# Patient Record
Sex: Female | Born: 1965 | Race: White | Hispanic: No | Smoking: Former smoker
Health system: Southern US, Community
[De-identification: ages and names within clinical notes are randomized; demographics above are authoritative.]

## PROBLEM LIST (undated history)

## (undated) DIAGNOSIS — I10 Essential (primary) hypertension: Secondary | ICD-10-CM

## (undated) DIAGNOSIS — K589 Irritable bowel syndrome without diarrhea: Secondary | ICD-10-CM

## (undated) DIAGNOSIS — M79603 Pain in arm, unspecified: Secondary | ICD-10-CM

## (undated) DIAGNOSIS — M797 Fibromyalgia: Secondary | ICD-10-CM

## (undated) DIAGNOSIS — E785 Hyperlipidemia, unspecified: Secondary | ICD-10-CM

## (undated) DIAGNOSIS — IMO0002 Reserved for concepts with insufficient information to code with codable children: Secondary | ICD-10-CM

## (undated) HISTORY — DX: Essential (primary) hypertension: I10

## (undated) HISTORY — PX: CHOLECYSTECTOMY: SHX55

## (undated) HISTORY — PX: CYST EXCISION: SHX5701

## (undated) HISTORY — DX: Hyperlipidemia, unspecified: E78.5

## (undated) HISTORY — PX: ABDOMINAL SURGERY: SHX537

## (undated) HISTORY — DX: Fibromyalgia: M79.7

## (undated) HISTORY — PX: OVARY SURGERY: SHX727

## (undated) HISTORY — PX: ABDOMINAL HYSTERECTOMY: SHX81

## (undated) HISTORY — DX: Reserved for concepts with insufficient information to code with codable children: IMO0002

## (undated) HISTORY — DX: Irritable bowel syndrome, unspecified: K58.9

## (undated) HISTORY — PX: APPENDECTOMY: SHX54

## (undated) HISTORY — DX: Pain in arm, unspecified: M79.603

---

## 1997-09-01 ENCOUNTER — Other Ambulatory Visit: Admission: RE | Admit: 1997-09-01 | Discharge: 1997-09-01 | Payer: Self-pay | Admitting: Obstetrics & Gynecology

## 1999-03-07 ENCOUNTER — Other Ambulatory Visit: Admission: RE | Admit: 1999-03-07 | Discharge: 1999-03-07 | Payer: Self-pay | Admitting: Obstetrics and Gynecology

## 1999-08-27 ENCOUNTER — Encounter: Payer: Self-pay | Admitting: Internal Medicine

## 1999-08-27 ENCOUNTER — Ambulatory Visit (HOSPITAL_COMMUNITY): Admission: RE | Admit: 1999-08-27 | Discharge: 1999-08-27 | Payer: Self-pay | Admitting: Internal Medicine

## 2000-01-21 ENCOUNTER — Encounter: Admission: RE | Admit: 2000-01-21 | Discharge: 2000-01-21 | Payer: Self-pay | Admitting: Internal Medicine

## 2000-01-21 ENCOUNTER — Encounter: Payer: Self-pay | Admitting: Internal Medicine

## 2000-01-24 ENCOUNTER — Encounter: Payer: Self-pay | Admitting: Internal Medicine

## 2000-01-24 ENCOUNTER — Inpatient Hospital Stay (HOSPITAL_COMMUNITY): Admission: AD | Admit: 2000-01-24 | Discharge: 2000-01-31 | Payer: Self-pay | Admitting: Internal Medicine

## 2000-01-26 ENCOUNTER — Encounter: Payer: Self-pay | Admitting: Internal Medicine

## 2000-04-22 ENCOUNTER — Observation Stay (HOSPITAL_COMMUNITY): Admission: RE | Admit: 2000-04-22 | Discharge: 2000-04-23 | Payer: Self-pay

## 2001-10-13 ENCOUNTER — Encounter: Admission: RE | Admit: 2001-10-13 | Discharge: 2001-10-13 | Payer: Self-pay | Admitting: Internal Medicine

## 2001-10-13 ENCOUNTER — Encounter: Payer: Self-pay | Admitting: Internal Medicine

## 2001-10-20 ENCOUNTER — Encounter: Payer: Self-pay | Admitting: Internal Medicine

## 2001-10-20 ENCOUNTER — Encounter: Admission: RE | Admit: 2001-10-20 | Discharge: 2001-10-20 | Payer: Self-pay | Admitting: Internal Medicine

## 2001-10-28 ENCOUNTER — Encounter: Admission: RE | Admit: 2001-10-28 | Discharge: 2001-12-24 | Payer: Self-pay | Admitting: Internal Medicine

## 2002-04-28 ENCOUNTER — Encounter: Payer: Self-pay | Admitting: Internal Medicine

## 2002-04-28 ENCOUNTER — Encounter: Admission: RE | Admit: 2002-04-28 | Discharge: 2002-04-28 | Payer: Self-pay | Admitting: Internal Medicine

## 2010-12-24 ENCOUNTER — Encounter: Payer: Medicare Other | Attending: Physical Medicine & Rehabilitation | Admitting: Physical Medicine & Rehabilitation

## 2010-12-24 DIAGNOSIS — G43019 Migraine without aura, intractable, without status migrainosus: Secondary | ICD-10-CM

## 2010-12-24 DIAGNOSIS — M25519 Pain in unspecified shoulder: Secondary | ICD-10-CM | POA: Insufficient documentation

## 2010-12-24 DIAGNOSIS — Z794 Long term (current) use of insulin: Secondary | ICD-10-CM | POA: Insufficient documentation

## 2010-12-24 DIAGNOSIS — M79609 Pain in unspecified limb: Secondary | ICD-10-CM | POA: Insufficient documentation

## 2010-12-24 DIAGNOSIS — M549 Dorsalgia, unspecified: Secondary | ICD-10-CM | POA: Insufficient documentation

## 2010-12-24 DIAGNOSIS — G43909 Migraine, unspecified, not intractable, without status migrainosus: Secondary | ICD-10-CM | POA: Insufficient documentation

## 2010-12-24 DIAGNOSIS — IMO0002 Reserved for concepts with insufficient information to code with codable children: Secondary | ICD-10-CM | POA: Insufficient documentation

## 2010-12-24 DIAGNOSIS — Z79899 Other long term (current) drug therapy: Secondary | ICD-10-CM | POA: Insufficient documentation

## 2010-12-24 DIAGNOSIS — M545 Low back pain, unspecified: Secondary | ICD-10-CM | POA: Insufficient documentation

## 2010-12-24 DIAGNOSIS — K589 Irritable bowel syndrome without diarrhea: Secondary | ICD-10-CM | POA: Insufficient documentation

## 2010-12-24 DIAGNOSIS — G8929 Other chronic pain: Secondary | ICD-10-CM | POA: Insufficient documentation

## 2010-12-24 DIAGNOSIS — IMO0001 Reserved for inherently not codable concepts without codable children: Secondary | ICD-10-CM

## 2010-12-24 DIAGNOSIS — M25559 Pain in unspecified hip: Secondary | ICD-10-CM | POA: Insufficient documentation

## 2010-12-24 DIAGNOSIS — M47814 Spondylosis without myelopathy or radiculopathy, thoracic region: Secondary | ICD-10-CM

## 2010-12-24 DIAGNOSIS — M47817 Spondylosis without myelopathy or radiculopathy, lumbosacral region: Secondary | ICD-10-CM

## 2010-12-26 ENCOUNTER — Other Ambulatory Visit: Payer: Self-pay | Admitting: Physical Medicine & Rehabilitation

## 2010-12-26 DIAGNOSIS — M549 Dorsalgia, unspecified: Secondary | ICD-10-CM

## 2011-01-01 ENCOUNTER — Ambulatory Visit (HOSPITAL_COMMUNITY)
Admission: RE | Admit: 2011-01-01 | Discharge: 2011-01-01 | Disposition: A | Discharge status: Home / Self Care | Payer: Medicare Other | Source: Ambulatory Visit | Attending: Physical Medicine & Rehabilitation | Admitting: Physical Medicine & Rehabilitation

## 2011-01-01 DIAGNOSIS — M549 Dorsalgia, unspecified: Secondary | ICD-10-CM

## 2011-01-01 DIAGNOSIS — M47817 Spondylosis without myelopathy or radiculopathy, lumbosacral region: Secondary | ICD-10-CM | POA: Insufficient documentation

## 2011-01-15 ENCOUNTER — Ambulatory Visit: Payer: Self-pay | Admitting: Physical Medicine & Rehabilitation

## 2011-01-21 ENCOUNTER — Ambulatory Visit: Payer: Medicare Other | Admitting: Physical Medicine & Rehabilitation

## 2011-01-29 ENCOUNTER — Encounter: Payer: Medicare Other | Attending: Physical Medicine & Rehabilitation | Admitting: Physical Medicine & Rehabilitation

## 2011-01-29 DIAGNOSIS — M25519 Pain in unspecified shoulder: Secondary | ICD-10-CM | POA: Insufficient documentation

## 2011-01-29 DIAGNOSIS — K589 Irritable bowel syndrome without diarrhea: Secondary | ICD-10-CM | POA: Insufficient documentation

## 2011-01-29 DIAGNOSIS — IMO0001 Reserved for inherently not codable concepts without codable children: Secondary | ICD-10-CM

## 2011-01-29 DIAGNOSIS — R52 Pain, unspecified: Secondary | ICD-10-CM | POA: Insufficient documentation

## 2011-01-29 DIAGNOSIS — G43909 Migraine, unspecified, not intractable, without status migrainosus: Secondary | ICD-10-CM | POA: Insufficient documentation

## 2011-01-29 DIAGNOSIS — M545 Low back pain, unspecified: Secondary | ICD-10-CM | POA: Insufficient documentation

## 2011-01-29 DIAGNOSIS — M47812 Spondylosis without myelopathy or radiculopathy, cervical region: Secondary | ICD-10-CM

## 2011-01-29 DIAGNOSIS — M533 Sacrococcygeal disorders, not elsewhere classified: Secondary | ICD-10-CM

## 2011-01-29 DIAGNOSIS — G4700 Insomnia, unspecified: Secondary | ICD-10-CM

## 2011-01-29 DIAGNOSIS — M542 Cervicalgia: Secondary | ICD-10-CM | POA: Insufficient documentation

## 2011-01-30 NOTE — Assessment & Plan Note (Signed)
Shannon French is back regarding her diffuse pain.  We ordered MRIs of her lumbar and thoracic spine, which showed some degenerative disk disease in the thoracic segments but really unremarkable lumbar spine.  The Flexeril helped her get to sleep a bit better but did not improve the quality of her sleep.  She is not using her hydrocodone at night to assist her rest.  She restarted some DHEA which helped her a bit with energy in the mornings but was not tremendously beneficial.  She remains on her fentanyl patch per Dr. Egbert Garibaldi.  UDS was positive for marijuana metabolite.  The patient had been open about that prior to the test.  The patient reports pain in neck and shoulders as well as especially the low back.  She has pain worsened with increased activity.  She is trying to exercise and walks 20 minutes on a good day.  REVIEW OF SYSTEMS:  Notable for multiple items.  Full 12-point review is in the written health and history section of the chart.  SOCIAL HISTORY:  The patient lives alone, is unchanged.  PHYSICAL EXAMINATION:  VITAL SIGNS:  Blood pressure is 126/77, pulse 81, respiratory rate is 18, and she is saturating 98% on room air. GENERAL:  The patient is pleasant and alert. MUSCULOSKELETAL:  Posture is fair.  She is diffusely tender over the cervical thoracic musculature.  There is a lot of tight musculature, but had hard time finding focal trigger points today.  Honestly, she is too sensitive to really palpate deep enough to discern these.  She was painful both PSIS areas.  Compression testing as well as Luisa Hart testing was negative.  She had discomfort with range of motion in the lumbar spine in all planes.  Strength is generally 5/5 in all 4 limbs with normal sensory function.  ASSESSMENT: 1. Likely fibromyalgia syndrome. 2. Low back pain, most consistent with sacroiliac joint pathology. 3. Cervical myofascial pain. 4. History of migraine headaches. 5. Irritable bowel  syndrome.  PLAN: 1. We initiated to Cymbalta which she certainly can take with her     other migraine meds.  It actually may help her symptoms there as     well.  Begin 30 mg daily, increase to 60 mg daily thereafter. 2. I refilled her fentanyl patch 50 mcg q.72 h.  She will continue     with her hydrocodone for breakthrough pain.  I encouraged her to     use this at night occasionally to help assist with her sleep as     pain is the fact that awakens her. 3. We will trial Zanaflex 2 mg to 4 mg at bedtime for sleep and muscle     pain. 4. We will refer to outpatient physical therapy for cervical/shoulder     girdle myofascial techniques, release, massage, range of motion,     posture, and strengthening.  Could expand this to focus on her SI     joints depending upon results with her injections. 5. She will continue the DHEA for now. 6. We will speak with Dr. Wynn Banker regarding her injections as she     does not want steroids given her history of     diabetes and severe elevations of her sugars after prior injections     she has had before. 7. I will see her back pending injection series by Dr. Wynn Banker.     Ranelle Oyster, M.D. Electronically Signed    ZTS/MedQ D:  01/29/2011 13:32:03  T:  01/30/2011  01:04:03  Job #:  [id number]  cc:   Cheri Rous, MD Fax: [phone]

## 2011-02-18 ENCOUNTER — Ambulatory Visit: Payer: Medicare Other | Admitting: Physical Therapy

## 2011-02-20 ENCOUNTER — Ambulatory Visit: Payer: Medicare Other | Admitting: Physical Therapy

## 2011-02-21 ENCOUNTER — Ambulatory Visit: Payer: Medicare Other | Attending: Physical Medicine & Rehabilitation

## 2011-02-21 DIAGNOSIS — M2550 Pain in unspecified joint: Secondary | ICD-10-CM | POA: Insufficient documentation

## 2011-02-21 DIAGNOSIS — R293 Abnormal posture: Secondary | ICD-10-CM | POA: Insufficient documentation

## 2011-02-21 DIAGNOSIS — IMO0001 Reserved for inherently not codable concepts without codable children: Secondary | ICD-10-CM | POA: Insufficient documentation

## 2011-02-21 DIAGNOSIS — M2560 Stiffness of unspecified joint, not elsewhere classified: Secondary | ICD-10-CM | POA: Insufficient documentation

## 2011-03-04 ENCOUNTER — Ambulatory Visit (HOSPITAL_BASED_OUTPATIENT_CLINIC_OR_DEPARTMENT_OTHER): Payer: Medicare Other | Admitting: Physical Medicine & Rehabilitation

## 2011-03-04 ENCOUNTER — Encounter: Payer: Medicare Other | Attending: Physical Medicine & Rehabilitation

## 2011-03-04 DIAGNOSIS — M79609 Pain in unspecified limb: Secondary | ICD-10-CM | POA: Insufficient documentation

## 2011-03-04 DIAGNOSIS — M545 Low back pain, unspecified: Secondary | ICD-10-CM | POA: Insufficient documentation

## 2011-03-04 DIAGNOSIS — G8929 Other chronic pain: Secondary | ICD-10-CM | POA: Insufficient documentation

## 2011-03-04 DIAGNOSIS — M549 Dorsalgia, unspecified: Secondary | ICD-10-CM | POA: Insufficient documentation

## 2011-03-04 DIAGNOSIS — Z79899 Other long term (current) drug therapy: Secondary | ICD-10-CM | POA: Insufficient documentation

## 2011-03-04 DIAGNOSIS — IMO0002 Reserved for concepts with insufficient information to code with codable children: Secondary | ICD-10-CM | POA: Insufficient documentation

## 2011-03-04 DIAGNOSIS — Z794 Long term (current) use of insulin: Secondary | ICD-10-CM | POA: Insufficient documentation

## 2011-03-04 DIAGNOSIS — M25519 Pain in unspecified shoulder: Secondary | ICD-10-CM | POA: Insufficient documentation

## 2011-03-04 DIAGNOSIS — M25559 Pain in unspecified hip: Secondary | ICD-10-CM | POA: Insufficient documentation

## 2011-03-04 DIAGNOSIS — G43909 Migraine, unspecified, not intractable, without status migrainosus: Secondary | ICD-10-CM | POA: Insufficient documentation

## 2011-03-04 DIAGNOSIS — M533 Sacrococcygeal disorders, not elsewhere classified: Secondary | ICD-10-CM

## 2011-03-04 DIAGNOSIS — K589 Irritable bowel syndrome without diarrhea: Secondary | ICD-10-CM | POA: Insufficient documentation

## 2011-03-04 NOTE — Procedures (Signed)
[name], Shannon French NO.:  [id number]  MEDICAL RECORD NO.:  [id number]           PATIENT TYPE:  O  LOCATION:  [facility]                          FACILITY:  [facility]  PHYSICIAN:  Erick Colace, M.D.DATE OF BIRTH:  Jun 19, 1966  DATE OF PROCEDURE:  03/04/2011 DATE OF DISCHARGE:                              OPERATIVE REPORT  PROCEDURE:  This is a sacroiliac joint injection under fluoroscopic guidance.  INDICATION:  Right sacroiliac distribution pain, also has left side pain but the right side is worse today.  Pain is only partially response to medication management including narcotic analgesics and interferes with walking, bending, sitting, standing, and is rated 7/10.  Informed consent was obtained after describing risks and benefits of the procedure with the patient.  These include bleeding, bruising and infection.  She elects to proceed and has given written consent.  The patient was placed prone on fluoroscopy table.  Betadine prep, sterile drape, 25-gauge inch and half needle was used to anesthetize skin and subcutaneous tissue with 1% lidocaine x2 mL.  Then, 25-gauge 3 inches spinal needle was inserted under fluoroscopic guidance, starting the right sacroiliac joint.  AP and lateral images utilized.  Omnipaque 180 under live fluoro demonstrated no intravascular uptake.  Good SI joint spread followed by injection of 1.5 mL of 40 mg/mL Depo-Medrol and 1 mL of 2% MPF lidocaine.  The patient tolerated the procedure well.  Pre- and post-injection vitals stable.  Post injection instructions given.     Erick Colace, M.D. Electronically Signed    AEK/MEDQ  D:  03/04/2011 14:34:48  T:  03/04/2011 17:06:51  Job:  [id number]

## 2011-03-07 ENCOUNTER — Ambulatory Visit: Payer: Medicare Other | Attending: Physical Medicine & Rehabilitation | Admitting: Physical Therapy

## 2011-03-07 DIAGNOSIS — IMO0001 Reserved for inherently not codable concepts without codable children: Secondary | ICD-10-CM | POA: Insufficient documentation

## 2011-03-07 DIAGNOSIS — M2550 Pain in unspecified joint: Secondary | ICD-10-CM | POA: Insufficient documentation

## 2011-03-07 DIAGNOSIS — R293 Abnormal posture: Secondary | ICD-10-CM | POA: Insufficient documentation

## 2011-03-07 DIAGNOSIS — M2560 Stiffness of unspecified joint, not elsewhere classified: Secondary | ICD-10-CM | POA: Insufficient documentation

## 2011-03-12 ENCOUNTER — Ambulatory Visit: Payer: Medicare Other | Admitting: Physical Therapy

## 2011-03-14 ENCOUNTER — Ambulatory Visit: Payer: Medicare Other | Admitting: Physical Therapy

## 2011-03-19 ENCOUNTER — Ambulatory Visit: Payer: Medicare Other | Admitting: Physical Therapy

## 2011-03-20 ENCOUNTER — Encounter: Payer: Medicare Other | Admitting: Physical Therapy

## 2011-03-26 ENCOUNTER — Ambulatory Visit: Payer: Medicare Other | Admitting: Physical Therapy

## 2011-03-28 ENCOUNTER — Ambulatory Visit: Payer: Medicare Other | Admitting: Physical Therapy

## 2011-04-01 ENCOUNTER — Encounter (HOSPITAL_BASED_OUTPATIENT_CLINIC_OR_DEPARTMENT_OTHER): Payer: Medicare Other | Admitting: Physical Medicine & Rehabilitation

## 2011-04-01 ENCOUNTER — Encounter: Payer: Medicare Other | Attending: Physical Medicine & Rehabilitation

## 2011-04-01 DIAGNOSIS — IMO0002 Reserved for concepts with insufficient information to code with codable children: Secondary | ICD-10-CM | POA: Insufficient documentation

## 2011-04-01 DIAGNOSIS — Z794 Long term (current) use of insulin: Secondary | ICD-10-CM | POA: Insufficient documentation

## 2011-04-01 DIAGNOSIS — K589 Irritable bowel syndrome without diarrhea: Secondary | ICD-10-CM | POA: Insufficient documentation

## 2011-04-01 DIAGNOSIS — G8929 Other chronic pain: Secondary | ICD-10-CM | POA: Insufficient documentation

## 2011-04-01 DIAGNOSIS — M79609 Pain in unspecified limb: Secondary | ICD-10-CM | POA: Insufficient documentation

## 2011-04-01 DIAGNOSIS — M25559 Pain in unspecified hip: Secondary | ICD-10-CM | POA: Insufficient documentation

## 2011-04-01 DIAGNOSIS — M545 Low back pain, unspecified: Secondary | ICD-10-CM | POA: Insufficient documentation

## 2011-04-01 DIAGNOSIS — M549 Dorsalgia, unspecified: Secondary | ICD-10-CM | POA: Insufficient documentation

## 2011-04-01 DIAGNOSIS — Z79899 Other long term (current) drug therapy: Secondary | ICD-10-CM | POA: Insufficient documentation

## 2011-04-01 DIAGNOSIS — G43909 Migraine, unspecified, not intractable, without status migrainosus: Secondary | ICD-10-CM | POA: Insufficient documentation

## 2011-04-01 DIAGNOSIS — M25519 Pain in unspecified shoulder: Secondary | ICD-10-CM | POA: Insufficient documentation

## 2011-04-01 DIAGNOSIS — M533 Sacrococcygeal disorders, not elsewhere classified: Secondary | ICD-10-CM

## 2011-04-03 NOTE — Procedures (Signed)
NAMEREAGHAN, KAWA NO.:  [id number]  MEDICAL RECORD NO.:  [id number]           PATIENT TYPE:  LOCATION:                                 FACILITY:  PHYSICIAN:  Erick Colace, M.D.DATE OF BIRTH:  July 23, 1966  DATE OF PROCEDURE:  04/01/2011 DATE OF DISCHARGE:                              OPERATIVE REPORT  PROCEDURE:  Bilateral sacroiliac injection under fluoroscopic guidance.  INDICATION:  Lumbosacral pain.  She has had good relief of two and half weeks with right-sided sacroiliac injection performed approximately 1 month ago.  Here for bilateral.  Also will be using Marcaine rather than lidocaine.  Pain is only partially response to medication management including narcotic analgesics, interferes with mobility, averaging 8/10.  Informed consent was obtained after describing risks and benefits of the procedure with the patient.  These include bleeding, bruising, and infection.  She elects to proceed and has given written consent, time- out taken for a proper patient and proper procedure.  The patient was placed prone on fluoroscopy table.  Betadine prep, sterile drape, 25-gauge inch and half needle was used to anesthetize skin and subcu tissue with 1% lidocaine x2 mL.  Then, a 25-gauge 3-inch spinal needle was inserted under fluoroscopic guidance into the left SI joint.  AP, lateral, and oblique images were utilized.  Omnipaque 180 x 0.5 mL demonstrated no intravascular uptake followed by injection of 1 mL of Marcaine and 0.5 mL of 40 mg/mL Depo-Medrol.  The same procedure was repeated on the right side using same needle injectate and technique.  The patient tolerated the procedure well.  Postprocedure instructions were given.     Erick Colace, M.D. Electronically Signed    AEK/MEDQ  D:  04/01/2011 12:09:26  T:  04/01/2011 15:12:17  Job:  [id number]

## 2011-04-04 ENCOUNTER — Ambulatory Visit: Payer: Medicare Other | Admitting: Physical Therapy

## 2011-04-16 ENCOUNTER — Ambulatory Visit: Payer: Medicare Other | Attending: Physical Medicine & Rehabilitation | Admitting: Physical Therapy

## 2011-04-16 DIAGNOSIS — M2560 Stiffness of unspecified joint, not elsewhere classified: Secondary | ICD-10-CM | POA: Insufficient documentation

## 2011-04-16 DIAGNOSIS — IMO0001 Reserved for inherently not codable concepts without codable children: Secondary | ICD-10-CM | POA: Insufficient documentation

## 2011-04-16 DIAGNOSIS — R293 Abnormal posture: Secondary | ICD-10-CM | POA: Insufficient documentation

## 2011-04-16 DIAGNOSIS — M2550 Pain in unspecified joint: Secondary | ICD-10-CM | POA: Insufficient documentation

## 2011-04-18 ENCOUNTER — Ambulatory Visit: Payer: Medicare Other | Admitting: Physical Therapy

## 2011-04-23 ENCOUNTER — Encounter: Payer: Medicare Other | Admitting: Physical Therapy

## 2011-04-25 ENCOUNTER — Encounter: Payer: Medicare Other | Admitting: Physical Therapy

## 2011-05-03 ENCOUNTER — Ambulatory Visit: Payer: Medicare Other | Admitting: Physical Medicine & Rehabilitation

## 2011-06-05 ENCOUNTER — Encounter: Payer: Medicare Other | Attending: Physical Medicine & Rehabilitation | Admitting: Physical Medicine & Rehabilitation

## 2011-06-05 DIAGNOSIS — M533 Sacrococcygeal disorders, not elsewhere classified: Secondary | ICD-10-CM | POA: Insufficient documentation

## 2011-06-05 DIAGNOSIS — IMO0001 Reserved for inherently not codable concepts without codable children: Secondary | ICD-10-CM | POA: Insufficient documentation

## 2011-06-05 DIAGNOSIS — M545 Low back pain, unspecified: Secondary | ICD-10-CM | POA: Insufficient documentation

## 2011-06-05 DIAGNOSIS — G894 Chronic pain syndrome: Secondary | ICD-10-CM | POA: Insufficient documentation

## 2011-06-05 NOTE — Assessment & Plan Note (Signed)
HISTORY:  Shannon French is here in followup of her chronic low back pain.  She had a series of SI joint blocks by Dr. Wynn Banker and first at last 2-3 weeks.  Second set last only a week.  With the injections her pain diminished substantially at least 75-80% less than her baseline. However, when the injections "wore off" her pain is returned and been 9 to 10/10.  She has problems bending or sitting for long periods of time. She has a hard time standing up once she is sitting.  Pain usually in the low back radiating over the buttocks and to the posterior thigh. Pain is stabbing, burning, and aching.  She has been on fentanyl patches for some time as well as hydrocodone for breakthrough pain.  We will use some Zanaflex but she could not tolerate this.  She also uses Cymbalta. She has used some antiinflammatories in the past but had to back off. She states that the naproxen she used previously "made her sleepy."  REVIEW OF SYSTEMS:  Notable for the above.  Does report some intermittent diarrhea, constipation, numbness, tingling, and spasms. Full 12-point review is in the written health and history section of the chart.  SOCIAL HISTORY:  Unchanged.  PHYSICAL EXAMINATION:  VITAL SIGNS:  Blood pressure is 130/70, pulse 76, respiratory rate 14, and she is satting 98% on room air. GENERAL:  The patient is pleasant, but does appear to be in distress. She had a hard time sitting down today.  She tends to stand and walk pace around the room a bit.  She is significantly tender at both PSIS areas as well as in the lower lumbosacral spine and paraspinal musculatures.  Luisa Hart testing was positive.  She has some pain with palpation over the greater trochanter region.  Bending cause substantial pain beyond 30-40 degrees.  Extension was painful but not nearly as significant as flexion.  Rotation and lateral bending does cause moderate pain.  Strength is grossly 4-5/5 with pain inhibition proximally noted.  No  sensory findings were seen and reflexes are 1+. Straight leg testing was equivocal to positive.  ASSESSMENT: 1. 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  She has responded modalities and therapy in that     area. 2. Bilateral sacral ileitis.  She has responded temporarily with good     results two SI joint blocks 3. Chronic pain syndrome which is potentially consistent with     fibromyalgia like syndrome.  PLAN: 1. I would like to send the patient back to Dr. Wynn Banker for RFs of     the bilateral SI joints. 2. We will switch her long-acting opiate to Opana ER as I do not think     the fentanyl has been efficacious for her.  We will try 10 mg q.12     hours and titrate up as needed. 3. Stay with Cymbalta as previously dosed. 4. We will initiate Mobic 15 mg p.o. q.a.m. for her low back/SI joint     pain. 5. Look at another anticonvulsant for treatment of her generalized     fibromyalgia/central pain symptoms.  She is on Topamax at least     still.  Lyrica may be one possibility. 6. I will see her back, pending injections above.     Ranelle Oyster, M.D. Electronically Signed    ZTS/MedQ D:  06/05/2011 14:46:05  T:  06/05/2011 16:30:31  Job #:  [id number]

## 2011-06-06 DIAGNOSIS — M47814 Spondylosis without myelopathy or radiculopathy, thoracic region: Secondary | ICD-10-CM

## 2011-06-06 DIAGNOSIS — M533 Sacrococcygeal disorders, not elsewhere classified: Secondary | ICD-10-CM

## 2011-06-06 DIAGNOSIS — IMO0001 Reserved for inherently not codable concepts without codable children: Secondary | ICD-10-CM

## 2011-06-06 DIAGNOSIS — G43019 Migraine without aura, intractable, without status migrainosus: Secondary | ICD-10-CM

## 2011-07-01 ENCOUNTER — Ambulatory Visit: Payer: Medicare Other | Admitting: Physical Medicine & Rehabilitation

## 2011-07-18 ENCOUNTER — Encounter: Payer: Medicare Other | Attending: Physical Medicine & Rehabilitation

## 2011-07-18 ENCOUNTER — Ambulatory Visit (HOSPITAL_BASED_OUTPATIENT_CLINIC_OR_DEPARTMENT_OTHER): Payer: Medicare Other | Admitting: Physical Medicine & Rehabilitation

## 2011-07-18 DIAGNOSIS — M545 Low back pain, unspecified: Secondary | ICD-10-CM | POA: Insufficient documentation

## 2011-07-18 DIAGNOSIS — G894 Chronic pain syndrome: Secondary | ICD-10-CM | POA: Insufficient documentation

## 2011-07-18 DIAGNOSIS — M533 Sacrococcygeal disorders, not elsewhere classified: Secondary | ICD-10-CM

## 2011-07-18 DIAGNOSIS — IMO0001 Reserved for inherently not codable concepts without codable children: Secondary | ICD-10-CM | POA: Insufficient documentation

## 2011-07-18 DIAGNOSIS — M47817 Spondylosis without myelopathy or radiculopathy, lumbosacral region: Secondary | ICD-10-CM

## 2011-07-18 NOTE — Procedures (Signed)
NAMECHARICE, Shannon French                ACCOUNT NO.:  [id number]  MEDICAL RECORD NO.:  [id number]           PATIENT TYPE:  O  LOCATION:  [facility]                          FACILITY:  [facility]  PHYSICIAN:  Erick Colace, M.D.DATE OF BIRTH:  October 01, 1965  DATE OF PROCEDURE:  07/18/2011 DATE OF DISCHARGE:                              OPERATIVE REPORT  PROCEDURE:  This is a right L5 dorsal ramus, right S1-S2 dorsal ramus, and right L4 medial branch radiofrequency neurotomy under fluoroscopic guidance.  INDICATION:  Bilateral low back buttock pain inferior to L5.  Pain is only partially response to medication management and interferes with walking, sitting, standing, meal prep, household duties, and shopping, and does not respond adequately to narcotic analgesics and other conservative care.  Informed consent was obtained after describing risks and benefits of the procedure with the patient.  These include bleeding, bruising, and infection.  She elects to proceed and has given written consent.  The patient was placed prone on fluoroscopy table.  Betadine prep, sterile drape.  The proper patient, procedure confirmed, marked and prepped with Betadine, alcohol, 25-gauge inch and half needle was used to anesthetize skin and subcu tissue 2 mL of lidocaine into each 4 sites.  A 20-gauge 10 cm RF needle with a 10 mm curved active tip was inserted under fluoroscopic guidance starting first right L5 SAP transverse process junction, bone contact made, confirmed with lateral imaging.  Sensory stim at 50 Hz followed by motor stim at 2 Hz, confirmed proper needle location, followed by injection of 1 mL of solution containing 1 mL of 4 mg/mL dexamethasone and 3 mL of 1% MPF lidocaine.  Then, the right S1 SAP sacral ala junction targeted, bone contact made, confirmed with lateral imaging.  Sensory stim at 50 Hz followed by motor stim at 2 Hz, confirmed proper needle location, followed by injection 1 mL  of dexamethasone-lidocaine solution and then radiofrequency lesioning 70 degrees Celsius for 90 seconds.  Then, the lateral aspects of the right S1 and right S2, sacral foramen targeted, bone contact made.  Sensory stim at 50 Hz followed by motor stim at 2 Hz, confirmed proper needle location in each site followed by injection 1 mL of dexamethasone- lidocaine solution and radiofrequency lesioning 70 degrees Celsius for 70 seconds.  The patient tolerated procedure well.  Postprocedure instructions given.  She is to take gabapentin 1800 mg after the injection today.  She will have the left side done in 1 month.  She will take Valium 10 mg prior to the injection.     Erick Colace, M.D. Electronically Signed    AEK/MEDQ  D:  07/18/2011 10:58:03  T:  07/18/2011 11:43:13  Job:  [id number]

## 2011-08-19 ENCOUNTER — Encounter: Payer: Medicare Other | Attending: Physical Medicine & Rehabilitation

## 2011-08-19 ENCOUNTER — Encounter (HOSPITAL_BASED_OUTPATIENT_CLINIC_OR_DEPARTMENT_OTHER): Payer: Medicare Other | Admitting: Physical Medicine & Rehabilitation

## 2011-08-19 DIAGNOSIS — M533 Sacrococcygeal disorders, not elsewhere classified: Secondary | ICD-10-CM | POA: Insufficient documentation

## 2011-08-19 DIAGNOSIS — M545 Low back pain, unspecified: Secondary | ICD-10-CM | POA: Insufficient documentation

## 2011-08-19 DIAGNOSIS — IMO0001 Reserved for inherently not codable concepts without codable children: Secondary | ICD-10-CM | POA: Insufficient documentation

## 2011-08-19 DIAGNOSIS — G894 Chronic pain syndrome: Secondary | ICD-10-CM | POA: Insufficient documentation

## 2011-08-19 NOTE — Procedures (Signed)
[name], French NO.:  [id number]  MEDICAL RECORD NO.:  [id number]           PATIENT TYPE:  O  LOCATION:  [facility]                          FACILITY:  [facility]  PHYSICIAN:  Erick Colace, M.D.DATE OF BIRTH:  Nov 27, 1965  DATE OF PROCEDURE: DATE OF DISCHARGE:                              OPERATIVE REPORT  INDICATION:  Sacroiliac pain confirmed by 2 sets of SI injections.  She had good relief with the right SI radiofrequency last month, here for left side.  Informed consent was obtained after describing risks and benefits of the procedure including bleeding, bruising, and infection.  She elects to proceed and has given written consent.  The patient was placed prone on fluoroscopy table.  Betadine prep, sterile drape, 25-gauge inch and half needle was used to anesthetize skin and subcu tissue with 1% lidocaine x2 mL at each of 4 sites.  Then, a 20-gauge 10-cm RF needle with 10-mm curved active tip was inserted under fluoroscopic guidance, first starting at left L5 SAP transverse process junction, bone contact made, confirmed with lateral imaging.  Sensory stem at 50 Hz followed by motor stem at 2 Hz, confirmed proper needle location, followed by radiofrequency lesioning at 80 degrees Celsius for 90 seconds.  Then, the left S1 SAP sacroiliac junction, bone contact made, confirmed with lateral imaging.  Sensory stem at 50 Hz followed by motor stem at 2 Hz, confirmed proper needle location, followed by injection 1 mL of solution containing 1 mL of 4 mg/mL dexamethasone and 3 mL of 1% lidocaine. Then, the left S1 foramen lateral aspect was targeted, bone contact made.  Needle was laid along just lateral to the foramen.  Sensory stem at 50 Hz, followed by motor stem at 2 Hz, confirmed proper needle location, followed by injection of 1 mL of dexamethasone and lidocaine solution and radiofrequency lesioning at 80 degrees Celsius for 90 seconds.  Then, the left S2  foramen was targeted, lateral aspect. Needle was laid along the lateral aspect of S2, sensory stem at 50 Hz, followed by motor stem at 2 Hz, confirmed proper needle location, followed by injection 1 mL of dexamethasone and lidocaine solution and radiofrequency lesioning 80 degrees Celsius for 90 seconds.  The patient tolerated the procedure well.  Postprocedure instructions given.     Erick Colace, M.D. Electronically Signed    AEK/MEDQ  D:  08/19/2011 10:46:39  T:  08/19/2011 11:26:40  Job:  [id number]

## 2011-09-18 ENCOUNTER — Encounter: Payer: Medicare Other | Attending: Physical Medicine & Rehabilitation | Admitting: Physical Medicine & Rehabilitation

## 2011-09-18 DIAGNOSIS — M533 Sacrococcygeal disorders, not elsewhere classified: Secondary | ICD-10-CM | POA: Insufficient documentation

## 2011-09-18 DIAGNOSIS — R209 Unspecified disturbances of skin sensation: Secondary | ICD-10-CM | POA: Insufficient documentation

## 2011-09-18 DIAGNOSIS — E119 Type 2 diabetes mellitus without complications: Secondary | ICD-10-CM | POA: Insufficient documentation

## 2011-09-18 DIAGNOSIS — IMO0001 Reserved for inherently not codable concepts without codable children: Secondary | ICD-10-CM

## 2011-09-18 DIAGNOSIS — G43019 Migraine without aura, intractable, without status migrainosus: Secondary | ICD-10-CM

## 2011-09-18 DIAGNOSIS — M47814 Spondylosis without myelopathy or radiculopathy, thoracic region: Secondary | ICD-10-CM

## 2011-09-18 DIAGNOSIS — G894 Chronic pain syndrome: Secondary | ICD-10-CM | POA: Insufficient documentation

## 2011-09-18 DIAGNOSIS — M47817 Spondylosis without myelopathy or radiculopathy, lumbosacral region: Secondary | ICD-10-CM

## 2011-09-19 NOTE — Assessment & Plan Note (Signed)
Shannon French is back regarding her pain.  She has had good results with the bilateral SI RFs.  She has had much less pain in general, really which is only at 5-6/10 currently.  She complains of dysesthesias in both legs, which she feels maybe secondary to her diabetes.  She remains on Opana and hydrocodone for breakthrough symptoms.  She used the gabapentin briefly after the injection, which made her sleepy, although she was given 600 three times a day.  She is on Topamax chronically for migraine prevention.  REVIEW OF SYSTEMS:  Notable for the above.  Full 12-point review is in the written health and history section of the chart.  She states that her sugars are generally fairly well controlled and rarely over 150. She states her most recent hemoglobin A1c was 7.3.  SOCIAL HISTORY:  Unchanged.  PHYSICAL EXAMINATION:  VITAL SIGNS:  Blood pressure is 100/69, pulse 93, respiratory rate 16, and she is saturating 94% on room air. GENERAL:  The patient is generally pleasant, alert. MUSCULOSKELETAL:  She had some pain in the low back with flexion and mildly with extension today.  Pelvic range of motion was functional. She had minimal tenderness with palpation over the gluteal regions into the greater trochanter areas.  Strength is 5/5 in both legs.  Normal sensory function today.  Sensory exam is grossly intact in both limbs. Upper extremity exam was intact.  ASSESSMENT: 1. Bilateral sacroiliac joint dysfunction, status post radio     frequencies which were extremely helpful. 2. Chronic pain syndrome consistent with fibromyalgia. 3. Type 2 diabetes.  PLAN: 1. I discussed the importance of diabetes management and any potential     neuropathy.  Apparently, she has had nerve conduction studies in     the past which were negative.  I did tell her that fibromyalgia     sometimes can present like a peripheral neuropathy, particularly in     the lower extremities. 2. I discussed appropriate  stretching and exercises.  She has a good     program at home which she can follow. 3. I would like her to wean down on the hydrocodone a bit and see if     she can get to a once or twice a day schedule as needed.  She will     stay with the Opana ER 10 mg for now. 4. We will introduce Neurontin 300 mg at bedtime only, then observe     for effects on pain in her feet as well as tolerance. 5. I will see her back in 3 months with nursing and 1 month with me.     Ranelle Oyster, M.D. Electronically Signed    ZTS/MedQ D:  09/18/2011 10:25:13  T:  09/19/2011 00:31:50  Job #:  [id number]

## 2011-09-25 ENCOUNTER — Telehealth: Payer: Self-pay | Admitting: Physical Medicine & Rehabilitation

## 2011-09-25 NOTE — Telephone Encounter (Signed)
Gabapentin-has been on before in tablet form and did not have any problems.  Now taking capsules, having reactions, glands, throat, lips and tongue swollen.  Perhaps the dye in the capsule, maybe she shouldn't take generic.  Will not take anymore capsules.  Please advise.

## 2011-09-26 MED ORDER — GABAPENTIN 600 MG PO TABS: 600.0000 mg | ORAL_TABLET | Freq: Two times a day (BID) | ORAL | Status: DC

## 2011-09-26 NOTE — Telephone Encounter (Signed)
I offered to resend the rx to the pharmacy for the patient so that she can take the tablet instead of the capsule. Pt agrees.

## 2011-10-15 ENCOUNTER — Encounter: Payer: Medicare Other | Attending: Physical Medicine & Rehabilitation | Admitting: *Deleted

## 2011-10-15 ENCOUNTER — Encounter: Payer: Self-pay | Admitting: *Deleted

## 2011-10-15 VITALS — BP 142/84 | HR 98 | Resp 18 | Ht 64.0 in | Wt 145.0 lb

## 2011-10-15 DIAGNOSIS — E119 Type 2 diabetes mellitus without complications: Secondary | ICD-10-CM | POA: Insufficient documentation

## 2011-10-15 DIAGNOSIS — M47814 Spondylosis without myelopathy or radiculopathy, thoracic region: Secondary | ICD-10-CM

## 2011-10-15 DIAGNOSIS — M533 Sacrococcygeal disorders, not elsewhere classified: Secondary | ICD-10-CM | POA: Insufficient documentation

## 2011-10-15 DIAGNOSIS — M47817 Spondylosis without myelopathy or radiculopathy, lumbosacral region: Secondary | ICD-10-CM

## 2011-10-15 DIAGNOSIS — IMO0001 Reserved for inherently not codable concepts without codable children: Secondary | ICD-10-CM | POA: Insufficient documentation

## 2011-10-15 DIAGNOSIS — G894 Chronic pain syndrome: Secondary | ICD-10-CM | POA: Insufficient documentation

## 2011-10-15 MED ORDER — TIZANIDINE HCL 2 MG PO CAPS: 2.0000 mg | ORAL_CAPSULE | Freq: Every evening | ORAL | Status: DC | PRN

## 2011-10-15 MED ORDER — OXYMORPHONE HCL ER 10 MG PO T12A: 10.0000 mg | EXTENDED_RELEASE_TABLET | Freq: Two times a day (BID) | ORAL | Status: DC

## 2011-10-15 MED ORDER — MELOXICAM 15 MG PO TABS: 15.0000 mg | ORAL_TABLET | Freq: Every day | ORAL | Status: DC

## 2011-10-15 MED ORDER — HYDROCODONE-ACETAMINOPHEN 5-500 MG PO TABS: 1.0000 | ORAL_TABLET | Freq: Three times a day (TID) | ORAL | Status: DC | PRN

## 2011-10-15 NOTE — Progress Notes (Signed)
Shannon French states she has "good days and bad days". No questions voiced. States keeps meds in safe place. States had facial swelling and some throat tightness when she started taking gabapentin. She suspects the dye is what she is sensitive to. She had since taken half of a capsule at a time without incident.

## 2011-11-11 ENCOUNTER — Encounter: Payer: Self-pay | Admitting: Physical Medicine & Rehabilitation

## 2011-11-13 ENCOUNTER — Encounter: Payer: Medicare Other | Attending: Physical Medicine & Rehabilitation | Admitting: *Deleted

## 2011-11-13 ENCOUNTER — Encounter: Payer: Self-pay | Admitting: *Deleted

## 2011-11-13 VITALS — BP 131/92 | HR 96 | Resp 18 | Ht 64.0 in | Wt 144.0 lb

## 2011-11-13 DIAGNOSIS — G894 Chronic pain syndrome: Secondary | ICD-10-CM | POA: Insufficient documentation

## 2011-11-13 DIAGNOSIS — Z79899 Other long term (current) drug therapy: Secondary | ICD-10-CM | POA: Insufficient documentation

## 2011-11-13 DIAGNOSIS — M533 Sacrococcygeal disorders, not elsewhere classified: Secondary | ICD-10-CM | POA: Insufficient documentation

## 2011-11-13 DIAGNOSIS — M47814 Spondylosis without myelopathy or radiculopathy, thoracic region: Secondary | ICD-10-CM

## 2011-11-13 DIAGNOSIS — IMO0001 Reserved for inherently not codable concepts without codable children: Secondary | ICD-10-CM

## 2011-11-13 DIAGNOSIS — M47817 Spondylosis without myelopathy or radiculopathy, lumbosacral region: Secondary | ICD-10-CM

## 2011-11-13 DIAGNOSIS — E119 Type 2 diabetes mellitus without complications: Secondary | ICD-10-CM | POA: Insufficient documentation

## 2011-11-13 MED ORDER — OXYMORPHONE HCL ER 10 MG PO T12A: 10.0000 mg | EXTENDED_RELEASE_TABLET | Freq: Two times a day (BID) | ORAL | Status: DC

## 2011-11-13 MED ORDER — HYDROCODONE-ACETAMINOPHEN 5-500 MG PO TABS: 1.0000 | ORAL_TABLET | Freq: Three times a day (TID) | ORAL | Status: DC | PRN

## 2011-11-13 NOTE — Progress Notes (Signed)
Reports 3 falls in last month. Her legs sometimes are weak, or give out on her. No injuries incurred. Describes very busy Easter holiday with many house guests. Pill counts low -- denies being aware she took too many.

## 2011-11-14 ENCOUNTER — Encounter: Payer: Self-pay | Admitting: Physical Medicine & Rehabilitation

## 2011-11-14 ENCOUNTER — Other Ambulatory Visit: Payer: Self-pay | Admitting: Physical Medicine & Rehabilitation

## 2011-12-13 ENCOUNTER — Encounter: Payer: Medicare Other | Attending: Physical Medicine & Rehabilitation | Admitting: Physical Medicine & Rehabilitation

## 2011-12-13 ENCOUNTER — Encounter: Payer: Self-pay | Admitting: Physical Medicine & Rehabilitation

## 2011-12-13 VITALS — BP 138/94 | HR 90 | Ht 63.0 in | Wt 144.0 lb

## 2011-12-13 DIAGNOSIS — IMO0001 Reserved for inherently not codable concepts without codable children: Secondary | ICD-10-CM

## 2011-12-13 DIAGNOSIS — M461 Sacroiliitis, not elsewhere classified: Secondary | ICD-10-CM

## 2011-12-13 DIAGNOSIS — M47817 Spondylosis without myelopathy or radiculopathy, lumbosacral region: Secondary | ICD-10-CM | POA: Insufficient documentation

## 2011-12-13 DIAGNOSIS — M797 Fibromyalgia: Secondary | ICD-10-CM | POA: Insufficient documentation

## 2011-12-13 DIAGNOSIS — IMO0002 Reserved for concepts with insufficient information to code with codable children: Secondary | ICD-10-CM | POA: Insufficient documentation

## 2011-12-13 MED ORDER — HYDROCODONE-ACETAMINOPHEN 5-500 MG PO TABS: 1.0000 | ORAL_TABLET | Freq: Three times a day (TID) | ORAL | Status: DC | PRN

## 2011-12-13 MED ORDER — OXYMORPHONE HCL ER 10 MG PO T12A: 10.0000 mg | EXTENDED_RELEASE_TABLET | Freq: Two times a day (BID) | ORAL | Status: DC

## 2011-12-13 NOTE — Patient Instructions (Addendum)
Call me if you have any further problems falling  Use your meloxicam as needed

## 2011-12-13 NOTE — Progress Notes (Signed)
Subjective:    Patient ID: Shannon French, female    DOB: [birth year], 46 y.o.   MRN: [id number]  HPI  Mrs. Janak is back regarding her back pain. She tells me has fallen 5 times over the last couple months. She attributes it to her right leg which feels that it wants to give out. She describes a spasm in her back which then "shoots" down her leg and causes her to give out. She also reports numbness in the leg.    She also came off the gabapentin due to coughing and swelling in her throat. She felt that this has helped. She does have nausea still and is worried that it might be the meloxicam.  She disappointed because the meloxicam has been helpful for her generalized pain.   Sleep has decreased over the last couple months. She tries to stay fairly active but is concerned about falling given her recent hx.   Pain Inventory Average Pain 7 Pain Right Now 9 My pain is constant, sharp, burning, stabbing, tingling and aching  In the last 24 hours, has pain interfered with the following? General activity 3 Relation with others 3 Enjoyment of life 3 What TIME of day is your pain at its worst? all the time Sleep (in general) Poor  Pain is worse with: walking, bending, sitting, inactivity, standing and some activites Pain improves with: rest, heat/ice, therapy/exercise and medication Relief from Meds: 5  Mobility walk without assistance use a cane how many minutes can you walk? 30 min Do you have any goals in this area?  yes  Function disabled: date disabled 2003 I need assistance with the following:  meal prep, household duties and shopping Do you have any goals in this area?  yes  Neuro/Psych weakness numbness tingling trouble walking spasms  Prior Studies Any changes since last visit?  no  Physicians involved in your care Any changes since last visit?  no       Review of Systems  HENT: Negative.   Eyes: Negative.   Respiratory: Positive for cough and shortness of  breath.   Gastrointestinal: Positive for nausea, abdominal pain, diarrhea and constipation.  Genitourinary: Negative.   Musculoskeletal: Negative.   Skin: Negative.   Neurological: Positive for weakness.  Psychiatric/Behavioral: Negative.        Objective:   Physical Exam  Constitutional: She is oriented to person, place, and time. She appears well-developed and well-nourished.  HENT:  Head: Normocephalic and atraumatic.  Eyes: Conjunctivae and EOM are normal. Pupils are equal, round, and reactive to light.  Neck: Normal range of motion. Neck supple.  Cardiovascular: Normal rate and regular rhythm.   Pulmonary/Chest: Effort normal and breath sounds normal.  Abdominal: Soft.  Musculoskeletal:       Better posture. Pelvis symmetrical. Some flattening of the lordotic curve of the lumbar spine.   PSIS are less tender.   SLR positive on the right. SLR negative on left  Compression test minimally positive.  Needs extra time to rise from a seated to standing positioin. Gait is stable  Neurological: She is alert and oriented to person, place, and time.  Reflex Scores:      Tricep reflexes are 2+ on the right side and 2+ on the left side.      Bicep reflexes are 2+ on the right side and 2+ on the left side.      Brachioradialis reflexes are 2+ on the right side and 2+ on the left side.  Patellar reflexes are 1+ on the right side and 2+ on the left side.      Achilles reflexes are 1+ on the right side and 2+ on the left side.      3-4/5 right ADF, APF, KE, HF. 4+/5 on left  Diminished sensation to FT over the right foot and anterior leg  Psychiatric: She has a normal mood and affect. Her behavior is normal. Judgment and thought content normal. Cognition and memory are normal.          Assessment & Plan:  ASSESSMENT:  1. Bilateral sacroiliac joint dysfunction, status post radio  frequencies which were extremely helpful.  2. Chronic pain syndrome consistent with  fibromyalgia.  3. Increased right leg pain radiating from the low back over the last few months. Concerning for a lumbar radiculopathy on exam.  3. Type 2 diabetes.   PLAN:  1. Will order an MRI of the L-spine to r.o an L4 or L5 right-sided radic 2. I discussed appropriate stretching and exercises. She has a good  program at home which she can follow.  3. Hydrocodone and opana were refilled. Use meloxicam on a prn basis only for now given GI sx. 4. I think her neurontin may be at the source of her balance and fall issues although her exam was concerning today.  It's been 2 weeks since she fell and that's when she stopped the neurontin.   5. I will see her back in  About 1 month.Marland Kitchen

## 2012-01-01 ENCOUNTER — Ambulatory Visit
Admission: RE | Admit: 2012-01-01 | Discharge: 2012-01-01 | Disposition: A | Discharge status: Home / Self Care | Payer: Medicare Other | Source: Ambulatory Visit | Attending: Physical Medicine & Rehabilitation | Admitting: Physical Medicine & Rehabilitation

## 2012-01-01 DIAGNOSIS — IMO0002 Reserved for concepts with insufficient information to code with codable children: Secondary | ICD-10-CM

## 2012-01-01 DIAGNOSIS — M47817 Spondylosis without myelopathy or radiculopathy, lumbosacral region: Secondary | ICD-10-CM

## 2012-01-06 ENCOUNTER — Telehealth: Payer: Self-pay | Admitting: Physical Medicine & Rehabilitation

## 2012-01-06 NOTE — Telephone Encounter (Signed)
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

## 2012-01-07 NOTE — Telephone Encounter (Signed)
Pt aware of MRI results 

## 2012-01-14 ENCOUNTER — Ambulatory Visit: Payer: Medicare Other | Admitting: Physical Medicine & Rehabilitation

## 2012-01-14 ENCOUNTER — Encounter: Payer: Medicare Other | Attending: Physical Medicine & Rehabilitation | Admitting: Physical Medicine and Rehabilitation

## 2012-01-14 ENCOUNTER — Encounter: Payer: Self-pay | Admitting: Physical Medicine and Rehabilitation

## 2012-01-14 VITALS — BP 146/93 | HR 79 | Resp 16 | Ht 63.0 in | Wt 146.8 lb

## 2012-01-14 DIAGNOSIS — IMO0001 Reserved for inherently not codable concepts without codable children: Secondary | ICD-10-CM | POA: Insufficient documentation

## 2012-01-14 DIAGNOSIS — M549 Dorsalgia, unspecified: Secondary | ICD-10-CM | POA: Insufficient documentation

## 2012-01-14 DIAGNOSIS — E119 Type 2 diabetes mellitus without complications: Secondary | ICD-10-CM | POA: Insufficient documentation

## 2012-01-14 DIAGNOSIS — M79605 Pain in left leg: Secondary | ICD-10-CM

## 2012-01-14 DIAGNOSIS — E785 Hyperlipidemia, unspecified: Secondary | ICD-10-CM | POA: Insufficient documentation

## 2012-01-14 DIAGNOSIS — R209 Unspecified disturbances of skin sensation: Secondary | ICD-10-CM | POA: Insufficient documentation

## 2012-01-14 DIAGNOSIS — M810 Age-related osteoporosis without current pathological fracture: Secondary | ICD-10-CM | POA: Insufficient documentation

## 2012-01-14 DIAGNOSIS — I10 Essential (primary) hypertension: Secondary | ICD-10-CM | POA: Insufficient documentation

## 2012-01-14 DIAGNOSIS — M533 Sacrococcygeal disorders, not elsewhere classified: Secondary | ICD-10-CM | POA: Insufficient documentation

## 2012-01-14 DIAGNOSIS — G894 Chronic pain syndrome: Secondary | ICD-10-CM | POA: Insufficient documentation

## 2012-01-14 DIAGNOSIS — M545 Low back pain: Secondary | ICD-10-CM

## 2012-01-14 MED ORDER — HYDROCODONE-ACETAMINOPHEN 5-500 MG PO TABS: 1.0000 | ORAL_TABLET | Freq: Three times a day (TID) | ORAL | Status: DC | PRN

## 2012-01-14 MED ORDER — OXYMORPHONE HCL ER 10 MG PO T12A: 10.0000 mg | EXTENDED_RELEASE_TABLET | Freq: Two times a day (BID) | ORAL | Status: DC

## 2012-01-14 NOTE — Patient Instructions (Signed)
Continue with medication, continue with your exercises, continue with walking program.

## 2012-01-14 NOTE — Progress Notes (Signed)
Subjective:    Patient ID: Shannon French, female    DOB: 1965-09-19, 46 y.o.   MRN: [id number]  HPI The patient complains about chronic back pain which radiates into her left posterior hip. The patient states that she is doing fairly well today, and that she just has a tender spot in her left buttock cheek.  The patient also complains about numbness and tingling. The problem has  Improved. The patient states that she stays pretty active she walks regularly and does a HEP regularly, too.   Pain Inventory Average Pain 7 Pain Right Now 8 My pain is constant, sharp, burning, stabbing, tingling and aching  In the last 24 hours, has pain interfered with the following? General activity 5 Relation with others 5 Enjoyment of life 5 What TIME of day is your pain at its worst? constant Sleep (in general) Poor  Pain is worse with: walking, bending, sitting, standing and some activites Pain improves with: heat/ice and medication Relief from Meds: 5  Mobility walk without assistance use a cane how many minutes can you walk? 30  Function not employed: date last employed 2003  Neuro/Psych weakness numbness tingling trouble walking spasms  Prior Studies Any changes since last visit?  no  Physicians involved in your care Any changes since last visit?  no   Family History  Problem Relation Age of Onset  . Cancer Father    History   Social History  . Marital Status: Single    Spouse Name: N/A    Number of Children: N/A  . Years of Education: N/A   Social History Main Topics  . Smoking status: Former Smoker    Quit date: 10/14/2009  . Smokeless tobacco: Never Used  . Alcohol Use: No  . Drug Use: No  . Sexually Active: None   Other Topics Concern  . None   Social History Narrative  . None   Past Surgical History  Procedure Date  . Cholecystectomy   . Appendectomy   . Abdominal hysterectomy   . Ovary surgery    Past Medical History  Diagnosis Date  .  Hypertension   . Diabetes mellitus   . Degenerative disc disease   . Osteoporosis   . Hyperlipidemia   . IBS (irritable bowel syndrome)   . Fibromyalgia    BP 146/93  Pulse 79  Resp 16  Ht 5\' 3"  (1.6 m)  Wt 146 lb 12.8 oz (66.588 kg)  BMI 26.00 kg/m2  SpO2 98%    Review of Systems  Respiratory: Positive for cough and shortness of breath.   Cardiovascular: Positive for leg swelling.  Gastrointestinal: Positive for nausea, vomiting, diarrhea and constipation.  Musculoskeletal: Positive for back pain and gait problem.  Neurological: Positive for weakness and numbness.       Objective:   Physical Exam  Constitutional: She is oriented to person, place, and time. She appears well-developed and well-nourished.  HENT:  Head: Normocephalic.  Neck: Neck supple.  Musculoskeletal: She exhibits tenderness.  Neurological: She is alert and oriented to person, place, and time.  Skin: Skin is warm and dry.  Psychiatric: She has a normal mood and affect.    Symmetric normal motor tone is noted throughout. Normal muscle bulk. Muscle testing reveals 5/5 muscle strength of the upper extremity, and 5/5 of the lower extremity. Full range of motion in upper and lower extremities. ROM of spine is  restricted. Fine motor movements are normal in both hands. Sensory is intact and symmetric to light  touch, pinprick and proprioception. DTR in the upper and lower extremity are present and symmetric 2+. No clonus is noted.  Patient arises from chair without difficulty. Narrow based gait with normal arm swing bilateral , able to walk on heels and toes . Tandem walk is stable. No pronator drift.         Assessment & Plan:  1. Bilateral sacroiliac joint dysfunction, status post radio  frequencies which were extremely helpful.  2. Chronic pain syndrome consistent with fibromyalgia.   3. Type 2 diabetes.   Patient is doing fairly well today. She only complains about a muscle pain in her left  piriformis area. Advised patient to continue with her exercise and walking program. Refilled her hydrocodone and opana today.

## 2012-01-26 ENCOUNTER — Other Ambulatory Visit: Payer: Self-pay | Admitting: Physical Medicine & Rehabilitation

## 2012-02-17 ENCOUNTER — Encounter: Payer: Medicare Other | Admitting: Physical Medicine and Rehabilitation

## 2012-02-25 ENCOUNTER — Encounter: Payer: Self-pay | Admitting: Physical Medicine and Rehabilitation

## 2012-02-25 ENCOUNTER — Encounter
Payer: Medicare Other | Attending: Physical Medicine and Rehabilitation | Admitting: Physical Medicine and Rehabilitation

## 2012-02-25 VITALS — BP 142/68 | HR 84 | Resp 16 | Ht 63.0 in | Wt 145.4 lb

## 2012-02-25 DIAGNOSIS — G894 Chronic pain syndrome: Secondary | ICD-10-CM | POA: Insufficient documentation

## 2012-02-25 DIAGNOSIS — M533 Sacrococcygeal disorders, not elsewhere classified: Secondary | ICD-10-CM | POA: Insufficient documentation

## 2012-02-25 DIAGNOSIS — M25559 Pain in unspecified hip: Secondary | ICD-10-CM | POA: Insufficient documentation

## 2012-02-25 DIAGNOSIS — M545 Low back pain, unspecified: Secondary | ICD-10-CM | POA: Insufficient documentation

## 2012-02-25 DIAGNOSIS — IMO0001 Reserved for inherently not codable concepts without codable children: Secondary | ICD-10-CM

## 2012-02-25 DIAGNOSIS — M47817 Spondylosis without myelopathy or radiculopathy, lumbosacral region: Secondary | ICD-10-CM

## 2012-02-25 DIAGNOSIS — M797 Fibromyalgia: Secondary | ICD-10-CM

## 2012-02-25 DIAGNOSIS — E119 Type 2 diabetes mellitus without complications: Secondary | ICD-10-CM | POA: Insufficient documentation

## 2012-02-25 DIAGNOSIS — M47816 Spondylosis without myelopathy or radiculopathy, lumbar region: Secondary | ICD-10-CM

## 2012-02-25 MED ORDER — HYDROCODONE-ACETAMINOPHEN 5-500 MG PO TABS: 1.0000 | ORAL_TABLET | Freq: Three times a day (TID) | ORAL | Status: DC | PRN

## 2012-02-25 MED ORDER — OXYMORPHONE HCL ER 10 MG PO T12A: 10.0000 mg | EXTENDED_RELEASE_TABLET | Freq: Two times a day (BID) | ORAL | Status: DC

## 2012-02-25 NOTE — Patient Instructions (Addendum)
Continue with walking and exercises program, do your rubber-band exercises laying down.

## 2012-02-25 NOTE — Progress Notes (Signed)
Subjective:    Patient ID: Shannon French, female    DOB: 06/26/1966, 46 y.o.   MRN: [id number]  HPI The patient complains about chronic back pain which radiates into her left posterior hip. The patient states that she is doing fairly well today, and that she just has a tender spot in her left buttock cheek. The patient also complains about numbness and tingling.  The problem has Improved. The patient states that she stays pretty active she walks regularly and does a HEP regularly, too.   Pain Inventory Average Pain 9 Pain Right Now 9 My pain is constant, sharp, burning, stabbing, tingling and aching  In the last 24 hours, has pain interfered with the following? General activity 9 Relation with others 9 Enjoyment of life 9 What TIME of day is your pain at its worst? all of the time Sleep (in general) Poor  Pain is worse with: walking, bending, sitting, inactivity, standing and some activites Pain improves with: rest, heat/ice, therapy/exercise and medication Relief from Meds: 9  Mobility walk with assistance ability to climb steps?  yes do you drive?  yes  Function disabled: date disabled 2003 I need assistance with the following:  meal prep, household duties and shopping  Neuro/Psych weakness numbness tingling trouble walking spasms  Prior Studies Any changes since last visit?  no  Physicians involved in your care Any changes since last visit?  no   Family History  Problem Relation Age of Onset  . Cancer Father    History   Social History  . Marital Status: Single    Spouse Name: N/A    Number of Children: N/A  . Years of Education: N/A   Social History Main Topics  . Smoking status: Former Smoker    Quit date: 10/14/2009  . Smokeless tobacco: Never Used  . Alcohol Use: No  . Drug Use: No  . Sexually Active: None   Other Topics Concern  . None   Social History Narrative  . None   Past Surgical History  Procedure Date  . Cholecystectomy   .  Appendectomy   . Abdominal hysterectomy   . Ovary surgery    Past Medical History  Diagnosis Date  . Hypertension   . Diabetes mellitus   . Degenerative disc disease   . Osteoporosis   . Hyperlipidemia   . IBS (irritable bowel syndrome)   . Fibromyalgia    BP 142/68  Pulse 84  Resp 16  Ht 5\' 3"  (1.6 m)  Wt 145 lb 6.4 oz (65.953 kg)  BMI 25.76 kg/m2  SpO2 99%    Review of Systems  Respiratory: Positive for cough and shortness of breath.   Cardiovascular: Positive for leg swelling.  Gastrointestinal: Positive for nausea, vomiting, diarrhea and constipation.  Musculoskeletal: Positive for gait problem.  All other systems reviewed and are negative.       Objective:   Physical Exam Constitutional: She is oriented to person, place, and time. She appears well-developed and well-nourished.  HENT:  Head: Normocephalic.  Neck: Neck supple.  Musculoskeletal: She exhibits tenderness.  Neurological: She is alert and oriented to person, place, and time.  Skin: Skin is warm and dry.  Psychiatric: She has a normal mood and affect.   Symmetric normal motor tone is noted throughout. Normal muscle bulk. Muscle testing reveals 5/5 muscle strength of the upper extremity, and 5/5 of the lower extremity. Full range of motion in upper and lower extremities. ROM of spine is restricted. Fine motor movements  are normal in both hands.  Sensory is intact and symmetric to light touch, pinprick and proprioception.  DTR in the upper and lower extremity are present and symmetric 2+. No clonus is noted.  Patient arises from chair without difficulty. Narrow based gait with normal arm swing bilateral , able to walk on heels and toes . Tandem walk is stable. No pronator drift.         Assessment & Plan:  1. Bilateral sacroiliac joint dysfunction, status post radio  frequencies which were extremely helpful.  2. Chronic pain syndrome consistent with fibromyalgia.  3. Type 2 diabetes.  Patient is  doing fairly well today. She only complains about a muscle pain in her left piriformis area.  Advised patient to continue with her exercise and walking program, advised patient to do her rubber-band exercises in a lying position, to keep her neck in a good position, also showed her how to correct her posture.Marland Kitchen Refilled her hydrocodone and opana today.

## 2012-03-19 ENCOUNTER — Ambulatory Visit: Payer: Medicare Other | Admitting: Physical Medicine and Rehabilitation

## 2012-03-25 ENCOUNTER — Ambulatory Visit: Payer: Medicare Other | Admitting: Physical Medicine & Rehabilitation

## 2012-04-02 ENCOUNTER — Encounter: Payer: Self-pay | Admitting: Physical Medicine and Rehabilitation

## 2012-04-02 ENCOUNTER — Encounter
Payer: Medicare Other | Attending: Physical Medicine and Rehabilitation | Admitting: Physical Medicine and Rehabilitation

## 2012-04-02 VITALS — BP 141/87 | HR 95 | Resp 16 | Ht 63.0 in | Wt 142.0 lb

## 2012-04-02 DIAGNOSIS — M797 Fibromyalgia: Secondary | ICD-10-CM

## 2012-04-02 DIAGNOSIS — M47817 Spondylosis without myelopathy or radiculopathy, lumbosacral region: Secondary | ICD-10-CM

## 2012-04-02 DIAGNOSIS — M47816 Spondylosis without myelopathy or radiculopathy, lumbar region: Secondary | ICD-10-CM

## 2012-04-02 DIAGNOSIS — I10 Essential (primary) hypertension: Secondary | ICD-10-CM | POA: Insufficient documentation

## 2012-04-02 DIAGNOSIS — M533 Sacrococcygeal disorders, not elsewhere classified: Secondary | ICD-10-CM | POA: Insufficient documentation

## 2012-04-02 DIAGNOSIS — E785 Hyperlipidemia, unspecified: Secondary | ICD-10-CM | POA: Insufficient documentation

## 2012-04-02 DIAGNOSIS — IMO0001 Reserved for inherently not codable concepts without codable children: Secondary | ICD-10-CM

## 2012-04-02 DIAGNOSIS — E119 Type 2 diabetes mellitus without complications: Secondary | ICD-10-CM | POA: Insufficient documentation

## 2012-04-02 DIAGNOSIS — G894 Chronic pain syndrome: Secondary | ICD-10-CM | POA: Insufficient documentation

## 2012-04-02 DIAGNOSIS — M549 Dorsalgia, unspecified: Secondary | ICD-10-CM | POA: Insufficient documentation

## 2012-04-02 MED ORDER — TIZANIDINE HCL 2 MG PO CAPS: 2.0000 mg | ORAL_CAPSULE | Freq: Every evening | ORAL | Status: DC | PRN

## 2012-04-02 MED ORDER — OXYMORPHONE HCL ER 10 MG PO T12A: 10.0000 mg | EXTENDED_RELEASE_TABLET | Freq: Two times a day (BID) | ORAL | Status: DC

## 2012-04-02 MED ORDER — HYDROCODONE-ACETAMINOPHEN 5-500 MG PO TABS: 1.0000 | ORAL_TABLET | Freq: Three times a day (TID) | ORAL | Status: DC | PRN

## 2012-04-02 NOTE — Patient Instructions (Signed)
Continue with exercising and walking. 

## 2012-04-02 NOTE — Progress Notes (Signed)
Subjective:    Patient ID: Shannon French, female    DOB: [birth year], 46 y.o.   MRN: [id number]  HPI The patient complains about chronic back pain which radiates into her left posterior hip. The patient states that she is doing fairly well today.  The problem has Improved. The patient states that she stays pretty active she walks regularly and does a HEP regularly, too.   Pain Inventory Average Pain 9 Pain Right Now 7 My pain is constant, sharp, burning, tingling and aching  In the last 24 hours, has pain interfered with the following? General activity 10 Relation with others 10 Enjoyment of life 10 What TIME of day is your pain at its worst? All Day Sleep (in general) Poor  Pain is worse with: walking, bending, sitting, inactivity, standing and some activites Pain improves with: rest, heat/ice, therapy/exercise and medication Relief from Meds: 6  Mobility walk without assistance use a cane how many minutes can you walk? 20 ability to climb steps?  yes do you drive?  yes  Function disabled: date disabled 2003 I need assistance with the following:  meal prep, household duties and shopping  Neuro/Psych weakness numbness tingling trouble walking spasms  Prior Studies Any changes since last visit?  no  Physicians involved in your care Any changes since last visit?  no   Family History  Problem Relation Age of Onset  . Cancer Father    History   Social History  . Marital Status: Single    Spouse Name: N/A    Number of Children: N/A  . Years of Education: N/A   Social History Main Topics  . Smoking status: Former Smoker    Quit date: 10/14/2009  . Smokeless tobacco: Never Used  . Alcohol Use: No  . Drug Use: No  . Sexually Active: None   Other Topics Concern  . None   Social History Narrative  . None   Past Surgical History  Procedure Date  . Cholecystectomy   . Appendectomy   . Abdominal hysterectomy   . Ovary surgery    Past Medical History    Diagnosis Date  . Hypertension   . Diabetes mellitus   . Degenerative disc disease   . Osteoporosis   . Hyperlipidemia   . IBS (irritable bowel syndrome)   . Fibromyalgia    BP 141/87  Pulse 95  Resp 16  Ht 5\' 3"  (1.6 m)  Wt 142 lb (64.411 kg)  BMI 25.15 kg/m2  SpO2 96%      Review of Systems  HENT: Positive for neck pain.   Eyes: Negative.   Cardiovascular: Negative.   Gastrointestinal: Negative.   Genitourinary: Negative.   Musculoskeletal: Positive for back pain and gait problem.  Skin: Negative.   Neurological: Positive for weakness and numbness.  Hematological: Negative.   Psychiatric/Behavioral: Negative.        Objective:   Physical Exam Constitutional: She is oriented to person, place, and time. She appears well-developed and well-nourished.  HENT:  Head: Normocephalic.  Neck: Neck supple.  Musculoskeletal: She exhibits tenderness.  Neurological: She is alert and oriented to person, place, and time.  Skin: Skin is warm and dry.  Psychiatric: She has a normal mood and affect.  Symmetric normal motor tone is noted throughout. Normal muscle bulk. Muscle testing reveals 5/5 muscle strength of the upper extremity, and 5/5 of the lower extremity. Full range of motion in upper and lower extremities. ROM of spine is restricted. Fine motor movements are normal  in both hands.  Sensory is intact and symmetric to light touch, pinprick and proprioception.  DTR in the upper and lower extremity are present and symmetric 2+. No clonus is noted.  Patient arises from chair without difficulty. Narrow based gait with normal arm swing bilateral , able to walk on heels and toes . Tandem walk is stable. No pronator drift.         Assessment & Plan:  1. Bilateral sacroiliac joint dysfunction, status post radio  frequencies which were extremely helpful.  2. Chronic pain syndrome consistent with fibromyalgia.  3. Type 2 diabetes.  Patient is doing fairly well  today. Advised patient to continue with her exercise and walking program, advised patient to do her rubber-band exercises in a lying position, to keep her neck in a good position, also showed her how to correct her posture. Refilled her hydrocodone, tizanidine and opana today.

## 2012-05-01 ENCOUNTER — Encounter
Payer: Medicare Other | Attending: Physical Medicine and Rehabilitation | Admitting: Physical Medicine and Rehabilitation

## 2012-05-01 ENCOUNTER — Encounter: Payer: Self-pay | Admitting: Physical Medicine and Rehabilitation

## 2012-05-01 VITALS — BP 120/82 | HR 82 | Resp 16 | Ht 62.0 in | Wt 147.0 lb

## 2012-05-01 DIAGNOSIS — M47817 Spondylosis without myelopathy or radiculopathy, lumbosacral region: Secondary | ICD-10-CM

## 2012-05-01 DIAGNOSIS — IMO0001 Reserved for inherently not codable concepts without codable children: Secondary | ICD-10-CM

## 2012-05-01 DIAGNOSIS — M533 Sacrococcygeal disorders, not elsewhere classified: Secondary | ICD-10-CM | POA: Insufficient documentation

## 2012-05-01 DIAGNOSIS — E119 Type 2 diabetes mellitus without complications: Secondary | ICD-10-CM | POA: Insufficient documentation

## 2012-05-01 DIAGNOSIS — G894 Chronic pain syndrome: Secondary | ICD-10-CM | POA: Insufficient documentation

## 2012-05-01 MED ORDER — HYDROCODONE-ACETAMINOPHEN 5-500 MG PO TABS: 1.0000 | ORAL_TABLET | Freq: Three times a day (TID) | ORAL | Status: DC | PRN

## 2012-05-01 MED ORDER — OXYMORPHONE HCL ER 10 MG PO T12A: 10.0000 mg | EXTENDED_RELEASE_TABLET | Freq: Two times a day (BID) | ORAL | Status: DC

## 2012-05-01 NOTE — Patient Instructions (Signed)
Continue with your walking program and continue with your exercising program.

## 2012-05-01 NOTE — Progress Notes (Signed)
Subjective:    Patient ID: Shannon French, female    DOB: 01-19-1966, 46 y.o.   MRN: [id number]  HPI The patient complains about chronic back pain which radiates into her left posterior hip. The patient states that she is doing fairly well today.  The problem has Improved. The patient states that she stays pretty active she walks regularly and does a HEP regularly, too.   Pain Inventory Average Pain 8 Pain Right Now 7 My pain is dull  In the last 24 hours, has pain interfered with the following? General activity 7 Relation with others 7 Enjoyment of life 7 What TIME of day is your pain at its worst? All Day Sleep (in general) Fair  Pain is worse with: walking, bending, sitting, inactivity and standing Pain improves with: rest, heat/ice, therapy/exercise and medication Relief from Meds: 4  Mobility walk with assistance use a cane ability to climb steps?  yes do you drive?  yes  Function disabled: date disabled 2003 I need assistance with the following:  household duties and shopping  Neuro/Psych bladder control problems weakness numbness tingling trouble walking spasms  Prior Studies Any changes since last visit?  no  Physicians involved in your care Any changes since last visit?  no   Family History  Problem Relation Age of Onset  . Cancer Father    History   Social History  . Marital Status: Single    Spouse Name: N/A    Number of Children: N/A  . Years of Education: N/A   Social History Main Topics  . Smoking status: Former Smoker    Quit date: 10/14/2009  . Smokeless tobacco: Never Used  . Alcohol Use: No  . Drug Use: No  . Sexually Active: None   Other Topics Concern  . None   Social History Narrative  . None   Past Surgical History  Procedure Date  . Cholecystectomy   . Appendectomy   . Abdominal hysterectomy   . Ovary surgery    Past Medical History  Diagnosis Date  . Hypertension   . Diabetes mellitus   . Degenerative disc  disease   . Osteoporosis   . Hyperlipidemia   . IBS (irritable bowel syndrome)   . Fibromyalgia    BP 120/82  Pulse 82  Resp 16  Ht 5\' 2"  (1.575 m)  Wt 147 lb (66.679 kg)  BMI 26.89 kg/m2  SpO2 99%      Review of Systems  HENT: Positive for neck pain.   Eyes: Negative.   Respiratory: Negative.   Cardiovascular: Negative.   Gastrointestinal: Positive for nausea, vomiting and diarrhea.  Genitourinary: Positive for urgency.  Musculoskeletal: Positive for gait problem.  Skin: Negative.   Neurological: Positive for weakness.  Hematological: Negative.   Psychiatric/Behavioral: Negative.        Objective:   Physical Exam  Constitutional: She is oriented to person, place, and time. She appears well-developed and well-nourished.  HENT:  Head: Normocephalic.  Neck: Neck supple.  Musculoskeletal: She exhibits tenderness.  Neurological: She is alert and oriented to person, place, and time.  Skin: Skin is warm and dry.  Psychiatric: She has a normal mood and affect.  Symmetric normal motor tone is noted throughout. Normal muscle bulk. Muscle testing reveals 5/5 muscle strength of the upper extremity, and 5/5 of the lower extremity. Full range of motion in upper and lower extremities. ROM of spine is restricted. Fine motor movements are normal in both hands.  Sensory is intact and symmetric  to light touch, pinprick and proprioception.  DTR in the upper and lower extremity are present and symmetric 2+. No clonus is noted.  Patient arises from chair without difficulty. Narrow based gait with normal arm swing bilateral , able to walk on heels and toes . Tandem walk is stable. No pronator drift.        Assessment & Plan:  1. Bilateral sacroiliac joint dysfunction, status post radio  frequencies which were extremely helpful.  2. Chronic pain syndrome consistent with fibromyalgia.  3. Type 2 diabetes.  Patient is doing fairly well today.  Advised patient to continue with her  exercise and walking program, advised patient to do her rubber-band exercises in a lying position, to keep her neck in a good position, also showed her how to correct her posture. Refilled her hydrocodone,  and opana today.

## 2012-05-27 ENCOUNTER — Ambulatory Visit: Payer: Medicare Other | Admitting: Physical Medicine and Rehabilitation

## 2012-05-28 ENCOUNTER — Encounter: Payer: Self-pay | Admitting: Physical Medicine and Rehabilitation

## 2012-05-28 ENCOUNTER — Encounter
Payer: Medicare Other | Attending: Physical Medicine and Rehabilitation | Admitting: Physical Medicine and Rehabilitation

## 2012-05-28 VITALS — BP 131/78 | HR 89 | Resp 14 | Ht 63.0 in | Wt 149.0 lb

## 2012-05-28 DIAGNOSIS — M797 Fibromyalgia: Secondary | ICD-10-CM

## 2012-05-28 DIAGNOSIS — E785 Hyperlipidemia, unspecified: Secondary | ICD-10-CM | POA: Insufficient documentation

## 2012-05-28 DIAGNOSIS — Z5181 Encounter for therapeutic drug level monitoring: Secondary | ICD-10-CM

## 2012-05-28 DIAGNOSIS — IMO0001 Reserved for inherently not codable concepts without codable children: Secondary | ICD-10-CM

## 2012-05-28 DIAGNOSIS — E119 Type 2 diabetes mellitus without complications: Secondary | ICD-10-CM | POA: Insufficient documentation

## 2012-05-28 DIAGNOSIS — I10 Essential (primary) hypertension: Secondary | ICD-10-CM | POA: Insufficient documentation

## 2012-05-28 DIAGNOSIS — M533 Sacrococcygeal disorders, not elsewhere classified: Secondary | ICD-10-CM | POA: Insufficient documentation

## 2012-05-28 DIAGNOSIS — G894 Chronic pain syndrome: Secondary | ICD-10-CM | POA: Insufficient documentation

## 2012-05-28 MED ORDER — HYDROCODONE-ACETAMINOPHEN 5-500 MG PO TABS: 1.0000 | ORAL_TABLET | Freq: Three times a day (TID) | ORAL | Status: DC | PRN

## 2012-05-28 MED ORDER — OXYMORPHONE HCL ER 10 MG PO T12A: 10.0000 mg | EXTENDED_RELEASE_TABLET | Freq: Two times a day (BID) | ORAL | Status: DC

## 2012-05-28 NOTE — Progress Notes (Signed)
Subjective:    Patient ID: Shannon French, female    DOB: [birth year], 46 y.o.   MRN: [id number]  HPI The patient complains about chronic back pain which radiates into her left posterior hip. The patient states that she is doing fairly well today.  The problem is stable, although her symptoms have increased some , because of the cooler weather. The patient states that she stays pretty active she walks regularly and does a HEP regularly, too.   Pain Inventory Average Pain 8 Pain Right Now 8 My pain is constant  In the last 24 hours, has pain interfered with the following? General activity 9 Relation with others 8 Enjoyment of life 8 What TIME of day is your pain at its worst? all the time Sleep (in general) Fair  Pain is worse with: walking, bending, sitting, inactivity, standing and some activites Pain improves with: rest, heat/ice, therapy/exercise, pacing activities and medication Relief from Meds: 5  Mobility walk without assistance use a cane how many minutes can you walk? 20 ability to climb steps?  yes do you drive?  yes transfers alone Do you have any goals in this area?  yes  Function disabled: date disabled 2003 I need assistance with the following:  meal prep, household duties and shopping Do you have any goals in this area?  yes  Neuro/Psych bladder control problems weakness numbness tingling trouble walking spasms  Prior Studies Any changes since last visit?  no  Physicians involved in your care Any changes since last visit?  no   Family History  Problem Relation Age of Onset  . Cancer Father    History   Social History  . Marital Status: Single    Spouse Name: N/A    Number of Children: N/A  . Years of Education: N/A   Social History Main Topics  . Smoking status: Former Smoker    Quit date: 10/14/2009  . Smokeless tobacco: Never Used  . Alcohol Use: No  . Drug Use: No  . Sexually Active: None   Other Topics Concern  . None   Social  History Narrative  . None   Past Surgical History  Procedure Date  . Cholecystectomy   . Appendectomy   . Abdominal hysterectomy   . Ovary surgery    Past Medical History  Diagnosis Date  . Hypertension   . Diabetes mellitus   . Degenerative disc disease   . Osteoporosis   . Hyperlipidemia   . IBS (irritable bowel syndrome)   . Fibromyalgia    BP 131/78  Pulse 89  Resp 14  Ht 5\' 3"  (1.6 m)  Wt 149 lb (67.586 kg)  BMI 26.39 kg/m2  SpO2 94%     Review of Systems  HENT: Positive for neck pain.   Musculoskeletal: Positive for myalgias, back pain, arthralgias and gait problem.  Neurological: Positive for weakness and numbness.  All other systems reviewed and are negative.       Objective:   Physical Exam Constitutional: She is oriented to person, place, and time. She appears well-developed and well-nourished.  HENT:  Head: Normocephalic.  Neck: Neck supple.  Musculoskeletal: She exhibits tenderness.  Neurological: She is alert and oriented to person, place, and time.  Skin: Skin is warm and dry.  Psychiatric: She has a normal mood and affect.  Symmetric normal motor tone is noted throughout. Normal muscle bulk. Muscle testing reveals 5/5 muscle strength of the upper extremity, and 5/5 of the lower extremity. Full range of motion  in upper and lower extremities. ROM of spine is restricted. Fine motor movements are normal in both hands.  Sensory is intact and symmetric to light touch, pinprick and proprioception.  DTR in the upper and lower extremity are present and symmetric 2+. No clonus is noted.  Patient arises from chair without difficulty. Narrow based gait with normal arm swing bilateral , able to walk on heels and toes . Tandem walk is stable. No pronator drift.         Assessment & Plan:  1. Bilateral sacroiliac joint dysfunction, status post radio  frequencies which were extremely helpful.  2. Chronic pain syndrome consistent with fibromyalgia.  3.  Type 2 diabetes.  Patient is doing fairly well today, although she states that her symptoms have increased some because of the weather. She uses a heating pad regularly to get relief Advised patient to continue with her exercise and walking program, advised patient to do her rubber-band exercises in a lying position, to keep her neck in a good position, also showed her how to correct her posture. Refilled her hydrocodone, and opana today. Follow up in one month

## 2012-05-28 NOTE — Patient Instructions (Signed)
Continue with staying active , continue with exercising and walking.

## 2012-06-25 ENCOUNTER — Encounter
Payer: Medicare Other | Attending: Physical Medicine and Rehabilitation | Admitting: Physical Medicine and Rehabilitation

## 2012-06-25 ENCOUNTER — Encounter: Payer: Self-pay | Admitting: Physical Medicine and Rehabilitation

## 2012-06-25 VITALS — BP 133/87 | HR 104 | Resp 18 | Ht 63.0 in | Wt 150.6 lb

## 2012-06-25 DIAGNOSIS — M549 Dorsalgia, unspecified: Secondary | ICD-10-CM | POA: Insufficient documentation

## 2012-06-25 DIAGNOSIS — M25559 Pain in unspecified hip: Secondary | ICD-10-CM | POA: Insufficient documentation

## 2012-06-25 DIAGNOSIS — M47816 Spondylosis without myelopathy or radiculopathy, lumbar region: Secondary | ICD-10-CM

## 2012-06-25 DIAGNOSIS — G8929 Other chronic pain: Secondary | ICD-10-CM | POA: Insufficient documentation

## 2012-06-25 DIAGNOSIS — E119 Type 2 diabetes mellitus without complications: Secondary | ICD-10-CM | POA: Insufficient documentation

## 2012-06-25 DIAGNOSIS — IMO0001 Reserved for inherently not codable concepts without codable children: Secondary | ICD-10-CM

## 2012-06-25 DIAGNOSIS — G894 Chronic pain syndrome: Secondary | ICD-10-CM | POA: Insufficient documentation

## 2012-06-25 DIAGNOSIS — M47817 Spondylosis without myelopathy or radiculopathy, lumbosacral region: Secondary | ICD-10-CM

## 2012-06-25 DIAGNOSIS — M533 Sacrococcygeal disorders, not elsewhere classified: Secondary | ICD-10-CM | POA: Insufficient documentation

## 2012-06-25 MED ORDER — HYDROCODONE-ACETAMINOPHEN 5-500 MG PO TABS: 1.0000 | ORAL_TABLET | Freq: Three times a day (TID) | ORAL | Status: DC | PRN

## 2012-06-25 MED ORDER — OXYMORPHONE HCL ER 10 MG PO T12A: 10.0000 mg | EXTENDED_RELEASE_TABLET | Freq: Two times a day (BID) | ORAL | Status: DC

## 2012-06-25 NOTE — Progress Notes (Signed)
Subjective:    Patient ID: Shannon French, female    DOB: Dec 22, 1965, 45 y.o.   MRN: [id number]  HPI The patient complains about chronic back pain which radiates into her left posterior hip. The patient states that she is doing fairly well today.  The problem is stable, although her symptoms have increased some , because of the cooler weather. The patient states that she stays pretty active she walks regularly and does a HEP regularly, too.   Pain Inventory Average Pain 9 Pain Right Now 8 My pain is constant, sharp, burning, stabbing, tingling and aching  In the last 24 hours, has pain interfered with the following? General activity 8 Relation with others 8 Enjoyment of life 8 What TIME of day is your pain at its worst? all the time Sleep (in general) Fair  Pain is worse with: walking, bending, sitting, inactivity, standing and some activites Pain improves with: rest, heat/ice, therapy/exercise, medication and injections Relief from Meds: 4  Mobility walk with assistance use a cane how many minutes can you walk? 20 ability to climb steps?  yes do you drive?  yes transfers alone Do you have any goals in this area?  yes  Function disabled: date disabled 2003  Neuro/Psych bladder control problems weakness numbness tingling trouble walking spasms  Prior Studies Any changes since last visit?  no  Physicians involved in your care Any changes since last visit?  no   Family History  Problem Relation Age of Onset  . Cancer Father    History   Social History  . Marital Status: Single    Spouse Name: N/A    Number of Children: N/A  . Years of Education: N/A   Social History Main Topics  . Smoking status: Former Smoker    Quit date: 10/14/2009  . Smokeless tobacco: Never Used  . Alcohol Use: No  . Drug Use: No  . Sexually Active: None   Other Topics Concern  . None   Social History Narrative  . None   Past Surgical History  Procedure Date  .  Cholecystectomy   . Appendectomy   . Abdominal hysterectomy   . Ovary surgery    Past Medical History  Diagnosis Date  . Hypertension   . Diabetes mellitus   . Degenerative disc disease   . Osteoporosis   . Hyperlipidemia   . IBS (irritable bowel syndrome)   . Fibromyalgia    BP 133/87  Pulse 104  Resp 18  Ht 5\' 3"  (1.6 m)  Wt 150 lb 9.6 oz (68.312 kg)  BMI 26.68 kg/m2  SpO2 97%    Review of Systems  Gastrointestinal: Positive for nausea, diarrhea and constipation.  Musculoskeletal: Positive for myalgias, arthralgias and gait problem.  Neurological: Positive for weakness and numbness.       Spasms, tingling  All other systems reviewed and are negative.       Objective:   Physical Exam Constitutional: She is oriented to person, place, and time. She appears well-developed and well-nourished.  HENT:  Head: Normocephalic.  Neck: Neck supple.  Musculoskeletal: She exhibits tenderness.  Neurological: She is alert and oriented to person, place, and time.  Skin: Skin is warm and dry.  Psychiatric: She has a normal mood and affect.  Symmetric normal motor tone is noted throughout. Normal muscle bulk. Muscle testing reveals 5/5 muscle strength of the upper extremity, and 5/5 of the lower extremity. Full range of motion in upper and lower extremities. ROM of spine is restricted.  Fine motor movements are normal in both hands.  Sensory is intact and symmetric to light touch, pinprick and proprioception.  DTR in the upper and lower extremity are present and symmetric 2+. No clonus is noted.  Patient arises from chair without difficulty. Narrow based gait with normal arm swing bilateral , able to walk on heels and toes . Tandem walk is stable. No pronator drift.         Assessment & Plan:  1. Bilateral sacroiliac joint dysfunction, status post radio  frequencies which were extremely helpful.  2. Chronic pain syndrome consistent with fibromyalgia.  3. Type 2 diabetes.    Patient is doing fairly well today, although she states that her symptoms have increased some because of the weather. She uses a heating pad regularly to get relief  Advised patient to continue with her exercise and walking program, advised patient to do her rubber-band exercises in a lying position, to keep her neck in a good position, also showed her how to correct her posture. Refilled her hydrocodone, and opana today.  Follow up in one month

## 2012-06-25 NOTE — Patient Instructions (Signed)
Continue with your walking and exercise program 

## 2012-07-24 ENCOUNTER — Encounter: Payer: Self-pay | Admitting: Physical Medicine & Rehabilitation

## 2012-07-24 ENCOUNTER — Encounter: Payer: Medicare Other | Attending: Physical Medicine and Rehabilitation | Admitting: Physical Medicine & Rehabilitation

## 2012-07-24 VITALS — BP 119/67 | HR 79 | Resp 14 | Ht 63.0 in | Wt 150.0 lb

## 2012-07-24 DIAGNOSIS — M461 Sacroiliitis, not elsewhere classified: Secondary | ICD-10-CM | POA: Insufficient documentation

## 2012-07-24 DIAGNOSIS — IMO0001 Reserved for inherently not codable concepts without codable children: Secondary | ICD-10-CM | POA: Insufficient documentation

## 2012-07-24 DIAGNOSIS — IMO0002 Reserved for concepts with insufficient information to code with codable children: Secondary | ICD-10-CM | POA: Insufficient documentation

## 2012-07-24 DIAGNOSIS — M47817 Spondylosis without myelopathy or radiculopathy, lumbosacral region: Secondary | ICD-10-CM | POA: Insufficient documentation

## 2012-07-24 MED ORDER — HYDROCODONE-ACETAMINOPHEN 5-325 MG PO TABS: 1.0000 | ORAL_TABLET | Freq: Four times a day (QID) | ORAL | Status: DC | PRN

## 2012-07-24 MED ORDER — TIZANIDINE HCL 2 MG PO CAPS: 2.0000 mg | ORAL_CAPSULE | Freq: Every evening | ORAL | Status: DC | PRN

## 2012-07-24 MED ORDER — OXYMORPHONE HCL ER 10 MG PO T12A: 10.0000 mg | EXTENDED_RELEASE_TABLET | Freq: Two times a day (BID) | ORAL | Status: DC

## 2012-07-24 NOTE — Patient Instructions (Signed)
CONTINUE WITH REGULAR EXERCISE AND RANGE OF MOTION

## 2012-07-24 NOTE — Progress Notes (Signed)
Subjective:    Patient ID: Shannon French, female    DOB: 1966/06/13, 46 y.o.   MRN: [id number]  HPI  Shannon French is back regarding her chronic pain. Since I last saw her she developed mono which she struggled with for a couple months. Her left foot flared up this week with sudden onset of burning pain. As quickly as it came it went away Thursday.   From a low back and pelvis standpoint her back has been bothering her off and on, but not to the point where she feels she needs injections. She continues with her back exercises, pelvic exercises, regular walking, etc.     Pain Inventory Average Pain 8 Pain Right Now 9 My pain is constant, sharp, burning, stabbing, tingling and aching  In the last 24 hours, has pain interfered with the following? General activity 9 Relation with others 9 Enjoyment of life 9 What TIME of day is your pain at its worst? all the time Sleep (in general) Fair  Pain is worse with: some activites Pain improves with: medication Relief from Meds: 6  Mobility walk with assistance use a cane how many minutes can you walk? 20 ability to climb steps?  yes do you drive?  yes transfers alone Do you have any goals in this area?  yes  Function disabled: date disabled 2013 I need assistance with the following:  meal prep, household duties and shopping Do you have any goals in this area?  yes  Neuro/Psych bladder control problems weakness numbness tingling trouble walking spasms  Prior Studies Any changes since last visit?  no  Physicians involved in your care Any changes since last visit?  no   Family History  Problem Relation Age of Onset  . Cancer Father    History   Social History  . Marital Status: Single    Spouse Name: N/A    Number of Children: N/A  . Years of Education: N/A   Social History Main Topics  . Smoking status: Former Smoker    Quit date: 10/14/2009  . Smokeless tobacco: Never Used  . Alcohol Use: No  . Drug Use: No  .  Sexually Active: None   Other Topics Concern  . None   Social History Narrative  . None   Past Surgical History  Procedure Date  . Cholecystectomy   . Appendectomy   . Abdominal hysterectomy   . Ovary surgery    Past Medical History  Diagnosis Date  . Hypertension   . Diabetes mellitus   . Degenerative disc disease   . Osteoporosis   . Hyperlipidemia   . IBS (irritable bowel syndrome)   . Fibromyalgia    BP 119/67  Pulse 79  Resp 14  Ht 5\' 3"  (1.6 m)  Wt 150 lb (68.04 kg)  BMI 26.57 kg/m2  SpO2 96%     Review of Systems  Musculoskeletal: Positive for myalgias, back pain, arthralgias and gait problem.  Neurological: Positive for weakness and numbness.  All other systems reviewed and are negative.       Objective:   Physical Exam Constitutional: She is oriented to person, place, and time. She appears well-developed and well-nourished.  HENT:  Head: Normocephalic and atraumatic.  Eyes: Conjunctivae and EOM are normal. Pupils are equal, round, and reactive to light.  Neck: Normal range of motion. Neck supple.  Cardiovascular: Normal rate and regular rhythm.  Pulmonary/Chest: Effort normal and breath sounds normal.  Abdominal: Soft.  Musculoskeletal:  Better posture. Pelvis elevated on  the right and rotated 5 degrees counter clockwise. Some flattening of the lordotic curve of the lumbar spine.   PSIS are less tender.   SLR equivocal.  Compression test minimally positive.  Needs extra time to rise from a seated to standing positioin. Gait is stable  Neurological: She is alert and oriented to person, place, and time.  Reflex Scores:  Tricep reflexes are 2+ on the right side and 2+ on the left side.  Bicep reflexes are 2+ on the right side and 2+ on the left side.  Brachioradialis reflexes are 2+ on the right side and 2+ on the left side.  Patellar reflexes are 1+ on the right side and 2+ on the left side.  Achilles reflexes are 1+ on the right side and  2+ on the left side. 3-4/5 right ADF, APF, KE, HF. 4+/5 on left  Diminished sensation to FT over the right foot and anterior leg. No swelling or pain with palpation Psychiatric: She has a normal mood and affect. Her behavior is normal. Judgment and thought content normal. Cognition and memory are normal.    Assessment & Plan:   ASSESSMENT:  1. Bilateral sacroiliac joint dysfunction, status post radio  frequencies which were extremely helpful.  2. Chronic pain syndrome consistent with fibromyalgia.  3. ?Right lumbar radiculopathy 3. Type 2 diabetes.   PLAN:  1. Tizanidine for spasms and sleep. meloxicam prn for pain flares 2. Continue with regular stretching and exercises.  3. Hydrocodone and opana were refilled.  .  4. Follow up with my PA in about one month. 15 minutes of face to face patient care time were spent during this visit. All questions were encouraged and answered.

## 2012-08-28 ENCOUNTER — Ambulatory Visit: Payer: Medicare Other | Admitting: Physical Medicine and Rehabilitation

## 2012-09-04 ENCOUNTER — Encounter: Payer: Self-pay | Admitting: Physical Medicine and Rehabilitation

## 2012-09-04 ENCOUNTER — Encounter
Payer: Medicare Other | Attending: Physical Medicine and Rehabilitation | Admitting: Physical Medicine and Rehabilitation

## 2012-09-04 VITALS — BP 141/88 | HR 78 | Resp 14 | Ht 63.0 in | Wt 153.0 lb

## 2012-09-04 DIAGNOSIS — G8929 Other chronic pain: Secondary | ICD-10-CM | POA: Insufficient documentation

## 2012-09-04 DIAGNOSIS — IMO0001 Reserved for inherently not codable concepts without codable children: Secondary | ICD-10-CM | POA: Insufficient documentation

## 2012-09-04 DIAGNOSIS — E119 Type 2 diabetes mellitus without complications: Secondary | ICD-10-CM | POA: Insufficient documentation

## 2012-09-04 DIAGNOSIS — M461 Sacroiliitis, not elsewhere classified: Secondary | ICD-10-CM

## 2012-09-04 DIAGNOSIS — IMO0002 Reserved for concepts with insufficient information to code with codable children: Secondary | ICD-10-CM

## 2012-09-04 DIAGNOSIS — M533 Sacrococcygeal disorders, not elsewhere classified: Secondary | ICD-10-CM | POA: Insufficient documentation

## 2012-09-04 MED ORDER — HYDROCODONE-ACETAMINOPHEN 5-325 MG PO TABS: 1.0000 | ORAL_TABLET | Freq: Four times a day (QID) | ORAL | Status: DC | PRN

## 2012-09-04 MED ORDER — OXYMORPHONE HCL ER 10 MG PO T12A: 10.0000 mg | EXTENDED_RELEASE_TABLET | Freq: Two times a day (BID) | ORAL | Status: DC

## 2012-09-04 NOTE — Progress Notes (Signed)
Subjective:    Patient ID: Shannon French, female    DOB: [birth year], 47 y.o.   MRN: [id number]  HPI The patient complains about chronic back pain which radiates into her left posterior hip. The patient states that she is doing fairly well today.  The problem is stable, although her symptoms have increased some , because of the cooler weather. The patient states that she stays pretty active she walks regularly and does a HEP regularly, too.   Pain Inventory Average Pain 8 Pain Right Now 8 My pain is constant, sharp, burning, stabbing, tingling and aching  In the last 24 hours, has pain interfered with the following? General activity 9 Relation with others 9 Enjoyment of life 9 What TIME of day is your pain at its worst? all the time Sleep (in general) Fair  Pain is worse with: walking, bending, sitting, inactivity, standing and some activites Pain improves with: rest, heat/ice, therapy/exercise, pacing activities and medication Relief from Meds: 4  Mobility walk without assistance walk with assistance use a cane how many minutes can you walk? 30 ability to climb steps?  yes do you drive?  yes transfers alone Do you have any goals in this area?  yes  Function disabled: date disabled 2003 I need assistance with the following:  meal prep, household duties and shopping Do you have any goals in this area?  yes  Neuro/Psych bladder control problems weakness numbness tingling trouble walking spasms  Prior Studies Any changes since last visit?  no  Physicians involved in your care Any changes since last visit?  no   Family History  Problem Relation Age of Onset  . Cancer Father    History   Social History  . Marital Status: Single    Spouse Name: N/A    Number of Children: N/A  . Years of Education: N/A   Social History Main Topics  . Smoking status: Former Smoker    Quit date: 10/14/2009  . Smokeless tobacco: Never Used  . Alcohol Use: No  . Drug Use: No   . Sexually Active: None   Other Topics Concern  . None   Social History Narrative  . None   Past Surgical History  Procedure Date  . Cholecystectomy   . Appendectomy   . Abdominal hysterectomy   . Ovary surgery    Past Medical History  Diagnosis Date  . Hypertension   . Diabetes mellitus   . Degenerative disc disease   . Osteoporosis   . Hyperlipidemia   . IBS (irritable bowel syndrome)   . Fibromyalgia    BP 141/88  Pulse 78  Resp 14  Ht 5\' 3"  (1.6 m)  Wt 153 lb (69.4 kg)  BMI 27.10 kg/m2  SpO2 100%     Review of Systems  Musculoskeletal: Positive for myalgias, back pain, arthralgias and gait problem.  Neurological: Positive for weakness and numbness.  All other systems reviewed and are negative.       Objective:   Physical Exam Constitutional: She is oriented to person, place, and time. She appears well-developed and well-nourished.  HENT:  Head: Normocephalic.  Neck: Neck supple.  Musculoskeletal: She exhibits tenderness.  Neurological: She is alert and oriented to person, place, and time.  Skin: Skin is warm and dry.  Psychiatric: She has a normal mood and affect.  Symmetric normal motor tone is noted throughout. Normal muscle bulk. Muscle testing reveals 5/5 muscle strength of the upper extremity, and 5/5 of the lower extremity. Full range  of motion in upper and lower extremities. ROM of spine is restricted. Fine motor movements are normal in both hands.  Sensory is intact and symmetric to light touch, pinprick and proprioception.  DTR in the upper and lower extremity are present and symmetric 2+. No clonus is noted.  Patient arises from chair without difficulty. Narrow based gait with normal arm swing bilateral , able to walk on heels and toes . Tandem walk is stable. No pronator drift.         Assessment & Plan:  1. Bilateral sacroiliac joint dysfunction, status post radio  frequencies which were extremely helpful.  2. Chronic pain syndrome  consistent with fibromyalgia.  3. Type 2 diabetes.  Patient is doing fairly well today, although she states that her symptoms have increased some because of the weather. She uses a heating pad regularly to get relief  Advised patient to continue with her exercise and walking program, advised patient to do her rubber-band exercises in a lying position, to keep her neck in a good position, also showed her how to correct her posture. Refilled her hydrocodone, and opana today.  Follow up in one month

## 2012-09-04 NOTE — Patient Instructions (Signed)
Continue with your walking and exercise program 

## 2012-10-02 ENCOUNTER — Encounter: Payer: Medicare Other | Admitting: Physical Medicine and Rehabilitation

## 2012-10-06 ENCOUNTER — Encounter: Payer: Medicare Other | Admitting: Physical Medicine and Rehabilitation

## 2012-10-07 ENCOUNTER — Encounter: Payer: Self-pay | Admitting: Physical Medicine and Rehabilitation

## 2012-10-07 ENCOUNTER — Encounter
Payer: Medicare Other | Attending: Physical Medicine and Rehabilitation | Admitting: Physical Medicine and Rehabilitation

## 2012-10-07 VITALS — BP 142/84 | HR 81 | Resp 16 | Ht 63.0 in | Wt 158.0 lb

## 2012-10-07 DIAGNOSIS — IMO0002 Reserved for concepts with insufficient information to code with codable children: Secondary | ICD-10-CM

## 2012-10-07 DIAGNOSIS — M461 Sacroiliitis, not elsewhere classified: Secondary | ICD-10-CM

## 2012-10-07 DIAGNOSIS — M47817 Spondylosis without myelopathy or radiculopathy, lumbosacral region: Secondary | ICD-10-CM

## 2012-10-07 DIAGNOSIS — IMO0001 Reserved for inherently not codable concepts without codable children: Secondary | ICD-10-CM

## 2012-10-07 DIAGNOSIS — E119 Type 2 diabetes mellitus without complications: Secondary | ICD-10-CM | POA: Insufficient documentation

## 2012-10-07 DIAGNOSIS — G8929 Other chronic pain: Secondary | ICD-10-CM | POA: Insufficient documentation

## 2012-10-07 DIAGNOSIS — M533 Sacrococcygeal disorders, not elsewhere classified: Secondary | ICD-10-CM | POA: Insufficient documentation

## 2012-10-07 MED ORDER — DULOXETINE HCL 60 MG PO CPEP: ORAL_CAPSULE | ORAL | Status: DC

## 2012-10-07 MED ORDER — METHOCARBAMOL 500 MG PO TABS: 500.0000 mg | ORAL_TABLET | Freq: Three times a day (TID) | ORAL | Status: DC

## 2012-10-07 MED ORDER — HYDROCODONE-ACETAMINOPHEN 5-325 MG PO TABS: 1.0000 | ORAL_TABLET | Freq: Four times a day (QID) | ORAL | Status: DC | PRN

## 2012-10-07 MED ORDER — OXYMORPHONE HCL ER 10 MG PO T12A: 10.0000 mg | EXTENDED_RELEASE_TABLET | Freq: Two times a day (BID) | ORAL | Status: DC

## 2012-10-07 NOTE — Progress Notes (Deleted)
  Subjective:    Patient ID: Shannon French, female    DOB: 1965-12-04, 47 y.o.   MRN: [id number]  HPI    Review of Systems     Objective:   Physical Exam        Assessment & Plan:

## 2012-10-07 NOTE — Progress Notes (Signed)
Subjective:    Patient ID: Shannon French, female    DOB: August 26, 1965, 47 y.o.   MRN: [id number]  HPI The patient complains about chronic back pain which radiates into her left posterior hip. The patient states that she is doing fairly well today.  The problem is stable, although her symptoms have increased some , because of the cooler weather. The patient states that she stays pretty active she walks regularly and does a HEP regularly, too.   Pain Inventory Average Pain 9 Pain Right Now 10 My pain is constant, sharp, burning, stabbing, tingling and aching  In the last 24 hours, has pain interfered with the following? General activity 10 Relation with others 9 Enjoyment of life 9 What TIME of day is your pain at its worst? morning,daytime,evening, night Sleep (in general) Poor  Pain is worse with: walking, bending, sitting, inactivity, standing and some activites Pain improves with: rest, heat/ice, therapy/exercise, pacing activities and medication Relief from Meds: 3  Mobility walk without assistance walk with assistance use a cane how many minutes can you walk? 20-30 min ability to climb steps?  yes do you drive?  yes transfers alone Do you have any goals in this area?  yes  Function disabled: date disabled 2003 I need assistance with the following:  meal prep, household duties and shopping Do you have any goals in this area?  yes  Neuro/Psych bladder control problems weakness numbness tingling trouble walking spasms  Prior Studies Any changes since last visit?  no  Physicians involved in your care Any changes since last visit?  no   Family History  Problem Relation Age of Onset  . Cancer Father    History   Social History  . Marital Status: Single    Spouse Name: N/A    Number of Children: N/A  . Years of Education: N/A   Social History Main Topics  . Smoking status: Former Smoker    Quit date: 10/14/2009  . Smokeless tobacco: Never Used  . Alcohol  Use: No  . Drug Use: No  . Sexually Active: None   Other Topics Concern  . None   Social History Narrative  . None   Past Surgical History  Procedure Laterality Date  . Cholecystectomy    . Appendectomy    . Abdominal hysterectomy    . Ovary surgery     Past Medical History  Diagnosis Date  . Hypertension   . Diabetes mellitus   . Degenerative disc disease   . Osteoporosis   . Hyperlipidemia   . IBS (irritable bowel syndrome)   . Fibromyalgia    BP 142/84  Pulse 81  Resp 16  Ht 5\' 3"  (1.6 m)  Wt 158 lb (71.668 kg)  BMI 28 kg/m2  SpO2 99%      Review of Systems  Musculoskeletal: Positive for myalgias, arthralgias and gait problem.  Neurological: Positive for weakness and numbness.  All other systems reviewed and are negative.       Objective:   Physical Exam Constitutional: She is oriented to person, place, and time. She appears well-developed and well-nourished.  HENT:  Head: Normocephalic.  Neck: Neck supple.  Musculoskeletal: She exhibits tenderness.  Neurological: She is alert and oriented to person, place, and time.  Skin: Skin is warm and dry.  Psychiatric: She has a normal mood and affect.  Symmetric normal motor tone is noted throughout. Normal muscle bulk. Muscle testing reveals 5/5 muscle strength of the upper extremity, and 5/5 of the  lower extremity. Full range of motion in upper and lower extremities. ROM of spine is restricted. Fine motor movements are normal in both hands.  Sensory is intact and symmetric to light touch, pinprick and proprioception.  DTR in the upper and lower extremity are present and symmetric 2+. No clonus is noted.  Patient arises from chair without difficulty. Narrow based gait with normal arm swing bilateral , able to walk on heels and toes . Tandem walk is stable. No pronator drift.        Assessment & Plan:  1. Bilateral sacroiliac joint dysfunction, status post radio  frequencies which were extremely helpful.   2. Chronic pain syndrome consistent with fibromyalgia.  3. Type 2 diabetes.  Patient is doing fairly well today, although she states that her symptoms have increased some because of the weather. She uses a heating pad regularly to get relief  Advised patient to continue with her exercise and walking program, advised patient to do her rubber-band exercises in a lying position, to keep her neck in a good position, also showed her how to correct her posture. Refilled her hydrocodone, and opana today. Also filled her Cymbalta, and prescribed Robaxin, her insurance is not paying for the Flexeril anymore.  Discussed with the patient that she was late several times to her visits (5/7), including today, without any legitimate reason, she also had some last minute cancelations. I told her that if she is late that throws off our whole schedule and that the next patients have to wait although they were on time. I told her that if this happens again, we will d/c her. The patient understood, and stated, that she will be on time from now on. Follow up in one month

## 2012-10-07 NOTE — Patient Instructions (Signed)
Continue with your walking and exercise program 

## 2012-10-07 NOTE — Addendum Note (Signed)
Addended by: Doreene Eland on: 10/07/2012 11:16 AM   Modules accepted: Orders

## 2012-11-06 ENCOUNTER — Encounter: Payer: Self-pay | Admitting: Physical Medicine and Rehabilitation

## 2012-11-06 ENCOUNTER — Encounter
Payer: Medicare Other | Attending: Physical Medicine and Rehabilitation | Admitting: Physical Medicine and Rehabilitation

## 2012-11-06 VITALS — BP 133/90 | HR 89 | Resp 14 | Ht 63.0 in | Wt 149.0 lb

## 2012-11-06 DIAGNOSIS — IMO0001 Reserved for inherently not codable concepts without codable children: Secondary | ICD-10-CM

## 2012-11-06 DIAGNOSIS — IMO0002 Reserved for concepts with insufficient information to code with codable children: Secondary | ICD-10-CM | POA: Insufficient documentation

## 2012-11-06 DIAGNOSIS — M461 Sacroiliitis, not elsewhere classified: Secondary | ICD-10-CM | POA: Insufficient documentation

## 2012-11-06 DIAGNOSIS — M47817 Spondylosis without myelopathy or radiculopathy, lumbosacral region: Secondary | ICD-10-CM

## 2012-11-06 MED ORDER — HYDROCODONE-ACETAMINOPHEN 5-325 MG PO TABS: 1.0000 | ORAL_TABLET | Freq: Four times a day (QID) | ORAL | Status: DC | PRN

## 2012-11-06 MED ORDER — OXYMORPHONE HCL ER 10 MG PO T12A: 10.0000 mg | EXTENDED_RELEASE_TABLET | Freq: Two times a day (BID) | ORAL | Status: DC

## 2012-11-06 NOTE — Patient Instructions (Addendum)
Continue with your home exercise program, try to stay as active as tolerated.

## 2012-11-06 NOTE — Progress Notes (Signed)
Subjective:    Patient ID: Shannon French, female    DOB: Sep 02, 1965, 47 y.o.   MRN: [id number]  HPI The patient complains about chronic back pain which radiates into her left posterior hip. The patient states that she is doing fairly well today.  The problem is stable, although her symptoms have increased some , because of the cooler weather, and because of some family issues, her daughter was diagnosed with Addinson's disease. The patient states that she stays pretty active she walks regularly and does a HEP regularly, too.   Pain Inventory Average Pain 10 Pain Right Now 10 My pain is constant, sharp, burning, stabbing, tingling and aching  In the last 24 hours, has pain interfered with the following? General activity 7 Relation with others 7 Enjoyment of life 7 What TIME of day is your pain at its worst? all the time Sleep (in general) Poor  Pain is worse with: walking, bending, sitting, inactivity, standing and some activites Pain improves with: rest, heat/ice, therapy/exercise, pacing activities and medication Relief from Meds: 3  Mobility walk without assistance walk with assistance use a cane how many minutes can you walk? 30 ability to climb steps?  yes do you drive?  yes transfers alone Do you have any goals in this area?  yes  Function disabled: date disabled 03 I need assistance with the following:  meal prep, household duties and shopping  Neuro/Psych bladder control problems weakness numbness tingling trouble walking spasms  Prior Studies Any changes since last visit?  no  Physicians involved in your care Any changes since last visit?  no   Family History  Problem Relation Age of Onset  . Cancer Father    History   Social History  . Marital Status: Single    Spouse Name: N/A    Number of Children: N/A  . Years of Education: N/A   Social History Main Topics  . Smoking status: Former Smoker    Quit date: 10/14/2009  . Smokeless tobacco:  Never Used  . Alcohol Use: No  . Drug Use: No  . Sexually Active: None   Other Topics Concern  . None   Social History Narrative  . None   Past Surgical History  Procedure Laterality Date  . Cholecystectomy    . Appendectomy    . Abdominal hysterectomy    . Ovary surgery     Past Medical History  Diagnosis Date  . Hypertension   . Diabetes mellitus   . Degenerative disc disease   . Osteoporosis   . Hyperlipidemia   . IBS (irritable bowel syndrome)   . Fibromyalgia    BP 133/90  Pulse 89  Resp 14  Ht 5\' 3"  (1.6 m)  Wt 149 lb (67.586 kg)  BMI 26.4 kg/m2  SpO2 96%     Review of Systems  HENT: Positive for neck pain.   Genitourinary: Positive for difficulty urinating.  Musculoskeletal: Positive for back pain and gait problem.  Neurological: Positive for weakness and numbness.  All other systems reviewed and are negative.       Objective:   Physical Exam  Constitutional: She is oriented to person, place, and time. She appears well-developed and well-nourished.  HENT:  Head: Normocephalic.  Neck: Neck supple.  Musculoskeletal: She exhibits tenderness.  Neurological: She is alert and oriented to person, place, and time.  Skin: Skin is warm and dry.  Psychiatric: She has a normal mood and affect.  Symmetric normal motor tone is noted throughout. Normal  muscle bulk. Muscle testing reveals 5/5 muscle strength of the upper extremity, and 5/5 of the lower extremity. Full range of motion in upper and lower extremities. ROM of spine is restricted. Fine motor movements are normal in both hands.  Sensory is intact and symmetric to light touch, pinprick and proprioception.  DTR in the upper and lower extremity are present and symmetric 2+. No clonus is noted.  Patient arises from chair without difficulty. Narrow based gait with normal arm swing bilateral , able to walk on heels and toes . Tandem walk is stable. No pronator drift.       Assessment & Plan:  1.  Bilateral sacroiliac joint dysfunction, status post radio  frequencies which were extremely helpful.  2. Chronic pain syndrome consistent with fibromyalgia.  3. Type 2 diabetes.  Patient is doing fairly well today, although she states that her symptoms have increased some because of the weather. She uses a heating pad regularly to get relief  Advised patient to continue with her exercise and walking program, advised patient to do her rubber-band exercises in a lying position, to keep her neck in a good position, also showed her how to correct her posture. Refilled her hydrocodone, and opana today. Also filled her Cymbalta, and prescribed Robaxin, her insurance is not paying for the Flexeril anymore.  Discussed, at the last visit, with the patient that she was late several times to her visits (5/7), including today, without any legitimate reason, she also had some last minute cancelations. I told her that if she is late that throws off our whole schedule and that the next patients have to wait although they were on time. I told her that if this happens again, we will d/c her. The patient understood, and stated, that she will be on time from now on. Patient was on time today. Follow up in one month

## 2012-12-15 ENCOUNTER — Encounter: Payer: Self-pay | Admitting: Physical Medicine and Rehabilitation

## 2012-12-15 ENCOUNTER — Encounter
Payer: Medicare Other | Attending: Physical Medicine and Rehabilitation | Admitting: Physical Medicine and Rehabilitation

## 2012-12-15 VITALS — BP 143/86 | HR 88 | Resp 14 | Ht 63.0 in | Wt 150.0 lb

## 2012-12-15 DIAGNOSIS — M461 Sacroiliitis, not elsewhere classified: Secondary | ICD-10-CM

## 2012-12-15 DIAGNOSIS — IMO0001 Reserved for inherently not codable concepts without codable children: Secondary | ICD-10-CM

## 2012-12-15 DIAGNOSIS — G894 Chronic pain syndrome: Secondary | ICD-10-CM | POA: Insufficient documentation

## 2012-12-15 DIAGNOSIS — M47817 Spondylosis without myelopathy or radiculopathy, lumbosacral region: Secondary | ICD-10-CM

## 2012-12-15 DIAGNOSIS — G8929 Other chronic pain: Secondary | ICD-10-CM | POA: Insufficient documentation

## 2012-12-15 DIAGNOSIS — E119 Type 2 diabetes mellitus without complications: Secondary | ICD-10-CM | POA: Insufficient documentation

## 2012-12-15 DIAGNOSIS — IMO0002 Reserved for concepts with insufficient information to code with codable children: Secondary | ICD-10-CM

## 2012-12-15 DIAGNOSIS — M533 Sacrococcygeal disorders, not elsewhere classified: Secondary | ICD-10-CM | POA: Insufficient documentation

## 2012-12-15 MED ORDER — TIZANIDINE HCL 2 MG PO CAPS: 2.0000 mg | ORAL_CAPSULE | Freq: Three times a day (TID) | ORAL | Status: DC

## 2012-12-15 MED ORDER — OXYMORPHONE HCL ER 10 MG PO T12A: 10.0000 mg | EXTENDED_RELEASE_TABLET | Freq: Two times a day (BID) | ORAL | Status: DC

## 2012-12-15 MED ORDER — HYDROCODONE-ACETAMINOPHEN 5-325 MG PO TABS: 1.0000 | ORAL_TABLET | Freq: Four times a day (QID) | ORAL | Status: DC | PRN

## 2012-12-15 NOTE — Progress Notes (Signed)
Subjective:    Patient ID: Shannon French, female    DOB: 1965/10/27, 47 y.o.   MRN: [id number]  HPI The patient complains about chronic back pain which radiates into her left posterior hip. The patient states that she is doing fairly well today.  The problem is stable. The patient states that she stays pretty active she walks regularly and does a HEP regularly, too.  Pain Inventory Average Pain 5 Pain Right Now 5 My pain is constant, sharp, burning, stabbing, tingling and aching  In the last 24 hours, has pain interfered with the following? General activity 5 Relation with others 5 Enjoyment of life 5 What TIME of day is your pain at its worst? all the time Sleep (in general) Fair  Pain is worse with: walking, bending, sitting, inactivity, standing and some activites Pain improves with: rest, heat/ice, therapy/exercise, pacing activities and medication Relief from Meds: 5  Mobility walk without assistance walk with assistance how many minutes can you walk? 30 ability to climb steps?  yes do you drive?  yes transfers alone Do you have any goals in this area?  yes  Function disabled: date disabled 2003 I need assistance with the following:  meal prep, household duties and shopping Do you have any goals in this area?  yes  Neuro/Psych bladder control problems weakness numbness tingling trouble walking spasms  Prior Studies Any changes since last visit?  no  Physicians involved in your care Any changes since last visit?  no   Family History  Problem Relation Age of Onset  . Cancer Father    History   Social History  . Marital Status: Single    Spouse Name: N/A    Number of Children: N/A  . Years of Education: N/A   Social History Main Topics  . Smoking status: Former Smoker    Quit date: 10/14/2009  . Smokeless tobacco: Never Used  . Alcohol Use: No  . Drug Use: No  . Sexually Active: None   Other Topics Concern  . None   Social History Narrative   . None   Past Surgical History  Procedure Laterality Date  . Cholecystectomy    . Appendectomy    . Abdominal hysterectomy    . Ovary surgery     Past Medical History  Diagnosis Date  . Hypertension   . Diabetes mellitus   . Degenerative disc disease   . Osteoporosis   . Hyperlipidemia   . IBS (irritable bowel syndrome)   . Fibromyalgia    BP 143/86  Pulse 88  Resp 14  Ht 5\' 3"  (1.6 m)  Wt 150 lb (68.04 kg)  BMI 26.58 kg/m2  SpO2 96%     Review of Systems  HENT: Positive for neck pain.   Musculoskeletal: Positive for myalgias, back pain, arthralgias and gait problem.  Neurological: Positive for weakness and numbness.  All other systems reviewed and are negative.       Objective:   Physical Exam Constitutional: She is oriented to person, place, and time. She appears well-developed and well-nourished.  HENT:  Head: Normocephalic.  Neck: Neck supple.  Musculoskeletal: She exhibits tenderness.  Neurological: She is alert and oriented to person, place, and time.  Skin: Skin is warm and dry.  Psychiatric: She has a normal mood and affect.  Symmetric normal motor tone is noted throughout. Normal muscle bulk. Muscle testing reveals 5/5 muscle strength of the upper extremity, and 5/5 of the lower extremity. Full range of motion in upper  and lower extremities. ROM of spine is restricted. Fine motor movements are normal in both hands.  Sensory is intact and symmetric to light touch, pinprick and proprioception.  DTR in the upper and lower extremity are present and symmetric 2+. No clonus is noted.  Patient arises from chair without difficulty. Narrow based gait with normal arm swing bilateral , able to walk on heels and toes . Tandem walk is stable. No pronator drift.        Assessment & Plan:  1. Bilateral sacroiliac joint dysfunction, status post radio  frequencies which were extremely helpful.  2. Chronic pain syndrome consistent with fibromyalgia.  3. Type 2  diabetes.  Patient is doing fairly well today. She uses a heating pad regularly to get relief  Advised patient to continue with her exercise and walking program, advised patient to do her rubber-band exercises in a lying position, to keep her neck in a good position, also showed her how to correct her posture. Refilled her hydrocodone, and opana today. Also filled her Cymbalta, and prescribed Robaxin, her insurance is not paying for the Flexeril anymore and not paying for the Robaxin neither. The insurance is paying for Tizanidine, the patient is already taking this medication , but only at night time, gave her a new prescription for tizanidine 2mg  , tid, # 90 prn muscle spasms. Discussed, at the last visit, with the patient that she was late several times to her visits (5/7), including today, without any legitimate reason, she also had some last minute cancelations. I told her that if she is late that throws off our whole schedule and that the next patients have to wait although they were on time. I told her that if this happens again, we will d/c her. The patient understood, and stated, that she will be on time from now on. Patient was on time today.  Follow up in one month

## 2012-12-15 NOTE — Patient Instructions (Signed)
Continue with your exercise and walking program 

## 2012-12-31 ENCOUNTER — Other Ambulatory Visit: Payer: Self-pay | Admitting: Physical Medicine & Rehabilitation

## 2013-01-19 ENCOUNTER — Encounter
Payer: Medicare Other | Attending: Physical Medicine and Rehabilitation | Admitting: Physical Medicine and Rehabilitation

## 2013-01-19 ENCOUNTER — Encounter: Payer: Self-pay | Admitting: Physical Medicine and Rehabilitation

## 2013-01-19 VITALS — BP 114/66 | HR 82 | Resp 14 | Ht 63.0 in | Wt 145.6 lb

## 2013-01-19 DIAGNOSIS — E119 Type 2 diabetes mellitus without complications: Secondary | ICD-10-CM | POA: Insufficient documentation

## 2013-01-19 DIAGNOSIS — E785 Hyperlipidemia, unspecified: Secondary | ICD-10-CM | POA: Insufficient documentation

## 2013-01-19 DIAGNOSIS — IMO0002 Reserved for concepts with insufficient information to code with codable children: Secondary | ICD-10-CM

## 2013-01-19 DIAGNOSIS — I10 Essential (primary) hypertension: Secondary | ICD-10-CM | POA: Insufficient documentation

## 2013-01-19 DIAGNOSIS — G894 Chronic pain syndrome: Secondary | ICD-10-CM | POA: Insufficient documentation

## 2013-01-19 DIAGNOSIS — M461 Sacroiliitis, not elsewhere classified: Secondary | ICD-10-CM

## 2013-01-19 DIAGNOSIS — M533 Sacrococcygeal disorders, not elsewhere classified: Secondary | ICD-10-CM | POA: Insufficient documentation

## 2013-01-19 DIAGNOSIS — M47817 Spondylosis without myelopathy or radiculopathy, lumbosacral region: Secondary | ICD-10-CM

## 2013-01-19 DIAGNOSIS — IMO0001 Reserved for inherently not codable concepts without codable children: Secondary | ICD-10-CM

## 2013-01-19 MED ORDER — HYDROCODONE-ACETAMINOPHEN 5-325 MG PO TABS: 1.0000 | ORAL_TABLET | Freq: Four times a day (QID) | ORAL | Status: DC | PRN

## 2013-01-19 MED ORDER — OXYMORPHONE HCL ER 10 MG PO T12A: 10.0000 mg | EXTENDED_RELEASE_TABLET | Freq: Two times a day (BID) | ORAL | Status: DC

## 2013-01-19 NOTE — Progress Notes (Signed)
Subjective:    Patient ID: Shannon French, female    DOB: 07/09/1966, 47 y.o.   MRN: [id number]  HPI The patient complains about chronic back pain which radiates into her left posterior hip. The patient states that she is doing fairly well today.  The problem is stable. The patient states that she stays pretty active she walks regularly and does a HEP regularly, too. She reports that she is walking 40 min per day now , and she is working out in the pool almost daily.  Pain Inventory Average Pain 5 Pain Right Now 4 My pain is constant, sharp, burning, stabbing, tingling and aching  In the last 24 hours, has pain interfered with the following? General activity 5 Relation with others 5 Enjoyment of life 5 What TIME of day is your pain at its worst? all Sleep (in general) Fair  Pain is worse with: walking, bending, sitting, inactivity, standing and some activites Pain improves with: rest, heat/ice, therapy/exercise, pacing activities and medication Relief from Meds: 5  Mobility walk without assistance how many minutes can you walk? 40 ability to climb steps?  yes do you drive?  yes  Function disabled: date disabled 2003 I need assistance with the following:  meal prep, household duties and shopping  Neuro/Psych bladder control problems weakness numbness tingling trouble walking spasms  Prior Studies Any changes since last visit?  no  Physicians involved in your care Any changes since last visit?  no   Family History  Problem Relation Age of Onset  . Cancer Father    History   Social History  . Marital Status: Single    Spouse Name: N/A    Number of Children: N/A  . Years of Education: N/A   Social History Main Topics  . Smoking status: Former Smoker    Quit date: 10/14/2009  . Smokeless tobacco: Never Used  . Alcohol Use: No  . Drug Use: No  . Sexually Active: None   Other Topics Concern  . None   Social History Narrative  . None   Past Surgical  History  Procedure Laterality Date  . Cholecystectomy    . Appendectomy    . Abdominal hysterectomy    . Ovary surgery     Past Medical History  Diagnosis Date  . Hypertension   . Diabetes mellitus   . Degenerative disc disease   . Osteoporosis   . Hyperlipidemia   . IBS (irritable bowel syndrome)   . Fibromyalgia    BP 114/66  Pulse 82  Resp 14  Ht 5\' 3"  (1.6 m)  Wt 145 lb 9.6 oz (66.044 kg)  BMI 25.8 kg/m2  SpO2 93%     Review of Systems  HENT: Positive for neck pain.   Genitourinary:       Bladder control issues  Musculoskeletal: Positive for back pain and gait problem.       Spasms  Neurological: Positive for weakness and numbness.       Tingling  All other systems reviewed and are negative.       Objective:   Physical Exam Constitutional: She is oriented to person, place, and time. She appears well-developed and well-nourished.  HENT:  Head: Normocephalic.  Neck: Neck supple.  Musculoskeletal: She exhibits tenderness.  Neurological: She is alert and oriented to person, place, and time.  Skin: Skin is warm and dry.  Psychiatric: She has a normal mood and affect.  Symmetric normal motor tone is noted throughout. Normal muscle bulk. Muscle testing  reveals 5/5 muscle strength of the upper extremity, and 5/5 of the lower extremity. Full range of motion in upper and lower extremities. ROM of spine is restricted. Fine motor movements are normal in both hands.  Sensory is intact and symmetric to light touch, pinprick and proprioception.  DTR in the upper and lower extremity are present and symmetric 2+. No clonus is noted.  Patient arises from chair without difficulty. Narrow based gait with normal arm swing bilateral , able to walk on heels and toes . Tandem walk is stable. No pronator drift.        Assessment & Plan:  1. Bilateral sacroiliac joint dysfunction, status post radio  frequencies which were extremely helpful.  2. Chronic pain syndrome  consistent with fibromyalgia.  3. Type 2 diabetes.  Patient is doing fairly well today. She uses a heating pad regularly to get relief  Advised patient to continue with her exercise and walking program, advised patient to do her rubber-band exercises in a lying position, to keep her neck in a good position, also showed her how to correct her posture, continue with your exercising in the pool. Refilled her hydrocodone, and opana today. Also filled her Cymbalta, and prescribed Robaxin, her insurance is not paying for the Flexeril anymore and not paying for the Robaxin neither. The insurance is paying for Tizanidine, the patient is already taking this medication , but only at night time, gave her a new prescription for tizanidine 2mg  , tid, # 90 prn muscle spasms.  Discussed, at the last visit, with the patient that she was late several times to her visits (5/7), including today, without any legitimate reason, she also had some last minute cancelations. I told her that if she is late that throws off our whole schedule and that the next patients have to wait although they were on time. I told her that if this happens again, we will d/c her. The patient understood, and stated, that she will be on time from now on. Patient was on time today.  Follow up in one month

## 2013-01-19 NOTE — Patient Instructions (Signed)
Continue with your walking program, and with the exercising in the pool.

## 2013-02-16 ENCOUNTER — Encounter: Payer: Self-pay | Admitting: Physical Medicine and Rehabilitation

## 2013-02-16 ENCOUNTER — Encounter
Payer: Medicare Other | Attending: Physical Medicine and Rehabilitation | Admitting: Physical Medicine and Rehabilitation

## 2013-02-16 VITALS — BP 116/72 | HR 85 | Resp 14 | Ht 63.0 in | Wt 144.0 lb

## 2013-02-16 DIAGNOSIS — M533 Sacrococcygeal disorders, not elsewhere classified: Secondary | ICD-10-CM | POA: Insufficient documentation

## 2013-02-16 DIAGNOSIS — M47817 Spondylosis without myelopathy or radiculopathy, lumbosacral region: Secondary | ICD-10-CM

## 2013-02-16 DIAGNOSIS — IMO0002 Reserved for concepts with insufficient information to code with codable children: Secondary | ICD-10-CM

## 2013-02-16 DIAGNOSIS — IMO0001 Reserved for inherently not codable concepts without codable children: Secondary | ICD-10-CM

## 2013-02-16 DIAGNOSIS — E119 Type 2 diabetes mellitus without complications: Secondary | ICD-10-CM | POA: Insufficient documentation

## 2013-02-16 DIAGNOSIS — M461 Sacroiliitis, not elsewhere classified: Secondary | ICD-10-CM

## 2013-02-16 DIAGNOSIS — G894 Chronic pain syndrome: Secondary | ICD-10-CM | POA: Insufficient documentation

## 2013-02-16 MED ORDER — HYDROCODONE-ACETAMINOPHEN 5-325 MG PO TABS: 1.0000 | ORAL_TABLET | Freq: Four times a day (QID) | ORAL | Status: DC | PRN

## 2013-02-16 MED ORDER — OXYMORPHONE HCL ER 10 MG PO T12A: 10.0000 mg | EXTENDED_RELEASE_TABLET | Freq: Two times a day (BID) | ORAL | Status: DC

## 2013-02-16 NOTE — Progress Notes (Signed)
Subjective:    Patient ID: Shannon French, female    DOB: [birth year], 47 y.o.   MRN: [id number]  HPI The patient complains about chronic back pain which radiates into her left posterior hip. The patient states that she is doing fairly well today.  The problem is stable. The patient states that she stays pretty active she walks regularly and does a HEP regularly, too.  She reports that she is walking 40 min per day now , and she is working out in the pool almost daily.  Pain Inventory Average Pain 5 Pain Right Now 4 My pain is sharp, burning, stabbing, tingling and aching  In the last 24 hours, has pain interfered with the following? General activity 5 Relation with others 5 Enjoyment of life 5 What TIME of day is your pain at its worst? all Sleep (in general) Fair  Pain is worse with: walking, bending, sitting, inactivity, standing and some activites Pain improves with: rest, heat/ice, therapy/exercise, pacing activities and medication Relief from Meds: 5  Mobility walk without assistance use a cane how many minutes can you walk? 40 ability to climb steps?  yes do you drive?  yes  Function disabled: date disabled 2003  Neuro/Psych bladder control problems weakness numbness tingling trouble walking spasms  Prior Studies Any changes since last visit?  no  Physicians involved in your care Any changes since last visit?  no   Family History  Problem Relation Age of Onset  . Cancer Father    History   Social History  . Marital Status: Single    Spouse Name: N/A    Number of Children: N/A  . Years of Education: N/A   Social History Main Topics  . Smoking status: Former Smoker    Quit date: 10/14/2009  . Smokeless tobacco: Never Used  . Alcohol Use: No  . Drug Use: No  . Sexually Active: None   Other Topics Concern  . None   Social History Narrative  . None   Past Surgical History  Procedure Laterality Date  . Cholecystectomy    . Appendectomy    .  Abdominal hysterectomy    . Ovary surgery     Past Medical History  Diagnosis Date  . Hypertension   . Diabetes mellitus   . Degenerative disc disease   . Osteoporosis   . Hyperlipidemia   . IBS (irritable bowel syndrome)   . Fibromyalgia    BP 116/72  Pulse 85  Resp 14  Ht 5\' 3"  (1.6 m)  Wt 144 lb (65.318 kg)  BMI 25.51 kg/m2  SpO2 95%     Review of Systems  Genitourinary:       Bladder control  Musculoskeletal: Positive for back pain and gait problem.       Spasms  Neurological: Positive for weakness and numbness.       Tingling  All other systems reviewed and are negative.       Objective:   Physical Exam Constitutional: She is oriented to person, place, and time. She appears well-developed and well-nourished.  HENT:  Head: Normocephalic.  Neck: Neck supple.  Musculoskeletal: She exhibits tenderness.  Neurological: She is alert and oriented to person, place, and time.  Skin: Skin is warm and dry.  Psychiatric: She has a normal mood and affect.  Symmetric normal motor tone is noted throughout. Normal muscle bulk. Muscle testing reveals 5/5 muscle strength of the upper extremity, and 5/5 of the lower extremity. Full range of motion in upper  and lower extremities. ROM of spine is restricted. Fine motor movements are normal in both hands.  Sensory is intact and symmetric to light touch, pinprick and proprioception.  DTR in the upper and lower extremity are present and symmetric 2+. No clonus is noted.  Patient arises from chair without difficulty. Narrow based gait with normal arm swing bilateral , able to walk on heels and toes . Tandem walk is stable. No pronator drift        Assessment & Plan:  1. Bilateral sacroiliac joint dysfunction, status post radio  frequencies which were extremely helpful.  2. Chronic pain syndrome consistent with fibromyalgia.  3. Type 2 diabetes.  Patient is doing fairly well today. She uses a heating pad regularly to get relief   Advised patient to continue with her exercise and walking program, advised patient to do her rubber-band exercises in a lying position, to keep her neck in a good position, also showed her how to correct her posture, continue with your exercising in the pool. Refilled her hydrocodone, and opana today. Also filled her Cymbalta, and prescribed Robaxin, her insurance is not paying for the Flexeril anymore and not paying for the Robaxin neither. The insurance is paying for Tizanidine, the patient is already taking this medication , but only at night time, gave her a new prescription for tizanidine 2mg  , tid, # 90 prn muscle spasms.  Discussed, at the last visit, with the patient that she was late several times to her visits (5/7), without any legitimate reason, she also had some last minute cancelations. I told her that if she is late that throws off our whole schedule and that the next patients have to wait although they were on time. I told her that if this happens again, we will d/c her. The patient understood, and stated, that she will be on time from now on. Patient was on time today.  Follow up in one month

## 2013-02-16 NOTE — Patient Instructions (Signed)
Continue with your walking and exercise program 

## 2013-02-23 ENCOUNTER — Other Ambulatory Visit: Payer: Self-pay | Admitting: Physical Medicine and Rehabilitation

## 2013-03-17 ENCOUNTER — Encounter: Payer: Self-pay | Admitting: Physical Medicine and Rehabilitation

## 2013-03-17 ENCOUNTER — Encounter
Payer: Medicare Other | Attending: Physical Medicine and Rehabilitation | Admitting: Physical Medicine and Rehabilitation

## 2013-03-17 VITALS — BP 141/82 | HR 79 | Resp 14 | Ht 63.0 in | Wt 142.0 lb

## 2013-03-17 DIAGNOSIS — G894 Chronic pain syndrome: Secondary | ICD-10-CM | POA: Insufficient documentation

## 2013-03-17 DIAGNOSIS — M47817 Spondylosis without myelopathy or radiculopathy, lumbosacral region: Secondary | ICD-10-CM

## 2013-03-17 DIAGNOSIS — E119 Type 2 diabetes mellitus without complications: Secondary | ICD-10-CM | POA: Insufficient documentation

## 2013-03-17 DIAGNOSIS — IMO0002 Reserved for concepts with insufficient information to code with codable children: Secondary | ICD-10-CM

## 2013-03-17 DIAGNOSIS — M461 Sacroiliitis, not elsewhere classified: Secondary | ICD-10-CM

## 2013-03-17 DIAGNOSIS — M533 Sacrococcygeal disorders, not elsewhere classified: Secondary | ICD-10-CM | POA: Insufficient documentation

## 2013-03-17 DIAGNOSIS — Z79899 Other long term (current) drug therapy: Secondary | ICD-10-CM

## 2013-03-17 DIAGNOSIS — Z5181 Encounter for therapeutic drug level monitoring: Secondary | ICD-10-CM

## 2013-03-17 DIAGNOSIS — M25559 Pain in unspecified hip: Secondary | ICD-10-CM | POA: Insufficient documentation

## 2013-03-17 DIAGNOSIS — IMO0001 Reserved for inherently not codable concepts without codable children: Secondary | ICD-10-CM

## 2013-03-17 MED ORDER — HYDROCODONE-ACETAMINOPHEN 5-325 MG PO TABS: 1.0000 | ORAL_TABLET | Freq: Four times a day (QID) | ORAL | Status: DC | PRN

## 2013-03-17 MED ORDER — OXYMORPHONE HCL ER 10 MG PO T12A: 10.0000 mg | EXTENDED_RELEASE_TABLET | Freq: Two times a day (BID) | ORAL | Status: DC

## 2013-03-17 NOTE — Progress Notes (Signed)
Subjective:    Patient ID: Shannon French, female    DOB: 02-Jan-1966, 47 y.o.   MRN: [id number]  HPI The patient complains about chronic back pain which radiates into her left posterior hip. The patient states that she is doing fairly well today.  The problem is stable. The patient states that she stays pretty active she walks regularly and does a HEP regularly, too.  She reports that she is walking 40 min per day now , and she is working out in the pool almost daily. She did a little less exercising, and had a little more pain , because of the wet weather. Otherwise the problem has been stable. Pain Inventory Average Pain 7 Pain Right Now 9 My pain is constant, sharp, burning, stabbing, tingling and aching  In the last 24 hours, has pain interfered with the following? General activity 8 Relation with others 8 Enjoyment of life 8 What TIME of day is your pain at its worst? constant Sleep (in general) Poor  Pain is worse with: walking, bending, sitting, inactivity, standing and some activites Pain improves with: rest, heat/ice, therapy/exercise, pacing activities and medication Relief from Meds: 3  Mobility walk without assistance walk with assistance use a cane how many minutes can you walk? 30 ability to climb steps?  yes do you drive?  yes transfers alone Do you have any goals in this area?  yes  Function disabled: date disabled 2003 I need assistance with the following:  meal prep, household duties and shopping Do you have any goals in this area?  yes  Neuro/Psych weakness numbness tingling trouble walking spasms  Prior Studies Any changes since last visit?  no  Physicians involved in your care Any changes since last visit?  no   Family History  Problem Relation Age of Onset  . Cancer Father    History   Social History  . Marital Status: Single    Spouse Name: N/A    Number of Children: N/A  . Years of Education: N/A   Social History Main Topics  .  Smoking status: Former Smoker    Quit date: 10/14/2009  . Smokeless tobacco: Never Used  . Alcohol Use: No  . Drug Use: No  . Sexual Activity: None   Other Topics Concern  . None   Social History Narrative  . None   Past Surgical History  Procedure Laterality Date  . Cholecystectomy    . Appendectomy    . Abdominal hysterectomy    . Ovary surgery     Past Medical History  Diagnosis Date  . Hypertension   . Diabetes mellitus   . Degenerative disc disease   . Osteoporosis   . Hyperlipidemia   . IBS (irritable bowel syndrome)   . Fibromyalgia    BP 141/82  Pulse 79  Resp 14  Ht 5\' 3"  (1.6 m)  Wt 142 lb (64.411 kg)  BMI 25.16 kg/m2  SpO2 96%     Review of Systems  Musculoskeletal: Positive for myalgias, arthralgias and gait problem.  Neurological: Positive for weakness and numbness.  All other systems reviewed and are negative.       Objective:   Physical Exam Constitutional: She is oriented to person, place, and time. She appears well-developed and well-nourished.  HENT:  Head: Normocephalic.  Neck: Neck supple.  Musculoskeletal: She exhibits tenderness.  Neurological: She is alert and oriented to person, place, and time.  Skin: Skin is warm and dry.  Psychiatric: She has a normal mood  and affect.  Symmetric normal motor tone is noted throughout. Normal muscle bulk. Muscle testing reveals 5/5 muscle strength of the upper extremity, and 5/5 of the lower extremity. Full range of motion in upper and lower extremities. ROM of spine is restricted. Fine motor movements are normal in both hands.  Sensory is intact and symmetric to light touch, pinprick and proprioception.  DTR in the upper and lower extremity are present and symmetric 2+. No clonus is noted.  Patient arises from chair without difficulty. Narrow based gait with normal arm swing bilateral , able to walk on heels and toes . Tandem walk is stable. No pronator drift        Assessment & Plan:  1.  Bilateral sacroiliac joint dysfunction, status post radio  frequencies which were extremely helpful.  2. Chronic pain syndrome consistent with fibromyalgia.  3. Type 2 diabetes.  Patient is doing fairly well today. She uses a heating pad regularly to get relief  Advised patient to continue with her exercise and walking program, advised patient to do her rubber-band exercises in a lying position, to keep her neck in a good position, also showed her how to correct her posture, continue with your exercising in the pool. Refilled her hydrocodone, and opana today. Also filled her Cymbalta, and prescribed Robaxin, her insurance is not paying for the Flexeril anymore and not paying for the Robaxin neither. The insurance is paying for Tizanidine, the patient is already taking this medication , but only at night time, gave her a new prescription for tizanidine 2mg  , tid, # 90 prn muscle spasms.  Discussed, at the last visit, with the patient that she was late several times to her visits (5/7), without any legitimate reason, she also had some last minute cancelations. I told her that if she is late that throws off our whole schedule and that the next patients have to wait although they were on time. I told her that if this happens again, we will d/c her. The patient understood, and stated, that she will be on time from now on. Patient was on time today.  Follow up in one month

## 2013-03-17 NOTE — Patient Instructions (Signed)
Continue with your exercise and walking program 

## 2013-04-19 ENCOUNTER — Encounter: Payer: Medicare Other | Admitting: Physical Medicine and Rehabilitation

## 2013-04-21 ENCOUNTER — Encounter
Payer: Medicare Other | Attending: Physical Medicine and Rehabilitation | Admitting: Physical Medicine and Rehabilitation

## 2013-04-21 ENCOUNTER — Ambulatory Visit (HOSPITAL_COMMUNITY)
Admission: RE | Admit: 2013-04-21 | Discharge: 2013-04-21 | Disposition: A | Discharge status: Home / Self Care | Payer: Medicare Other | Source: Ambulatory Visit | Attending: Physical Medicine and Rehabilitation | Admitting: Physical Medicine and Rehabilitation

## 2013-04-21 ENCOUNTER — Encounter: Payer: Self-pay | Admitting: Physical Medicine and Rehabilitation

## 2013-04-21 VITALS — BP 126/78 | HR 91 | Resp 14 | Ht 63.0 in | Wt 142.4 lb

## 2013-04-21 DIAGNOSIS — E119 Type 2 diabetes mellitus without complications: Secondary | ICD-10-CM | POA: Insufficient documentation

## 2013-04-21 DIAGNOSIS — M542 Cervicalgia: Secondary | ICD-10-CM

## 2013-04-21 DIAGNOSIS — IMO0002 Reserved for concepts with insufficient information to code with codable children: Secondary | ICD-10-CM

## 2013-04-21 DIAGNOSIS — IMO0001 Reserved for inherently not codable concepts without codable children: Secondary | ICD-10-CM

## 2013-04-21 DIAGNOSIS — M79609 Pain in unspecified limb: Secondary | ICD-10-CM | POA: Insufficient documentation

## 2013-04-21 DIAGNOSIS — M461 Sacroiliitis, not elsewhere classified: Secondary | ICD-10-CM

## 2013-04-21 DIAGNOSIS — W19XXXA Unspecified fall, initial encounter: Secondary | ICD-10-CM | POA: Insufficient documentation

## 2013-04-21 DIAGNOSIS — R209 Unspecified disturbances of skin sensation: Secondary | ICD-10-CM | POA: Insufficient documentation

## 2013-04-21 DIAGNOSIS — G894 Chronic pain syndrome: Secondary | ICD-10-CM | POA: Insufficient documentation

## 2013-04-21 DIAGNOSIS — M47817 Spondylosis without myelopathy or radiculopathy, lumbosacral region: Secondary | ICD-10-CM

## 2013-04-21 DIAGNOSIS — M533 Sacrococcygeal disorders, not elsewhere classified: Secondary | ICD-10-CM | POA: Insufficient documentation

## 2013-04-21 MED ORDER — HYDROCODONE-ACETAMINOPHEN 5-325 MG PO TABS: 1.0000 | ORAL_TABLET | Freq: Four times a day (QID) | ORAL | Status: DC | PRN

## 2013-04-21 MED ORDER — DICLOFENAC SODIUM 1 % TD GEL: 2.0000 g | Freq: Four times a day (QID) | TRANSDERMAL | Status: DC

## 2013-04-21 MED ORDER — TIZANIDINE HCL 2 MG PO CAPS: 2.0000 mg | ORAL_CAPSULE | Freq: Three times a day (TID) | ORAL | Status: DC

## 2013-04-21 MED ORDER — MELOXICAM 15 MG PO TABS: ORAL_TABLET | ORAL | Status: DC

## 2013-04-21 MED ORDER — OXYMORPHONE HCL ER 10 MG PO T12A: 10.0000 mg | EXTENDED_RELEASE_TABLET | Freq: Two times a day (BID) | ORAL | Status: DC

## 2013-04-21 NOTE — Patient Instructions (Addendum)
Continue with your exercise and walking program, as pain permits. Continue with applying heat to your neck, rest your neck, and do the exercises we talked about with the theraband, and the posture exercises, as tolerated. If your pain gets too severe, or you experience severe weakness, call us, or go to the ED if this happens when our office is closed.

## 2013-04-21 NOTE — Progress Notes (Signed)
Subjective:    Patient ID: Shannon French, female    DOB: 09/05/1965, 47 y.o.   MRN: [id number]  HPI The patient is a 47 year old female, who presents with left sided neck pain . The symptoms started 3 weeks ago. The patient complains about moderate to severe pain, which radiate into her left UE in a C7/8 distribution.  Patient also complains about numbness and tingling in her 4th and 5th finger .She describes the pain as stabbing . Applying heat, taking medications , changing positions alleviate the symptoms. Prolonged activity and movement of the neck aggrevates the symptoms. The patient grades her pain as a  9/10. The patient also complains about chronic back pain which radiates into her left posterior hip. The patient states that she is doing fairly well today.  The problem is stable. The patient states that she stays pretty active she walks regularly and does a HEP regularly, too.  She reports that she is walking 40 min per day now.  Pain Inventory Average Pain 8 Pain Right Now 9 My pain is constant, sharp, burning, stabbing, tingling and aching  In the last 24 hours, has pain interfered with the following? General activity 8 Relation with others 8 Enjoyment of life 8 What TIME of day is your pain at its worst? all day Sleep (in general) Poor  Pain is worse with: walking, bending, sitting, inactivity, standing and some activites Pain improves with: rest, heat/ice, therapy/exercise, pacing activities and medication Relief from Meds: 3  Mobility walk without assistance walk with assistance use a cane how many minutes can you walk? 45 ability to climb steps?  yes do you drive?  yes Do you have any goals in this area?  yes  Function disabled: date disabled 2003 I need assistance with the following:  meal prep, household duties and shopping Do you have any goals in this area?  yes  Neuro/Psych weakness numbness tingling trouble walking spasms  Prior Studies Any changes  since last visit?  no  Physicians involved in your care Any changes since last visit?  no   Family History  Problem Relation Age of Onset  . Cancer Father    History   Social History  . Marital Status: Single    Spouse Name: N/A    Number of Children: N/A  . Years of Education: N/A   Social History Main Topics  . Smoking status: Former Smoker    Quit date: 10/14/2009  . Smokeless tobacco: Never Used  . Alcohol Use: No  . Drug Use: No  . Sexual Activity: None   Other Topics Concern  . None   Social History Narrative  . None   Past Surgical History  Procedure Laterality Date  . Cholecystectomy    . Appendectomy    . Abdominal hysterectomy    . Ovary surgery     Past Medical History  Diagnosis Date  . Hypertension   . Diabetes mellitus   . Degenerative disc disease   . Osteoporosis   . Hyperlipidemia   . IBS (irritable bowel syndrome)   . Fibromyalgia   . Arm pain     left   BP 126/78  Pulse 91  Resp 14  Ht 5\' 3"  (1.6 m)  Wt 142 lb 6.4 oz (64.592 kg)  BMI 25.23 kg/m2  SpO2 97%    Review of Systems  Musculoskeletal: Positive for gait problem.  Neurological: Positive for weakness and numbness.       Spasms, tingling  Objective:   Physical Exam Constitutional: She is oriented to person, place, and time. She appears well-developed and well-nourished.  HENT:  Head: Normocephalic.  Neck: Neck supple.  Musculoskeletal: She exhibits tenderness.  Neurological: She is alert and oriented to person, place, and time.  Skin: Skin is warm and dry.  Psychiatric: She has a normal mood and affect.  Symmetric normal motor tone is noted throughout. Normal muscle bulk. Muscle testing reveals 5/5 muscle strength of the upper extremity,except left deltoid 4-/5, and left triceps 4-/5, with pain; and 5/5 of the lower extremity. Full range of motion in upper and lower extremities. ROM of spine is restricted.Pain and restriction with rotation to the right,  rotation to the left is without pain, full ROM.  Fine motor movements are normal in both hands.  Sensory is intact and symmetric to light touch, pinprick and proprioception.  DTR in the upper and lower extremity are present and symmetric 2+. No clonus is noted.  Patient arises from chair without difficulty. Narrow based gait with normal arm swing bilateral , able to walk on heels and toes . Tandem walk is stable. No pronator drift        Assessment & Plan:  1. Bilateral sacroiliac joint dysfunction, status post radio  frequencies which were extremely helpful.  2. Chronic pain syndrome consistent with fibromyalgia.  3.Left sided neck pain, radiating into the LUE in a C7/8 distribution, started 3 weeks ago, has been stable. Ordered X-rays, consider MRI if Sx don't improve. Patient can increase her muscle relaxant, from 2mg  tid;  to 2 mg in the am, and at noon, and 4mg  at bedtime .Prescribed Voltaren gel for her neck and left shoulder pain.  4.Type 2 diabetes.  Otherwise she is doing fairly well . She uses a heating pad regularly to get relief  Advised patient to continue with her exercise and walking program, advised patient to do her rubber-band exercises in a lying position, to keep her neck in a good position, also showed her how to correct her posture.  Refilled her hydrocodone, opana, and her tizanidine 2mg  , tid - qid, # 120 prn muscle spasms, today.    Discussed, at the last visit, with the patient that she was late several times to her visits (5/7), without any legitimate reason, she also had some last minute cancelations. I told her that if she is late that throws off our whole schedule and that the next patients have to wait although they were on time. I told her that if this happens again, we will d/c her. The patient understood, and stated, that she will be on time from now on. Patient was on time today.  Follow up in one month

## 2013-04-22 ENCOUNTER — Telehealth: Payer: Self-pay

## 2013-04-22 NOTE — Telephone Encounter (Signed)
Message copied by Judd Gaudier on Thu Apr 22, 2013  9:03 AM ------      Message from: Su Monks      Created: Wed Apr 21, 2013 12:31 PM       Please call patient, everything looks fine on the x-ray, if her Sx do not improve we might consider MRI ------

## 2013-04-22 NOTE — Telephone Encounter (Signed)
Patient informed of imaging results.

## 2013-05-26 ENCOUNTER — Encounter
Payer: Medicare Other | Attending: Physical Medicine and Rehabilitation | Admitting: Physical Medicine and Rehabilitation

## 2013-05-26 ENCOUNTER — Encounter: Payer: Self-pay | Admitting: Physical Medicine and Rehabilitation

## 2013-05-26 VITALS — BP 115/73 | HR 75 | Resp 14 | Ht 63.0 in | Wt 143.2 lb

## 2013-05-26 DIAGNOSIS — M461 Sacroiliitis, not elsewhere classified: Secondary | ICD-10-CM

## 2013-05-26 DIAGNOSIS — IMO0002 Reserved for concepts with insufficient information to code with codable children: Secondary | ICD-10-CM

## 2013-05-26 DIAGNOSIS — M79609 Pain in unspecified limb: Secondary | ICD-10-CM | POA: Insufficient documentation

## 2013-05-26 DIAGNOSIS — M533 Sacrococcygeal disorders, not elsewhere classified: Secondary | ICD-10-CM | POA: Insufficient documentation

## 2013-05-26 DIAGNOSIS — M542 Cervicalgia: Secondary | ICD-10-CM | POA: Insufficient documentation

## 2013-05-26 DIAGNOSIS — E119 Type 2 diabetes mellitus without complications: Secondary | ICD-10-CM | POA: Insufficient documentation

## 2013-05-26 DIAGNOSIS — IMO0001 Reserved for inherently not codable concepts without codable children: Secondary | ICD-10-CM

## 2013-05-26 DIAGNOSIS — G894 Chronic pain syndrome: Secondary | ICD-10-CM | POA: Insufficient documentation

## 2013-05-26 MED ORDER — HYDROCODONE-ACETAMINOPHEN 5-325 MG PO TABS: 1.0000 | ORAL_TABLET | Freq: Four times a day (QID) | ORAL | Status: DC | PRN

## 2013-05-26 MED ORDER — LIDOCAINE 5 % EX PTCH: 1.0000 | MEDICATED_PATCH | CUTANEOUS | Status: DC

## 2013-05-26 MED ORDER — OXYMORPHONE HCL ER 10 MG PO T12A: 10.0000 mg | EXTENDED_RELEASE_TABLET | Freq: Two times a day (BID) | ORAL | Status: DC

## 2013-05-26 NOTE — Progress Notes (Signed)
Subjective:    Patient ID: Shannon French, female    DOB: 10-Jun-1966, 47 y.o.   MRN: [id number]  HPI The patient is a 47 year old female, who presents with left sided neck pain . The symptoms started 3 weeks ago. The patient complains about moderate to severe pain, which radiate into her left UE in a C7/8 distribution. Patient also complains about numbness and tingling in her 4th and 5th finger .She describes the pain as stabbing . Applying heat, taking medications , changing positions alleviate the symptoms. Prolonged activity and movement of the neck aggrevates the symptoms. The patient grades her pain as a 9/10.  The patient also complains about chronic back pain which radiates into her left posterior hip. The patient states that she is doing fairly well today.  The problem is stable. The patient states that she stays pretty active she walks regularly and does a HEP regularly, too.  She reports that she is walking 40 min per day now.  Pain Inventory Average Pain 8 Pain Right Now 9 My pain is constant, sharp, burning, stabbing and aching  In the last 24 hours, has pain interfered with the following? General activity 8 Relation with others 8 Enjoyment of life 8 What TIME of day is your pain at its worst? all day Sleep (in general) Poor  Pain is worse with: walking, bending, sitting, inactivity, standing and some activites Pain improves with: rest, heat/ice, therapy/exercise, pacing activities and medication Relief from Meds: 4  Mobility walk without assistance walk with assistance use a cane how many minutes can you walk? 45 ability to climb steps?  yes do you drive?  yes Do you have any goals in this area?  yes  Function disabled: date disabled 2003 I need assistance with the following:  meal prep, household duties and shopping Do you have any goals in this area?  yes  Neuro/Psych weakness numbness tingling trouble walking spasms  Prior Studies Any changes since last  visit?  no  Physicians involved in your care Any changes since last visit?  no   Family History  Problem Relation Age of Onset  . Cancer Father    History   Social History  . Marital Status: Single    Spouse Name: N/A    Number of Children: N/A  . Years of Education: N/A   Social History Main Topics  . Smoking status: Former Smoker    Quit date: 10/14/2009  . Smokeless tobacco: Never Used  . Alcohol Use: No  . Drug Use: No  . Sexual Activity: Not on file   Other Topics Concern  . Not on file   Social History Narrative  . No narrative on file   Past Surgical History  Procedure Laterality Date  . Cholecystectomy    . Appendectomy    . Abdominal hysterectomy    . Ovary surgery     Past Medical History  Diagnosis Date  . Hypertension   . Diabetes mellitus   . Degenerative disc disease   . Osteoporosis   . Hyperlipidemia   . IBS (irritable bowel syndrome)   . Fibromyalgia   . Arm pain     left   There were no vitals taken for this visit.      Review of Systems  Musculoskeletal: Positive for back pain, gait problem and neck pain.  Neurological: Positive for weakness and numbness.       Spasms, tingling  All other systems reviewed and are negative.  Objective:   Physical Exam Constitutional: She is oriented to person, place, and time. She appears well-developed and well-nourished.  HENT:  Head: Normocephalic.  Neck: Neck supple.  Musculoskeletal: She exhibits tenderness.  Neurological: She is alert and oriented to person, place, and time.  Skin: Skin is warm and dry.  Psychiatric: She has a normal mood and affect.  Symmetric normal motor tone is noted throughout. Normal muscle bulk. Muscle testing reveals 5/5 muscle strength of the upper extremity,except left deltoid 4-/5, and left triceps 4-/5, with pain; and 5/5 of the lower extremity. Full range of motion in upper and lower extremities. ROM of spine is restricted.Pain and restriction with  rotation to the right, rotation to the left is without pain, full ROM.  Fine motor movements are normal in both hands.  Sensory is intact and symmetric to light touch, pinprick and proprioception.  DTR in the upper and lower extremity are present and symmetric 2+. No clonus is noted.  Patient arises from chair without difficulty. Narrow based gait with normal arm swing bilateral , able to walk on heels and toes . Tandem walk is stable. No pronator drift        Assessment & Plan:  1. Bilateral sacroiliac joint dysfunction, status post radio  frequencies which were extremely helpful.  2. Chronic pain syndrome consistent with fibromyalgia.  3.Left sided neck pain, radiating into the LUE in a C7/8 distribution, started 3 weeks ago, has been stable. Ordered Sheran Fava did not show significant findings, consider MRI if Sx don't improve. Patient can increase her muscle relaxant, from 2mg  tid; to 2 mg in the am, and at noon, and 4mg  at bedtime .Prescribed Voltaren gel for her neck and left shoulder pain.  4.Type 2 diabetes.  Otherwise she is doing fairly well . She uses a heating pad regularly to get relief  Advised patient to continue with her exercise and walking program, advised patient to do her rubber-band exercises in a lying position, to keep her neck in a good position, also showed her how to correct her posture.  Refilled her hydrocodone, opana, today.   Discussed, several month ago, with the patient that she was late several times to her visits (5/7), without any legitimate reason, she also had some last minute cancelations. I told her that if she is late that throws off our whole schedule and that the next patients have to wait although they were on time. I told her that if this happens again, we will d/c her. The patient understood, and stated, that she will be on time from now on. Patient was on time the last 4 month .  Follow up in one month

## 2013-05-26 NOTE — Patient Instructions (Signed)
Continue with your walking and exercise program as tolerated

## 2013-05-31 ENCOUNTER — Telehealth: Payer: Self-pay

## 2013-05-31 NOTE — Telephone Encounter (Signed)
FYI:: Patient called and want to inform you that her Tizanidine was covered and that's why she didn't call back, she didn't need the assistance.

## 2013-06-25 ENCOUNTER — Encounter: Payer: Self-pay | Admitting: Physical Medicine and Rehabilitation

## 2013-06-25 ENCOUNTER — Encounter
Payer: Medicare Other | Attending: Physical Medicine and Rehabilitation | Admitting: Physical Medicine and Rehabilitation

## 2013-06-25 VITALS — BP 153/86 | HR 75 | Resp 14 | Ht 63.0 in | Wt 147.2 lb

## 2013-06-25 DIAGNOSIS — M533 Sacrococcygeal disorders, not elsewhere classified: Secondary | ICD-10-CM | POA: Insufficient documentation

## 2013-06-25 DIAGNOSIS — M542 Cervicalgia: Secondary | ICD-10-CM | POA: Insufficient documentation

## 2013-06-25 DIAGNOSIS — E119 Type 2 diabetes mellitus without complications: Secondary | ICD-10-CM | POA: Insufficient documentation

## 2013-06-25 DIAGNOSIS — IMO0002 Reserved for concepts with insufficient information to code with codable children: Secondary | ICD-10-CM

## 2013-06-25 DIAGNOSIS — M47817 Spondylosis without myelopathy or radiculopathy, lumbosacral region: Secondary | ICD-10-CM

## 2013-06-25 DIAGNOSIS — G894 Chronic pain syndrome: Secondary | ICD-10-CM | POA: Insufficient documentation

## 2013-06-25 DIAGNOSIS — IMO0001 Reserved for inherently not codable concepts without codable children: Secondary | ICD-10-CM

## 2013-06-25 DIAGNOSIS — M461 Sacroiliitis, not elsewhere classified: Secondary | ICD-10-CM

## 2013-06-25 MED ORDER — HYDROCODONE-ACETAMINOPHEN 5-325 MG PO TABS: 1.0000 | ORAL_TABLET | Freq: Four times a day (QID) | ORAL | Status: DC | PRN

## 2013-06-25 MED ORDER — OXYMORPHONE HCL ER 10 MG PO T12A: 10.0000 mg | EXTENDED_RELEASE_TABLET | Freq: Two times a day (BID) | ORAL | Status: DC

## 2013-06-25 NOTE — Patient Instructions (Signed)
Continue with your exercise program and your walking as tolerated

## 2013-06-25 NOTE — Progress Notes (Signed)
Subjective:    Patient ID: Shannon French, female    DOB: 20-Sep-1965, 47 y.o.   MRN: [id number]  HPI The patient is a 47 year old female, who presents with left sided neck pain . The symptoms started 7 weeks ago. The patient complains about moderate to severe pain, which radiate into her left UE in a C7/8 distribution. Patient also complains about numbness and tingling in her 4th and 5th finger .She describes the pain as stabbing . Applying heat, taking medications , changing positions alleviate the symptoms. Prolonged activity and movement of the neck aggrevates the symptoms. The patient grades her pain as a 9/10.  The patient also complains about chronic back pain which radiates into her left posterior hip. The patient states that she is doing fairly well today.  The problem is stable. The patient states that she stays pretty active she walks regularly and does a HEP regularly, too.  She reports that she is walking 40 min per day now.  Pain Inventory Average Pain 8 Pain Right Now 8 My pain is constant, sharp, burning, stabbing, tingling and aching  In the last 24 hours, has pain interfered with the following? General activity 8 Relation with others 8 Enjoyment of life 8 What TIME of day is your pain at its worst? all Sleep (in general) Poor  Pain is worse with: walking, bending, sitting, inactivity, standing and some activites Pain improves with: rest, heat/ice, therapy/exercise, pacing activities and medication Relief from Meds: 5  Mobility walk without assistance how many minutes can you walk? 45 ability to climb steps?  yes do you drive?  yes  Function disabled: date disabled 2003 I need assistance with the following:  meal prep, household duties and shopping  Neuro/Psych weakness numbness tingling spasms  Prior Studies Any changes since last visit?  no  Physicians involved in your care Any changes since last visit?  no   Family History  Problem Relation Age of  Onset  . Cancer Father    History   Social History  . Marital Status: Single    Spouse Name: N/A    Number of Children: N/A  . Years of Education: N/A   Social History Main Topics  . Smoking status: Former Smoker    Quit date: 10/14/2009  . Smokeless tobacco: Never Used  . Alcohol Use: No  . Drug Use: No  . Sexual Activity: None   Other Topics Concern  . None   Social History Narrative  . None   Past Surgical History  Procedure Laterality Date  . Cholecystectomy    . Appendectomy    . Abdominal hysterectomy    . Ovary surgery     Past Medical History  Diagnosis Date  . Hypertension   . Diabetes mellitus   . Degenerative disc disease   . Osteoporosis   . Hyperlipidemia   . IBS (irritable bowel syndrome)   . Fibromyalgia   . Arm pain     left   BP 153/86  Pulse 75  Resp 14  Ht 5\' 3"  (1.6 m)  Wt 147 lb 3.2 oz (66.769 kg)  BMI 26.08 kg/m2  SpO2 98%    Review of Systems  Musculoskeletal:       Spasms  Neurological: Positive for weakness and numbness.       Tingling  All other systems reviewed and are negative.       Objective:   Physical Exam Constitutional: She is oriented to person, place, and time. She appears  well-developed and well-nourished.  HENT:  Head: Normocephalic.  Neck: Neck supple.  Musculoskeletal: She exhibits tenderness.  Neurological: She is alert and oriented to person, place, and time.  Skin: Skin is warm and dry.  Psychiatric: She has a normal mood and affect.  Symmetric normal motor tone is noted throughout. Normal muscle bulk. Muscle testing reveals 5/5 muscle strength of the upper extremity,except left deltoid 4-/5, and left triceps 4-/5, with pain; and 5/5 of the lower extremity. Full range of motion in upper and lower extremities. ROM of spine is restricted.Pain and restriction with rotation to the right, rotation to the left is without pain, full ROM.  Fine motor movements are normal in both hands.  Sensory is intact  and symmetric to light touch, pinprick and proprioception.  DTR in the upper and lower extremity are present and symmetric 2+. No clonus is noted.  Patient arises from chair without difficulty. Narrow based gait with normal arm swing bilateral , able to walk on heels and toes . Tandem walk is stable. No pronator drift        Assessment & Plan:  1. Bilateral sacroiliac joint dysfunction, status post radio  frequencies which were extremely helpful.  2. Chronic pain syndrome consistent with fibromyalgia.  3.Left sided neck pain, radiating into the LUE in a C7/8 distribution, started 3 weeks ago, has been stable. Ordered Sheran Fava did not show significant findings, consider MRI if Sx don't improve. Patient can increase her muscle relaxant, from 2mg  tid; to 2 mg in the am, and at noon, and 4mg  at bedtime .Prescribed Voltaren gel for her neck and left shoulder pain.  4.Type 2 diabetes.  Otherwise she is doing fairly well . She uses a heating pad regularly to get relief  Advised patient to continue with her exercise and walking program, advised patient to do her rubber-band exercises in a lying position, to keep her neck in a good position, also showed her how to correct her posture.  Refilled her hydrocodone, opana, today.  Discussed, several month ago, with the patient that she was late several times to her visits (5/7), without any legitimate reason, she also had some last minute cancelations. I told her that if she is late that throws off our whole schedule and that the next patients have to wait although they were on time. I told her that if this happens again, we will d/c her. The patient understood, and stated, that she will be on time from now on. Patient was on time the last 4 month .  Follow up in one month

## 2013-07-05 ENCOUNTER — Other Ambulatory Visit: Payer: Self-pay | Admitting: Physical Medicine and Rehabilitation

## 2013-07-20 ENCOUNTER — Other Ambulatory Visit: Payer: Self-pay | Admitting: *Deleted

## 2013-07-20 DIAGNOSIS — IMO0001 Reserved for inherently not codable concepts without codable children: Secondary | ICD-10-CM

## 2013-07-20 DIAGNOSIS — IMO0002 Reserved for concepts with insufficient information to code with codable children: Secondary | ICD-10-CM

## 2013-07-20 DIAGNOSIS — M461 Sacroiliitis, not elsewhere classified: Secondary | ICD-10-CM

## 2013-07-20 MED ORDER — OXYMORPHONE HCL ER 10 MG PO T12A: 10.0000 mg | EXTENDED_RELEASE_TABLET | Freq: Two times a day (BID) | ORAL | Status: DC

## 2013-07-20 MED ORDER — HYDROCODONE-ACETAMINOPHEN 5-325 MG PO TABS: 1.0000 | ORAL_TABLET | Freq: Four times a day (QID) | ORAL | Status: DC | PRN

## 2013-07-20 NOTE — Telephone Encounter (Signed)
RX printed early for controlled medication for the visit with RN on 07/26/13 (to be signed by MD) 

## 2013-08-18 ENCOUNTER — Other Ambulatory Visit: Payer: Self-pay

## 2013-08-19 ENCOUNTER — Other Ambulatory Visit: Payer: Self-pay | Admitting: *Deleted

## 2013-08-19 DIAGNOSIS — IMO0002 Reserved for concepts with insufficient information to code with codable children: Secondary | ICD-10-CM

## 2013-08-19 DIAGNOSIS — M461 Sacroiliitis, not elsewhere classified: Secondary | ICD-10-CM

## 2013-08-19 DIAGNOSIS — IMO0001 Reserved for inherently not codable concepts without codable children: Secondary | ICD-10-CM

## 2013-08-19 MED ORDER — HYDROCODONE-ACETAMINOPHEN 5-325 MG PO TABS: 1.0000 | ORAL_TABLET | Freq: Four times a day (QID) | ORAL | Status: DC | PRN

## 2013-08-19 MED ORDER — OXYMORPHONE HCL ER 10 MG PO T12A
10.0000 mg | EXTENDED_RELEASE_TABLET | Freq: Two times a day (BID) | ORAL | Status: DC
Start: 2013-08-19 — End: 2013-09-27

## 2013-08-19 NOTE — Telephone Encounter (Signed)
RX printed early for controlled medication for the visit with RN on 08/24/13 (to be signed by MD) 

## 2013-08-25 ENCOUNTER — Encounter: Payer: Medicare Other | Attending: Physical Medicine & Rehabilitation | Admitting: *Deleted

## 2013-08-25 VITALS — BP 133/58 | HR 76 | Resp 14

## 2013-08-25 DIAGNOSIS — E785 Hyperlipidemia, unspecified: Secondary | ICD-10-CM | POA: Insufficient documentation

## 2013-08-25 DIAGNOSIS — E119 Type 2 diabetes mellitus without complications: Secondary | ICD-10-CM | POA: Insufficient documentation

## 2013-08-25 DIAGNOSIS — Z79899 Other long term (current) drug therapy: Secondary | ICD-10-CM | POA: Insufficient documentation

## 2013-08-25 DIAGNOSIS — M542 Cervicalgia: Secondary | ICD-10-CM | POA: Insufficient documentation

## 2013-08-25 DIAGNOSIS — M47817 Spondylosis without myelopathy or radiculopathy, lumbosacral region: Secondary | ICD-10-CM

## 2013-08-25 DIAGNOSIS — G894 Chronic pain syndrome: Secondary | ICD-10-CM | POA: Insufficient documentation

## 2013-08-25 DIAGNOSIS — M533 Sacrococcygeal disorders, not elsewhere classified: Secondary | ICD-10-CM | POA: Insufficient documentation

## 2013-08-25 DIAGNOSIS — IMO0002 Reserved for concepts with insufficient information to code with codable children: Secondary | ICD-10-CM

## 2013-08-25 DIAGNOSIS — IMO0001 Reserved for inherently not codable concepts without codable children: Secondary | ICD-10-CM | POA: Insufficient documentation

## 2013-08-25 DIAGNOSIS — M461 Sacroiliitis, not elsewhere classified: Secondary | ICD-10-CM

## 2013-08-25 DIAGNOSIS — M810 Age-related osteoporosis without current pathological fracture: Secondary | ICD-10-CM | POA: Insufficient documentation

## 2013-08-25 DIAGNOSIS — I10 Essential (primary) hypertension: Secondary | ICD-10-CM | POA: Insufficient documentation

## 2013-08-25 MED ORDER — TIZANIDINE HCL 2 MG PO TABS: ORAL_TABLET | ORAL | Status: DC

## 2013-08-25 MED ORDER — MELOXICAM 15 MG PO TABS: ORAL_TABLET | ORAL | Status: DC

## 2013-08-25 NOTE — Progress Notes (Signed)
Here for pill count and medication refills. Opana ER 10 mg #60 Fill date 08/07/13   Today NV#25 hydrocodone 5/325 #90 today NV# 17  VSS    No changes in pain levels or medications except tizanidine was reordered and sig changed to be 2mg  q am, 2mg  q noon, and 4 mg q hs, which is how Shannon French noted to increase in last visist note.  It was tid in med list.  Meloxicam reordered and the tizanidine and sent electronically to pharmacy.  She was given refills on her hydrocodone and OpanaER.  Return to see RN for med refills and pill count next month and 2 months to see Dr Riley KillSwartz..  She has had no falls and is low fall risk.  Opioid risk score is 0.

## 2013-08-25 NOTE — Patient Instructions (Signed)
Follow up one month with RN for med refills and two months with Dr Riley KillSwartz

## 2013-09-27 ENCOUNTER — Other Ambulatory Visit: Payer: Self-pay | Admitting: *Deleted

## 2013-09-27 DIAGNOSIS — IMO0002 Reserved for concepts with insufficient information to code with codable children: Secondary | ICD-10-CM

## 2013-09-27 DIAGNOSIS — IMO0001 Reserved for inherently not codable concepts without codable children: Secondary | ICD-10-CM

## 2013-09-27 DIAGNOSIS — M461 Sacroiliitis, not elsewhere classified: Secondary | ICD-10-CM

## 2013-09-27 MED ORDER — HYDROCODONE-ACETAMINOPHEN 5-325 MG PO TABS: 1.0000 | ORAL_TABLET | Freq: Four times a day (QID) | ORAL | Status: DC | PRN

## 2013-09-27 MED ORDER — OXYMORPHONE HCL ER 10 MG PO T12A: 10.0000 mg | EXTENDED_RELEASE_TABLET | Freq: Two times a day (BID) | ORAL | Status: DC

## 2013-09-27 NOTE — Telephone Encounter (Signed)
RX printed early for controlled medication for the visit with RN on 09/29/13 (to be signed by MD) 

## 2013-09-29 ENCOUNTER — Encounter: Payer: Medicare Other | Attending: Physical Medicine & Rehabilitation | Admitting: *Deleted

## 2013-09-29 VITALS — BP 124/78 | HR 81 | Resp 14

## 2013-09-29 DIAGNOSIS — IMO0002 Reserved for concepts with insufficient information to code with codable children: Secondary | ICD-10-CM

## 2013-09-29 DIAGNOSIS — G894 Chronic pain syndrome: Secondary | ICD-10-CM | POA: Insufficient documentation

## 2013-09-29 DIAGNOSIS — E785 Hyperlipidemia, unspecified: Secondary | ICD-10-CM | POA: Insufficient documentation

## 2013-09-29 DIAGNOSIS — E119 Type 2 diabetes mellitus without complications: Secondary | ICD-10-CM | POA: Insufficient documentation

## 2013-09-29 DIAGNOSIS — M542 Cervicalgia: Secondary | ICD-10-CM | POA: Insufficient documentation

## 2013-09-29 DIAGNOSIS — M461 Sacroiliitis, not elsewhere classified: Secondary | ICD-10-CM | POA: Insufficient documentation

## 2013-09-29 DIAGNOSIS — M47817 Spondylosis without myelopathy or radiculopathy, lumbosacral region: Secondary | ICD-10-CM | POA: Insufficient documentation

## 2013-09-29 DIAGNOSIS — IMO0001 Reserved for inherently not codable concepts without codable children: Secondary | ICD-10-CM | POA: Insufficient documentation

## 2013-09-29 DIAGNOSIS — I10 Essential (primary) hypertension: Secondary | ICD-10-CM | POA: Insufficient documentation

## 2013-09-29 NOTE — Progress Notes (Signed)
Here for pill count and medication refills. Oxymorphone er 10 mg # 60  Today NV# 15  Hydrocodone 5/325 # 90  Fill date 09/01/13 Today NV# 5.  No falls and low fall risk. Handout given on home fall prevention tips.  Return to see Dr Riley KillSwartz in one mont.  Refills given.  Pill counts appropriate.

## 2013-09-29 NOTE — Patient Instructions (Signed)
Follow up with Dr Riley KillSwartz 10/27/13

## 2013-10-27 ENCOUNTER — Encounter: Payer: Medicare Other | Attending: Physical Medicine & Rehabilitation | Admitting: Physical Medicine & Rehabilitation

## 2013-10-27 ENCOUNTER — Encounter: Payer: Self-pay | Admitting: Physical Medicine & Rehabilitation

## 2013-10-27 VITALS — BP 132/79 | HR 87 | Resp 14 | Ht 63.0 in | Wt 143.0 lb

## 2013-10-27 DIAGNOSIS — IMO0001 Reserved for inherently not codable concepts without codable children: Secondary | ICD-10-CM | POA: Insufficient documentation

## 2013-10-27 DIAGNOSIS — G9050 Complex regional pain syndrome I, unspecified: Secondary | ICD-10-CM | POA: Insufficient documentation

## 2013-10-27 DIAGNOSIS — IMO0002 Reserved for concepts with insufficient information to code with codable children: Secondary | ICD-10-CM | POA: Insufficient documentation

## 2013-10-27 DIAGNOSIS — M5412 Radiculopathy, cervical region: Secondary | ICD-10-CM | POA: Insufficient documentation

## 2013-10-27 DIAGNOSIS — M461 Sacroiliitis, not elsewhere classified: Secondary | ICD-10-CM

## 2013-10-27 DIAGNOSIS — M47817 Spondylosis without myelopathy or radiculopathy, lumbosacral region: Secondary | ICD-10-CM | POA: Insufficient documentation

## 2013-10-27 DIAGNOSIS — M5010 Cervical disc disorder with radiculopathy, unspecified cervical region: Secondary | ICD-10-CM

## 2013-10-27 DIAGNOSIS — G90519 Complex regional pain syndrome I of unspecified upper limb: Secondary | ICD-10-CM | POA: Insufficient documentation

## 2013-10-27 MED ORDER — HYDROCODONE-ACETAMINOPHEN 5-325 MG PO TABS: 1.0000 | ORAL_TABLET | Freq: Four times a day (QID) | ORAL | Status: DC | PRN

## 2013-10-27 MED ORDER — OXCARBAZEPINE 150 MG PO TABS: 150.0000 mg | ORAL_TABLET | Freq: Two times a day (BID) | ORAL | Status: DC

## 2013-10-27 MED ORDER — ALPRAZOLAM 1 MG PO TABS: 1.0000 mg | ORAL_TABLET | Freq: Every day | ORAL | Status: DC | PRN

## 2013-10-27 MED ORDER — PREDNISONE 20 MG PO TABS: 20.0000 mg | ORAL_TABLET | ORAL | Status: DC

## 2013-10-27 MED ORDER — OXYMORPHONE HCL ER 10 MG PO T12A
10.0000 mg | EXTENDED_RELEASE_TABLET | Freq: Two times a day (BID) | ORAL | Status: DC
Start: 2013-10-27 — End: 2013-11-24

## 2013-10-27 NOTE — Addendum Note (Signed)
Addended by: Sherre PootWALSTON, Jame Morrell N on: 10/27/2013 11:01 AM   Modules accepted: Orders

## 2013-10-27 NOTE — Progress Notes (Signed)
Subjective:    Patient ID: Shannon French, female    DOB: [birth year], 48 y.o.   MRN: [id number]  HPI  Shannon French is back regarding her chronic pain issues. It has been some time since I've seen her.   Since I last saw her she began to develop more problems with her neck with radiation to her left arm. It effects her ability to perform hygiene and other ADL's. She denies weakness or numbness in the left arm---it's just pain at this point. She has had injections in the remote past, ?2006, to her neck which seemed to help. ?Shannon French. I reviewed her cervical XR from September which was essentially normal.  From a standpoint her low back/SIJ's are starting to bother her again. She has had injections, RF's in the past here at our office. The SIJ's are not bothering her nearly as much as the neck and arm is.  She continues on opana and hydrocodone for pain control at this point in addition to mobic and liddoerm, zanaflex   Pain Inventory Average Pain 9 Pain Right Now 9 My pain is constant, sharp, burning, stabbing, tingling and aching  In the last 24 hours, has pain interfered with the following? General activity 9 Relation with others 9 Enjoyment of life 9 What TIME of day is your pain at its worst? all day Sleep (in general) Poor  Pain is worse with: walking, bending, sitting, inactivity, standing and some activites Pain improves with: rest, heat/ice, therapy/exercise, pacing activities and medication Relief from Meds: 3  Mobility walk without assistance walk with assistance use a cane how many minutes can you walk? 20 do you drive?  yes transfers alone Do you have any goals in this area?  yes  Function disabled: date disabled 2003 I need assistance with the following:  dressing, bathing, meal prep, household duties and shopping Do you have any goals in this area?  yes  Neuro/Psych weakness numbness tingling trouble walking spasms  Prior Studies Any changes since last  visit?  no  Physicians involved in your care Any changes since last visit?  no   Family History  Problem Relation Age of Onset  . Cancer Father    History   Social History  . Marital Status: Single    Spouse Name: N/A    Number of Children: N/A  . Years of Education: N/A   Social History Main Topics  . Smoking status: Former Smoker    Quit date: 10/14/2009  . Smokeless tobacco: Never Used  . Alcohol Use: No  . Drug Use: No  . Sexual Activity: None   Other Topics Concern  . None   Social History Narrative  . None   Past Surgical History  Procedure Laterality Date  . Cholecystectomy    . Appendectomy    . Abdominal hysterectomy    . Ovary surgery     Past Medical History  Diagnosis Date  . Hypertension   . Diabetes mellitus   . Degenerative disc disease   . Osteoporosis   . Hyperlipidemia   . IBS (irritable bowel syndrome)   . Fibromyalgia   . Arm pain     left   BP 132/79  Pulse 87  Resp 14  Ht 5\' 3"  (1.6 m)  Wt 143 lb (64.864 kg)  BMI 25.34 kg/m2  SpO2 99%  Opioid Risk Score:   Fall Risk Score: Low Fall Risk (0-5 points) (pt educated and given brochure on fall risk previously)    Review  of Systems  Musculoskeletal: Positive for back pain, gait problem and neck pain.  Neurological: Positive for weakness and numbness.       Tingling, spasms  All other systems reviewed and are negative.       Objective:   Physical Exam  Constitutional: She is oriented to person, place, and time. She appears well-developed and well-nourished.  HENT:  Head: Normocephalic and atraumatic.  Eyes: Conjunctivae and EOM are normal. Pupils are equal, round, and reactive to light.  Neck: Normal range of motion. Neck supple.  Cardiovascular: Normal rate and regular rhythm.  Pulmonary/Chest: Effort normal and breath sounds normal.  Abdominal: Soft.  Musculoskeletal:  Better posture. Pelvis elevated on the right and rotated 5 degrees counter clockwise. Some  flattening of the lordotic curve of the lumbar spine.   PSIS are less tender.   SLR equivocal.  Compression test positive on left for pain down arm. She has pain induced weakness along the proximal left arm more than left. Reflexes remain 2+ on both UE's. There is no muscle spasm in the neck or shoulder girdle. Cervical ROM is fair.   Needs extra time to rise from a seated to standing positioin. Gait is stable  Neurological: She is alert and oriented to person, place, and time.  Reflex Scores:    Patellar reflexes are 1+ on the right side and 2+ on the left side.  Achilles reflexes are 1+ on the right side and 2+ on the left side. 3-4/5 right ADF, APF, KE, HF. 4+/5 on left  Diminished sensation to FT over the right foot and anterior leg. No swelling or pain with palpation Psychiatric: She has a normal mood and affect. Her behavior is normal. Judgment and thought content normal. Cognition and memory are normal.   Assessment & Plan:   ASSESSMENT:  1. Bilateral sacroiliac joint dysfunction, status post radio  frequencies which have been efficacious--fair control at present 2. Chronic pain syndrome consistent with fibromyalgia.  3. ?Right lumbar radiculopathy  3. Type 2 diabetes.  4. CRPS 1? LUE---sympathetic ganglion blocks? In the past   PLAN:  1. Tizanidine for spasms and sleep. meloxicam prn for pain flares  2. Will check MRI of c-spine continued increase in cervical pain with arm symptoms to rule out radic 3. Hydrocodone and opana were refilled. Will check on approval for generic  4. Steroid burst for left arm pain, add trileptal as well 150mg  bid. Encouraged ongoing use of left arm, massage, desensitization etc. 5. Follow up with NP/me in about one month. 30 minutes of face to face patient care time were spent during this visit. All questions were encouraged and answered.

## 2013-10-27 NOTE — Patient Instructions (Signed)
CONTINUE TO WORK ON REGULAR ROM AND EXERCISE AS TOLERATED

## 2013-10-29 ENCOUNTER — Encounter: Payer: Self-pay | Admitting: *Deleted

## 2013-11-01 ENCOUNTER — Other Ambulatory Visit: Payer: Self-pay

## 2013-11-01 NOTE — Progress Notes (Signed)
Urine drug screen results from 10/27/2013 were consistent.

## 2013-11-10 ENCOUNTER — Other Ambulatory Visit: Payer: Self-pay

## 2013-11-10 MED ORDER — DULOXETINE HCL 60 MG PO CPEP
ORAL_CAPSULE | ORAL | Status: DC
Start: 2013-11-10 — End: 2014-03-19

## 2013-11-12 ENCOUNTER — Ambulatory Visit
Admission: RE | Admit: 2013-11-12 | Discharge: 2013-11-12 | Disposition: A | Discharge status: Home / Self Care | Payer: Medicare Other | Source: Ambulatory Visit | Attending: Physical Medicine & Rehabilitation | Admitting: Physical Medicine & Rehabilitation

## 2013-11-12 DIAGNOSIS — M5010 Cervical disc disorder with radiculopathy, unspecified cervical region: Secondary | ICD-10-CM

## 2013-11-12 DIAGNOSIS — IMO0002 Reserved for concepts with insufficient information to code with codable children: Secondary | ICD-10-CM

## 2013-11-14 ENCOUNTER — Telehealth: Payer: Self-pay | Admitting: Physical Medicine & Rehabilitation

## 2013-11-14 DIAGNOSIS — IMO0001 Reserved for inherently not codable concepts without codable children: Secondary | ICD-10-CM

## 2013-11-14 DIAGNOSIS — M5010 Cervical disc disorder with radiculopathy, unspecified cervical region: Secondary | ICD-10-CM

## 2013-11-14 DIAGNOSIS — IMO0002 Reserved for concepts with insufficient information to code with codable children: Secondary | ICD-10-CM

## 2013-11-14 NOTE — Telephone Encounter (Signed)
Please contact pt to let her know that her cervical MRI reveals minimal disease. There is certainly no sign of radiculopathy or anything which would be contributing to pain in her arm. How is she doing otherwise?

## 2013-11-15 NOTE — Telephone Encounter (Signed)
Patient returned call to clinic. Attempted to contact patient. Left a voicemail for patient to return call to clinic.

## 2013-11-15 NOTE — Telephone Encounter (Signed)
Left message for patient to call office to inform her of her MRI results.

## 2013-11-17 ENCOUNTER — Other Ambulatory Visit: Payer: Self-pay

## 2013-11-17 DIAGNOSIS — IMO0002 Reserved for concepts with insufficient information to code with codable children: Secondary | ICD-10-CM

## 2013-11-17 DIAGNOSIS — IMO0001 Reserved for inherently not codable concepts without codable children: Secondary | ICD-10-CM

## 2013-11-17 DIAGNOSIS — M5010 Cervical disc disorder with radiculopathy, unspecified cervical region: Secondary | ICD-10-CM

## 2013-11-17 MED ORDER — PREDNISONE 20 MG PO TABS: 20.0000 mg | ORAL_TABLET | ORAL | Status: DC

## 2013-11-17 NOTE — Telephone Encounter (Signed)
Patient called to get MRI results.

## 2013-11-17 NOTE — Telephone Encounter (Signed)
Contacted patient to inform her that steroid taper was e scribed to pharmacy. Also informed patient that Dr. Riley KillSwartz wants to refer her to Dr. Yolanda BonineBertrand for left stellate ganglion block.  Referral sent to Dr. Cherlyn LabellaBertrand's office per Dr. Riley KillSwartz.  Diane at Dr. Yolanda BonineBertrand office requested patient's office note and demographics faxed to [phone]

## 2013-11-17 NOTE — Telephone Encounter (Signed)
Contacted patient to inform her of the MRI results. Patient stated she is still having the pain in her neck radiating down her arm. She said that while taking the prednisone the pain was a lot better, and now that she is finished with the prednisone the pain is back.  Please advise.

## 2013-11-17 NOTE — Telephone Encounter (Signed)
Repeat steroid taper. I have written for this. Also, I would like  To refer her to Dr. Yolanda BonineBertrand for left stellate ganglion block

## 2013-11-24 ENCOUNTER — Encounter: Payer: Self-pay | Admitting: Registered Nurse

## 2013-11-24 ENCOUNTER — Encounter: Payer: Medicare Other | Attending: Physical Medicine & Rehabilitation | Admitting: Registered Nurse

## 2013-11-24 VITALS — BP 146/89 | HR 82 | Resp 14 | Ht 63.0 in | Wt 140.0 lb

## 2013-11-24 DIAGNOSIS — M461 Sacroiliitis, not elsewhere classified: Secondary | ICD-10-CM | POA: Insufficient documentation

## 2013-11-24 DIAGNOSIS — Z79899 Other long term (current) drug therapy: Secondary | ICD-10-CM

## 2013-11-24 DIAGNOSIS — G9050 Complex regional pain syndrome I, unspecified: Secondary | ICD-10-CM

## 2013-11-24 DIAGNOSIS — M5010 Cervical disc disorder with radiculopathy, unspecified cervical region: Secondary | ICD-10-CM

## 2013-11-24 DIAGNOSIS — M47817 Spondylosis without myelopathy or radiculopathy, lumbosacral region: Secondary | ICD-10-CM | POA: Insufficient documentation

## 2013-11-24 DIAGNOSIS — IMO0001 Reserved for inherently not codable concepts without codable children: Secondary | ICD-10-CM | POA: Insufficient documentation

## 2013-11-24 DIAGNOSIS — IMO0002 Reserved for concepts with insufficient information to code with codable children: Secondary | ICD-10-CM | POA: Insufficient documentation

## 2013-11-24 DIAGNOSIS — M5412 Radiculopathy, cervical region: Secondary | ICD-10-CM | POA: Insufficient documentation

## 2013-11-24 DIAGNOSIS — Z5181 Encounter for therapeutic drug level monitoring: Secondary | ICD-10-CM

## 2013-11-24 MED ORDER — OXYMORPHONE HCL ER 10 MG PO T12A: 10.0000 mg | EXTENDED_RELEASE_TABLET | Freq: Two times a day (BID) | ORAL | Status: DC

## 2013-11-24 MED ORDER — HYDROCODONE-ACETAMINOPHEN 5-325 MG PO TABS: 1.0000 | ORAL_TABLET | Freq: Four times a day (QID) | ORAL | Status: DC | PRN

## 2013-11-24 NOTE — Progress Notes (Signed)
Subjective:    Patient ID: Shannon French, female    DOB: 07/20/1966, 48 y.o.   MRN: [id number]  HPI: Shannon French is a 48 year old female who returns for follow up for chronic pain and medication refill. She says the prednisone has helped her tremendously. She's able to perform her activities of daily living. She's very happy with how she feels this month compared to last month. She's going to see Dr. Valinda HoarBernhard for evaluation of a nerve block on April 28,2015.She says her pain is in her neck, upper back and lower back and left arm. She rates her pain a 7.Her current exercise regime is walking daily, she's trying to increase to 30 minutes a day. She carries her cane when walking, in case she needs it. In the summer she will start her pool exercises. Pain Inventory Average Pain 7 Pain Right Now 7 My pain is constant, sharp, burning, stabbing, tingling and aching  In the last 24 hours, has pain interfered with the following? General activity 7 Relation with others 7 Enjoyment of life 7 What TIME of day is your pain at its worst? constant all day Sleep (in general) Fair  Pain is worse with: walking, bending, sitting, inactivity, standing and some activites Pain improves with: rest, heat/ice, therapy/exercise, pacing activities and medication Relief from Meds: 7  Mobility walk without assistance walk with assistance use a cane how many minutes can you walk? 30 ability to climb steps?  yes do you drive?  yes transfers alone Do you have any goals in this area?  yes  Function disabled: date disabled 2003 I need assistance with the following:  meal prep, household duties and shopping Do you have any goals in this area?  yes  Neuro/Psych weakness numbness tingling trouble walking  Prior Studies Any changes since last visit?  yes  Physicians involved in your care Any changes since last visit?  no   Family History  Problem Relation Age of Onset  . Cancer Father    History     Social History  . Marital Status: Single    Spouse Name: N/A    Number of Children: N/A  . Years of Education: N/A   Social History Main Topics  . Smoking status: Former Smoker    Quit date: 10/14/2009  . Smokeless tobacco: Never Used  . Alcohol Use: No  . Drug Use: No  . Sexual Activity: None   Other Topics Concern  . None   Social History Narrative  . None   Past Surgical History  Procedure Laterality Date  . Cholecystectomy    . Appendectomy    . Abdominal hysterectomy    . Ovary surgery     Past Medical History  Diagnosis Date  . Hypertension   . Diabetes mellitus   . Degenerative disc disease   . Osteoporosis   . Hyperlipidemia   . IBS (irritable bowel syndrome)   . Fibromyalgia   . Arm pain     left   BP 146/89  Pulse 82  Resp 14  Ht 5\' 3"  (1.6 m)  Wt 140 lb (63.504 kg)  BMI 24.81 kg/m2  SpO2 99%  Opioid Risk Score:   Fall Risk Score: Low Fall Risk (0-5 points) (pt educated and given brochure on fall risk previously)    Review of Systems  Cardiovascular: Positive for leg swelling.  Endocrine:       High blood sugar  Musculoskeletal: Positive for back pain, gait problem and neck  pain.  Neurological: Positive for weakness and numbness.       Tingling  All other systems reviewed and are negative.      Objective:   Physical Exam  Nursing note and vitals reviewed. Constitutional: She is oriented to person, place, and time. She appears well-developed and well-nourished.  HENT:  Head: Normocephalic.  Neck: Normal range of motion. Neck supple.  Cervical paraspinal Tenderness Noted : C3-C6  Cardiovascular: Normal rate and regular rhythm.   Pulmonary/Chest: Effort normal and breath sounds normal.  Musculoskeletal:  Normal Muscle Bulk: Muscle testing Reveals: Right Hand Grip 5/5. Left hand 4/5. Trapezius Muscles/ Spine of Scapula/ Thoracic Paraspinal Tenderness T1-T4 Lumbar Paraspinals Tenderness L4-L5 Lower Extremities: Flexion with  (dscomfort noted to Lower Back Spinal Flexion: 40 degrees Able to arise from chair without difficulty Narrow gait  Neurological: She is alert and oriented to person, place, and time.  Skin: Skin is warm and dry.  Psychiatric: She has a normal mood and affect.          Assessment & Plan:  1. Bilateral sacroiliac joint dysfunction: Refilled: HYDROcodone 5/325mg  one tablet every 6 hrs as needed #90 and OPANA 10 mg one tablet every 12 hours #60 2. Chronic pain syndrome consistent with fibromyalgia. Continue with exercise regime, heat therapy,voltarengel and trileptal. Continue current analgesics 3. Right lumbar radiculopathy: Continue with Medication Regime and Current Treatment  Modalities 4. Complex Regional Pain Syndrome: Has and appointment for evaluation with Dr. Valinda HoarBernhard on 11/30/13  5. Type 2 diabetes.: PMD Following   Follow up in 1 month  30 minutes of face to face patient care time was spent during this visit. All questions were encouraged and answered.

## 2013-12-22 ENCOUNTER — Encounter: Payer: Self-pay | Admitting: Registered Nurse

## 2013-12-22 ENCOUNTER — Encounter: Payer: Medicare Other | Attending: Physical Medicine & Rehabilitation | Admitting: Registered Nurse

## 2013-12-22 VITALS — BP 106/77 | HR 84 | Resp 14 | Wt 140.8 lb

## 2013-12-22 DIAGNOSIS — IMO0002 Reserved for concepts with insufficient information to code with codable children: Secondary | ICD-10-CM

## 2013-12-22 DIAGNOSIS — IMO0001 Reserved for inherently not codable concepts without codable children: Secondary | ICD-10-CM

## 2013-12-22 DIAGNOSIS — M461 Sacroiliitis, not elsewhere classified: Secondary | ICD-10-CM

## 2013-12-22 DIAGNOSIS — M47817 Spondylosis without myelopathy or radiculopathy, lumbosacral region: Secondary | ICD-10-CM

## 2013-12-22 DIAGNOSIS — M5412 Radiculopathy, cervical region: Secondary | ICD-10-CM

## 2013-12-22 DIAGNOSIS — G9050 Complex regional pain syndrome I, unspecified: Secondary | ICD-10-CM

## 2013-12-22 DIAGNOSIS — Z5181 Encounter for therapeutic drug level monitoring: Secondary | ICD-10-CM

## 2013-12-22 DIAGNOSIS — M5010 Cervical disc disorder with radiculopathy, unspecified cervical region: Secondary | ICD-10-CM

## 2013-12-22 DIAGNOSIS — Z79899 Other long term (current) drug therapy: Secondary | ICD-10-CM

## 2013-12-22 MED ORDER — HYDROCODONE-ACETAMINOPHEN 5-325 MG PO TABS: 1.0000 | ORAL_TABLET | Freq: Four times a day (QID) | ORAL | Status: DC | PRN

## 2013-12-22 MED ORDER — OXYMORPHONE HCL ER 10 MG PO T12A: 10.0000 mg | EXTENDED_RELEASE_TABLET | Freq: Two times a day (BID) | ORAL | Status: DC

## 2013-12-22 NOTE — Progress Notes (Signed)
Subjective:    Patient ID: Shannon EaringSally Vales, female    DOB: 01/05/1966, 48 y.o.   MRN: [id number]  HPI: [name] is a 48 year old female who returns for follow up for chronic pain and medication refill. She says her pain is located in her neck, left arm middle and lower back. She rates her pain 5. Her current exercise regime is walking performing stretching and pulley exercises. Her pool will be open in a few weeks and she will start aquatic exercises. She received two injections from Dr. Yolanda BonineBertrand one on May 1th and the other May 15th she is scheduled for the third injection on June 2nd, 2015. He injected left side of her neck.  She will be getting married on June 23 rd,2015.    Pain Inventory Average Pain 6 Pain Right Now 6 My pain is sharp, burning, stabbing, tingling and aching  In the last 24 hours, has pain interfered with the following? General activity 5 Relation with others 5 Enjoyment of life 5 What TIME of day is your pain at its worst? all Sleep (in general) Fair  Pain is worse with: walking, bending, sitting, inactivity, standing and some activites Pain improves with: rest, heat/ice, therapy/exercise, pacing activities, medication and injections Relief from Meds: 6  Mobility walk without assistance how many minutes can you walk? 30 ability to climb steps?  yes do you drive?  yes  Function disabled: date disabled 2003 I need assistance with the following:  meal prep, household duties and shopping  Neuro/Psych weakness numbness tingling trouble walking  Prior Studies Any changes since last visit?  no  Physicians involved in your care Any changes since last visit?  no   Family History  Problem Relation Age of Onset  . Cancer Father    History   Social History  . Marital Status: Single    Spouse Name: N/A    Number of Children: N/A  . Years of Education: N/A   Social History Main Topics  . Smoking status: Former Smoker    Quit date:  10/14/2009  . Smokeless tobacco: Never Used  . Alcohol Use: No  . Drug Use: No  . Sexual Activity: None   Other Topics Concern  . None   Social History Narrative  . None   Past Surgical History  Procedure Laterality Date  . Cholecystectomy    . Appendectomy    . Abdominal hysterectomy    . Ovary surgery     Past Medical History  Diagnosis Date  . Hypertension   . Diabetes mellitus   . Degenerative disc disease   . Osteoporosis   . Hyperlipidemia   . IBS (irritable bowel syndrome)   . Fibromyalgia   . Arm pain     left   BP 106/77  Pulse 84  Resp 14  Wt 140 lb 12.8 oz (63.866 kg)  SpO2 100%  Opioid Risk Score:   Fall Risk Score:  (educated and handout on fall prevention in the home was given at previous visit)  Review of Systems  Musculoskeletal: Positive for gait problem.  Neurological: Positive for weakness and numbness.       Tingling  All other systems reviewed and are negative.      Objective:   Physical Exam  Nursing note and vitals reviewed. Constitutional: She is oriented to person, place, and time. She appears well-developed and well-nourished.  HENT:  Head: Normocephalic and atraumatic.  Neck: Normal range of motion. Neck supple.  Cervical  Paraspinal Tenderness Noted: C-3- C-5  Cardiovascular: Normal rate, regular rhythm and normal heart sounds.   Pulmonary/Chest: Effort normal and breath sounds normal.  Musculoskeletal:  Normal Muscle Bulk: Muscle Testing Reveals: Upper and Lower Extremities: Full ROM and Muscle Strength 5/5. AC-Joint on Left Tenderness with palpation. Thoracic Paraspinal Tenderness: T-3- T-5. Lumbar Paraspinal Tenderness L- 4- L-5 Arises from chair with ease Narrow Based Gait  Neurological: She is alert and oriented to person, place, and time.  Skin: Skin is warm and dry.  Psychiatric: She has a normal mood and affect.          Assessment & Plan:  1. Bilateral sacroiliac joint dysfunction:  Refilled: HYDROcodone  5/325mg  one tablet every 6 hrs as needed #90 and OPANA 10 mg one tablet every 12 hours #60  2. Chronic pain syndrome consistent with fibromyalgia. Continue with exercise regime, heat therapy,voltarengel, lidocaine patches and trileptal. Continue current analgesics  3. Right lumbar radiculopathy: Continue with Medication Regime and Current Treatment Modalities and Exercise Regime. 4. Complex Regional Pain Syndrome: Continue Current Medication and Exercise Regime. Dr. Yolanda BonineBertrand Following 5. Type 2 diabetes.: PMD Following   20 minutes of face to face patient care time was spent during this visit. All questions were encouraged and answered.  F/U in 1 month

## 2014-01-02 ENCOUNTER — Other Ambulatory Visit: Payer: Self-pay | Admitting: Physical Medicine & Rehabilitation

## 2014-01-21 ENCOUNTER — Encounter: Payer: Medicare Other | Attending: Physical Medicine & Rehabilitation | Admitting: Physical Medicine & Rehabilitation

## 2014-01-21 ENCOUNTER — Encounter: Payer: Self-pay | Admitting: Physical Medicine & Rehabilitation

## 2014-01-21 VITALS — BP 110/78 | HR 73 | Resp 14 | Ht 63.0 in | Wt 139.8 lb

## 2014-01-21 DIAGNOSIS — IMO0002 Reserved for concepts with insufficient information to code with codable children: Secondary | ICD-10-CM

## 2014-01-21 DIAGNOSIS — IMO0001 Reserved for inherently not codable concepts without codable children: Secondary | ICD-10-CM

## 2014-01-21 DIAGNOSIS — M5010 Cervical disc disorder with radiculopathy, unspecified cervical region: Secondary | ICD-10-CM

## 2014-01-21 DIAGNOSIS — M47817 Spondylosis without myelopathy or radiculopathy, lumbosacral region: Secondary | ICD-10-CM

## 2014-01-21 DIAGNOSIS — M5412 Radiculopathy, cervical region: Secondary | ICD-10-CM

## 2014-01-21 DIAGNOSIS — M461 Sacroiliitis, not elsewhere classified: Secondary | ICD-10-CM

## 2014-01-21 DIAGNOSIS — G9050 Complex regional pain syndrome I, unspecified: Secondary | ICD-10-CM

## 2014-01-21 MED ORDER — HYDROCODONE-ACETAMINOPHEN 5-325 MG PO TABS
1.0000 | ORAL_TABLET | Freq: Four times a day (QID) | ORAL | Status: DC | PRN
Start: 2014-01-21 — End: 2014-02-23

## 2014-01-21 MED ORDER — OXYMORPHONE HCL ER 10 MG PO T12A: 10.0000 mg | EXTENDED_RELEASE_TABLET | Freq: Two times a day (BID) | ORAL | Status: DC

## 2014-01-21 NOTE — Progress Notes (Signed)
Subjective:    Patient ID: Shannon French, female    DOB: 05/06/1966, 48 y.o.   MRN: [id number]  HPI  Shannon French is back regarding her chronic pain. Her cervical MRI was unremarkable.  We sent her to Dr. Yolanda French for Sympathetic blocks. She has received three thus far which have begun to help. They are discussing a series of 6 in total. The oral steroids helped quite a bit but her sugars were out of control.   Her low back and pelvis is bothering her more. She is having difficulty walking. She is using a cane for support. She feels that it's in her SI's where she had pain before. Dr. Wynn French preformed SI blocks and ultimately RF's. The RF's were done 03/2011 and 07/2011 I believe.  Pain Inventory Average Pain 6 Pain Right Now 7 My pain is constant, sharp, burning, stabbing, tingling and aching  In the last 24 hours, has pain interfered with the following? General activity 7 Relation with others 7 Enjoyment of life 7 What TIME of day is your pain at its worst? all Sleep (in general) Fair  Pain is worse with: walking, bending, sitting, inactivity, standing and some activites Pain improves with: rest, heat/ice, therapy/exercise, pacing activities, medication and injections Relief from Meds: 6  Mobility walk without assistance walk with assistance use a cane how many minutes can you walk? 20 ability to climb steps?  yes do you drive?  yes  Function disabled: date disabled 2003 I need assistance with the following:  meal prep, household duties and shopping  Neuro/Psych weakness numbness tingling trouble walking spasms  Prior Studies Any changes since last visit?  no  Physicians involved in your care Any changes since last visit?  no   Family History  Problem Relation Age of Onset  . Cancer Father    History   Social History  . Marital Status: Single    Spouse Name: N/A    Number of Children: N/A  . Years of Education: N/A   Social History Main Topics  . Smoking  status: Former Smoker    Quit date: 10/14/2009  . Smokeless tobacco: Never Used  . Alcohol Use: No  . Drug Use: No  . Sexual Activity: None   Other Topics Concern  . None   Social History Narrative  . None   Past Surgical History  Procedure Laterality Date  . Cholecystectomy    . Appendectomy    . Abdominal hysterectomy    . Ovary surgery     Past Medical History  Diagnosis Date  . Hypertension   . Diabetes mellitus   . Degenerative disc disease   . Osteoporosis   . Hyperlipidemia   . IBS (irritable bowel syndrome)   . Fibromyalgia   . Arm pain     left   BP 110/78  Pulse 73  Resp 14  Ht 5\' 3"  (1.6 m)  Wt 139 lb 12.8 oz (63.413 kg)  BMI 24.77 kg/m2  SpO2 99%  Opioid Risk Score:   Fall Risk Score: Moderate Fall Risk (6-13 points) (previously educated and given handout for fall prevention in the home) Review of Systems  Musculoskeletal: Positive for gait problem.       Spasms  Neurological: Positive for weakness and numbness.       Tingling  All other systems reviewed and are negative.      Objective:   Physical Exam Constitutional: She is oriented to person, place, and time. She appears well-developed and well-nourished.  HENT:  Head: Normocephalic and atraumatic.  Eyes: Conjunctivae and EOM are normal. Pupils are equal, round, and reactive to light.  Neck: Normal range of motion. Neck supple.  Cardiovascular: Normal rate and regular rhythm.  Pulmonary/Chest: Effort normal and breath sounds normal.  Abdominal: Soft.  Musculoskeletal:  PSIS are both very sensitive.  Compression test and FABER test positive for SIJ's. Has limitations in lumbar ROM in all planes. She walks slowly with WB gait.   SLR equivocal.   Reflexes remain 2+ on both UE's. There is no muscle spasm in the neck or shoulder girdle. Cervical ROM is fair. LUE is much less sensitive to touch. Uses left arm more spontaneously.   Needs extra time to rise from a seated to standing  positioin. Gait is stable  Neurological: She is alert and oriented to person, place, and time.  Reflex Scores:  Patellar reflexes are 1+ on the right side and 2+ on the left side.  Achilles reflexes are 1+ on the right side and 2+ on the left side. 3-4/5 right ADF, APF, KE, HF. 4+/5 on left  Diminished sensation to FT over the right foot and anterior leg. No swelling or pain with palpation Psychiatric: She has a normal mood and affect. Her behavior is normal. Judgment and thought content normal. Cognition and memory are normal.   Assessment & Plan:   ASSESSMENT:  1. Bilateral sacroiliac joint dysfunction, status post radio  frequencies which have been efficacious. Pain has increased substantially again. 2. Chronic pain syndrome consistent with fibromyalgia.  3. ?Right lumbar radiculopathy  3. Type 2 diabetes.  4. CRPS 1? LUE---sympathetic ganglion blocks? In the past   PLAN:  1. Tizanidine for spasms and sleep. meloxicam prn for pain flares  2. Continue with series of sympathetic ganglion blocks per Dr. Yolanda French. I would like to pursue desensitization therapy once complete 3. Hydrocodone and opana were refilled. Will check on approval for generic  4. Will refer pt to Dr. Wynn French for repeat RF's of bilateral SIJ's as she had greater than 2 years of relief with the last set.   5. Follow up with me pending injections above. 30 minutes of face to face patient care time were spent during this visit. All questions were encouraged and answered.

## 2014-01-21 NOTE — Patient Instructions (Signed)
PLEASE CALL ME WITH ANY PROBLEMS OR QUESTIONS (#[phone]).      

## 2014-02-19 ENCOUNTER — Other Ambulatory Visit: Payer: Self-pay | Admitting: Physical Medicine & Rehabilitation

## 2014-02-23 ENCOUNTER — Encounter: Payer: Self-pay | Admitting: Registered Nurse

## 2014-02-23 ENCOUNTER — Telehealth: Payer: Self-pay | Admitting: *Deleted

## 2014-02-23 ENCOUNTER — Encounter: Payer: Medicare Other | Attending: Physical Medicine & Rehabilitation | Admitting: Registered Nurse

## 2014-02-23 VITALS — BP 139/88 | HR 94 | Resp 14 | Ht 63.0 in | Wt 138.0 lb

## 2014-02-23 DIAGNOSIS — M461 Sacroiliitis, not elsewhere classified: Secondary | ICD-10-CM | POA: Diagnosis present

## 2014-02-23 DIAGNOSIS — Z5181 Encounter for therapeutic drug level monitoring: Secondary | ICD-10-CM

## 2014-02-23 DIAGNOSIS — G9050 Complex regional pain syndrome I, unspecified: Secondary | ICD-10-CM | POA: Diagnosis present

## 2014-02-23 DIAGNOSIS — IMO0001 Reserved for inherently not codable concepts without codable children: Secondary | ICD-10-CM | POA: Insufficient documentation

## 2014-02-23 DIAGNOSIS — M47817 Spondylosis without myelopathy or radiculopathy, lumbosacral region: Secondary | ICD-10-CM | POA: Diagnosis present

## 2014-02-23 DIAGNOSIS — Z79899 Other long term (current) drug therapy: Secondary | ICD-10-CM

## 2014-02-23 DIAGNOSIS — M5010 Cervical disc disorder with radiculopathy, unspecified cervical region: Secondary | ICD-10-CM

## 2014-02-23 DIAGNOSIS — M5412 Radiculopathy, cervical region: Secondary | ICD-10-CM | POA: Insufficient documentation

## 2014-02-23 DIAGNOSIS — IMO0002 Reserved for concepts with insufficient information to code with codable children: Secondary | ICD-10-CM | POA: Diagnosis present

## 2014-02-23 MED ORDER — HYDROCODONE-ACETAMINOPHEN 5-325 MG PO TABS: 1.0000 | ORAL_TABLET | Freq: Four times a day (QID) | ORAL | Status: DC | PRN

## 2014-02-23 MED ORDER — OXYMORPHONE HCL ER 10 MG PO T12A: 10.0000 mg | EXTENDED_RELEASE_TABLET | Freq: Two times a day (BID) | ORAL | Status: DC

## 2014-02-23 NOTE — Progress Notes (Signed)
Subjective:    Patient ID: Shannon French, female    DOB: 06-Jun-1966, 48 y.o.   MRN: [id number]  HPI: [name] is a 48 year old female who returns for follow up for chronic pain and medication refill. She says her pain is located in her left shoulder and mid back. She rates her pain 8. Her current exercise regime is walking. She is scheduled for a Radiofrequency Neurotomy with Dr. Wynn Banker on August 20th,2015. She say's the last time she was given Valium 10 mg and she consumed three tablets. She had one the night prior to procedure the morning of the procedure and one hour prior to procedure. Not documented.Message was sent to Dr. Wynn Banker he will give valium 10 mg one hour prior to procedure. She was called and verbalized understanding. Also instructed to call one hour prior to procedure so Valium can be called in.She verbalized understanding. She is very emotional this visit, her brother made himself a DNR. He has been diagnosed with brain aneurysm she stated. Also says she understands it's just hard. Emotional support given. She hasn't seen Dr. Yolanda Bonine for the Sympathetic blocks due to scheduling. She will be calling today for appointment.  Pain Inventory Average Pain 8 Pain Right Now 8 My pain is n/a  In the last 24 hours, has pain interfered with the following? General activity 7 Relation with others 7 Enjoyment of life 7 What TIME of day is your pain at its worst? constant Sleep (in general) Poor  Pain is worse with: walking, bending, sitting, inactivity, standing and some activites Pain improves with: medication Relief from Meds: 5  Mobility walk without assistance walk with assistance use a cane how many minutes can you walk? 20 ability to climb steps?  yes do you drive?  yes transfers alone Do you have any goals in this area?  yes  Function disabled: date disabled 2003 I need assistance with the following:  meal prep, household duties and  shopping  Neuro/Psych weakness numbness tingling trouble walking spasms  Prior Studies Any changes since last visit?  no  Physicians involved in your care Any changes since last visit?  no   Family History  Problem Relation Age of Onset  . Cancer Father    History   Social History  . Marital Status: Single    Spouse Name: N/A    Number of Children: N/A  . Years of Education: N/A   Social History Main Topics  . Smoking status: Former Smoker    Quit date: 10/14/2009  . Smokeless tobacco: Never Used  . Alcohol Use: No  . Drug Use: No  . Sexual Activity: None   Other Topics Concern  . None   Social History Narrative  . None   Past Surgical History  Procedure Laterality Date  . Cholecystectomy    . Appendectomy    . Abdominal hysterectomy    . Ovary surgery     Past Medical History  Diagnosis Date  . Hypertension   . Diabetes mellitus   . Degenerative disc disease   . Osteoporosis   . Hyperlipidemia   . IBS (irritable bowel syndrome)   . Fibromyalgia   . Arm pain     left   BP 139/88  Pulse 94  Resp 14  Ht 5\' 3"  (1.6 m)  Wt 138 lb (62.596 kg)  BMI 24.45 kg/m2  SpO2 100%  Opioid Risk Score:   Fall Risk Score: Low Fall Risk (0-5 points) (patient educated handout  declined)   Review of Systems  Musculoskeletal: Positive for arthralgias, back pain, gait problem, myalgias and neck pain.  Neurological: Positive for weakness and numbness.       Tingling, spasm  All other systems reviewed and are negative.      Objective:   Physical Exam  Nursing note and vitals reviewed. Constitutional: She is oriented to person, place, and time. She appears well-developed and well-nourished.  HENT:  Head: Normocephalic and atraumatic.  Neck: Normal range of motion. Neck supple.  Musculoskeletal:  Normal Muscle Bulk and Muscle Testing Reveals: Upper Extremities: Full ROM and Muscle Strength on the Right 5/5 and Left 4/5. Thoracic Paraspinal Tenderness:  T-7- T-11 Lower Extremities: Full ROM and Muscle Strength 5/5 Bilateral Flexion Produces Pain into Lower Back Arises from chair with ease Narrow based Gait  Neurological: She is alert and oriented to person, place, and time.  Skin: Skin is warm and dry.          Assessment & Plan:  1. Bilateral sacroiliac joint dysfunction:  Refilled: HYDROcodone 5/325mg  one tablet every 6 hrs as needed #90 and OPANA 10 mg one tablet every 12 hours #60. Scheduled for Radiofrequency Neurotomy on 03/24/14 with Dr. Wynn BankerKirsteins. 2. Chronic pain syndrome consistent with fibromyalgia. Continue with exercise regime, heat therapy,voltarengel, lidocaine patches and trileptal. Continue current analgesics  3. Right lumbar radiculopathy: Continue with Medication Regime and Current Treatment Modalities and Exercise Regime.  4. Complex Regional Pain Syndrome: Continue Current Medication and Exercise Regime. Dr. Yolanda BonineBertrand Following  5. Type 2 diabetes.: PMD Following   20 minutes of face to face patient care time was spent during this visit. All questions were encouraged and answered.   F/U in 1 month

## 2014-02-23 NOTE — Telephone Encounter (Signed)
Patient is scheduled for Radio Frequency Neurotomy on August 20,2015. She was underthe impression she would receive Valium 10 mg x3. One the night before and one the day of the procedure and an hour prior to procedure. Message was sent to Dr. Wynn BankerKirsteins. He will be performing the radio Frequency Neurotomy on the right side. She will be given Valium 10 mg one hour prior to procedure. She verbalizes understanding. She has been instructed to call office a week before procedure and valium will be called in. She verbalizes understanding.

## 2014-02-23 NOTE — Telephone Encounter (Signed)
Shannon CowerJason, this patient was scheduled for Bilateral SI RF's on 8/20 with Dr Wynn BankerKirsteins.  I have gone in and edited the visit info to read right SI RF.  I am not sure if you have to get approval, but it will not be both sides.

## 2014-02-25 ENCOUNTER — Telehealth: Payer: Self-pay | Admitting: *Deleted

## 2014-02-25 MED ORDER — DIAZEPAM 10 MG PO TABS: ORAL_TABLET | ORAL | Status: DC

## 2014-02-25 NOTE — Telephone Encounter (Signed)
Per Dr Wynn BankerKirsteins  May do Right side SI RF, 10mg  valium prior to procedure

## 2014-02-25 NOTE — Telephone Encounter (Signed)
Pre med called to pharmacy. Ms Mariam DollarKearns was expecting this to be done.

## 2014-03-19 ENCOUNTER — Other Ambulatory Visit: Payer: Self-pay | Admitting: Physical Medicine & Rehabilitation

## 2014-03-24 ENCOUNTER — Encounter: Payer: Medicare Other | Attending: Physical Medicine & Rehabilitation

## 2014-03-24 ENCOUNTER — Ambulatory Visit: Payer: Medicare Other | Admitting: Physical Medicine & Rehabilitation

## 2014-03-24 ENCOUNTER — Other Ambulatory Visit: Payer: Self-pay

## 2014-03-24 DIAGNOSIS — M461 Sacroiliitis, not elsewhere classified: Secondary | ICD-10-CM

## 2014-03-24 DIAGNOSIS — IMO0001 Reserved for inherently not codable concepts without codable children: Secondary | ICD-10-CM | POA: Insufficient documentation

## 2014-03-24 DIAGNOSIS — M47817 Spondylosis without myelopathy or radiculopathy, lumbosacral region: Secondary | ICD-10-CM | POA: Insufficient documentation

## 2014-03-24 DIAGNOSIS — IMO0002 Reserved for concepts with insufficient information to code with codable children: Secondary | ICD-10-CM

## 2014-03-24 DIAGNOSIS — M5412 Radiculopathy, cervical region: Secondary | ICD-10-CM | POA: Insufficient documentation

## 2014-03-24 DIAGNOSIS — G9050 Complex regional pain syndrome I, unspecified: Secondary | ICD-10-CM | POA: Insufficient documentation

## 2014-03-24 MED ORDER — HYDROCODONE-ACETAMINOPHEN 5-325 MG PO TABS: 1.0000 | ORAL_TABLET | Freq: Four times a day (QID) | ORAL | Status: DC | PRN

## 2014-03-24 NOTE — Telephone Encounter (Signed)
Patient's appt was reschedule on 8/20 by provider. RX printed for PoloniaEunice to sign.

## 2014-03-31 ENCOUNTER — Other Ambulatory Visit: Payer: Self-pay

## 2014-03-31 DIAGNOSIS — IMO0001 Reserved for inherently not codable concepts without codable children: Secondary | ICD-10-CM

## 2014-03-31 DIAGNOSIS — M47817 Spondylosis without myelopathy or radiculopathy, lumbosacral region: Secondary | ICD-10-CM

## 2014-03-31 DIAGNOSIS — IMO0002 Reserved for concepts with insufficient information to code with codable children: Secondary | ICD-10-CM

## 2014-03-31 DIAGNOSIS — M461 Sacroiliitis, not elsewhere classified: Secondary | ICD-10-CM

## 2014-03-31 DIAGNOSIS — M5010 Cervical disc disorder with radiculopathy, unspecified cervical region: Secondary | ICD-10-CM

## 2014-03-31 MED ORDER — OXYMORPHONE HCL ER 10 MG PO T12A: 10.0000 mg | EXTENDED_RELEASE_TABLET | Freq: Two times a day (BID) | ORAL | Status: DC

## 2014-03-31 NOTE — Telephone Encounter (Signed)
Opana RX printed for patient to pickup.

## 2014-04-21 ENCOUNTER — Ambulatory Visit (HOSPITAL_BASED_OUTPATIENT_CLINIC_OR_DEPARTMENT_OTHER): Payer: Medicare Other | Admitting: Physical Medicine & Rehabilitation

## 2014-04-21 ENCOUNTER — Encounter: Payer: Self-pay | Admitting: Physical Medicine & Rehabilitation

## 2014-04-21 ENCOUNTER — Encounter: Payer: Medicare Other | Attending: Physical Medicine & Rehabilitation

## 2014-04-21 VITALS — BP 122/79 | HR 83 | Resp 14 | Ht 64.0 in | Wt 144.0 lb

## 2014-04-21 DIAGNOSIS — M461 Sacroiliitis, not elsewhere classified: Secondary | ICD-10-CM

## 2014-04-21 DIAGNOSIS — M5412 Radiculopathy, cervical region: Secondary | ICD-10-CM

## 2014-04-21 DIAGNOSIS — IMO0001 Reserved for inherently not codable concepts without codable children: Secondary | ICD-10-CM | POA: Insufficient documentation

## 2014-04-21 DIAGNOSIS — M47817 Spondylosis without myelopathy or radiculopathy, lumbosacral region: Secondary | ICD-10-CM | POA: Diagnosis present

## 2014-04-21 DIAGNOSIS — IMO0002 Reserved for concepts with insufficient information to code with codable children: Secondary | ICD-10-CM | POA: Diagnosis present

## 2014-04-21 DIAGNOSIS — M5010 Cervical disc disorder with radiculopathy, unspecified cervical region: Secondary | ICD-10-CM

## 2014-04-21 DIAGNOSIS — G9050 Complex regional pain syndrome I, unspecified: Secondary | ICD-10-CM | POA: Insufficient documentation

## 2014-04-21 DIAGNOSIS — M5416 Radiculopathy, lumbar region: Secondary | ICD-10-CM

## 2014-04-21 MED ORDER — OXYMORPHONE HCL ER 10 MG PO T12A: 10.0000 mg | EXTENDED_RELEASE_TABLET | Freq: Two times a day (BID) | ORAL | Status: DC

## 2014-04-21 MED ORDER — HYDROCODONE-ACETAMINOPHEN 5-325 MG PO TABS: 1.0000 | ORAL_TABLET | Freq: Four times a day (QID) | ORAL | Status: DC | PRN

## 2014-04-21 NOTE — Patient Instructions (Addendum)
Sacroiliac injection was performed today. A combination of a naming medicine plus a cortisone medicine was injected. The injection was done under x-ray guidance. This procedure has been performed to help reduce low back and buttocks pain as well as potentially hip pain. The duration of this injection is variable lasting from hours to  Months. It may repeated if needed.  If this procedure reduces pain by 50% short term would rec repeat RF

## 2014-04-21 NOTE — Progress Notes (Signed)
Bilateral sacroiliac injection under fluoroscopic guidance  Indication: Right Low back and buttocks pain not relieved by medication management and other conservative care.  Informed consent was obtained after describing risks and benefits of the procedure with the patient, this includes bleeding, bruising, infection, paralysis and medication side effects. The patient wishes to proceed and has given written consent. The patient was placed in a prone position. The lumbar and sacral area was marked and prepped with Betadine. A 25-gauge 1-1/2 inch needle was inserted into the skin and subcutaneous tissue and 1 mL of 1% lidocaine was injected. Then a 25-gauge 3 inch spinal needle was inserted under fluoroscopic guidance into the Right sacroiliac joint. AP and lateral images were utilized. Omnipaque 180x0.5 mL under live fluoroscopy demonstrated no intravascular uptake. Then a solution containing one ML of 6 mgper ml betamethasone and 2 ML of 1% lidocaine MPF was injected x1.5 mL.same procedure on left  Patient tolerated the procedure well. Post procedure instructions were given. Please see post procedure form.  Preinjection pain 9/10 Post injection pain 4/10

## 2014-04-21 NOTE — Progress Notes (Signed)
  PROCEDURE RECORD [name]   Name: Shannon French DOB:02-Feb-1966 MRN: [id number]  Date:04/21/2014  Physician: Claudette Laws, MD    Nurse/CMA: Kelli Churn RN/ Theodoro Doing CMA  Allergies:  Allergies  Allergen Reactions  . Gabapentin [Gabapentin] Swelling    Suspects allergy was to dye; capsule was yellow  . Actos [Pioglitazone Hydrochloride] Other (See Comments) and Hypertension    HEADACHES  . Esomeprazole Magnesium Hives  . Glucophage [Metformin Hydrochloride] Hives and Swelling  . Mepivacaine Hcl Hives and Itching    carbocaine  . Other Other (See Comments)    XRAY DYE - THROAT SWELLS  . Sulfa Antibiotics Hives    Consent Signed: Yes.    Is patient diabetic? Yes.    CBG today? 111  Pregnant: No. LMP: No LMP recorded. Patient has had a hysterectomy. (age 81-55)  Anticoagulants: no Anti-inflammatory: no Antibiotics: no  Procedure: right Sacroiliac Steroid Injection Position: Prone Start Time:11:50  End Time: 11:54 Fluoro Time: 17 seconds  RN/CMA Gladyes Kudo CMA Ginkle CMA    Time 1116 11:58    BP 122/79 116/66    Pulse 83 80    Respirations 14 14    O2 Sat 95 95    S/S 6 6    Pain Level 9/10 5/10     D/C home with daughter, patient A & O X 3, D/C instructions reviewed, and sits independently.

## 2014-05-12 ENCOUNTER — Telehealth: Payer: Self-pay | Admitting: Physical Medicine & Rehabilitation

## 2014-05-12 NOTE — Telephone Encounter (Signed)
Pt came walked in face to face to request to have an increase in MG ALPRAZolam (XANAX) 1 MG tablet .Marland Kitchen. She states she is having both hips done and she will need the extra help.. [phone] home  Cell [phone]

## 2014-05-16 ENCOUNTER — Telehealth: Payer: Self-pay | Admitting: *Deleted

## 2014-05-16 NOTE — Telephone Encounter (Signed)
Patient was in the office on Thursday 05/12/14 and spoke with Dena requesting her Xanax be prescribed 1 mg BID  Because she is having both hips done and "needs alittle extra help".  Please advise

## 2014-05-16 NOTE — Telephone Encounter (Signed)
Patient stated that she wanted it for the pre -procedure because she almost Jumped off the table last time she had the procedure done.

## 2014-05-16 NOTE — Telephone Encounter (Signed)
Besides for MRI when have we ordered xanax?

## 2014-05-16 NOTE — Telephone Encounter (Signed)
May take Xanax 1 mg prior to procedure: #1 tablet no refill

## 2014-05-19 NOTE — Telephone Encounter (Signed)
Called RX into pharmacy

## 2014-05-26 ENCOUNTER — Encounter: Payer: Medicare Other | Attending: Physical Medicine & Rehabilitation

## 2014-05-26 ENCOUNTER — Encounter: Payer: Self-pay | Admitting: Physical Medicine & Rehabilitation

## 2014-05-26 ENCOUNTER — Ambulatory Visit (HOSPITAL_BASED_OUTPATIENT_CLINIC_OR_DEPARTMENT_OTHER): Payer: Medicare Other | Admitting: Physical Medicine & Rehabilitation

## 2014-05-26 ENCOUNTER — Other Ambulatory Visit: Payer: Self-pay | Admitting: Physical Medicine & Rehabilitation

## 2014-05-26 VITALS — BP 117/82 | HR 84 | Resp 14 | Wt 142.0 lb

## 2014-05-26 DIAGNOSIS — IMO0001 Reserved for inherently not codable concepts without codable children: Secondary | ICD-10-CM

## 2014-05-26 DIAGNOSIS — G894 Chronic pain syndrome: Secondary | ICD-10-CM | POA: Insufficient documentation

## 2014-05-26 DIAGNOSIS — M47817 Spondylosis without myelopathy or radiculopathy, lumbosacral region: Secondary | ICD-10-CM

## 2014-05-26 DIAGNOSIS — M5010 Cervical disc disorder with radiculopathy, unspecified cervical region: Secondary | ICD-10-CM | POA: Insufficient documentation

## 2014-05-26 DIAGNOSIS — M5416 Radiculopathy, lumbar region: Secondary | ICD-10-CM | POA: Diagnosis not present

## 2014-05-26 DIAGNOSIS — Z5181 Encounter for therapeutic drug level monitoring: Secondary | ICD-10-CM | POA: Insufficient documentation

## 2014-05-26 DIAGNOSIS — M791 Myalgia: Secondary | ICD-10-CM

## 2014-05-26 DIAGNOSIS — M609 Myositis, unspecified: Secondary | ICD-10-CM | POA: Insufficient documentation

## 2014-05-26 DIAGNOSIS — G9050 Complex regional pain syndrome I, unspecified: Secondary | ICD-10-CM | POA: Insufficient documentation

## 2014-05-26 DIAGNOSIS — Z79899 Other long term (current) drug therapy: Secondary | ICD-10-CM

## 2014-05-26 DIAGNOSIS — M461 Sacroiliitis, not elsewhere classified: Secondary | ICD-10-CM | POA: Insufficient documentation

## 2014-05-26 DIAGNOSIS — IMO0002 Reserved for concepts with insufficient information to code with codable children: Secondary | ICD-10-CM

## 2014-05-26 MED ORDER — OXYMORPHONE HCL ER 10 MG PO T12A: 10.0000 mg | EXTENDED_RELEASE_TABLET | Freq: Two times a day (BID) | ORAL | Status: DC

## 2014-05-26 MED ORDER — HYDROCODONE-ACETAMINOPHEN 5-325 MG PO TABS: 1.0000 | ORAL_TABLET | Freq: Four times a day (QID) | ORAL | Status: DC | PRN

## 2014-05-26 NOTE — Progress Notes (Signed)
Bilateral sacroiliac injection under fluoroscopic guidance  Indication: Right Low back and buttocks pain not relieved by medication management and other conservative care.  Informed consent was obtained after describing risks and benefits of the procedure with the patient, this includes bleeding, bruising, infection, paralysis and medication side effects. The patient wishes to proceed and has given written consent. The patient was placed in a prone position. The lumbar and sacral area was marked and prepped with Betadine. A 25-gauge 1-1/2 inch needle was inserted into the skin and subcutaneous tissue and 1 mL of 1% lidocaine was injected. Then a 25-gauge 3 inch spinal needle was inserted under fluoroscopic guidance into the Right sacroiliac joint. AP and lateral images were utilized. Omnipaque 180x0.5 mL under live fluoroscopy demonstrated no intravascular uptake. Then a solution containing one ML of 6 mgper ml betamethasone and 2 ML of 1% lidocaine MPF was injected x1.5 mL.same procedure on left  Patient tolerated the procedure well. Post procedure instructions were given. Please see post procedure form.  Preinjection pain 8/10 Post injection pain 5/10

## 2014-05-26 NOTE — Progress Notes (Signed)
  PROCEDURE RECORD [facility]   Name: Shannon EaringSally Luecke DOB:05/22/1966 MRN: [id number]  Date:05/26/2014  Physician: Claudette LawsAndrew Kirsteins, MD    Nurse/CMA: Shumaker RN  Allergies:  Allergies  Allergen Reactions  . Gabapentin [Gabapentin] Swelling    Suspects allergy was to dye; capsule was yellow  . Actos [Pioglitazone Hydrochloride] Other (See Comments) and Hypertension    HEADACHES  . Esomeprazole Magnesium Hives  . Glucophage [Metformin Hydrochloride] Hives and Swelling  . Mepivacaine Hcl Hives and Itching    carbocaine  . Other Other (See Comments)    XRAY DYE - THROAT SWELLS  . Sulfa Antibiotics Hives    Consent Signed: Yes.    Is patient diabetic? Yes.    CBG today? 97  Pregnant: No. LMP: No LMP recorded. Patient has had a hysterectomy. (age 50-55)  Anticoagulants: no Anti-inflammatory: no Antibiotics: no  Procedure: Bilateral sacroiliac steroid injection Position: Prone Start Time: 11:52 End Time: 11:58 Fluoro Time: 14 seconds  RN/CMA Designer, multimediahumaker RN Shumaker RN    Time 11:30 12:00    BP 117/82 113/64    Pulse 84 72    Respirations 14 14    O2 Sat 97 97    S/S 6 6    Pain Level 8/10 5/10     D/C home with husband, patient A & O X 3, D/C instructions reviewed, and sits independently.

## 2014-05-26 NOTE — Patient Instructions (Addendum)
Sacroiliac injection was performed today. A combination of a naming medicine plus a cortisone medicine was injected. This may raise blood sugars for diabetics for 2-3 days The injection was done under x-ray guidance. This procedure has been performed to help reduce low back and buttocks pain as well as potentially hip pain. The duration of this injection is variable lasting from hours to  Months. It may repeated if needed.

## 2014-05-27 LAB — PMP ALCOHOL METABOLITE (ETG): Ethyl Glucuronide (EtG): NEGATIVE ng/mL

## 2014-05-30 LAB — MEPERIDINE (GC/LC/MS), URINE
MEPERIDINE UR CONFIRM: NEGATIVE ng/mL (ref <100)
Normeperidine (GC/LC/MS), ur confirm: NEGATIVE ng/mL (ref <100)

## 2014-05-30 LAB — OPIATES/OPIOIDS (LC/MS-MS)
Codeine Urine: NEGATIVE ng/mL (ref <50)
Hydrocodone: 1446 ng/mL (ref <50)
Hydromorphone: NEGATIVE ng/mL — AB (ref <50)
Morphine Urine: NEGATIVE ng/mL (ref <50)
Norhydrocodone, Ur: 3363 ng/mL (ref <50)
Noroxycodone, Ur: NEGATIVE ng/mL (ref <50)
OXYCODONE, UR: NEGATIVE ng/mL (ref <50)
OXYMORPHONE, URINE: 8207 ng/mL (ref <50)

## 2014-05-30 LAB — OXYCODONE, URINE (LC/MS-MS)
NOROXYCODONE, UR: NEGATIVE ng/mL (ref <50)
OXYCODONE, UR: NEGATIVE ng/mL (ref <50)
Oxymorphone: 8207 ng/mL (ref <50)

## 2014-05-30 LAB — CANNABANOIDS (GC/LC/MS), URINE: THC-COOH (GC/LC/MS), ur confirm: 108 ng/mL — AB (ref <5)

## 2014-05-31 LAB — PRESCRIPTION MONITORING PROFILE (SOLSTAS)
Amphetamine/Meth: NEGATIVE ng/mL
Barbiturate Screen, Urine: NEGATIVE ng/mL
Benzodiazepine Screen, Urine: NEGATIVE ng/mL
Buprenorphine, Urine: NEGATIVE ng/mL
COCAINE METABOLITES: NEGATIVE ng/mL
Carisoprodol, Urine: NEGATIVE ng/mL
Creatinine, Urine: 111.19 mg/dL (ref >20.0)
ECSTASY: NEGATIVE ng/mL
Fentanyl, Ur: NEGATIVE ng/mL
METHADONE SCREEN, URINE: NEGATIVE ng/mL
NITRITES URINE, INITIAL: NEGATIVE ug/mL
PROPOXYPHENE: NEGATIVE ng/mL
Tapentadol, urine: NEGATIVE ng/mL
Tramadol Scrn, Ur: NEGATIVE ng/mL
ZOLPIDEM, URINE: NEGATIVE ng/mL
pH, Initial: 6.1 pH (ref 4.5–8.9)

## 2014-06-01 ENCOUNTER — Other Ambulatory Visit: Payer: Self-pay | Admitting: Physical Medicine & Rehabilitation

## 2014-06-10 ENCOUNTER — Other Ambulatory Visit: Payer: Self-pay | Admitting: Registered Nurse

## 2014-06-15 ENCOUNTER — Telehealth: Payer: Self-pay | Admitting: Physical Medicine & Rehabilitation

## 2014-06-15 NOTE — Telephone Encounter (Signed)
Have we had any inconsistencies with her before?.  I don't recall

## 2014-06-16 NOTE — Telephone Encounter (Signed)
No we have not.  She is newly married and I dont know if that change in her status has brought the behavior to her character, or whether she has had this behavior before.  I will ask but her next appt is with Kirsteins and for injection and the UDS was done at last visit for injection.  He will not be willing to write for the refill and so if you are planning on giving her another chance you will probably have to do the refill. I will discuss it with her once I know what the plan will be.

## 2014-06-17 NOTE — Telephone Encounter (Signed)
She is a long term patient. I will give her a second chance, but she's got to understand that if she fails a test again, there will be no more narcs.

## 2014-06-21 ENCOUNTER — Ambulatory Visit: Payer: Medicare Other | Admitting: Physical Medicine & Rehabilitation

## 2014-06-27 ENCOUNTER — Encounter: Payer: Medicare Other | Attending: Physical Medicine & Rehabilitation | Admitting: Physical Medicine & Rehabilitation

## 2014-06-27 ENCOUNTER — Telehealth: Payer: Self-pay | Admitting: *Deleted

## 2014-06-27 DIAGNOSIS — M47817 Spondylosis without myelopathy or radiculopathy, lumbosacral region: Secondary | ICD-10-CM

## 2014-06-27 DIAGNOSIS — M5416 Radiculopathy, lumbar region: Secondary | ICD-10-CM

## 2014-06-27 DIAGNOSIS — Z79899 Other long term (current) drug therapy: Secondary | ICD-10-CM | POA: Insufficient documentation

## 2014-06-27 DIAGNOSIS — M461 Sacroiliitis, not elsewhere classified: Secondary | ICD-10-CM

## 2014-06-27 DIAGNOSIS — M5010 Cervical disc disorder with radiculopathy, unspecified cervical region: Secondary | ICD-10-CM

## 2014-06-27 DIAGNOSIS — Z5181 Encounter for therapeutic drug level monitoring: Secondary | ICD-10-CM | POA: Insufficient documentation

## 2014-06-27 DIAGNOSIS — M609 Myositis, unspecified: Secondary | ICD-10-CM | POA: Insufficient documentation

## 2014-06-27 DIAGNOSIS — G9050 Complex regional pain syndrome I, unspecified: Secondary | ICD-10-CM | POA: Insufficient documentation

## 2014-06-27 DIAGNOSIS — G894 Chronic pain syndrome: Secondary | ICD-10-CM | POA: Insufficient documentation

## 2014-06-27 DIAGNOSIS — IMO0002 Reserved for concepts with insufficient information to code with codable children: Secondary | ICD-10-CM

## 2014-06-27 DIAGNOSIS — M791 Myalgia: Secondary | ICD-10-CM | POA: Insufficient documentation

## 2014-06-27 DIAGNOSIS — IMO0001 Reserved for inherently not codable concepts without codable children: Secondary | ICD-10-CM

## 2014-06-27 MED ORDER — OXYMORPHONE HCL ER 10 MG PO T12A: 10.0000 mg | EXTENDED_RELEASE_TABLET | Freq: Two times a day (BID) | ORAL | Status: DC

## 2014-06-27 MED ORDER — HYDROCODONE-ACETAMINOPHEN 5-325 MG PO TABS: 1.0000 | ORAL_TABLET | Freq: Four times a day (QID) | ORAL | Status: DC | PRN

## 2014-06-27 NOTE — Telephone Encounter (Signed)
Rx printed for up coming RN med refill on Wednesday.  Dr Riley KillSwartz will sign.  I will discuss with her the last UDS being + THC and given warning that if shows up again no narcotics will be prescribed andy longer.

## 2014-06-29 ENCOUNTER — Encounter: Payer: Medicare Other | Admitting: *Deleted

## 2014-07-06 ENCOUNTER — Encounter: Payer: Self-pay | Admitting: Registered Nurse

## 2014-07-06 ENCOUNTER — Encounter: Payer: Medicare Other | Attending: Physical Medicine & Rehabilitation | Admitting: Registered Nurse

## 2014-07-06 VITALS — BP 129/74 | HR 73 | Resp 14 | Wt 140.6 lb

## 2014-07-06 DIAGNOSIS — M461 Sacroiliitis, not elsewhere classified: Secondary | ICD-10-CM | POA: Diagnosis not present

## 2014-07-06 DIAGNOSIS — M5010 Cervical disc disorder with radiculopathy, unspecified cervical region: Secondary | ICD-10-CM | POA: Diagnosis not present

## 2014-07-06 DIAGNOSIS — IMO0001 Reserved for inherently not codable concepts without codable children: Secondary | ICD-10-CM

## 2014-07-06 DIAGNOSIS — G894 Chronic pain syndrome: Secondary | ICD-10-CM | POA: Diagnosis present

## 2014-07-06 DIAGNOSIS — Z5181 Encounter for therapeutic drug level monitoring: Secondary | ICD-10-CM | POA: Insufficient documentation

## 2014-07-06 DIAGNOSIS — M791 Myalgia: Secondary | ICD-10-CM

## 2014-07-06 DIAGNOSIS — M47817 Spondylosis without myelopathy or radiculopathy, lumbosacral region: Secondary | ICD-10-CM | POA: Insufficient documentation

## 2014-07-06 DIAGNOSIS — M5416 Radiculopathy, lumbar region: Secondary | ICD-10-CM | POA: Insufficient documentation

## 2014-07-06 DIAGNOSIS — G9050 Complex regional pain syndrome I, unspecified: Secondary | ICD-10-CM | POA: Insufficient documentation

## 2014-07-06 DIAGNOSIS — M609 Myositis, unspecified: Secondary | ICD-10-CM | POA: Insufficient documentation

## 2014-07-06 DIAGNOSIS — IMO0002 Reserved for concepts with insufficient information to code with codable children: Secondary | ICD-10-CM

## 2014-07-06 DIAGNOSIS — Z79899 Other long term (current) drug therapy: Secondary | ICD-10-CM | POA: Insufficient documentation

## 2014-07-06 MED ORDER — ALPRAZOLAM 1 MG PO TABS: 1.0000 mg | ORAL_TABLET | Freq: Every day | ORAL | Status: DC | PRN

## 2014-07-06 MED ORDER — HYDROCODONE-ACETAMINOPHEN 5-325 MG PO TABS: 1.0000 | ORAL_TABLET | Freq: Four times a day (QID) | ORAL | Status: DC | PRN

## 2014-07-06 MED ORDER — OXYMORPHONE HCL ER 10 MG PO T12A: 10.0000 mg | EXTENDED_RELEASE_TABLET | Freq: Two times a day (BID) | ORAL | Status: DC

## 2014-07-06 NOTE — Progress Notes (Signed)
Subjective:    Patient ID: Shannon French, female    DOB: 11/14/1965, 48 y.o.   MRN: [id number]  HPI: Shannon French is a 48 year old female who returns for follow up for chronic pain and medication refill. She says her pain is located in her left shoulder, mid back and bilateral hips. She rates her pain 8. Her current exercise regime is walking and performing stretching exercises. She is scheduled for Right Sacroiliac Radio Frequency on 07/21/14. I spoke with [name] regarding her UDS + THC, she denies smoking marijuana. Educated on [employer] Policy and she verbalizes understanding. If this occurs again she will not be prescribed narcotics and she verbalizes understanding.  She had a script printed last week she missed her appointment script has been discarded.  Pain Inventory Average Pain 7 Pain Right Now 8 My pain is constant, sharp, burning, stabbing, tingling and aching  In the last 24 hours, has pain interfered with the following? General activity 7 Relation with others 7 Enjoyment of life 7 What TIME of day is your pain at its worst? all Sleep (in general) Poor  Pain is worse with: walking, bending, sitting, inactivity, standing and some activites Pain improves with: rest, heat/ice, therapy/exercise, pacing activities, medication and injections Relief from Meds: 5  Mobility walk without assistance walk with assistance use a cane how many minutes can you walk? 20 ability to climb steps?  yes do you drive?  yes  Function disabled: date disabled 2003 I need assistance with the following:  meal prep, household duties and shopping  Neuro/Psych numbness tingling trouble walking spasms anxiety  Prior Studies Any changes since last visit?  no  Physicians involved in your care Any changes since last visit?  no   Family History  Problem Relation Age of Onset  . Cancer Father    History   Social History  . Marital Status: Single    Spouse Name: N/A    Number of  Children: N/A  . Years of Education: N/A   Social History Main Topics  . Smoking status: Former Smoker    Quit date: 10/14/2009  . Smokeless tobacco: Never Used  . Alcohol Use: No  . Drug Use: No  . Sexual Activity: None   Other Topics Concern  . None   Social History Narrative   Past Surgical History  Procedure Laterality Date  . Cholecystectomy    . Appendectomy    . Abdominal hysterectomy    . Ovary surgery     Past Medical History  Diagnosis Date  . Hypertension   . Diabetes mellitus   . Degenerative disc disease   . Osteoporosis   . Hyperlipidemia   . IBS (irritable bowel syndrome)   . Fibromyalgia   . Arm pain     left   BP 129/74 mmHg  Pulse 73  Resp 14  Wt 140 lb 9.6 oz (63.776 kg)  SpO2 96%  Opioid Risk Score:   Fall Risk Score: Moderate Fall Risk (6-13 points) (previously educated and given handout)  Review of Systems  Musculoskeletal: Positive for gait problem.       Spasms  Neurological: Positive for numbness.       Tingling  Psychiatric/Behavioral: The patient is nervous/anxious.   All other systems reviewed and are negative.      Objective:   Physical Exam  Constitutional: She is oriented to person, place, and time. She appears well-developed and well-nourished.  HENT:  Head: Normocephalic and atraumatic.  Neck:  Normal range of motion. Neck supple.  Cervical Paraspinal Tenderness: C-3 - C-5  Cardiovascular: Normal rate and regular rhythm.   Pulmonary/Chest: Effort normal and breath sounds normal.  Musculoskeletal:  Normal Muscle Bulk and Muscle testing Reveals: Upper Extremities: Full ROM and Muscle Strength 5/5 Thoracic and Lumbar Hypersensitivity Lower Extremities: Full ROM and Muscle Strength 5/5 Bilateral Lower Extremities Flexion Produces Pain into Hips Arises from chair with ease Narrow Based Gait  Neurological: She is alert and oriented to person, place, and time.  Skin: Skin is warm and dry.  Psychiatric: She has a normal  mood and affect.  Nursing note and vitals reviewed.         Assessment & Plan:  1. Bilateral sacroiliac joint dysfunction:  Refilled: HYDROcodone 5/325mg  one tablet every 6 hrs as needed #90 and OPANA 10 mg one tablet every 12 hours #60. Scheduled for Right Radiofrequency Neurotomy on 07/21/14 with Dr. Wynn BankerKirsteins. 2. Chronic pain syndrome consistent with fibromyalgia. Continue with exercise regime, heat therapy,voltarengel, lidocaine patches and trileptal. Continue current analgesics  3. Right lumbar radiculopathy: Continue with Medication Regime and Current Treatment Modalities and Exercise Regime.  4. Complex Regional Pain Syndrome: Continue Current Medication and Exercise Regime. Dr. Yolanda BonineBertrand Following  5. Type 2 diabetes.: PMD Following   20 minutes of face to face patient care time was spent during this visit. All questions were encouraged and answered.   F/U in 1 month

## 2014-07-21 ENCOUNTER — Ambulatory Visit (HOSPITAL_BASED_OUTPATIENT_CLINIC_OR_DEPARTMENT_OTHER): Payer: Medicare Other | Admitting: Physical Medicine & Rehabilitation

## 2014-07-21 ENCOUNTER — Other Ambulatory Visit: Payer: Self-pay | Admitting: Physical Medicine & Rehabilitation

## 2014-07-21 ENCOUNTER — Encounter: Payer: Self-pay | Admitting: Physical Medicine & Rehabilitation

## 2014-07-21 VITALS — BP 109/71 | HR 69 | Resp 14

## 2014-07-21 DIAGNOSIS — G894 Chronic pain syndrome: Secondary | ICD-10-CM | POA: Diagnosis not present

## 2014-07-21 DIAGNOSIS — M533 Sacrococcygeal disorders, not elsewhere classified: Secondary | ICD-10-CM

## 2014-07-21 DIAGNOSIS — Z5181 Encounter for therapeutic drug level monitoring: Secondary | ICD-10-CM

## 2014-07-21 DIAGNOSIS — Z79899 Other long term (current) drug therapy: Secondary | ICD-10-CM

## 2014-07-21 NOTE — Progress Notes (Signed)
R L4 medial branch, R L5 dorsal ramus radiofrequency ablation Right S1,S2,S3, neurolysis under fluoro guidance  Informed consent was obtained after discussing risks and benefits of the procedure with the patient area and this includes bleeding, bruising, infection. Patient elects to proceed and has given written consent. Patient placed in a prone position low back and buttocks area on the right side marked and prepped with Betadine. Then a 25-gauge 1.5 inch needle was used to anesthetize the skin and subcutaneous tissue with 1% lidocaine, 2 cc into each of 5 areas. Then a 10 cm RF needle with a 1 cm curved active tip was inserted first targeting the junction of the sacroiliac and S1 SAP AP and lateral images confirm proper needle location followed by sensory and motor stimulation at 50 Hz and 2 Hz respectively confirming proper needle location then a solution containing 1 cc of 4 mg/cc dexamethasone and 4 cc of 1% MPF lidocaine was injected times 1 cc. RF lesioning 80C 90 seconds. Then the right L5 SAP transverse process junction was targeted needle placed at junction AP and lateral images confirm proper normal location. Sensory stimulation at 50 Hz followed by motor stimulation at 2 Hz confirm proper needle location followed by injection of 1 cc of the dexamethasone and lidocaine solution and radiofrequency lesioning 80C 90 seconds. Then the lateral aspect of the S1-S2 and S3 foramen or targeted on the right side needle was placed just lateral to the foramen. Sensory stimulation showed elicited paresthesias. Followed by injection of 1 cc of the dexamethasone lidocaine solution and radiofrequency lesioning 80C 90 seconds. Patient tolerated procedure well Post procedure instructions given  Plan to do left side in 1 month

## 2014-07-21 NOTE — Patient Instructions (Addendum)
You had a radio frequency procedure today This was done to alleviate joint pain in your lumbar area We injected a combination of dexamethasone which is a steroid as well as lidocaine which is a local anesthetic. Dexamethasone made increased blood sugars you are diabetic You may experience soreness at the injection sites. You may also experienced some irritation of the nerves that were heated I'm recommending ice for 30 minutes every 2 hours as needed for the next 24-48 hours   

## 2014-07-21 NOTE — Progress Notes (Signed)
  PROCEDURE RECORD [facility]   Name: Shannon French DOB:03/11/1966 MRN: [id number]  Date:07/21/2014  Physician: Claudette LawsAndrew Kirsteins, MD    Nurse/CMA:Ken Jesse Hirst/Shumaker RN  Allergies:  Allergies  Allergen Reactions  . Gabapentin [Gabapentin] Swelling    Suspects allergy was to dye; capsule was yellow  . Actos [Pioglitazone Hydrochloride] Other (See Comments) and Hypertension    HEADACHES  . Esomeprazole Magnesium Hives  . Glucophage [Metformin Hydrochloride] Hives and Swelling  . Mepivacaine Hcl Hives and Itching    carbocaine  . Other Other (See Comments)    XRAY DYE - THROAT SWELLS  . Sulfa Antibiotics Hives    Consent Signed: Yes.    Is patient diabetic? Yes.    CBG today? 69  Pregnant: No. LMP: No LMP recorded. Patient has had a hysterectomy. (age 50-55)  Anticoagulants: no Anti-inflammatory: no Antibiotics: no  Procedure: Right Radiofrequency neurotomy Position: Prone Start Time: 1:41  pm   End Time:  Fluoro Time: 45 seconds  RN/CMA Purvis SheffieldKen Azzam Mehra     Time 1:20 PM 2:05 Pm    BP 109/71 110/72    Pulse 69 65    Respirations 14 14    O2 Sat 99 97    S/S 6 6    Pain Level 9/10 7/10     D/C home with daughter patient A & O X 3, D/C instructions reviewed, and sits independently.

## 2014-07-22 LAB — PMP ALCOHOL METABOLITE (ETG): Ethyl Glucuronide (EtG): NEGATIVE ng/mL

## 2014-07-25 LAB — OPIATES/OPIOIDS (LC/MS-MS)
Codeine Urine: NEGATIVE ng/mL (ref <50)
HYDROMORPHONE: NEGATIVE ng/mL — AB (ref <50)
Hydrocodone: 1272 ng/mL (ref <50)
Morphine Urine: NEGATIVE ng/mL (ref <50)
Norhydrocodone, Ur: 1588 ng/mL (ref <50)
Noroxycodone, Ur: NEGATIVE ng/mL (ref <50)
OXYMORPHONE, URINE: 13886 ng/mL (ref <50)
Oxycodone, ur: NEGATIVE ng/mL (ref <50)

## 2014-07-25 LAB — OXYCODONE, URINE (LC/MS-MS)
Noroxycodone, Ur: NEGATIVE ng/mL (ref <50)
OXYCODONE, UR: NEGATIVE ng/mL (ref <50)
OXYMORPHONE, URINE: 13886 ng/mL (ref <50)

## 2014-07-25 LAB — MEPERIDINE (GC/LC/MS), URINE
Meperidine (GC/LC/MS), ur confirm: NEGATIVE ng/mL (ref <100)
Normeperidine (GC/LC/MS), ur confirm: NEGATIVE ng/mL (ref <100)

## 2014-07-26 LAB — PRESCRIPTION MONITORING PROFILE (SOLSTAS)
Amphetamine/Meth: NEGATIVE ng/mL
Barbiturate Screen, Urine: NEGATIVE ng/mL
Benzodiazepine Screen, Urine: NEGATIVE ng/mL
Buprenorphine, Urine: NEGATIVE ng/mL
CANNABINOID SCRN UR: NEGATIVE ng/mL
Carisoprodol, Urine: NEGATIVE ng/mL
Cocaine Metabolites: NEGATIVE ng/mL
Creatinine, Urine: 81.91 mg/dL (ref >20.0)
Fentanyl, Ur: NEGATIVE ng/mL
MDMA URINE: NEGATIVE ng/mL
Methadone Screen, Urine: NEGATIVE ng/mL
Nitrites, Initial: NEGATIVE ug/mL
Propoxyphene: NEGATIVE ng/mL
Tapentadol, urine: NEGATIVE ng/mL
Tramadol Scrn, Ur: NEGATIVE ng/mL
Zolpidem, Urine: NEGATIVE ng/mL
pH, Initial: 6.3 pH (ref 4.5–8.9)

## 2014-08-08 ENCOUNTER — Encounter: Payer: Medicare Other | Attending: Physical Medicine & Rehabilitation | Admitting: Registered Nurse

## 2014-08-08 ENCOUNTER — Encounter: Payer: Self-pay | Admitting: Registered Nurse

## 2014-08-08 VITALS — BP 114/74 | HR 69 | Resp 14

## 2014-08-08 DIAGNOSIS — Z79899 Other long term (current) drug therapy: Secondary | ICD-10-CM | POA: Insufficient documentation

## 2014-08-08 DIAGNOSIS — M47817 Spondylosis without myelopathy or radiculopathy, lumbosacral region: Secondary | ICD-10-CM | POA: Diagnosis not present

## 2014-08-08 DIAGNOSIS — M609 Myositis, unspecified: Secondary | ICD-10-CM | POA: Diagnosis not present

## 2014-08-08 DIAGNOSIS — G894 Chronic pain syndrome: Secondary | ICD-10-CM

## 2014-08-08 DIAGNOSIS — M5416 Radiculopathy, lumbar region: Secondary | ICD-10-CM | POA: Diagnosis not present

## 2014-08-08 DIAGNOSIS — M461 Sacroiliitis, not elsewhere classified: Secondary | ICD-10-CM | POA: Insufficient documentation

## 2014-08-08 DIAGNOSIS — Z5181 Encounter for therapeutic drug level monitoring: Secondary | ICD-10-CM | POA: Diagnosis not present

## 2014-08-08 DIAGNOSIS — M5010 Cervical disc disorder with radiculopathy, unspecified cervical region: Secondary | ICD-10-CM | POA: Insufficient documentation

## 2014-08-08 DIAGNOSIS — G9050 Complex regional pain syndrome I, unspecified: Secondary | ICD-10-CM | POA: Insufficient documentation

## 2014-08-08 DIAGNOSIS — M791 Myalgia: Secondary | ICD-10-CM | POA: Diagnosis not present

## 2014-08-08 DIAGNOSIS — M533 Sacrococcygeal disorders, not elsewhere classified: Secondary | ICD-10-CM

## 2014-08-08 DIAGNOSIS — IMO0001 Reserved for inherently not codable concepts without codable children: Secondary | ICD-10-CM

## 2014-08-08 MED ORDER — ALPRAZOLAM 1 MG PO TABS: 1.0000 mg | ORAL_TABLET | Freq: Every day | ORAL | Status: DC | PRN

## 2014-08-08 MED ORDER — HYDROCODONE-ACETAMINOPHEN 5-325 MG PO TABS: 1.0000 | ORAL_TABLET | Freq: Four times a day (QID) | ORAL | Status: DC | PRN

## 2014-08-08 MED ORDER — OXYMORPHONE HCL ER 10 MG PO T12A: 10.0000 mg | EXTENDED_RELEASE_TABLET | Freq: Two times a day (BID) | ORAL | Status: DC

## 2014-08-08 NOTE — Progress Notes (Signed)
Subjective:    Patient ID: Shannon French, female    DOB: 1965/09/11, 49 y.o.   MRN: [id number]  HPI: Shannon French is a 49 year old female who returns for follow up for chronic pain and medication refill. She says her pain is located in her right shoulder, mid back and left hip. She rates her pain 7. Her current exercise regime is walking and performing stretching exercises. She's s/p Right Sacroiliac Radio Frequency on 07/21/14 with good relief noted. Scheduled for Left Sacroiliac Radio Frequency on 08/23/2014. After Opana was counted MA was placing top on bottle and 3 Opana fell on the floor. Medication was destroyed, offered to allow Shannon French to re-fill early, she denies.   Pain Inventory Average Pain 6 Pain Right Now 7 My pain is constant, sharp, burning, dull, stabbing, tingling and aching  In the last 24 hours, has pain interfered with the following? General activity 8 Relation with others 8 Enjoyment of life 7 What TIME of day is your pain at its worst? all Sleep (in general) Fair  Pain is worse with: walking, bending, sitting, inactivity, standing and some activites Pain improves with: rest, heat/ice, therapy/exercise, pacing activities, medication and injections Relief from Meds: 5  Mobility walk without assistance walk with assistance use a cane how many minutes can you walk? 15 min ability to climb steps?  yes do you drive?  yes Do you have any goals in this area?  yes  Function disabled: date disabled 2003 I need assistance with the following:  meal prep, household duties and shopping Do you have any goals in this area?  yes  Neuro/Psych numbness tingling trouble walking spasms dizziness anxiety  Prior Studies Any changes since last visit?  no  Physicians involved in your care Any changes since last visit?  no   Family History  Problem Relation Age of Onset  . Cancer Father    History   Social History  . Marital Status: Single    Spouse  Name: N/A    Number of Children: N/A  . Years of Education: N/A   Social History Main Topics  . Smoking status: Former Smoker    Quit date: 10/14/2009  . Smokeless tobacco: Never Used  . Alcohol Use: No  . Drug Use: No  . Sexual Activity: None   Other Topics Concern  . None   Social History Narrative   Past Surgical History  Procedure Laterality Date  . Cholecystectomy    . Appendectomy    . Abdominal hysterectomy    . Ovary surgery     Past Medical History  Diagnosis Date  . Hypertension   . Diabetes mellitus   . Degenerative disc disease   . Osteoporosis   . Hyperlipidemia   . IBS (irritable bowel syndrome)   . Fibromyalgia   . Arm pain     left   BP 114/74 mmHg  Pulse 69  Resp 14  SpO2 99%  Opioid Risk Score:   Fall Risk Score: Low Fall Risk (0-5 points) Review of Systems  Musculoskeletal: Positive for gait problem.  Neurological: Positive for numbness.       Spasms tingling  Psychiatric/Behavioral: The patient is nervous/anxious.   All other systems reviewed and are negative.      Objective:   Physical Exam  Constitutional: She is oriented to person, place, and time. She appears well-developed and well-nourished.  HENT:  Head: Normocephalic and atraumatic.  Neck:  Cervical Paraspinal Tenderness: C-4- C-5  Cardiovascular:  Normal rate and regular rhythm.   Pulmonary/Chest: Effort normal and breath sounds normal.  Musculoskeletal:  Normal Muscle Bulk and Muscle Testing Reveals: Upper extremities: Full ROM and Muscle Strength 5/5 Thoracic Paraspinal Tenderness: T-6- T-8 Lumbar Paraspinal Tenderness: L-3- L-5 Lower extremities: Full ROM and Muscle strength 5/5 Left Leg Flexion Produces pain into Left Hip (ilium) Arises from chair with ease Narrow Based gait  Neurological: She is alert and oriented to person, place, and time.  Skin: Skin is warm and dry.  Psychiatric: She has a normal mood and affect.  Nursing note and vitals  reviewed.         Assessment & Plan:  1. Bilateral sacroiliac joint dysfunction:  Refilled: HYDROcodone 5/325mg  one tablet every 6 hrs as needed #90 and OPANA 10 mg one tablet every 12 hours #60. Scheduled for Left Radiofrequency Neurotomy on 08/23/14 with Dr. Wynn Banker. 2. Chronic pain syndrome consistent with fibromyalgia. Continue with exercise regime, heat therapy,voltarengel, lidocaine patches and trileptal. Continue current analgesics  3. Right lumbar radiculopathy: No Complaints Today.Continue with Medication Regime and Current Treatment Modalities and Exercise Regime.  4. Complex Regional Pain Syndrome: Continue Current Medication and Exercise Regime. Dr. Yolanda Bonine Following  5. Type 2 diabetes.: PMD Following   20 minutes of face to face patient care time was spent during this visit. All questions were encouraged and answered.   F/U in 1 month

## 2014-08-10 NOTE — Progress Notes (Addendum)
Urine drug screen for this encounter was consistent for prescribed medication. Xanax taken same day as test just prior to appt.

## 2014-08-18 ENCOUNTER — Encounter: Payer: Self-pay | Admitting: Physical Medicine & Rehabilitation

## 2014-08-18 ENCOUNTER — Ambulatory Visit (HOSPITAL_BASED_OUTPATIENT_CLINIC_OR_DEPARTMENT_OTHER): Payer: Medicare Other | Admitting: Physical Medicine & Rehabilitation

## 2014-08-18 VITALS — BP 124/74 | HR 66 | Resp 14

## 2014-08-18 DIAGNOSIS — M461 Sacroiliitis, not elsewhere classified: Secondary | ICD-10-CM

## 2014-08-18 DIAGNOSIS — G894 Chronic pain syndrome: Secondary | ICD-10-CM | POA: Diagnosis not present

## 2014-08-18 DIAGNOSIS — M47817 Spondylosis without myelopathy or radiculopathy, lumbosacral region: Secondary | ICD-10-CM

## 2014-08-18 NOTE — Progress Notes (Signed)
  PROCEDURE RECORD [facility]   Name: Allena EaringSally Gelles DOB:11/01/1965 MRN: [id number]  Date:08/18/2014  Physician: Claudette LawsAndrew Kirsteins, MD    Nurse/CMA: MaryBeth Ginkel Allergies:  Allergies  Allergen Reactions  . Gabapentin [Gabapentin] Swelling    Suspects allergy was to dye; capsule was yellow  . Actos [Pioglitazone Hydrochloride] Other (See Comments) and Hypertension    HEADACHES  . Esomeprazole Magnesium Hives  . Glucophage [Metformin Hydrochloride] Hives and Swelling  . Mepivacaine Hcl Hives and Itching    carbocaine  . Other Other (See Comments)    XRAY DYE - THROAT SWELLS  . Sulfa Antibiotics Hives    Consent Signed: Yes.    Is patient diabetic? Yes.    CBG today? 105  Pregnant: No. LMP: No LMP recorded. Patient has had a hysterectomy. (age 51-55)  Anticoagulants: no Anti-inflammatory: yes (last dose: 6:00 am) Antibiotics: no  Procedure: RF SI joint  Position: Prone Start Time: 1:22 PM  End Time:1:51 PM            Fluoro Time: 56   RN/CMA *Ken Treasure Ochs MaryBeth Ginkel    Time 1:00 PM 1:59pm    BP 124/74 130/60    Pulse 70 68    Respirations 14 14    O2 Sat 99% 99    S/S 6 6    Pain Level 8/10 4/10     D/C home with husband, patient A & O X 3, D/C instructions reviewed, and sits independently.

## 2014-08-18 NOTE — Patient Instructions (Signed)

## 2014-08-18 NOTE — Progress Notes (Signed)
L L4 medial branch, L L5 dorsal ramus radiofrequency ablation  Left  S1,S2,S3, neurolysis under fluoro guidance  Informed consent was obtained after discussing risks and benefits of the procedure with the patient area and this includes bleeding, bruising, infection. Patient elects to proceed and has given written consent. Patient placed in a prone position low back and buttocks area on the right side marked and prepped with Betadine. Then a 25-gauge 1.5 inch needle was used to anesthetize the skin and subcutaneous tissue with 1% lidocaine, 2 cc into each of 5 areas. Then a 10 cm RF needle with a 1 cm curved active tip was inserted first targeting the junction of the sacroiliac and S1 SAP AP and lateral images confirm proper needle location followed by sensory and motor stimulation at 50 Hz and 2 Hz respectively confirming proper needle location then a solution containing 1 cc of 4 mg/cc dexamethasone and 4 cc of 1% MPF lidocaine was injected times 1 cc. RF lesioning 80C 90 seconds. Then the left L5 SAP transverse process junction was targeted needle placed at junction AP and lateral images confirm proper normal location. Sensory stimulation at 50 Hz followed by motor stimulation at 2 Hz confirm proper needle location followed by injection of 1 cc of the dexamethasone and lidocaine solution and radiofrequency lesioning 80C 90 seconds. Then the lateral aspect of the S1-S2 and S3 foramen or targeted on the left t side needle was placed just lateral to the foramen. Sensory stimulation showed elicited paresthesias. Followed by injection of 1 cc of the dexamethasone lidocaine solution and radiofrequency lesioning 80C 90 seconds. Patient tolerated procedure well Post procedure instructions given

## 2014-08-23 ENCOUNTER — Ambulatory Visit: Payer: Medicare Other | Admitting: Physical Medicine & Rehabilitation

## 2014-09-08 ENCOUNTER — Encounter: Payer: Self-pay | Admitting: Registered Nurse

## 2014-09-08 ENCOUNTER — Encounter: Payer: Medicare Other | Attending: Physical Medicine & Rehabilitation | Admitting: Registered Nurse

## 2014-09-08 VITALS — BP 110/60 | HR 77 | Resp 14

## 2014-09-08 DIAGNOSIS — Z5181 Encounter for therapeutic drug level monitoring: Secondary | ICD-10-CM | POA: Insufficient documentation

## 2014-09-08 DIAGNOSIS — M609 Myositis, unspecified: Secondary | ICD-10-CM | POA: Insufficient documentation

## 2014-09-08 DIAGNOSIS — G894 Chronic pain syndrome: Secondary | ICD-10-CM | POA: Diagnosis present

## 2014-09-08 DIAGNOSIS — M47817 Spondylosis without myelopathy or radiculopathy, lumbosacral region: Secondary | ICD-10-CM | POA: Insufficient documentation

## 2014-09-08 DIAGNOSIS — IMO0001 Reserved for inherently not codable concepts without codable children: Secondary | ICD-10-CM

## 2014-09-08 DIAGNOSIS — Z79899 Other long term (current) drug therapy: Secondary | ICD-10-CM | POA: Diagnosis not present

## 2014-09-08 DIAGNOSIS — M791 Myalgia: Secondary | ICD-10-CM | POA: Diagnosis not present

## 2014-09-08 DIAGNOSIS — M5010 Cervical disc disorder with radiculopathy, unspecified cervical region: Secondary | ICD-10-CM | POA: Diagnosis not present

## 2014-09-08 DIAGNOSIS — M461 Sacroiliitis, not elsewhere classified: Secondary | ICD-10-CM | POA: Diagnosis not present

## 2014-09-08 DIAGNOSIS — M5416 Radiculopathy, lumbar region: Secondary | ICD-10-CM | POA: Diagnosis not present

## 2014-09-08 DIAGNOSIS — G9050 Complex regional pain syndrome I, unspecified: Secondary | ICD-10-CM | POA: Diagnosis not present

## 2014-09-08 MED ORDER — HYDROCODONE-ACETAMINOPHEN 5-325 MG PO TABS: 1.0000 | ORAL_TABLET | Freq: Four times a day (QID) | ORAL | Status: DC | PRN

## 2014-09-08 MED ORDER — OXYMORPHONE HCL ER 10 MG PO T12A: 10.0000 mg | EXTENDED_RELEASE_TABLET | Freq: Two times a day (BID) | ORAL | Status: DC

## 2014-09-08 MED ORDER — DULOXETINE HCL 60 MG PO CPEP: ORAL_CAPSULE | ORAL | Status: DC

## 2014-09-08 MED ORDER — MELOXICAM 15 MG PO TABS: ORAL_TABLET | ORAL | Status: DC

## 2014-09-08 NOTE — Progress Notes (Signed)
Subjective:    Patient ID: Shannon French, female    DOB: 08-Oct-1965, 49 y.o.   MRN: [id number]  HPI: [name] is a 49 year old female who returns for follow up for chronic pain and medication refill. She says her pain is located in her neck, right shoulder and mid back. She rates her pain 5. Her current exercise regime is walking on treadmill 20 minutes daily, performing stretching and ball exercises. S/PL L4 medial branch, L L5 dorsal ramus radiofrequency ablation Left S1,S2,S3, neurolysis with good relief noted.  Pain Inventory Average Pain 7 Pain Right Now 5 My pain is constant, sharp, burning and aching  In the last 24 hours, has pain interfered with the following? General activity 5 Relation with others 5 Enjoyment of life 5 What TIME of day is your pain at its worst? all Sleep (in general) Fair  Pain is worse with: walking, bending, sitting, inactivity, standing and some activites Pain improves with: rest, heat/ice, therapy/exercise, pacing activities, medication and injections Relief from Meds: 5  Mobility walk without assistance how many minutes can you walk? 20 ability to climb steps?  yes do you drive?  yes Do you have any goals in this area?  yes  Function disabled: date disabled 2003 I need assistance with the following:  meal prep, household duties and shopping Do you have any goals in this area?  yes  Neuro/Psych numbness tingling trouble walking spasms dizziness anxiety  Prior Studies Any changes since last visit?  no  Physicians involved in your care Any changes since last visit?  no   Family History  Problem Relation Age of Onset  . Cancer Father    History   Social History  . Marital Status: Single    Spouse Name: N/A    Number of Children: N/A  . Years of Education: N/A   Social History Main Topics  . Smoking status: Former Smoker    Quit date: 10/14/2009  . Smokeless tobacco: Never Used  . Alcohol Use: No  . Drug Use: No   . Sexual Activity: None   Other Topics Concern  . None   Social History Narrative   Past Surgical History  Procedure Laterality Date  . Cholecystectomy    . Appendectomy    . Abdominal hysterectomy    . Ovary surgery     Past Medical History  Diagnosis Date  . Hypertension   . Diabetes mellitus   . Degenerative disc disease   . Osteoporosis   . Hyperlipidemia   . IBS (irritable bowel syndrome)   . Fibromyalgia   . Arm pain     left   BP 110/60 mmHg  Pulse 77  Resp 14  SpO2 98%  Opioid Risk Score:   Fall Risk Score: Low Fall Risk (0-5 points) Review of Systems  Musculoskeletal: Positive for gait problem.  Neurological: Positive for dizziness, weakness and numbness.       Tingling Spasms   Psychiatric/Behavioral: The patient is nervous/anxious.   All other systems reviewed and are negative.      Objective:   Physical Exam  Constitutional: She is oriented to person, place, and time. She appears well-developed and well-nourished.  HENT:  Head: Normocephalic and atraumatic.  Neck: Normal range of motion. Neck supple.  Cervical Paraspinal Tenderness: C-5- C-7  Cardiovascular: Normal rate and regular rhythm.   Pulmonary/Chest: Effort normal and breath sounds normal.  Musculoskeletal:  Normal Muscle Bulk and Muscle Testing Reveals: Upper Extremities: Full ROM and  Muscle Strength 5/5 Thoracic Paraspinal Tenderness: T-5- T-7 Lower Extremities: Full ROM and Muscle Strength 5/5 Arises from chair with ease Narrow Based gait    Neurological: She is alert and oriented to person, place, and time.  Skin: Skin is warm and dry.  Psychiatric: She has a normal mood and affect.  Nursing note and vitals reviewed.         Assessment & Plan:  1. Bilateral sacroiliac joint dysfunction:  Refilled: HYDROcodone 5/325mg  one tablet every 6 hrs as needed #90 and OPANA 10 mg one tablet every 12 hours #60. S/PLeft Radiofrequency Neurotomy : Good Relief Noted  2. Chronic  pain syndrome consistent with fibromyalgia. Continue with exercise regime, heat therapy,voltarengel, lidocaine patches and trileptal. Continue current analgesics  3. Right lumbar radiculopathy: No Complaints Today.Continue with Medication Regime and Current Treatment Modalities and Exercise Regime.  4. Complex Regional Pain Syndrome: Continue Current Medication and Exercise Regime. Dr. Yolanda BonineBertrand Following  5. Type 2 diabetes.: PMD Following   20 minutes of face to face patient care time was spent during this visit. All questions were encouraged and answered.   F/U in 1 month

## 2014-10-06 ENCOUNTER — Encounter: Payer: Self-pay | Admitting: Registered Nurse

## 2014-10-06 ENCOUNTER — Other Ambulatory Visit: Payer: Self-pay | Admitting: *Deleted

## 2014-10-06 ENCOUNTER — Encounter: Payer: Medicare Other | Attending: Physical Medicine & Rehabilitation | Admitting: Registered Nurse

## 2014-10-06 VITALS — BP 125/89 | HR 67 | Resp 14

## 2014-10-06 DIAGNOSIS — G9050 Complex regional pain syndrome I, unspecified: Secondary | ICD-10-CM | POA: Insufficient documentation

## 2014-10-06 DIAGNOSIS — M47817 Spondylosis without myelopathy or radiculopathy, lumbosacral region: Secondary | ICD-10-CM | POA: Insufficient documentation

## 2014-10-06 DIAGNOSIS — M609 Myositis, unspecified: Secondary | ICD-10-CM | POA: Diagnosis not present

## 2014-10-06 DIAGNOSIS — M791 Myalgia: Secondary | ICD-10-CM | POA: Insufficient documentation

## 2014-10-06 DIAGNOSIS — G894 Chronic pain syndrome: Secondary | ICD-10-CM | POA: Insufficient documentation

## 2014-10-06 DIAGNOSIS — M461 Sacroiliitis, not elsewhere classified: Secondary | ICD-10-CM | POA: Diagnosis not present

## 2014-10-06 DIAGNOSIS — Z79899 Other long term (current) drug therapy: Secondary | ICD-10-CM

## 2014-10-06 DIAGNOSIS — IMO0001 Reserved for inherently not codable concepts without codable children: Secondary | ICD-10-CM

## 2014-10-06 DIAGNOSIS — M5416 Radiculopathy, lumbar region: Secondary | ICD-10-CM | POA: Insufficient documentation

## 2014-10-06 DIAGNOSIS — M5010 Cervical disc disorder with radiculopathy, unspecified cervical region: Secondary | ICD-10-CM

## 2014-10-06 DIAGNOSIS — Z5181 Encounter for therapeutic drug level monitoring: Secondary | ICD-10-CM | POA: Diagnosis not present

## 2014-10-06 MED ORDER — MELOXICAM 15 MG PO TABS: ORAL_TABLET | ORAL | Status: DC

## 2014-10-06 MED ORDER — OXYMORPHONE HCL ER 10 MG PO T12A: 10.0000 mg | EXTENDED_RELEASE_TABLET | Freq: Two times a day (BID) | ORAL | Status: DC

## 2014-10-06 MED ORDER — OXCARBAZEPINE 150 MG PO TABS: ORAL_TABLET | ORAL | Status: DC

## 2014-10-06 MED ORDER — HYDROCODONE-ACETAMINOPHEN 5-325 MG PO TABS: 1.0000 | ORAL_TABLET | Freq: Four times a day (QID) | ORAL | Status: DC | PRN

## 2014-10-06 NOTE — Progress Notes (Signed)
Subjective:    Patient ID: Shannon French, female    DOB: July 17, 1966, 49 y.o.   MRN: [id number]  HPI: [name] is a 49 year old female who returns for follow up for chronic pain and medication refill. She's complaining of generalized pain all over today. She rates her pain 8. She's not following her usual exercise regime  she states she was diadnosed with pneumonia a few weeks ago on antibiotics. Also stated she was diagnosed with the Flu on 10/03/14. She's on Tamiflu.   Pain Inventory Average Pain 6 Pain Right Now 8 My pain is constant, sharp, burning, dull, stabbing, tingling and aching  In the last 24 hours, has pain interfered with the following? General activity 9 Relation with others 9 Enjoyment of life 9 What TIME of day is your pain at its worst? all Sleep (in general) Poor  Pain is worse with: walking, bending, sitting, inactivity, standing and some activites Pain improves with: rest, heat/ice, therapy/exercise, pacing activities, medication and injections Relief from Meds: 5  Mobility walk without assistance use a cane how many minutes can you walk? 10 ability to climb steps?  yes do you drive?  yes  Function disabled: date disabled 2003 I need assistance with the following:  meal prep, household duties and shopping  Neuro/Psych weakness numbness tingling trouble walking spasms anxiety  Prior Studies Any changes since last visit?  no  Physicians involved in your care Any changes since last visit?  no   Family History  Problem Relation Age of Onset  . Cancer Father    History   Social History  . Marital Status: Single    Spouse Name: N/A  . Number of Children: N/A  . Years of Education: N/A   Social History Main Topics  . Smoking status: Former Smoker    Quit date: 10/14/2009  . Smokeless tobacco: Never Used  . Alcohol Use: No  . Drug Use: No  . Sexual Activity: Not on file   Other Topics Concern  . None   Social History Narrative     Past Surgical History  Procedure Laterality Date  . Cholecystectomy    . Appendectomy    . Abdominal hysterectomy    . Ovary surgery     Past Medical History  Diagnosis Date  . Hypertension   . Diabetes mellitus   . Degenerative disc disease   . Osteoporosis   . Hyperlipidemia   . IBS (irritable bowel syndrome)   . Fibromyalgia   . Arm pain     left   BP 125/89 mmHg  Pulse 67  Resp 14  SpO2 95%  Opioid Risk Score:   Fall Risk Score: Low Fall Risk (0-5 points) (previously educated and given handout)  Review of Systems  Constitutional: Positive for fever, chills and appetite change.       Has had flu  No fever today and on tamiflu  Respiratory: Positive for cough.        Resp infections/flu  Gastrointestinal: Positive for nausea, vomiting and diarrhea.  Endocrine:       High blood sugar  Musculoskeletal: Positive for gait problem.       Spasms  Neurological: Positive for weakness and numbness.       Tingling  Psychiatric/Behavioral: The patient is nervous/anxious.   All other systems reviewed and are negative.      Objective:   Physical Exam  Constitutional: She is oriented to person, place, and time. She appears well-developed and well-nourished.  HENT:  Head: Normocephalic and atraumatic.  Neck: Normal range of motion. Neck supple.  Cervical Paraspinal Tenderness: C-3- C-5  Cardiovascular: Normal rate and regular rhythm.   Pulmonary/Chest: Effort normal and breath sounds normal.  Musculoskeletal:  Normal Muscle Bulk and Muscle Testing Reveals: Upper extremities: Full ROM and Muscle Strength 5/5 Thoracic Paraspinal Tenderness: T-1- T-2 T-7- T-10 Lower Extremities: Full ROM and Muscle Strength 5/5 Arises from chair with ease Narrow Based gait   Neurological: She is alert and oriented to person, place, and time.  Skin: Skin is warm and dry.  Psychiatric: She has a normal mood and affect.  Nursing note and vitals reviewed.         Assessment &  Plan:  1. Bilateral sacroiliac joint dysfunction:  Refilled: HYDROcodone 5/325mg  one tablet every 6 hrs as needed #90 and OPANA 10 mg one tablet every 12 hours #60. S/PLeft Radiofrequency Neurotomy : Good Relief Noted  2. Chronic pain syndrome consistent with fibromyalgia. Continue with exercise regime, heat therapy,voltarengel, lidocaine patches and trileptal. Continue current analgesics  3. Right lumbar radiculopathy: No Complaints Today.Continue with Medication Regime and Current Treatment Modalities and Exercise Regime.  4. Complex Regional Pain Syndrome: Continue Current Medication and Exercise Regime. Dr. Yolanda BonineBertrand Following  5. Type 2 diabetes.: PMD Following   20 minutes of face to face patient care time was spent during this visit. All questions were encouraged and answered.   F/U in 1 month

## 2014-10-19 ENCOUNTER — Other Ambulatory Visit: Payer: Self-pay | Admitting: Physical Medicine & Rehabilitation

## 2014-10-23 ENCOUNTER — Other Ambulatory Visit: Payer: Self-pay | Admitting: Physical Medicine & Rehabilitation

## 2014-11-04 ENCOUNTER — Encounter: Payer: Medicare Other | Attending: Physical Medicine & Rehabilitation | Admitting: Registered Nurse

## 2014-11-04 ENCOUNTER — Ambulatory Visit: Payer: Medicare Other | Admitting: Physical Medicine & Rehabilitation

## 2014-11-04 ENCOUNTER — Encounter: Payer: Self-pay | Admitting: Registered Nurse

## 2014-11-04 VITALS — BP 119/80 | HR 80 | Resp 14

## 2014-11-04 DIAGNOSIS — M461 Sacroiliitis, not elsewhere classified: Secondary | ICD-10-CM

## 2014-11-04 DIAGNOSIS — M5010 Cervical disc disorder with radiculopathy, unspecified cervical region: Secondary | ICD-10-CM | POA: Diagnosis not present

## 2014-11-04 DIAGNOSIS — M5416 Radiculopathy, lumbar region: Secondary | ICD-10-CM | POA: Diagnosis not present

## 2014-11-04 DIAGNOSIS — Z79899 Other long term (current) drug therapy: Secondary | ICD-10-CM

## 2014-11-04 DIAGNOSIS — M47817 Spondylosis without myelopathy or radiculopathy, lumbosacral region: Secondary | ICD-10-CM | POA: Diagnosis not present

## 2014-11-04 DIAGNOSIS — G894 Chronic pain syndrome: Secondary | ICD-10-CM

## 2014-11-04 DIAGNOSIS — Z5181 Encounter for therapeutic drug level monitoring: Secondary | ICD-10-CM

## 2014-11-04 DIAGNOSIS — M65949 Unspecified synovitis and tenosynovitis, unspecified hand: Secondary | ICD-10-CM

## 2014-11-04 DIAGNOSIS — G9050 Complex regional pain syndrome I, unspecified: Secondary | ICD-10-CM | POA: Diagnosis not present

## 2014-11-04 DIAGNOSIS — M791 Myalgia: Secondary | ICD-10-CM | POA: Diagnosis not present

## 2014-11-04 DIAGNOSIS — IMO0001 Reserved for inherently not codable concepts without codable children: Secondary | ICD-10-CM

## 2014-11-04 DIAGNOSIS — M609 Myositis, unspecified: Secondary | ICD-10-CM | POA: Insufficient documentation

## 2014-11-04 DIAGNOSIS — M6588 Other synovitis and tenosynovitis, other site: Secondary | ICD-10-CM

## 2014-11-04 DIAGNOSIS — M659 Synovitis and tenosynovitis, unspecified: Secondary | ICD-10-CM

## 2014-11-04 MED ORDER — OXYMORPHONE HCL ER 10 MG PO T12A: 10.0000 mg | EXTENDED_RELEASE_TABLET | Freq: Two times a day (BID) | ORAL | Status: DC

## 2014-11-04 MED ORDER — HYDROCODONE-ACETAMINOPHEN 5-325 MG PO TABS: 1.0000 | ORAL_TABLET | Freq: Four times a day (QID) | ORAL | Status: DC | PRN

## 2014-11-04 NOTE — Progress Notes (Signed)
Subjective:    Patient ID: Shannon French, female    DOB: 06/19/1966, 49 y.o.   MRN: [id number]  HPI: Shannon French is a 49 year old female who returns for follow up for chronic pain and medication refill. She says her pain is located in her neck, right middle finger, left arm and mid-back. She woke up this morning with left arm pain with burning and tingling sensation. She denies chest pain or SOB. She's allergic to gabapentin on Topamx. Also states she's unable to increase her topamax had an adverse reaction with increase dosage. She wants to speak to Dr. Riley KillSwartz next visit not interested in changing medication's at this time.  She rates her pain 9. Her current  exercise regime is walking 25 minutes a day when weather permits, also treadmill daily. Working in her garden and performing stretching exercises.   Pain Inventory Average Pain 6 Pain Right Now 9 My pain is constant, sharp, burning, dull, stabbing, tingling and aching  In the last 24 hours, has pain interfered with the following? General activity 8 Relation with others 8 Enjoyment of life 8 What TIME of day is your pain at its worst? all Sleep (in general) Fair  Pain is worse with: walking, bending, sitting, inactivity, standing and some activites Pain improves with: rest, heat/ice, therapy/exercise, pacing activities, medication and injections Relief from Meds: 5  Mobility walk without assistance walk with assistance use a cane how many minutes can you walk? 25 ability to climb steps?  yes do you drive?  yes transfers alone Do you have any goals in this area?  yes  Function disabled: date disabled 2003 I need assistance with the following:  meal prep, household duties and shopping Do you have any goals in this area?  yes  Neuro/Psych tingling anxiety  Prior Studies Any changes since last visit?  no  Physicians involved in your care Any changes since last visit?  no   Family History  Problem Relation Age  of Onset  . Cancer Father    History   Social History  . Marital Status: Single    Spouse Name: N/A  . Number of Children: N/A  . Years of Education: N/A   Social History Main Topics  . Smoking status: Former Smoker    Quit date: 10/14/2009  . Smokeless tobacco: Never Used  . Alcohol Use: No  . Drug Use: No  . Sexual Activity: Not on file   Other Topics Concern  . None   Social History Narrative   Past Surgical History  Procedure Laterality Date  . Cholecystectomy    . Appendectomy    . Abdominal hysterectomy    . Ovary surgery     Past Medical History  Diagnosis Date  . Hypertension   . Diabetes mellitus   . Degenerative disc disease   . Osteoporosis   . Hyperlipidemia   . IBS (irritable bowel syndrome)   . Fibromyalgia   . Arm pain     left   BP 119/80 mmHg  Pulse 80  Resp 14  SpO2 98%  Opioid Risk Score:   Fall Risk Score: Low Fall Risk (0-5 points)`1  Depression screen PHQ 2/9  Depression screen PHQ 2/9 11/04/2014  Decreased Interest 0  Down, Depressed, Hopeless 0  PHQ - 2 Score 0  Altered sleeping 3  Tired, decreased energy 1  Change in appetite 0  Feeling bad or failure about yourself  0  Trouble concentrating 0  Moving slowly or  fidgety/restless 0  Suicidal thoughts 0  PHQ-9 Score 4     Review of Systems  Neurological:       Tingling  Psychiatric/Behavioral: The patient is nervous/anxious.   All other systems reviewed and are negative.      Objective:   Physical Exam  Constitutional: She is oriented to person, place, and time. She appears well-developed and well-nourished.  HENT:  Head: Normocephalic and atraumatic.  Neck: Normal range of motion. Neck supple.  Cardiovascular: Normal rate and regular rhythm.   Pulmonary/Chest: Effort normal and breath sounds normal.  Musculoskeletal:  Normal Muscle Bulk and Muscle Testing Reveals: Upper Extremities: Full ROM and Muscle Strength 5/5 Thoracic Paraspinal Tenderness: T-2-  T-7 Lower Extremities: Full ROM and Muscle Strength 5/5 Arises from chair with ease Narrow Based Gait  Neurological: She is alert and oriented to person, place, and time.  Skin: Skin is warm and dry.  Psychiatric: She has a normal mood and affect.  Nursing note and vitals reviewed.         Assessment & Plan:  1. Bilateral sacroiliac joint dysfunction:  Refilled: HYDROcodone 5/325mg  one tablet every 6 hrs as needed #90 and OPANA 10 mg one tablet every 12 hours #60. 2. Chronic pain syndrome consistent with fibromyalgia. Continue with exercise regime, heat therapy,voltarengel, lidocaine patches and trileptal. Continue current analgesics  3. Right lumbar radiculopathy:Continue with Medication Regime and Current Treatment Modalities and Exercise Regime.  4. Complex Regional Pain Syndrome: Continue Current Medication and Exercise Regime. Dr. Yolanda Bonine Following  5. Type 2 diabetes.: PMD Following  6. Tenosynovitis right middle finger: Continue anti-inflammatory.  20 minutes of face to face patient care time was spent during this visit. All questions were encouraged and answered.   F/U in 1 month

## 2014-12-02 ENCOUNTER — Other Ambulatory Visit: Payer: Self-pay | Admitting: Physical Medicine & Rehabilitation

## 2014-12-02 ENCOUNTER — Encounter: Payer: Self-pay | Admitting: Physical Medicine & Rehabilitation

## 2014-12-02 ENCOUNTER — Encounter (HOSPITAL_BASED_OUTPATIENT_CLINIC_OR_DEPARTMENT_OTHER): Payer: Medicare Other | Admitting: Physical Medicine & Rehabilitation

## 2014-12-02 VITALS — BP 112/80 | HR 72 | Resp 14

## 2014-12-02 DIAGNOSIS — IMO0001 Reserved for inherently not codable concepts without codable children: Secondary | ICD-10-CM

## 2014-12-02 DIAGNOSIS — G894 Chronic pain syndrome: Secondary | ICD-10-CM | POA: Diagnosis not present

## 2014-12-02 DIAGNOSIS — Z79899 Other long term (current) drug therapy: Secondary | ICD-10-CM

## 2014-12-02 DIAGNOSIS — M791 Myalgia: Secondary | ICD-10-CM | POA: Diagnosis not present

## 2014-12-02 DIAGNOSIS — M5010 Cervical disc disorder with radiculopathy, unspecified cervical region: Secondary | ICD-10-CM

## 2014-12-02 DIAGNOSIS — M47817 Spondylosis without myelopathy or radiculopathy, lumbosacral region: Secondary | ICD-10-CM

## 2014-12-02 DIAGNOSIS — M609 Myositis, unspecified: Secondary | ICD-10-CM

## 2014-12-02 DIAGNOSIS — Z5181 Encounter for therapeutic drug level monitoring: Secondary | ICD-10-CM

## 2014-12-02 MED ORDER — OXYMORPHONE HCL ER 10 MG PO T12A: 10.0000 mg | EXTENDED_RELEASE_TABLET | Freq: Two times a day (BID) | ORAL | Status: DC

## 2014-12-02 MED ORDER — TAPENTADOL HCL 50 MG PO TABS: 50.0000 mg | ORAL_TABLET | Freq: Four times a day (QID) | ORAL | Status: DC | PRN

## 2014-12-02 MED ORDER — AMITRIPTYLINE HCL 10 MG PO TABS: 10.0000 mg | ORAL_TABLET | Freq: Every day | ORAL | Status: DC

## 2014-12-02 MED ORDER — HYDROCODONE-ACETAMINOPHEN 5-325 MG PO TABS: 1.0000 | ORAL_TABLET | Freq: Four times a day (QID) | ORAL | Status: DC | PRN

## 2014-12-02 NOTE — Progress Notes (Signed)
Subjective:    Patient ID: Shannon French, female    DOB: 10/01/1965, 49 y.o.   MRN: [id number]  HPI   Shannon French is here in follow up of her chronic pain. She hasn't seen me here in clinic since last summer. She's been followed by NP most recently.   She has been busy at home with family issues/care of her brother who's had two strokes.   From a pain standpoint her low back and hips are feeling much better. She is walking and getting up to about a mile with her ambulation currently.   Her biggest complaint is her left upper extremity. She's had pain 16/28 days of pain in her left upper ext which is gut wrenching. It seems to start in the neck. It starts with numnbess and then becomes stinging and burning. It's wrost at night.    Pain Inventory Average Pain 6 Pain Right Now 7 My pain is constant, sharp, burning, dull, stabbing, tingling and aching  In the last 24 hours, has pain interfered with the following? General activity 8 Relation with others 8 Enjoyment of life 8 What TIME of day is your pain at its worst? all Sleep (in general) Fair  Pain is worse with: walking, bending, sitting, inactivity, standing and some activites Pain improves with: rest, heat/ice, therapy/exercise, pacing activities, medication and injections Relief from Meds: 5  Mobility walk without assistance walk with assistance use a cane how many minutes can you walk? 30 ability to climb steps?  yes do you drive?  yes transfers alone Do you have any goals in this area?  yes  Function disabled: date disabled . I need assistance with the following:  meal prep, household duties and shopping  Neuro/Psych weakness numbness tingling anxiety  Prior Studies Any changes since last visit?  no  Physicians involved in your care Any changes since last visit?  no   Family History  Problem Relation Age of Onset  . Cancer Father    History   Social History  . Marital Status: Single    Spouse Name: N/A    . Number of Children: N/A  . Years of Education: N/A   Social History Main Topics  . Smoking status: Former Smoker    Quit date: 10/14/2009  . Smokeless tobacco: Never Used  . Alcohol Use: No  . Drug Use: No  . Sexual Activity: Not on file   Other Topics Concern  . None   Social History Narrative   Past Surgical History  Procedure Laterality Date  . Cholecystectomy    . Appendectomy    . Abdominal hysterectomy    . Ovary surgery     Past Medical History  Diagnosis Date  . Hypertension   . Diabetes mellitus   . Degenerative disc disease   . Osteoporosis   . Hyperlipidemia   . IBS (irritable bowel syndrome)   . Fibromyalgia   . Arm pain     left   BP 112/80 mmHg  Pulse 72  Resp 14  SpO2 99%  Opioid Risk Score:   Fall Risk Score: Low Fall Risk (0-5 points)`1  Depression screen PHQ 2/9  Depression screen PHQ 2/9 11/04/2014  Decreased Interest 0  Down, Depressed, Hopeless 0  PHQ - 2 Score 0  Altered sleeping 3  Tired, decreased energy 1  Change in appetite 0  Feeling bad or failure about yourself  0  Trouble concentrating 0  Moving slowly or fidgety/restless 0  Suicidal thoughts 0  PHQ-9  Score 4     Review of Systems  Neurological: Positive for weakness and numbness.       Tingling  Psychiatric/Behavioral: The patient is nervous/anxious.        Objective:   Physical Exam  Constitutional: She is oriented to person, place, and time. She appears well-developed and well-nourished.  HENT:  Head: Normocephalic and atraumatic.  Eyes: Conjunctivae and EOM are normal. Pupils are equal, round, and reactive to light.  Neck: Normal range of motion. Neck supple.  Cardiovascular: Normal rate and regular rhythm.  Pulmonary/Chest: Effort normal and breath sounds normal.  Abdominal: Soft.  Musculoskeletal:  PSIS are both very sensitive. Compression test and FABER test positive for SIJ's. Has limitations in lumbar ROM in all planes. She walks slowly with WB  gait.   SLR equivocal.  Reflexes remain 2+ on both UE's. There is no muscle spasm in the neck or shoulder girdle. Cervical ROM is fair. LUE is  less sensitive to touch in general and she uses left arm more spontaneously.     Neurological: She is alert and oriented to person, place, and time.  Reflex Scores:  Patellar reflexes are 1+ on the right side and 2+ on the left side.  Achilles reflexes are 1+ on the right side and 2+ on the left side. 3-4/5 right ADF, APF, KE, HF. 4+/5 on left    Psychiatric: She has a normal mood and affect. Her behavior is normal. Judgment and thought content normal. Cognition and memory are normal.  Assessment & Plan:   ASSESSMENT:  1. Bilateral sacroiliac joint dysfunction, status post radio  frequencies which have been efficacious. Pain has increased substantially again.  2. Chronic pain syndrome consistent with fibromyalgia.  3. ?Right lumbar radiculopathy  3. Type 2 diabetes.  4. CRPS 1? LUE---    PLAN:  1. Tizanidine for spasms and sleep. meloxicam prn for pain flares  2. Continue with desensitization exercises for LUE. Stay active.  3. Hydrocodone and opana were refilled. I also gave her a limited supply of nucynta  to judge its efficacy. If she finds it useful, we could also look at the long acting version in place of her opana   -consider followup SNB's with Dr. Yolanda Bonine 4. Add elavil 10-20mg  qhs for sleep/neuro pain---keep cymbalta at  for now  -stop trileptal 5. Follow up with me or NP in about one month. 30 minutes of face to face patient care time were spent during this visit. All questions were encouraged and answered.

## 2014-12-02 NOTE — Patient Instructions (Signed)
CHECK WITH YOUR PHARMACIST REGARDING COVERAGE OF NUCYNTA   TAKE  NUCYNTA IN PLACE OF YOUR HYDROCODONE----NOT WITH

## 2014-12-03 LAB — PMP ALCOHOL METABOLITE (ETG): Ethyl Glucuronide (EtG): NEGATIVE ng/mL

## 2014-12-05 LAB — OXYCODONE, URINE (LC/MS-MS)
Noroxycodone, Ur: NEGATIVE ng/mL (ref <50)
Oxycodone, ur: NEGATIVE ng/mL (ref <50)
Oxymorphone: 16101 ng/mL (ref <50)

## 2014-12-05 LAB — OPIATES/OPIOIDS (LC/MS-MS)
Codeine Urine: NEGATIVE ng/mL (ref <50)
Hydrocodone: 1340 ng/mL (ref <50)
Hydromorphone: NEGATIVE ng/mL — AB (ref <50)
Morphine Urine: NEGATIVE ng/mL (ref <50)
NORHYDROCODONE, UR: 1965 ng/mL (ref <50)
NOROXYCODONE, UR: NEGATIVE ng/mL (ref <50)
OXYMORPHONE, URINE: 16101 ng/mL (ref <50)
Oxycodone, ur: NEGATIVE ng/mL (ref <50)

## 2014-12-06 LAB — PRESCRIPTION MONITORING PROFILE (SOLSTAS)
Amphetamine/Meth: NEGATIVE ng/mL
Barbiturate Screen, Urine: NEGATIVE ng/mL
Benzodiazepine Screen, Urine: NEGATIVE ng/mL
Buprenorphine, Urine: NEGATIVE ng/mL
Cannabinoid Scrn, Ur: NEGATIVE ng/mL
Carisoprodol, Urine: NEGATIVE ng/mL
Cocaine Metabolites: NEGATIVE ng/mL
Creatinine, Urine: 90.5 mg/dL (ref >20.0)
Fentanyl, Ur: NEGATIVE ng/mL
MDMA URINE: NEGATIVE ng/mL
METHADONE SCREEN, URINE: NEGATIVE ng/mL
Meperidine, Ur: NEGATIVE ng/mL
NITRITES URINE, INITIAL: NEGATIVE ug/mL
PH URINE, INITIAL: 7.2 pH (ref 4.5–8.9)
Propoxyphene: NEGATIVE ng/mL
TRAMADOL UR: NEGATIVE ng/mL
Tapentadol, urine: NEGATIVE ng/mL
Zolpidem, Urine: NEGATIVE ng/mL

## 2014-12-20 NOTE — Progress Notes (Signed)
Urine drug screen for this encounter is consistent for prescribed medication 

## 2015-01-09 ENCOUNTER — Encounter: Payer: Self-pay | Admitting: Registered Nurse

## 2015-01-09 ENCOUNTER — Encounter: Payer: Medicare Other | Attending: Physical Medicine & Rehabilitation | Admitting: Registered Nurse

## 2015-01-09 VITALS — BP 134/72 | HR 71 | Resp 16

## 2015-01-09 DIAGNOSIS — M47817 Spondylosis without myelopathy or radiculopathy, lumbosacral region: Secondary | ICD-10-CM

## 2015-01-09 DIAGNOSIS — Z79899 Other long term (current) drug therapy: Secondary | ICD-10-CM | POA: Diagnosis not present

## 2015-01-09 DIAGNOSIS — Z5181 Encounter for therapeutic drug level monitoring: Secondary | ICD-10-CM

## 2015-01-09 DIAGNOSIS — M609 Myositis, unspecified: Secondary | ICD-10-CM | POA: Diagnosis not present

## 2015-01-09 DIAGNOSIS — M791 Myalgia: Secondary | ICD-10-CM | POA: Insufficient documentation

## 2015-01-09 DIAGNOSIS — M5416 Radiculopathy, lumbar region: Secondary | ICD-10-CM | POA: Diagnosis not present

## 2015-01-09 DIAGNOSIS — IMO0001 Reserved for inherently not codable concepts without codable children: Secondary | ICD-10-CM

## 2015-01-09 DIAGNOSIS — M5010 Cervical disc disorder with radiculopathy, unspecified cervical region: Secondary | ICD-10-CM

## 2015-01-09 DIAGNOSIS — M461 Sacroiliitis, not elsewhere classified: Secondary | ICD-10-CM | POA: Insufficient documentation

## 2015-01-09 DIAGNOSIS — G9050 Complex regional pain syndrome I, unspecified: Secondary | ICD-10-CM | POA: Diagnosis not present

## 2015-01-09 DIAGNOSIS — G894 Chronic pain syndrome: Secondary | ICD-10-CM | POA: Diagnosis not present

## 2015-01-09 MED ORDER — HYDROCODONE-ACETAMINOPHEN 5-325 MG PO TABS: 1.0000 | ORAL_TABLET | Freq: Four times a day (QID) | ORAL | Status: DC | PRN

## 2015-01-09 MED ORDER — DULOXETINE HCL 30 MG PO CPEP: 30.0000 mg | ORAL_CAPSULE | Freq: Every day | ORAL | Status: DC

## 2015-01-09 MED ORDER — OXYMORPHONE HCL ER 10 MG PO T12A: 10.0000 mg | EXTENDED_RELEASE_TABLET | Freq: Two times a day (BID) | ORAL | Status: DC

## 2015-01-09 NOTE — Progress Notes (Signed)
Subjective:    Patient ID: Shannon EaringSally French, female    DOB: 05/08/1966, 49 y.o.   MRN: [id number]  HPI: [name] is a 49 year old female who returns for follow up for chronic pain and medication refill. She says her pain is located in her right elbow,  left arm and mid-back. She rates her pain 9. Her current exercise regime is walking and performing stretching exercises.  We discuss medication changes, she wants to wait another week. She was instructed to call office next week she verbalizes understanding.  Pain Inventory Average Pain 8 Pain Right Now 8 My pain is constant, sharp, burning, dull, stabbing, tingling and aching  In the last 24 hours, has pain interfered with the following? General activity 8 Relation with others 8 Enjoyment of life 8 What TIME of day is your pain at its worst? all Sleep (in general) Fair  Pain is worse with: walking, bending, sitting, inactivity, standing and some activites Pain improves with: rest, heat/ice, therapy/exercise, pacing activities, medication and injections Relief from Meds: 3  Mobility walk without assistance use a cane how many minutes can you walk? 30 ability to climb steps?  yes do you drive?  yes  Function disabled: date disabled 2003 I need assistance with the following:  meal prep, household duties and shopping  Neuro/Psych weakness numbness tingling spasms anxiety  Prior Studies Any changes since last visit?  no  Physicians involved in your care Any changes since last visit?  no   Family History  Problem Relation Age of Onset  . Cancer Father    History   Social History  . Marital Status: Single    Spouse Name: N/A  . Number of Children: N/A  . Years of Education: N/A   Social History Main Topics  . Smoking status: Former Smoker    Quit date: 10/14/2009  . Smokeless tobacco: Never Used  . Alcohol Use: No  . Drug Use: No  . Sexual Activity: Not on file   Other Topics Concern  . None    Social History Narrative   Past Surgical History  Procedure Laterality Date  . Cholecystectomy    . Appendectomy    . Abdominal hysterectomy    . Ovary surgery     Past Medical History  Diagnosis Date  . Hypertension   . Diabetes mellitus   . Degenerative disc disease   . Osteoporosis   . Hyperlipidemia   . IBS (irritable bowel syndrome)   . Fibromyalgia   . Arm pain     left   BP 134/72 mmHg  Pulse 71  Resp 16  SpO2 96%  Opioid Risk Score:   Fall Risk Score: Low Fall Risk (0-5 points)`1  Depression screen PHQ 2/9  Depression screen PHQ 2/9 11/04/2014  Decreased Interest 0  Down, Depressed, Hopeless 0  PHQ - 2 Score 0  Altered sleeping 3  Tired, decreased energy 1  Change in appetite 0  Feeling bad or failure about yourself  0  Trouble concentrating 0  Moving slowly or fidgety/restless 0  Suicidal thoughts 0  PHQ-9 Score 4     Review of Systems  Neurological: Positive for weakness and numbness.       Tingling  Psychiatric/Behavioral: The patient is nervous/anxious.   All other systems reviewed and are negative.      Objective:   Physical Exam  Constitutional: She is oriented to person, place, and time. She appears well-developed and well-nourished.  HENT:  Head: Normocephalic  and atraumatic.  Neck: Normal range of motion. Neck supple.  Cardiovascular: Normal rate and regular rhythm.   Pulmonary/Chest: Effort normal and breath sounds normal.  Musculoskeletal:  Normal Muscle Bulk and Muscle Testing Reveals: Upper Extremities: Full ROM and Muscle Strength 5/5 Thoracic Paraspinal Tenderness: T-6- T-8 Lower Extremities: Full ROM and Muscle Strength 5/5 Arises from chair with ease Narrow Based Gait   Neurological: She is alert and oriented to person, place, and time.  Skin: Skin is warm and dry.  Psychiatric: She has a normal mood and affect.  Nursing note and vitals reviewed.         Assessment & Plan:  1. Bilateral sacroiliac joint  dysfunction:  Refilled: HYDROcodone 5/325mg  one tablet every 6 hrs as needed #90 and OPANA 10 mg one tablet every 12 hours #60. 2. Chronic pain syndrome consistent with fibromyalgia. Continue with exercise regime, heat therapy,voltarengel, lidocaine patches and trileptal. Continue current analgesics  3. Right lumbar radiculopathy:Continue with Medication Regime and Current Treatment Modalities and Exercise Regime.  4. Complex Regional Pain Syndrome: Continue Current Medication and Exercise Regime. Dr. Yolanda Bonine Following  5. Type 2 diabetes.: PMD Following   20 minutes of face to face patient care time was spent during this visit. All questions were encouraged and answered.

## 2015-02-17 ENCOUNTER — Encounter: Payer: Self-pay | Admitting: Registered Nurse

## 2015-02-17 ENCOUNTER — Encounter: Payer: Medicare Other | Attending: Physical Medicine & Rehabilitation | Admitting: Registered Nurse

## 2015-02-17 VITALS — BP 127/74 | HR 74 | Resp 14

## 2015-02-17 DIAGNOSIS — M47817 Spondylosis without myelopathy or radiculopathy, lumbosacral region: Secondary | ICD-10-CM | POA: Diagnosis not present

## 2015-02-17 DIAGNOSIS — Z79899 Other long term (current) drug therapy: Secondary | ICD-10-CM

## 2015-02-17 DIAGNOSIS — M609 Myositis, unspecified: Secondary | ICD-10-CM | POA: Insufficient documentation

## 2015-02-17 DIAGNOSIS — G9050 Complex regional pain syndrome I, unspecified: Secondary | ICD-10-CM | POA: Insufficient documentation

## 2015-02-17 DIAGNOSIS — M461 Sacroiliitis, not elsewhere classified: Secondary | ICD-10-CM | POA: Insufficient documentation

## 2015-02-17 DIAGNOSIS — IMO0001 Reserved for inherently not codable concepts without codable children: Secondary | ICD-10-CM

## 2015-02-17 DIAGNOSIS — G894 Chronic pain syndrome: Secondary | ICD-10-CM

## 2015-02-17 DIAGNOSIS — M5416 Radiculopathy, lumbar region: Secondary | ICD-10-CM | POA: Diagnosis not present

## 2015-02-17 DIAGNOSIS — M5010 Cervical disc disorder with radiculopathy, unspecified cervical region: Secondary | ICD-10-CM

## 2015-02-17 DIAGNOSIS — M791 Myalgia: Secondary | ICD-10-CM | POA: Diagnosis not present

## 2015-02-17 DIAGNOSIS — Z5181 Encounter for therapeutic drug level monitoring: Secondary | ICD-10-CM | POA: Diagnosis not present

## 2015-02-17 MED ORDER — OXYMORPHONE HCL ER 10 MG PO T12A: 10.0000 mg | EXTENDED_RELEASE_TABLET | Freq: Two times a day (BID) | ORAL | Status: DC

## 2015-02-17 MED ORDER — HYDROCODONE-ACETAMINOPHEN 5-325 MG PO TABS: 1.0000 | ORAL_TABLET | Freq: Four times a day (QID) | ORAL | Status: DC | PRN

## 2015-02-17 NOTE — Progress Notes (Signed)
Subjective:    Patient ID: Shannon EaringSally Creasy, female    DOB: 12/11/1965, 49 y.o.   MRN: [id number]  HPI: [name] is a 49 year old female who returns for follow up for chronic pain and medication refill. She says her pain is located in her neck, right arm from elbow to hand, left arm with tingling and numbness and mid-back. She rates her pain 5. Her current exercise regime is pool therapy daily. walking and performing stretching exercises.   Pain Inventory Average Pain 3 Pain Right Now 5 My pain is constant, sharp, burning, dull, stabbing, tingling and aching  In the last 24 hours, has pain interfered with the following? General activity 3 Relation with others 3 Enjoyment of life 3 What TIME of day is your pain at its worst? all Sleep (in general) Poor  Pain is worse with: walking, bending, sitting, inactivity, standing and some activites Pain improves with: rest, heat/ice, therapy/exercise, pacing activities, medication and injections Relief from Meds: 3  Mobility walk without assistance walk with assistance use a cane how many minutes can you walk? 30 ability to climb steps?  yes do you drive?  yes transfers alone Do you have any goals in this area?  yes  Function disabled: date disabled 2003 I need assistance with the following:  meal prep, household duties and shopping Do you have any goals in this area?  yes  Neuro/Psych weakness numbness tingling spasms anxiety  Prior Studies Any changes since last visit?  no  Physicians involved in your care Any changes since last visit?  no   Family History  Problem Relation Age of Onset  . Cancer Father    History   Social History  . Marital Status: Single    Spouse Name: N/A  . Number of Children: N/A  . Years of Education: N/A   Social History Main Topics  . Smoking status: Former Smoker    Quit date: 10/14/2009  . Smokeless tobacco: Never Used  . Alcohol Use: No  . Drug Use: No  . Sexual  Activity: Not on file   Other Topics Concern  . None   Social History Narrative   Past Surgical History  Procedure Laterality Date  . Cholecystectomy    . Appendectomy    . Abdominal hysterectomy    . Ovary surgery     Past Medical History  Diagnosis Date  . Hypertension   . Diabetes mellitus   . Degenerative disc disease   . Osteoporosis   . Hyperlipidemia   . IBS (irritable bowel syndrome)   . Fibromyalgia   . Arm pain     left   BP 127/74 mmHg  Pulse 74  Resp 14  SpO2 98%  Opioid Risk Score:   Fall Risk Score: Low Fall Risk (0-5 points)`1  Depression screen PHQ 2/9  Depression screen PHQ 2/9 11/04/2014  Decreased Interest 0  Down, Depressed, Hopeless 0  PHQ - 2 Score 0  Altered sleeping 3  Tired, decreased energy 1  Change in appetite 0  Feeling bad or failure about yourself  0  Trouble concentrating 0  Moving slowly or fidgety/restless 0  Suicidal thoughts 0  PHQ-9 Score 4     Review of Systems  Neurological: Positive for weakness and numbness.       Tingling Spasms   Psychiatric/Behavioral: The patient is nervous/anxious.   All other systems reviewed and are negative.      Objective:   Physical Exam  Constitutional: She  is oriented to person, place, and time. She appears well-developed and well-nourished.  HENT:  Head: Normocephalic and atraumatic.  Neck: Normal range of motion. Neck supple.  Cervical Paraspinal Tenderness: C-1- C-4  Cardiovascular: Normal rate and regular rhythm.   Pulmonary/Chest: Effort normal and breath sounds normal.  Musculoskeletal:  Normal Muscle Bulk and Muscle Testing Reveals: Upper Extremities: Full ROM and Muscle Strength 5/5 Thoracic Paraspinal Tenderness: T-1- T-2 T-4- T-6 Lower Extremities: Full ROM and Muscle Strength 5/5 Arises from chair with ease Narrow Based Gait  Neurological: She is alert and oriented to person, place, and time.  Skin: Skin is warm and dry.  Psychiatric: She has a normal mood and  affect.  Nursing note and vitals reviewed.         Assessment & Plan:  1. Bilateral sacroiliac joint dysfunction:  Refilled: HYDROcodone 5/325mg  one tablet every 6 hrs as needed #90 and OPANA 10 mg one tablet every 12 hours #60. 2. Chronic pain syndrome consistent with fibromyalgia. Continue with exercise regime, heat therapy,voltarengel, lidocaine patches and trileptal. Continue current analgesics  3. Right lumbar radiculopathy:Continue with Medication Regime and Current Treatment Modalities and Exercise Regime.  4. Complex Regional Pain Syndrome: Continue Current Medication and Exercise Regime. Dr. Yolanda Bonine Following  5. Type 2 diabetes.: PMD Following   20 minutes of face to face patient care time was spent during this visit. All questions were encouraged and answered.

## 2015-02-21 ENCOUNTER — Other Ambulatory Visit: Payer: Self-pay | Admitting: Registered Nurse

## 2015-02-27 ENCOUNTER — Other Ambulatory Visit: Payer: Self-pay | Admitting: Physical Medicine & Rehabilitation

## 2015-02-27 NOTE — Telephone Encounter (Signed)
Called in prescription

## 2015-03-15 ENCOUNTER — Other Ambulatory Visit: Payer: Self-pay | Admitting: Registered Nurse

## 2015-03-20 ENCOUNTER — Encounter: Payer: Medicare Other | Attending: Physical Medicine & Rehabilitation | Admitting: Registered Nurse

## 2015-03-20 ENCOUNTER — Other Ambulatory Visit: Payer: Self-pay | Admitting: Registered Nurse

## 2015-03-20 ENCOUNTER — Encounter: Payer: Self-pay | Admitting: Registered Nurse

## 2015-03-20 VITALS — BP 136/86 | HR 86

## 2015-03-20 DIAGNOSIS — M5010 Cervical disc disorder with radiculopathy, unspecified cervical region: Secondary | ICD-10-CM | POA: Diagnosis not present

## 2015-03-20 DIAGNOSIS — M7062 Trochanteric bursitis, left hip: Secondary | ICD-10-CM

## 2015-03-20 DIAGNOSIS — M47817 Spondylosis without myelopathy or radiculopathy, lumbosacral region: Secondary | ICD-10-CM | POA: Diagnosis not present

## 2015-03-20 DIAGNOSIS — IMO0001 Reserved for inherently not codable concepts without codable children: Secondary | ICD-10-CM

## 2015-03-20 DIAGNOSIS — M609 Myositis, unspecified: Secondary | ICD-10-CM | POA: Diagnosis not present

## 2015-03-20 DIAGNOSIS — G9050 Complex regional pain syndrome I, unspecified: Secondary | ICD-10-CM | POA: Insufficient documentation

## 2015-03-20 DIAGNOSIS — Z79899 Other long term (current) drug therapy: Secondary | ICD-10-CM | POA: Insufficient documentation

## 2015-03-20 DIAGNOSIS — M791 Myalgia: Secondary | ICD-10-CM | POA: Diagnosis not present

## 2015-03-20 DIAGNOSIS — M5416 Radiculopathy, lumbar region: Secondary | ICD-10-CM | POA: Diagnosis not present

## 2015-03-20 DIAGNOSIS — M461 Sacroiliitis, not elsewhere classified: Secondary | ICD-10-CM | POA: Diagnosis not present

## 2015-03-20 DIAGNOSIS — Z5181 Encounter for therapeutic drug level monitoring: Secondary | ICD-10-CM | POA: Diagnosis not present

## 2015-03-20 DIAGNOSIS — G894 Chronic pain syndrome: Secondary | ICD-10-CM | POA: Diagnosis present

## 2015-03-20 DIAGNOSIS — M7061 Trochanteric bursitis, right hip: Secondary | ICD-10-CM

## 2015-03-20 MED ORDER — HYDROCODONE-ACETAMINOPHEN 5-325 MG PO TABS: 1.0000 | ORAL_TABLET | Freq: Four times a day (QID) | ORAL | Status: DC | PRN

## 2015-03-20 MED ORDER — OXYMORPHONE HCL ER 10 MG PO T12A: 10.0000 mg | EXTENDED_RELEASE_TABLET | Freq: Two times a day (BID) | ORAL | Status: DC

## 2015-03-20 NOTE — Progress Notes (Signed)
Subjective:    Patient ID: Shannon French, female    DOB: [birth year], 49 y.o.   MRN: [id number]  HPI: Shannon French is a 49 year old female who returns for follow up for chronic pain and medication refill. She says her pain is located in her neck, right arm from elbow to hand, left arm with tingling and numbness and mid-back. Also states she's having generalized pain. She rates her pain 9. Her current exercise regime is pool therapy daily. walking and performing stretching exercises.  Shannon French very tearful emotional support given, her brother passed, she return from [place] this morning spreading his ashes. She admits she had a valium on July 3 rd she was hysterical with her brother passing. A family member gave her a valium. She is aware of the narcotic contract and verbalizes understanding. Encouraged to call office with any questions or concerns she verbalizes understanding. Encouraged counseling.   Pain Inventory Average Pain 7 Pain Right Now 9 My pain is constant, sharp, burning, dull, stabbing, tingling and aching  In the last 24 hours, has pain interfered with the following? General activity 1 Relation with others 1 Enjoyment of life 0 What TIME of day is your pain at its worst? Morning, Daytime, Evening and Night Sleep (in general) Poor  Pain is worse with: walking, bending, sitting, inactivity, standing and some activites Pain improves with: medication Relief from Meds: 0  Mobility walk without assistance walk with assistance use a cane how many minutes can you walk? 30 ability to climb steps?  yes do you drive?  yes Do you have any goals in this area?  yes  Function disabled: date disabled 2003 Do you have any goals in this area?  yes  Neuro/Psych weakness numbness tingling trouble walking spasms anxiety  Prior Studies Any changes since last visit?  no  Physicians involved in your care Any changes since last visit?  no   Family History  Problem  Relation Age of Onset  . Cancer Father    Social History   Social History  . Marital Status: Single    Spouse Name: N/A  . Number of Children: N/A  . Years of Education: N/A   Social History Main Topics  . Smoking status: Former Smoker    Quit date: 10/14/2009  . Smokeless tobacco: Never Used  . Alcohol Use: No  . Drug Use: No  . Sexual Activity: Not Asked   Other Topics Concern  . None   Social History Narrative   Past Surgical History  Procedure Laterality Date  . Cholecystectomy    . Appendectomy    . Abdominal hysterectomy    . Ovary surgery     Past Medical History  Diagnosis Date  . Hypertension   . Diabetes mellitus   . Degenerative disc disease   . Osteoporosis   . Hyperlipidemia   . IBS (irritable bowel syndrome)   . Fibromyalgia   . Arm pain     left   BP 136/86 mmHg  Pulse 86  SpO2 98%  Opioid Risk Score:   Fall Risk Score:  `1  Depression screen PHQ 2/9  Depression screen [facility] 2/9 03/20/2015 11/04/2014  Decreased Interest 0 0  Down, Depressed, Hopeless 0 0  PHQ - 2 Score 0 0  Altered sleeping - 3  Tired, decreased energy - 1  Change in appetite - 0  Feeling bad or failure about yourself  - 0  Trouble concentrating - 0  Moving slowly or  fidgety/restless - 0  Suicidal thoughts - 0  PHQ-9 Score - 4     Review of Systems  Musculoskeletal: Positive for gait problem.       Spasms  Neurological: Positive for weakness and numbness.       Tingling  Psychiatric/Behavioral: The patient is nervous/anxious.   All other systems reviewed and are negative.      Objective:   Physical Exam  Constitutional: She is oriented to person, place, and time. She appears well-developed and well-nourished.  HENT:  Head: Normocephalic and atraumatic.  Neck: Normal range of motion. Neck supple.  Cervical Paraspinal Tenderness: C-5- C-6  Cardiovascular: Normal rate and regular rhythm.   Pulmonary/Chest: Effort normal and breath sounds normal.    Musculoskeletal:  Normal Muscle Bulk and Muscle Testing Reveals: Upper Extremities: Full ROM and Muscle Strength 5/5 Thoracic and Lumbar Hypersensitivity Lower Extremities: Full ROM and Muscle Strength 5/5 Arises from chair with ease Narrow Based gait  Neurological: She is alert and oriented to person, place, and time.  Skin: Skin is warm and dry.  Psychiatric: She has a normal mood and affect.  Nursing note and vitals reviewed.         Assessment & Plan:  1. Bilateral sacroiliac joint dysfunction:  Refilled: HYDROcodone 5/325mg  one tablet every 6 hrs as needed #90 and OPANA 10 mg one tablet every 12 hours #60. 2. Chronic pain syndrome consistent with fibromyalgia. Continue with exercise regime, heat therapy,voltarengel, lidocaine patches and trileptal. Continue current analgesics  3. Right lumbar radiculopathy:Continue with Medication Regime and Current Treatment Modalities and Exercise Regime.  4. Complex Regional Pain Syndrome: Continue Current Medication and Exercise Regime. Dr. Yolanda Bonine Following  5. Type 2 diabetes.: PMD Following   20 minutes of face to face patient care time was spent during this visit. All questions were encouraged and answered.

## 2015-03-21 LAB — PMP ALCOHOL METABOLITE (ETG): Ethyl Glucuronide (EtG): NEGATIVE ng/mL

## 2015-03-24 LAB — PRESCRIPTION MONITORING PROFILE (SOLSTAS)
Amphetamine/Meth: NEGATIVE ng/mL
BARBITURATE SCREEN, URINE: NEGATIVE ng/mL
BENZODIAZEPINE SCREEN, URINE: NEGATIVE ng/mL
Buprenorphine, Urine: NEGATIVE ng/mL
Cannabinoid Scrn, Ur: NEGATIVE ng/mL
Carisoprodol, Urine: NEGATIVE ng/mL
Cocaine Metabolites: NEGATIVE ng/mL
Creatinine, Urine: 12.38 mg/dL — ABNORMAL LOW (ref >20.0)
Fentanyl, Ur: NEGATIVE ng/mL
MDMA URINE: NEGATIVE ng/mL
Meperidine, Ur: NEGATIVE ng/mL
Methadone Screen, Urine: NEGATIVE ng/mL
NITRITES URINE, INITIAL: NEGATIVE ug/mL
PH URINE, INITIAL: 5.2 pH (ref 4.5–8.9)
Propoxyphene: NEGATIVE ng/mL
TAPENTADOLUR: NEGATIVE ng/mL
TRAMADOL UR: NEGATIVE ng/mL
Zolpidem, Urine: NEGATIVE ng/mL

## 2015-03-24 LAB — OXYCODONE, URINE (LC/MS-MS)
NOROXYCODONE, UR: NEGATIVE ng/mL (ref <50)
Oxycodone, ur: NEGATIVE ng/mL (ref <50)
Oxymorphone: 1038 ng/mL (ref <50)

## 2015-03-24 LAB — OPIATES/OPIOIDS (LC/MS-MS)
Codeine Urine: NEGATIVE ng/mL (ref <50)
Hydrocodone: 134 ng/mL (ref <50)
Hydromorphone: NEGATIVE ng/mL — AB (ref <50)
Morphine Urine: NEGATIVE ng/mL (ref <50)
Norhydrocodone, Ur: 197 ng/mL (ref <50)
Noroxycodone, Ur: NEGATIVE ng/mL (ref <50)
OXYCODONE, UR: NEGATIVE ng/mL (ref <50)
OXYMORPHONE, URINE: 1038 ng/mL (ref <50)

## 2015-03-30 NOTE — Progress Notes (Signed)
Urine drug screen for this encounter is consistent for prescribed medication 

## 2015-04-14 ENCOUNTER — Other Ambulatory Visit: Payer: Self-pay | Admitting: Registered Nurse

## 2015-04-21 ENCOUNTER — Encounter: Payer: Medicare Other | Attending: Physical Medicine & Rehabilitation | Admitting: Registered Nurse

## 2015-04-21 ENCOUNTER — Encounter: Payer: Self-pay | Admitting: Registered Nurse

## 2015-04-21 VITALS — BP 110/75 | HR 69 | Resp 16

## 2015-04-21 DIAGNOSIS — M5010 Cervical disc disorder with radiculopathy, unspecified cervical region: Secondary | ICD-10-CM | POA: Diagnosis not present

## 2015-04-21 DIAGNOSIS — M47817 Spondylosis without myelopathy or radiculopathy, lumbosacral region: Secondary | ICD-10-CM | POA: Diagnosis not present

## 2015-04-21 DIAGNOSIS — IMO0001 Reserved for inherently not codable concepts without codable children: Secondary | ICD-10-CM

## 2015-04-21 DIAGNOSIS — M461 Sacroiliitis, not elsewhere classified: Secondary | ICD-10-CM | POA: Insufficient documentation

## 2015-04-21 DIAGNOSIS — M791 Myalgia: Secondary | ICD-10-CM | POA: Insufficient documentation

## 2015-04-21 DIAGNOSIS — M609 Myositis, unspecified: Secondary | ICD-10-CM | POA: Insufficient documentation

## 2015-04-21 DIAGNOSIS — G9050 Complex regional pain syndrome I, unspecified: Secondary | ICD-10-CM | POA: Insufficient documentation

## 2015-04-21 DIAGNOSIS — Z5181 Encounter for therapeutic drug level monitoring: Secondary | ICD-10-CM | POA: Diagnosis not present

## 2015-04-21 DIAGNOSIS — G894 Chronic pain syndrome: Secondary | ICD-10-CM | POA: Diagnosis present

## 2015-04-21 DIAGNOSIS — M5416 Radiculopathy, lumbar region: Secondary | ICD-10-CM | POA: Insufficient documentation

## 2015-04-21 DIAGNOSIS — Z79899 Other long term (current) drug therapy: Secondary | ICD-10-CM

## 2015-04-21 MED ORDER — HYDROCODONE-ACETAMINOPHEN 5-325 MG PO TABS: 1.0000 | ORAL_TABLET | Freq: Four times a day (QID) | ORAL | Status: DC | PRN

## 2015-04-21 MED ORDER — OXYMORPHONE HCL ER 10 MG PO T12A: 10.0000 mg | EXTENDED_RELEASE_TABLET | Freq: Two times a day (BID) | ORAL | Status: DC

## 2015-04-21 NOTE — Progress Notes (Signed)
Subjective:    Patient ID: Shannon French, female    DOB: 12-28-1965, 49 y.o.   MRN: [id number]  HPI: [name] is a 49 year old female who returns for follow up for chronic pain and medication refill. She says her pain is located in her bilateral hands,neck and mid-lowback. She rates her pain 6. Her current exercise regime is pool therapy three times a week. walking and performing stretching exercises. She forgot her medications instructed to bring  her medications every appointment she verbalizes understanding.  Pain Inventory Average Pain 6 Pain Right Now 6 My pain is constant, sharp, burning, dull, stabbing, tingling and aching  In the last 24 hours, has pain interfered with the following? General activity 4 Relation with others 3 Enjoyment of life 3 What TIME of day is your pain at its worst? morning, daytime, evening Sleep (in general) Fair  Pain is worse with: walking, bending, sitting, inactivity, standing and some activites Pain improves with: rest, heat/ice, therapy/exercise, pacing activities and medication Relief from Meds: 3  Mobility walk without assistance walk with assistance use a cane how many minutes can you walk? 45 ability to climb steps?  yes do you drive?  yes Do you have any goals in this area?  yes  Function disabled: date disabled 2003 I need assistance with the following:  meal prep, household duties and shopping Do you have any goals in this area?  yes  Neuro/Psych weakness numbness tingling trouble walking spasms anxiety  Prior Studies Any changes since last visit?  no  Physicians involved in your care Any changes since last visit?  no   Family History  Problem Relation Age of Onset  . Cancer Father    Social History   Social History  . Marital Status: Single    Spouse Name: N/A  . Number of Children: N/A  . Years of Education: N/A   Social History Main Topics  . Smoking status: Former Smoker    Quit date: 10/14/2009   . Smokeless tobacco: Never Used  . Alcohol Use: No  . Drug Use: No  . Sexual Activity: Not Asked   Other Topics Concern  . None   Social History Narrative   Past Surgical History  Procedure Laterality Date  . Cholecystectomy    . Appendectomy    . Abdominal hysterectomy    . Ovary surgery     Past Medical History  Diagnosis Date  . Hypertension   . Diabetes mellitus   . Degenerative disc disease   . Osteoporosis   . Hyperlipidemia   . IBS (irritable bowel syndrome)   . Fibromyalgia   . Arm pain     left   BP 110/75 mmHg  Pulse 69  Resp 16  SpO2 100%  Opioid Risk Score:   Fall Risk Score:  `1  Depression screen PHQ 2/9  Depression screen [facility] 2/9 03/20/2015 11/04/2014  Decreased Interest 0 0  Down, Depressed, Hopeless 0 0  PHQ - 2 Score 0 0  Altered sleeping - 3  Tired, decreased energy - 1  Change in appetite - 0  Feeling bad or failure about yourself  - 0  Trouble concentrating - 0  Moving slowly or fidgety/restless - 0  Suicidal thoughts - 0  PHQ-9 Score - 4     Review of Systems  Musculoskeletal: Positive for gait problem.  Neurological: Positive for weakness and numbness.       Tingling Spasms   Psychiatric/Behavioral: The patient is nervous/anxious.  All other systems reviewed and are negative.      Objective:   Physical Exam  Constitutional: She is oriented to person, place, and time. She appears well-developed and well-nourished.  HENT:  Head: Normocephalic and atraumatic.  Neck: Normal range of motion. Neck supple.  Cervical Paraspinal Tenderness: C-5-C-6  Cardiovascular: Normal rate and regular rhythm.   Pulmonary/Chest: Effort normal and breath sounds normal.  Musculoskeletal:  Normal Muscle Bulk and Muscle Testing Reveals: Upper Extremities: Full ROM and Muscle Strength 5/5 Thoracic Paraspinal Tenderness: T-6- T-8 Lumbar Paraspinal Tenderness: L-3-L-4 Lower Extremities: Full ROM and Muscle Strength 5/5 Arises from chair with  ease Narrow Based Gait  Neurological: She is alert and oriented to person, place, and time.  Skin: Skin is warm and dry.  Psychiatric: She has a normal mood and affect.  Nursing note and vitals reviewed.         Assessment & Plan:  1. Bilateral sacroiliac joint dysfunction:  Refilled: HYDROcodone 5/325mg  one tablet every 6 hrs as needed #90 and OPANA 10 mg one tablet every 12 hours #60. 2. Chronic pain syndrome consistent with fibromyalgia. Continue with exercise regime, heat therapy,voltarengel, lidocaine patches and trileptal. Continue current analgesics  3. Right lumbar radiculopathy:Continue with Medication Regime and Current Treatment Modalities and Exercise Regime.  4. Complex Regional Pain Syndrome: Continue Current Medication and Exercise Regime. Dr. Yolanda Bonine Following  5. Type 2 diabetes.: PMD Following   20 minutes of face to face patient care time was spent during this visit. All questions were encouraged and answered.

## 2015-05-25 ENCOUNTER — Other Ambulatory Visit: Payer: Self-pay | Admitting: Physical Medicine & Rehabilitation

## 2015-05-29 ENCOUNTER — Encounter: Payer: Medicare Other | Attending: Physical Medicine & Rehabilitation | Admitting: Registered Nurse

## 2015-05-29 ENCOUNTER — Encounter: Payer: Self-pay | Admitting: Registered Nurse

## 2015-05-29 VITALS — BP 136/84 | HR 96 | Resp 16

## 2015-05-29 DIAGNOSIS — M47817 Spondylosis without myelopathy or radiculopathy, lumbosacral region: Secondary | ICD-10-CM

## 2015-05-29 DIAGNOSIS — M609 Myositis, unspecified: Secondary | ICD-10-CM | POA: Diagnosis not present

## 2015-05-29 DIAGNOSIS — M5416 Radiculopathy, lumbar region: Secondary | ICD-10-CM | POA: Insufficient documentation

## 2015-05-29 DIAGNOSIS — M791 Myalgia: Secondary | ICD-10-CM | POA: Diagnosis not present

## 2015-05-29 DIAGNOSIS — Z79899 Other long term (current) drug therapy: Secondary | ICD-10-CM | POA: Diagnosis not present

## 2015-05-29 DIAGNOSIS — M461 Sacroiliitis, not elsewhere classified: Secondary | ICD-10-CM | POA: Diagnosis not present

## 2015-05-29 DIAGNOSIS — G9050 Complex regional pain syndrome I, unspecified: Secondary | ICD-10-CM | POA: Insufficient documentation

## 2015-05-29 DIAGNOSIS — Z5181 Encounter for therapeutic drug level monitoring: Secondary | ICD-10-CM | POA: Insufficient documentation

## 2015-05-29 DIAGNOSIS — G894 Chronic pain syndrome: Secondary | ICD-10-CM | POA: Diagnosis present

## 2015-05-29 DIAGNOSIS — IMO0001 Reserved for inherently not codable concepts without codable children: Secondary | ICD-10-CM

## 2015-05-29 DIAGNOSIS — M5010 Cervical disc disorder with radiculopathy, unspecified cervical region: Secondary | ICD-10-CM | POA: Insufficient documentation

## 2015-05-29 MED ORDER — MELOXICAM 15 MG PO TABS: ORAL_TABLET | ORAL | Status: DC

## 2015-05-29 MED ORDER — OXYMORPHONE HCL ER 10 MG PO T12A: 10.0000 mg | EXTENDED_RELEASE_TABLET | Freq: Two times a day (BID) | ORAL | Status: DC

## 2015-05-29 MED ORDER — HYDROCODONE-ACETAMINOPHEN 5-325 MG PO TABS: 1.0000 | ORAL_TABLET | Freq: Four times a day (QID) | ORAL | Status: DC | PRN

## 2015-05-29 NOTE — Progress Notes (Signed)
Subjective:    Patient ID: Shannon EaringSally French, female    DOB: 05/06/1966, 49 y.o.   MRN: [id number]  HPI: [name] is a 49 year old female who returns for follow up for chronic pain and medication refill. She says her pain is located in her neck,bilateral arms and hands and mid-lowback. She rates her pain 6. Her current exercise regime is walking and performing stretching exercises.   Pain Inventory Average Pain 5 Pain Right Now 6 My pain is constant, sharp, burning, dull, stabbing, tingling and aching  In the last 24 hours, has pain interfered with the following? General activity 6 Relation with others 6 Enjoyment of life 6 What TIME of day is your pain at its worst? Morning, Daytime, Evening and Night  Sleep (in general) Fair  Pain is worse with: walking, bending, sitting, inactivity, standing and some activites Pain improves with: rest, heat/ice, therapy/exercise, pacing activities and medication Relief from Meds: 5  Mobility walk without assistance walk with assistance use a cane how many minutes can you walk? 30 ability to climb steps?  yes do you drive?  yes transfers alone  Function disabled: date disabled 2003 I need assistance with the following:  meal prep, household duties and shopping Do you have any goals in this area?  yes  Neuro/Psych weakness numbness tingling trouble walking spasms anxiety  Prior Studies Any changes since last visit?  no  Physicians involved in your care Any changes since last visit?  yes   Family History  Problem Relation Age of Onset  . Cancer Father    Social History   Social History  . Marital Status: Single    Spouse Name: N/A  . Number of Children: N/A  . Years of Education: N/A   Social History Main Topics  . Smoking status: Former Smoker    Quit date: 10/14/2009  . Smokeless tobacco: Never Used  . Alcohol Use: No  . Drug Use: No  . Sexual Activity: Not Asked   Other Topics Concern  . None   Social  History Narrative   Past Surgical History  Procedure Laterality Date  . Cholecystectomy    . Appendectomy    . Abdominal hysterectomy    . Ovary surgery     Past Medical History  Diagnosis Date  . Hypertension   . Diabetes mellitus   . Degenerative disc disease   . Osteoporosis   . Hyperlipidemia   . IBS (irritable bowel syndrome)   . Fibromyalgia   . Arm pain     left   BP 136/84 mmHg  Pulse 96  Resp 16  SpO2 96%  Opioid Risk Score:   Fall Risk Score:  `1  Depression screen PHQ 2/9  [facility] 03/20/2015 11/04/2014  Decreased Interest 0 0  Down, Depressed, Hopeless 0 0  PHQ - 2 Score 0 0  Altered sleeping - 3  Tired, decreased energy - 1  Change in appetite - 0  Feeling bad or failure about yourself  - 0  Trouble concentrating - 0  Moving slowly or fidgety/restless - 0  Suicidal thoughts - 0  PHQ-9 Score - 4     Review of Systems  Musculoskeletal:       Spasms  Neurological: Positive for weakness and numbness.       Tingling Gait Instability  Psychiatric/Behavioral: The patient is nervous/anxious.   All other systems reviewed and are negative.      Objective:   Physical Exam  Constitutional: She is oriented to person, place, and time. She appears well-developed and well-nourished.  HENT:  Head: Normocephalic and atraumatic.  Neck: Normal range of motion. Neck supple.  Cardiovascular: Normal rate and regular rhythm.   Pulmonary/Chest: Effort normal and breath sounds normal.  Musculoskeletal:  Normal Muscle Bulk and Muscle Testing Reveals: Upper Extremities: Full ROM and Muscle Strength 5/5 Lumbar Paraspinal Tenderness: L-4- L-5 Lower Extremities: Full ROM and Muscle Strength 5/5 Arises from chair with ease Narrow Based gait  Neurological: She is alert and oriented to person, place, and time.  Skin: Skin is warm and dry.  Psychiatric: She has a normal mood and affect.  Nursing note and vitals reviewed.         Assessment &  Plan:  1. Bilateral sacroiliac joint dysfunction:  Refilled: HYDROcodone 5/325mg  one tablet every 6 hrs as needed #90 and OPANA 10 mg one tablet every 12 hours #60. 2. Chronic pain syndrome consistent with fibromyalgia. Continue with exercise regime, heat therapy,voltarengel, lidocaine patches and trileptal. Continue current analgesics  3. Right lumbar radiculopathy:Continue with Medication Regime and Current Treatment Modalities and Exercise Regime.  4. Complex Regional Pain Syndrome: Continue Current Medication and Exercise Regime. Dr. Yolanda Bonine Following  5. Type 2 diabetes.: PMD Following   20 minutes of face to face patient care time was spent during this visit. All questions were encouraged and answered.

## 2015-05-31 ENCOUNTER — Other Ambulatory Visit: Payer: Self-pay | Admitting: Physical Medicine & Rehabilitation

## 2015-06-11 ENCOUNTER — Other Ambulatory Visit: Payer: Self-pay | Admitting: Registered Nurse

## 2015-06-26 ENCOUNTER — Encounter: Payer: Self-pay | Admitting: Physical Medicine & Rehabilitation

## 2015-06-26 ENCOUNTER — Encounter: Payer: Medicare Other | Attending: Physical Medicine & Rehabilitation | Admitting: Physical Medicine & Rehabilitation

## 2015-06-26 VITALS — BP 140/73 | HR 83

## 2015-06-26 DIAGNOSIS — M5010 Cervical disc disorder with radiculopathy, unspecified cervical region: Secondary | ICD-10-CM | POA: Insufficient documentation

## 2015-06-26 DIAGNOSIS — M47817 Spondylosis without myelopathy or radiculopathy, lumbosacral region: Secondary | ICD-10-CM | POA: Diagnosis not present

## 2015-06-26 DIAGNOSIS — M461 Sacroiliitis, not elsewhere classified: Secondary | ICD-10-CM | POA: Insufficient documentation

## 2015-06-26 DIAGNOSIS — M5416 Radiculopathy, lumbar region: Secondary | ICD-10-CM | POA: Diagnosis not present

## 2015-06-26 DIAGNOSIS — IMO0002 Reserved for concepts with insufficient information to code with codable children: Secondary | ICD-10-CM

## 2015-06-26 DIAGNOSIS — Z5181 Encounter for therapeutic drug level monitoring: Secondary | ICD-10-CM | POA: Insufficient documentation

## 2015-06-26 DIAGNOSIS — M797 Fibromyalgia: Secondary | ICD-10-CM

## 2015-06-26 DIAGNOSIS — M791 Myalgia: Secondary | ICD-10-CM | POA: Insufficient documentation

## 2015-06-26 DIAGNOSIS — Z79899 Other long term (current) drug therapy: Secondary | ICD-10-CM | POA: Insufficient documentation

## 2015-06-26 DIAGNOSIS — G894 Chronic pain syndrome: Secondary | ICD-10-CM | POA: Diagnosis present

## 2015-06-26 DIAGNOSIS — M609 Myositis, unspecified: Secondary | ICD-10-CM | POA: Diagnosis not present

## 2015-06-26 DIAGNOSIS — G9050 Complex regional pain syndrome I, unspecified: Secondary | ICD-10-CM | POA: Insufficient documentation

## 2015-06-26 DIAGNOSIS — IMO0001 Reserved for inherently not codable concepts without codable children: Secondary | ICD-10-CM

## 2015-06-26 MED ORDER — HYDROCODONE-ACETAMINOPHEN 5-325 MG PO TABS: 1.0000 | ORAL_TABLET | Freq: Four times a day (QID) | ORAL | Status: DC | PRN

## 2015-06-26 MED ORDER — DULOXETINE HCL 30 MG PO CPEP: ORAL_CAPSULE | ORAL | Status: DC

## 2015-06-26 MED ORDER — OXYMORPHONE HCL ER 10 MG PO T12A: 10.0000 mg | EXTENDED_RELEASE_TABLET | Freq: Two times a day (BID) | ORAL | Status: DC

## 2015-06-26 MED ORDER — TIZANIDINE HCL 2 MG PO TABS: ORAL_TABLET | ORAL | Status: DC

## 2015-06-26 MED ORDER — DULOXETINE HCL 60 MG PO CPEP: ORAL_CAPSULE | ORAL | Status: DC

## 2015-06-26 NOTE — Patient Instructions (Signed)
PLEASE CALL ME WITH ANY PROBLEMS OR QUESTIONS (#[phone]). HAVE A HAPPY HOLIDAY SEASON!!!    

## 2015-06-26 NOTE — Progress Notes (Signed)
Subjective:    Patient ID: Shannon French, female    DOB: [birth year], 49 y.o.   MRN: [id number]  HPI   Shareece is here in follow up of her chronic pain. She complains of a bad weekend due to the cooler/windy weather. She has her "good and bad " days still. She is doing her stretches and exercises daily. She has found the pool very beneficial but is no longer using due to the cooler weather.       Pain Inventory Average Pain 5 Pain Right Now 3 My pain is constant, sharp, burning, dull, stabbing, tingling and aching  In the last 24 hours, has pain interfered with the following? General activity 3 Relation with others 4 Enjoyment of life 3 What TIME of day is your pain at its worst? n/a Sleep (in general) NA  Pain is worse with: walking, bending, sitting, inactivity, standing and some activites Pain improves with: rest, heat/ice, therapy/exercise, pacing activities and medication Relief from Meds: 3  Mobility walk without assistance walk with assistance use a cane how many minutes can you walk? 30 ability to climb steps?  yes do you drive?  yes transfers alone Do you have any goals in this area?  yes  Function disabled: date disabled 2003 I need assistance with the following:  meal prep, household duties and shopping Do you have any goals in this area?  yes  Neuro/Psych weakness numbness tingling trouble walking spasms anxiety  Prior Studies Any changes since last visit?  no  Physicians involved in your care Any changes since last visit?  no   Family History  Problem Relation Age of Onset  . Cancer Father    Social History   Social History  . Marital Status: Single    Spouse Name: N/A  . Number of Children: N/A  . Years of Education: N/A   Social History Main Topics  . Smoking status: Former Smoker    Quit date: 10/14/2009  . Smokeless tobacco: Never Used  . Alcohol Use: No  . Drug Use: No  . Sexual Activity: Not Asked   Other Topics Concern  .  None   Social History Narrative   Past Surgical History  Procedure Laterality Date  . Cholecystectomy    . Appendectomy    . Abdominal hysterectomy    . Ovary surgery     Past Medical History  Diagnosis Date  . Hypertension   . Diabetes mellitus   . Degenerative disc disease   . Osteoporosis   . Hyperlipidemia   . IBS (irritable bowel syndrome)   . Fibromyalgia   . Arm pain     left   BP 140/73 mmHg  Pulse 83  SpO2 99%  Opioid Risk Score:   Fall Risk Score:  `1  Depression screen PHQ 2/9  Depression screen [facility] 2/9 03/20/2015 11/04/2014  Decreased Interest 0 0  Down, Depressed, Hopeless 0 0  PHQ - 2 Score 0 0  Altered sleeping - 3  Tired, decreased energy - 1  Change in appetite - 0  Feeling bad or failure about yourself  - 0  Trouble concentrating - 0  Moving slowly or fidgety/restless - 0  Suicidal thoughts - 0  PHQ-9 Score - 4      Review of Systems  Musculoskeletal: Positive for gait problem.  Neurological: Positive for weakness and numbness.  Psychiatric/Behavioral: Positive for dysphoric mood.  All other systems reviewed and are negative.      Objective:   Physical  Exam  Constitutional: She is oriented to person, place, and time. She appears well-developed and well-nourished.  HENT:  Head: Normocephalic and atraumatic.  Eyes: Conjunctivae and EOM are normal. Pupils are equal, round, and reactive to light.  Neck: Normal range of motion. Neck supple.  Cardiovascular: Normal rate and regular rhythm.  Pulmonary/Chest: Effort normal and breath sounds normal.  Abdominal: Soft.  Musculoskeletal: normal finger ROM/fingers are a little cool. PSIS are both very sensitive. Compression test and FABER test positive for SIJ's. Has limitations in lumbar ROM in all planes. She walks slowly with WB gait.   SLR equivocal. Reflexes remain 2+ on both UE's. Cervical ROM is fair. LUE is less sensitive to touch in general and she uses left arm more spontaneously.    Neurological: She is alert and oriented to person, place, and time.  Reflex Scores:  Patellar reflexes are 1+ on the right side and 2+ on the left side.  Achilles reflexes are 1+ on the right side and 2+ on the left side. 3-4/5 right ADF, APF, KE, HF. 4+/5 on left   Psychiatric: She has a normal mood and affect. Her behavior is normal. Judgment and thought content normal. Cognition and memory are normal.  Assessment & Plan:   ASSESSMENT:  1. Bilateral sacroiliac joint dysfunction, status post radio  frequencies which have been efficacious. Pain has increased substantially again.  2. Chronic pain syndrome consistent with fibromyalgia.  3. ?Right lumbar radiculopathy  3. Type 2 diabetes.  4. CRPS 1? LUE---    PLAN:  1. Tizanidine for spasms and sleep. meloxicam prn for pain flares  2. Continue with desensitization exercises for LUE. Stay active.  3. Hydrocodone and opana were refilled again today. Also can consider nucynta as an alternative.  -consider followup SNB's with Dr. Yolanda BonineBertrand depending upon sx/course 4. Continue elavill 10-20mg  qhs for sleep/neuro pain---continue cymbalta at 90mg  5. Follow up with NP in about one month. 15 minutes of face to face patient care time were spent during this visit. All questions were encouraged and answered.

## 2015-07-26 ENCOUNTER — Encounter: Payer: Self-pay | Admitting: Registered Nurse

## 2015-07-26 ENCOUNTER — Other Ambulatory Visit: Payer: Self-pay | Admitting: Registered Nurse

## 2015-07-26 ENCOUNTER — Encounter: Payer: Medicare Other | Attending: Physical Medicine & Rehabilitation | Admitting: Registered Nurse

## 2015-07-26 VITALS — BP 115/60 | HR 70 | Resp 14

## 2015-07-26 DIAGNOSIS — G894 Chronic pain syndrome: Secondary | ICD-10-CM | POA: Insufficient documentation

## 2015-07-26 DIAGNOSIS — IMO0001 Reserved for inherently not codable concepts without codable children: Secondary | ICD-10-CM

## 2015-07-26 DIAGNOSIS — Z5181 Encounter for therapeutic drug level monitoring: Secondary | ICD-10-CM | POA: Diagnosis not present

## 2015-07-26 DIAGNOSIS — M47817 Spondylosis without myelopathy or radiculopathy, lumbosacral region: Secondary | ICD-10-CM | POA: Insufficient documentation

## 2015-07-26 DIAGNOSIS — M5416 Radiculopathy, lumbar region: Secondary | ICD-10-CM | POA: Diagnosis not present

## 2015-07-26 DIAGNOSIS — G9050 Complex regional pain syndrome I, unspecified: Secondary | ICD-10-CM | POA: Diagnosis not present

## 2015-07-26 DIAGNOSIS — M791 Myalgia: Secondary | ICD-10-CM | POA: Insufficient documentation

## 2015-07-26 DIAGNOSIS — M609 Myositis, unspecified: Secondary | ICD-10-CM | POA: Diagnosis not present

## 2015-07-26 DIAGNOSIS — M797 Fibromyalgia: Secondary | ICD-10-CM

## 2015-07-26 DIAGNOSIS — M5010 Cervical disc disorder with radiculopathy, unspecified cervical region: Secondary | ICD-10-CM | POA: Insufficient documentation

## 2015-07-26 DIAGNOSIS — Z79899 Other long term (current) drug therapy: Secondary | ICD-10-CM | POA: Diagnosis not present

## 2015-07-26 DIAGNOSIS — M461 Sacroiliitis, not elsewhere classified: Secondary | ICD-10-CM

## 2015-07-26 MED ORDER — HYDROCODONE-ACETAMINOPHEN 5-325 MG PO TABS: 1.0000 | ORAL_TABLET | Freq: Four times a day (QID) | ORAL | Status: DC | PRN

## 2015-07-26 MED ORDER — OXYMORPHONE HCL ER 10 MG PO T12A: 10.0000 mg | EXTENDED_RELEASE_TABLET | Freq: Two times a day (BID) | ORAL | Status: DC

## 2015-07-26 NOTE — Progress Notes (Signed)
Subjective:    Patient ID: Shannon French, female    DOB: May 05, 1966, 49 y.o.   MRN: [id number]  HPI: [name] is a 49 year old female who returns for follow up for chronic pain and medication refill. She says her pain is located in her neck, right hand and mid-back. She rates her pain 3. Her current exercise regime is walking and performing stretching exercises.   Pain Inventory Average Pain 5 Pain Right Now 3 My pain is constant, sharp, burning, dull, stabbing, tingling and aching  In the last 24 hours, has pain interfered with the following? General activity 3 Relation with others 3 Enjoyment of life 3 What TIME of day is your pain at its worst? all Sleep (in general) Fair  Pain is worse with: walking, bending, sitting, inactivity, standing and some activites Pain improves with: rest, heat/ice, therapy/exercise, pacing activities and medication Relief from Meds: 7  Mobility walk without assistance walk with assistance use a cane how many minutes can you walk? 30 ability to climb steps?  yes do you drive?  yes Do you have any goals in this area?  yes  Function disabled: date disabled 2003 I need assistance with the following:  meal prep, household duties and shopping Do you have any goals in this area?  yes  Neuro/Psych weakness numbness tingling trouble walking spasms anxiety  Prior Studies Any changes since last visit?  no  Physicians involved in your care Any changes since last visit?  no   Family History  Problem Relation Age of Onset  . Cancer Father    Social History   Social History  . Marital Status: Single    Spouse Name: N/A  . Number of Children: N/A  . Years of Education: N/A   Social History Main Topics  . Smoking status: Former Smoker    Quit date: 10/14/2009  . Smokeless tobacco: Never Used  . Alcohol Use: No  . Drug Use: No  . Sexual Activity: Not Asked   Other Topics Concern  . None   Social History Narrative    Past Surgical History  Procedure Laterality Date  . Cholecystectomy    . Appendectomy    . Abdominal hysterectomy    . Ovary surgery     Past Medical History  Diagnosis Date  . Hypertension   . Diabetes mellitus   . Degenerative disc disease   . Osteoporosis   . Hyperlipidemia   . IBS (irritable bowel syndrome)   . Fibromyalgia   . Arm pain     left   BP 115/60 mmHg  Pulse 70  Resp 14  SpO2 99%  Opioid Risk Score:   Fall Risk Score:  `1  Depression screen PHQ 2/9  Depression screen [facility] 2/9 03/20/2015 11/04/2014  Decreased Interest 0 0  Down, Depressed, Hopeless 0 0  PHQ - 2 Score 0 0  Altered sleeping - 3  Tired, decreased energy - 1  Change in appetite - 0  Feeling bad or failure about yourself  - 0  Trouble concentrating - 0  Moving slowly or fidgety/restless - 0  Suicidal thoughts - 0  PHQ-9 Score - 4     Review of Systems  Musculoskeletal: Positive for gait problem.  Neurological: Positive for weakness and numbness.  Psychiatric/Behavioral: The patient is nervous/anxious.   All other systems reviewed and are negative.      Objective:   Physical Exam  Constitutional: She is oriented to person, place, and time. She  appears well-developed and well-nourished.  HENT:  Head: Normocephalic and atraumatic.  Neck: Normal range of motion. Neck supple.  Cardiovascular: Normal rate and regular rhythm.   Pulmonary/Chest: Effort normal and breath sounds normal. No respiratory distress.  Musculoskeletal:  Normal Muscle Bulk and Muscle Testing Reveals: Upper Extremities: Full ROM and Muscle Strength 5/5 Thoracic Paraspinal Tenderness: T-1- T-3 T-7- T-9 Lumbar Paraspinal Tenderness: L-3- L-5 Lower Extremities: Full ROM and Muscle Strength 5/5 Arises from chair with ease Narrow Based Gait   Neurological: She is alert and oriented to person, place, and time.  Skin: Skin is warm and dry.  Psychiatric: She has a normal mood and affect.  Nursing note and vitals  reviewed.         Assessment & Plan:  1. Bilateral sacroiliac joint dysfunction:  Refilled: HYDROcodone 5/325mg  one tablet every 6 hrs as needed #90 and OPANA 10 mg one tablet every 12 hours #60. 2. Chronic pain syndrome consistent with fibromyalgia. Continue with exercise regime, heat therapy,voltaren gel, lidocaine patches and topamax. Continue current analgesics  3. Right lumbar radiculopathy:Continue with Medication Regime and Current Treatment Modalities and Exercise Regime.  4. Complex Regional Pain Syndrome: Continue Current Medication and Exercise Regime. Dr. Yolanda BonineBertrand Following  5. Type 2 diabetes.: PMD Following   20 minutes of face to face patient care time was spent during this visit. All questions were encouraged and answered.

## 2015-07-27 LAB — PMP ALCOHOL METABOLITE (ETG): ETGU: NEGATIVE ng/mL

## 2015-08-01 LAB — OPIATES/OPIOIDS (LC/MS-MS)
CODEINE URINE: NEGATIVE ng/mL (ref <50)
HYDROCODONE: 352 ng/mL (ref <50)
Hydromorphone: NEGATIVE ng/mL — AB (ref <50)
Morphine Urine: NEGATIVE ng/mL (ref <50)
NORHYDROCODONE, UR: 430 ng/mL (ref <50)
NOROXYCODONE, UR: NEGATIVE ng/mL (ref <50)
OXYMORPHONE, URINE: 3217 ng/mL (ref <50)
Oxycodone, ur: NEGATIVE ng/mL (ref <50)

## 2015-08-01 LAB — OXYCODONE, URINE (LC/MS-MS)
NOROXYCODONE, UR: NEGATIVE ng/mL (ref <50)
OXYCODONE, UR: NEGATIVE ng/mL (ref <50)
Oxymorphone: 3217 ng/mL (ref <50)

## 2015-08-02 LAB — PRESCRIPTION MONITORING PROFILE (SOLSTAS)
Amphetamine/Meth: NEGATIVE ng/mL
BENZODIAZEPINE SCREEN, URINE: NEGATIVE ng/mL
BUPRENORPHINE, URINE: NEGATIVE ng/mL
Barbiturate Screen, Urine: NEGATIVE ng/mL
CANNABINOID SCRN UR: NEGATIVE ng/mL
CARISOPRODOL, URINE: NEGATIVE ng/mL
CREATININE, URINE: 18.95 mg/dL — AB (ref >20.0)
Cocaine Metabolites: NEGATIVE ng/mL
ECSTASY: NEGATIVE ng/mL
FENTANYL URINE: NEGATIVE ng/mL
MEPERIDINE UR: NEGATIVE ng/mL
Methadone Screen, Urine: NEGATIVE ng/mL
NITRITES URINE, INITIAL: NEGATIVE ug/mL
PH URINE, INITIAL: 5.8 pH (ref 4.5–8.9)
Propoxyphene: NEGATIVE ng/mL
Tapentadol, urine: NEGATIVE ng/mL
Tramadol Scrn, Ur: NEGATIVE ng/mL
ZOLPIDEM, URINE: NEGATIVE ng/mL

## 2015-09-04 ENCOUNTER — Encounter: Payer: Medicare Other | Admitting: Registered Nurse

## 2015-09-05 ENCOUNTER — Encounter: Payer: Self-pay | Admitting: Registered Nurse

## 2015-09-05 ENCOUNTER — Encounter: Payer: Medicare Other | Attending: Physical Medicine & Rehabilitation | Admitting: Registered Nurse

## 2015-09-05 VITALS — BP 107/78 | HR 76 | Resp 16

## 2015-09-05 DIAGNOSIS — Z5181 Encounter for therapeutic drug level monitoring: Secondary | ICD-10-CM

## 2015-09-05 DIAGNOSIS — M5416 Radiculopathy, lumbar region: Secondary | ICD-10-CM | POA: Diagnosis not present

## 2015-09-05 DIAGNOSIS — IMO0002 Reserved for concepts with insufficient information to code with codable children: Secondary | ICD-10-CM

## 2015-09-05 DIAGNOSIS — Z79899 Other long term (current) drug therapy: Secondary | ICD-10-CM | POA: Diagnosis not present

## 2015-09-05 DIAGNOSIS — M461 Sacroiliitis, not elsewhere classified: Secondary | ICD-10-CM | POA: Diagnosis not present

## 2015-09-05 DIAGNOSIS — IMO0001 Reserved for inherently not codable concepts without codable children: Secondary | ICD-10-CM

## 2015-09-05 DIAGNOSIS — G894 Chronic pain syndrome: Secondary | ICD-10-CM | POA: Insufficient documentation

## 2015-09-05 DIAGNOSIS — M5010 Cervical disc disorder with radiculopathy, unspecified cervical region: Secondary | ICD-10-CM | POA: Insufficient documentation

## 2015-09-05 DIAGNOSIS — M609 Myositis, unspecified: Secondary | ICD-10-CM | POA: Diagnosis not present

## 2015-09-05 DIAGNOSIS — G9050 Complex regional pain syndrome I, unspecified: Secondary | ICD-10-CM | POA: Diagnosis not present

## 2015-09-05 DIAGNOSIS — M791 Myalgia: Secondary | ICD-10-CM | POA: Insufficient documentation

## 2015-09-05 DIAGNOSIS — M47817 Spondylosis without myelopathy or radiculopathy, lumbosacral region: Secondary | ICD-10-CM | POA: Diagnosis not present

## 2015-09-05 DIAGNOSIS — M797 Fibromyalgia: Secondary | ICD-10-CM

## 2015-09-05 DIAGNOSIS — M62830 Muscle spasm of back: Secondary | ICD-10-CM

## 2015-09-05 MED ORDER — AMITRIPTYLINE HCL 10 MG PO TABS: 10.0000 mg | ORAL_TABLET | Freq: Every day | ORAL | Status: DC

## 2015-09-05 MED ORDER — HYDROCODONE-ACETAMINOPHEN 5-325 MG PO TABS: 1.0000 | ORAL_TABLET | Freq: Four times a day (QID) | ORAL | Status: DC | PRN

## 2015-09-05 MED ORDER — MELOXICAM 15 MG PO TABS: ORAL_TABLET | ORAL | Status: DC

## 2015-09-05 MED ORDER — OXYMORPHONE HCL ER 10 MG PO T12A: 10.0000 mg | EXTENDED_RELEASE_TABLET | Freq: Two times a day (BID) | ORAL | Status: DC

## 2015-09-05 NOTE — Progress Notes (Signed)
Subjective:    Patient ID: Shannon French, female    DOB: 11/18/1965, 50 y.o.   MRN: [id number]  HPI: Shannon French is a 50 year old female who returns for follow up for chronic pain and medication refill. She says her pain is located in her neck, right finger numbness and mid and lowerback. She rates her pain 4. Her current exercise regime is attending [facility] three times a week and walking and performing stretching exercises.   Pain Inventory Average Pain 4 Pain Right Now 4 My pain is constant, sharp, burning, dull, stabbing, tingling and aching  In the last 24 hours, has pain interfered with the following? General activity 4 Relation with others 4 Enjoyment of life 4 What TIME of day is your pain at its worst? morning, daytime, evening, night Sleep (in general) Fair  Pain is worse with: walking, bending, sitting, inactivity, standing and some activites Pain improves with: rest, heat/ice, therapy/exercise, pacing activities and medication Relief from Meds: 7  Mobility walk without assistance use a cane how many minutes can you walk? 30 ability to climb steps?  yes do you drive?  yes transfers alone Do you have any goals in this area?  yes  Function disabled: date disabled 2003 I need assistance with the following:  meal prep, household duties and shopping Do you have any goals in this area?  yes  Neuro/Psych weakness numbness tingling trouble walking spasms depression anxiety  Prior Studies Any changes since last visit?  no  Physicians involved in your care Any changes since last visit?  no   Family History  Problem Relation Age of Onset  . Cancer Father    Social History   Social History  . Marital Status: Single    Spouse Name: N/A  . Number of Children: N/A  . Years of Education: N/A   Social History Main Topics  . Smoking status: Former Smoker    Quit date: 10/14/2009  . Smokeless tobacco: Never Used  . Alcohol Use: No    . Drug Use: No  . Sexual Activity: Not Asked   Other Topics Concern  . None   Social History Narrative   Past Surgical History  Procedure Laterality Date  . Cholecystectomy    . Appendectomy    . Abdominal hysterectomy    . Ovary surgery     Past Medical History  Diagnosis Date  . Hypertension   . Diabetes mellitus   . Degenerative disc disease   . Osteoporosis   . Hyperlipidemia   . IBS (irritable bowel syndrome)   . Fibromyalgia   . Arm pain     left   BP 107/78 mmHg  Pulse 76  Resp 16  SpO2 97%  Opioid Risk Score:   Fall Risk Score:  `1  Depression screen PHQ 2/9  Depression screen [facility] 2/9 03/20/2015 11/04/2014  Decreased Interest 0 0  Down, Depressed, Hopeless 0 0  PHQ - 2 Score 0 0  Altered sleeping - 3  Tired, decreased energy - 1  Change in appetite - 0  Feeling bad or failure about yourself  - 0  Trouble concentrating - 0  Moving slowly or fidgety/restless - 0  Suicidal thoughts - 0  PHQ-9 Score - 4     Review of Systems  Musculoskeletal: Positive for gait problem.  Neurological: Positive for weakness and numbness.       Tingling  Spasms   Psychiatric/Behavioral: Positive for dysphoric mood. The patient is  nervous/anxious.   All other systems reviewed and are negative.      Objective:   Physical Exam  Constitutional: She is oriented to person, place, and time. She appears well-developed and well-nourished.  HENT:  Head: Normocephalic and atraumatic.  Neck: Normal range of motion. Neck supple.  Cardiovascular: Normal rate and regular rhythm.   Pulmonary/Chest: Effort normal and breath sounds normal.  Musculoskeletal:  Normal Muscle Bulk and Muscle Testing Reveals: Upper Extremities: Full ROM and Muscle Strength 5/5 Thoracic Paraspinal Tenderness: T-7- T-9 Lumbar Paraspinal Tenderness: L-3- L-5 Lower Extremities: Full ROM and Muscle Strength 5/5 Arises from chair with ease Narrow Based gait    Neurological: She is alert and oriented  to person, place, and time.  Skin: Skin is warm and dry.  Psychiatric: She has a normal mood and affect.  Nursing note and vitals reviewed.         Assessment & Plan:  1. Bilateral sacroiliac joint dysfunction:  Refilled: HYDROcodone 5/325mg  one tablet every 6 hrs as needed #90 and OPANA 10 mg one tablet every 12 hours #60. 2. Chronic pain syndrome consistent with fibromyalgia. Continue with exercise regime, heat therapy,voltaren gel, lidocaine patches and topamax. Continue current analgesics  3. Right lumbar radiculopathy:Continue with Medication Regime and Current Treatment Modalities and Exercise Regime.  4. Complex Regional Pain Syndrome: Continue Current Medication and Exercise Regime. Dr. Yolanda Bonine Following  5. Type 2 diabetes.: PMD Following  6. Muscle Spasms: Continue Tizanidine 2 mg in the morning increase noon dose to 4 mg and continue 4 mg dose at bedtime. Instructed to call office to evaluate treatment regimen.  20 minutes of face to face patient care time was spent during this visit. All questions were encouraged and answered.

## 2015-09-27 ENCOUNTER — Other Ambulatory Visit: Payer: Self-pay | Admitting: Physical Medicine & Rehabilitation

## 2015-10-02 ENCOUNTER — Encounter: Payer: Self-pay | Admitting: Registered Nurse

## 2015-10-02 ENCOUNTER — Encounter: Payer: Medicare Other | Attending: Physical Medicine & Rehabilitation | Admitting: Registered Nurse

## 2015-10-02 VITALS — BP 129/77 | HR 79

## 2015-10-02 DIAGNOSIS — G9050 Complex regional pain syndrome I, unspecified: Secondary | ICD-10-CM | POA: Diagnosis not present

## 2015-10-02 DIAGNOSIS — Z79899 Other long term (current) drug therapy: Secondary | ICD-10-CM | POA: Insufficient documentation

## 2015-10-02 DIAGNOSIS — G894 Chronic pain syndrome: Secondary | ICD-10-CM | POA: Diagnosis present

## 2015-10-02 DIAGNOSIS — M797 Fibromyalgia: Secondary | ICD-10-CM

## 2015-10-02 DIAGNOSIS — M47817 Spondylosis without myelopathy or radiculopathy, lumbosacral region: Secondary | ICD-10-CM | POA: Insufficient documentation

## 2015-10-02 DIAGNOSIS — M5416 Radiculopathy, lumbar region: Secondary | ICD-10-CM | POA: Insufficient documentation

## 2015-10-02 DIAGNOSIS — M791 Myalgia: Secondary | ICD-10-CM | POA: Diagnosis not present

## 2015-10-02 DIAGNOSIS — M5010 Cervical disc disorder with radiculopathy, unspecified cervical region: Secondary | ICD-10-CM | POA: Diagnosis not present

## 2015-10-02 DIAGNOSIS — M461 Sacroiliitis, not elsewhere classified: Secondary | ICD-10-CM | POA: Insufficient documentation

## 2015-10-02 DIAGNOSIS — IMO0002 Reserved for concepts with insufficient information to code with codable children: Secondary | ICD-10-CM

## 2015-10-02 DIAGNOSIS — IMO0001 Reserved for inherently not codable concepts without codable children: Secondary | ICD-10-CM

## 2015-10-02 DIAGNOSIS — Z5181 Encounter for therapeutic drug level monitoring: Secondary | ICD-10-CM | POA: Insufficient documentation

## 2015-10-02 DIAGNOSIS — M609 Myositis, unspecified: Secondary | ICD-10-CM | POA: Diagnosis not present

## 2015-10-02 MED ORDER — DULOXETINE HCL 60 MG PO CPEP: ORAL_CAPSULE | ORAL | Status: DC

## 2015-10-02 MED ORDER — OXYMORPHONE HCL ER 10 MG PO T12A: 10.0000 mg | EXTENDED_RELEASE_TABLET | Freq: Two times a day (BID) | ORAL | Status: DC

## 2015-10-02 MED ORDER — HYDROCODONE-ACETAMINOPHEN 5-325 MG PO TABS: 1.0000 | ORAL_TABLET | Freq: Four times a day (QID) | ORAL | Status: DC | PRN

## 2015-10-02 MED ORDER — DULOXETINE HCL 30 MG PO CPEP: ORAL_CAPSULE | ORAL | Status: DC

## 2015-10-02 MED ORDER — TIZANIDINE HCL 4 MG PO TABS: 4.0000 mg | ORAL_TABLET | Freq: Three times a day (TID) | ORAL | Status: DC | PRN

## 2015-10-02 NOTE — Progress Notes (Signed)
Subjective:    Patient ID: Shannon French, female    DOB: 1966-07-27, 50 y.o.   MRN: [id number]  HPI: Shannon French is a 50 year old female who returns for follow up for chronic pain and medication refill. She states her pain is located in her right hand, right elbow and mid-lowerback. She rates her pain 5. Her current exercise regime is walking 3/4 mile daily, performing stretching exercises and attending relaxation exercises twice a week. Shannon French states she has a lump in her right arm pit, refuses assessment states she will follow up with her PCP.  Pain Inventory Average Pain 5 Pain Right Now 5 My pain is constant, sharp, burning, dull, stabbing, tingling and aching  In the last 24 hours, has pain interfered with the following? General activity 4 Relation with others 4 Enjoyment of life 4 What TIME of day is your pain at its worst? all Sleep (in general) Fair  Pain is worse with: walking, bending, sitting, inactivity, standing and some activites Pain improves with: rest, heat/ice, therapy/exercise, pacing activities and medication Relief from Meds: 6  Mobility walk without assistance use a cane how many minutes can you walk? 30 ability to climb steps?  yes do you drive?  yes  Function disabled: date disabled 2003 I need assistance with the following:  meal prep, household duties and shopping  Neuro/Psych weakness numbness tingling trouble walking spasms anxiety  Prior Studies Any changes since last visit?  no  Physicians involved in your care Any changes since last visit?  no   Family History  Problem Relation Age of Onset  . Cancer Father    Social History   Social History  . Marital Status: Single    Spouse Name: N/A  . Number of Children: N/A  . Years of Education: N/A   Social History Main Topics  . Smoking status: Former Smoker    Quit date: 10/14/2009  . Smokeless tobacco: Never Used  . Alcohol Use: No  . Drug Use: No  . Sexual  Activity: Not Asked   Other Topics Concern  . None   Social History Narrative   Past Surgical History  Procedure Laterality Date  . Cholecystectomy    . Appendectomy    . Abdominal hysterectomy    . Ovary surgery     Past Medical History  Diagnosis Date  . Hypertension   . Diabetes mellitus   . Degenerative disc disease   . Osteoporosis   . Hyperlipidemia   . IBS (irritable bowel syndrome)   . Fibromyalgia   . Arm pain     left   BP 129/77 mmHg  Pulse 79  SpO2 92%  Opioid Risk Score:   Fall Risk Score:  `1  Depression screen PHQ 2/9  Depression screen [facility] 2/9 03/20/2015 11/04/2014  Decreased Interest 0 0  Down, Depressed, Hopeless 0 0  PHQ - 2 Score 0 0  Altered sleeping - 3  Tired, decreased energy - 1  Change in appetite - 0  Feeling bad or failure about yourself  - 0  Trouble concentrating - 0  Moving slowly or fidgety/restless - 0  Suicidal thoughts - 0  PHQ-9 Score - 4     Review of Systems  All other systems reviewed and are negative.      Objective:   Physical Exam  Constitutional: She is oriented to person, place, and time. She appears well-developed and well-nourished.  HENT:  Head: Normocephalic and atraumatic.  Neck: Normal range  of motion. Neck supple.  Cardiovascular: Normal rate and regular rhythm.   Pulmonary/Chest: Effort normal and breath sounds normal.  Musculoskeletal:  Normal Muscle Bulk and Muscle Testing Reveals: Upper Extremities: Full ROM and Muscle Strength 5/5 Thoracic Paraspinal Tenderness: T-1- T-3  T-7- T-9 Lumbar Paraspinal Tenderness: L-3- L-5 Arises from chair with ease Narrow Based Gait  Neurological: She is alert and oriented to person, place, and time.  Skin: Skin is warm and dry.  Psychiatric: She has a normal mood and affect.  Nursing note and vitals reviewed.         Assessment & Plan:  1. Bilateral sacroiliac joint dysfunction:  Refilled: HYDROcodone 5/325mg  one tablet every 6 hrs as needed #90 and  OPANA 10 mg one tablet every 12 hours #60. 2. Chronic pain syndrome consistent with fibromyalgia. Continue with exercise regime, heat therapy,voltaren gel, lidocaine patches and topamax. Continue current analgesics  3. Right lumbar radiculopathy:Continue with Medication Regime and Current Treatment Modalities and Exercise Regime.  4. Complex Regional Pain Syndrome: Continue Current Medication and Exercise Regime. Dr. Yolanda Bonine Following  5. Type 2 diabetes.: PMD Following  6. Muscle Spasms: Continue Tizanidine 4 mg one tablet every 8 hours as needed for muscle spasms.  20 minutes of face to face patient care time was spent during this visit. All questions were encouraged and answered.

## 2015-10-24 DIAGNOSIS — E1142 Type 2 diabetes mellitus with diabetic polyneuropathy: Secondary | ICD-10-CM | POA: Insufficient documentation

## 2015-10-24 DIAGNOSIS — I10 Essential (primary) hypertension: Secondary | ICD-10-CM | POA: Insufficient documentation

## 2015-10-24 DIAGNOSIS — E785 Hyperlipidemia, unspecified: Secondary | ICD-10-CM | POA: Insufficient documentation

## 2015-11-01 ENCOUNTER — Encounter: Payer: Medicare Other | Attending: Physical Medicine & Rehabilitation | Admitting: Registered Nurse

## 2015-11-01 ENCOUNTER — Encounter: Payer: Self-pay | Admitting: Registered Nurse

## 2015-11-01 VITALS — BP 112/88 | HR 84 | Resp 14

## 2015-11-01 DIAGNOSIS — M797 Fibromyalgia: Secondary | ICD-10-CM

## 2015-11-01 DIAGNOSIS — G9050 Complex regional pain syndrome I, unspecified: Secondary | ICD-10-CM | POA: Diagnosis not present

## 2015-11-01 DIAGNOSIS — M62830 Muscle spasm of back: Secondary | ICD-10-CM

## 2015-11-01 DIAGNOSIS — M461 Sacroiliitis, not elsewhere classified: Secondary | ICD-10-CM | POA: Insufficient documentation

## 2015-11-01 DIAGNOSIS — M5416 Radiculopathy, lumbar region: Secondary | ICD-10-CM | POA: Insufficient documentation

## 2015-11-01 DIAGNOSIS — Z79899 Other long term (current) drug therapy: Secondary | ICD-10-CM | POA: Insufficient documentation

## 2015-11-01 DIAGNOSIS — IMO0002 Reserved for concepts with insufficient information to code with codable children: Secondary | ICD-10-CM

## 2015-11-01 DIAGNOSIS — G894 Chronic pain syndrome: Secondary | ICD-10-CM | POA: Insufficient documentation

## 2015-11-01 DIAGNOSIS — IMO0001 Reserved for inherently not codable concepts without codable children: Secondary | ICD-10-CM

## 2015-11-01 DIAGNOSIS — M791 Myalgia: Secondary | ICD-10-CM

## 2015-11-01 DIAGNOSIS — M609 Myositis, unspecified: Secondary | ICD-10-CM | POA: Diagnosis not present

## 2015-11-01 DIAGNOSIS — M5010 Cervical disc disorder with radiculopathy, unspecified cervical region: Secondary | ICD-10-CM | POA: Diagnosis not present

## 2015-11-01 DIAGNOSIS — M47817 Spondylosis without myelopathy or radiculopathy, lumbosacral region: Secondary | ICD-10-CM | POA: Diagnosis not present

## 2015-11-01 DIAGNOSIS — Z5181 Encounter for therapeutic drug level monitoring: Secondary | ICD-10-CM

## 2015-11-01 MED ORDER — HYDROCODONE-ACETAMINOPHEN 5-325 MG PO TABS: 1.0000 | ORAL_TABLET | Freq: Four times a day (QID) | ORAL | Status: DC | PRN

## 2015-11-01 MED ORDER — OXYMORPHONE HCL ER 10 MG PO T12A: 10.0000 mg | EXTENDED_RELEASE_TABLET | Freq: Two times a day (BID) | ORAL | Status: DC

## 2015-11-01 NOTE — Progress Notes (Signed)
Subjective:    Patient ID: Shannon EaringSally Dobies, female    DOB: 08/10/1965, 50 y.o.   MRN: [id number]  HPI: Shannon French is a [age] year old female who returns for follow up for chronic pain and medication refill. She states her pain is located in her neck and mid-lowerback. She rates her pain 4. Her current exercise regime is walking 3/4 mile daily, performing stretching exercises and attending relaxation exercises twice a week.  Pain Inventory Average Pain 6 Pain Right Now 4 My pain is constant, sharp, burning, dull, stabbing, tingling and aching  In the last 24 hours, has pain interfered with the following? General activity 4 Relation with others 4 Enjoyment of life 4 What TIME of day is your pain at its worst? all Sleep (in general) Fair  Pain is worse with: walking, bending, sitting, inactivity, standing and some activites Pain improves with: rest, heat/ice, therapy/exercise, pacing activities and medication Relief from Meds: 6  Mobility walk without assistance walk with assistance use a cane how many minutes can you walk? 35 ability to climb steps?  yes do you drive?  yes transfers alone Do you have any goals in this area?  yes  Function disabled: date disabled . I need assistance with the following:  meal prep, household duties and shopping Do you have any goals in this area?  yes  Neuro/Psych weakness numbness tingling trouble walking spasms anxiety  Prior Studies Any changes since last visit?  no  Physicians involved in your care Any changes since last visit?  no   Family History  Problem Relation Age of Onset  . Cancer Father    Social History   Social History  . Marital Status: Single    Spouse Name: N/A  . Number of Children: N/A  . Years of Education: N/A   Social History Main Topics  . Smoking status: Former Smoker    Quit date: 10/14/2009  . Smokeless tobacco: Never Used  . Alcohol Use: No  . Drug Use: No  . Sexual Activity: Not Asked    Other Topics Concern  . None   Social History Narrative   Past Surgical History  Procedure Laterality Date  . Cholecystectomy    . Appendectomy    . Abdominal hysterectomy    . Ovary surgery     Past Medical History  Diagnosis Date  . Hypertension   . Diabetes mellitus   . Degenerative disc disease   . Osteoporosis   . Hyperlipidemia   . IBS (irritable bowel syndrome)   . Fibromyalgia   . Arm pain     left   BP 112/88 mmHg  Pulse 84  Resp 14  SpO2 97%  Opioid Risk Score:   Fall Risk Score:  `1  Depression screen PHQ 2/9  [facility] 11/01/2015 03/20/2015 11/04/2014  Decreased Interest 0 0 0  Down, Depressed, Hopeless 0 0 0  PHQ - 2 Score 0 0 0  Altered sleeping 0 - 3  Tired, decreased energy 0 - 1  Change in appetite 0 - 0  Feeling bad or failure about yourself  0 - 0  Trouble concentrating 0 - 0  Moving slowly or fidgety/restless 0 - 0  Suicidal thoughts 0 - 0  PHQ-9 Score 0 - 4     Review of Systems  Cardiovascular: Positive for leg swelling.  Gastrointestinal: Positive for nausea, diarrhea and constipation.  Hematological: Bruises/bleeds easily.  All other systems reviewed and are negative.  Objective:   Physical Exam  Constitutional: She is oriented to person, place, and time. She appears well-developed and well-nourished.  HENT:  Head: Normocephalic and atraumatic.  Neck: Normal range of motion. Neck supple.  Cardiovascular: Normal rate and regular rhythm.   Pulmonary/Chest: Effort normal and breath sounds normal.  Musculoskeletal:  Normal Muscle Bulk and Muscle Testing Reveals: Upper Extremities: Full ROM and Muscle Strength 5/5 Thoracic Paraspinal Tenderness: T-7- T-9 Lumbar Paraspinal Tenderness: L-3- L-5 Lower Extremities: Full ROM and Muscle Strength 5/5 Arises from chair with ease Narrow Based Gait  Neurological: She is alert and oriented to person, place, and time.  Skin: Skin is warm and dry.  Psychiatric: She  has a normal mood and affect.  Nursing note and vitals reviewed.         Assessment & Plan:  1. Bilateral sacroiliac joint dysfunction:  Refilled: HYDROcodone 5/325mg  one tablet every 6 hrs as needed #90 and OPANA 10 mg one tablet every 12 hours #60. 2. Chronic pain syndrome consistent with fibromyalgia. Continue with exercise regime, heat therapy,voltaren gel, lidocaine patches and topamax. Continue current analgesics  3. Right lumbar radiculopathy:Continue with Medication Regime and Current Treatment Modalities and Exercise Regime.  4. Complex Regional Pain Syndrome: Continue Current Medication and Exercise Regime. Dr. Yolanda Bonine Following  5. Type 2 diabetes.: PMD Following  6. Muscle Spasms: Continue Tizanidine 4 mg one tablet every 8 hours as needed for muscle spasms.  20 minutes of face to face patient care time was spent during this visit. All questions were encouraged and answered.

## 2015-11-29 ENCOUNTER — Encounter: Payer: Self-pay | Admitting: Registered Nurse

## 2015-11-29 ENCOUNTER — Encounter: Payer: Medicare Other | Attending: Physical Medicine & Rehabilitation | Admitting: Registered Nurse

## 2015-11-29 VITALS — BP 129/82 | HR 74 | Resp 14

## 2015-11-29 DIAGNOSIS — G4700 Insomnia, unspecified: Secondary | ICD-10-CM

## 2015-11-29 DIAGNOSIS — M5010 Cervical disc disorder with radiculopathy, unspecified cervical region: Secondary | ICD-10-CM | POA: Diagnosis not present

## 2015-11-29 DIAGNOSIS — M461 Sacroiliitis, not elsewhere classified: Secondary | ICD-10-CM | POA: Diagnosis not present

## 2015-11-29 DIAGNOSIS — Z79899 Other long term (current) drug therapy: Secondary | ICD-10-CM | POA: Insufficient documentation

## 2015-11-29 DIAGNOSIS — G9050 Complex regional pain syndrome I, unspecified: Secondary | ICD-10-CM | POA: Insufficient documentation

## 2015-11-29 DIAGNOSIS — M47817 Spondylosis without myelopathy or radiculopathy, lumbosacral region: Secondary | ICD-10-CM | POA: Diagnosis not present

## 2015-11-29 DIAGNOSIS — M609 Myositis, unspecified: Secondary | ICD-10-CM | POA: Insufficient documentation

## 2015-11-29 DIAGNOSIS — M5416 Radiculopathy, lumbar region: Secondary | ICD-10-CM | POA: Diagnosis not present

## 2015-11-29 DIAGNOSIS — IMO0001 Reserved for inherently not codable concepts without codable children: Secondary | ICD-10-CM

## 2015-11-29 DIAGNOSIS — M791 Myalgia: Secondary | ICD-10-CM | POA: Diagnosis not present

## 2015-11-29 DIAGNOSIS — G894 Chronic pain syndrome: Secondary | ICD-10-CM | POA: Insufficient documentation

## 2015-11-29 DIAGNOSIS — M797 Fibromyalgia: Secondary | ICD-10-CM

## 2015-11-29 DIAGNOSIS — IMO0002 Reserved for concepts with insufficient information to code with codable children: Secondary | ICD-10-CM

## 2015-11-29 DIAGNOSIS — Z5181 Encounter for therapeutic drug level monitoring: Secondary | ICD-10-CM | POA: Insufficient documentation

## 2015-11-29 DIAGNOSIS — M62830 Muscle spasm of back: Secondary | ICD-10-CM

## 2015-11-29 MED ORDER — HYDROCODONE-ACETAMINOPHEN 5-325 MG PO TABS: 1.0000 | ORAL_TABLET | Freq: Four times a day (QID) | ORAL | Status: DC | PRN

## 2015-11-29 MED ORDER — OXYMORPHONE HCL ER 10 MG PO T12A: 10.0000 mg | EXTENDED_RELEASE_TABLET | Freq: Two times a day (BID) | ORAL | Status: DC

## 2015-11-29 MED ORDER — AMITRIPTYLINE HCL 10 MG PO TABS: 10.0000 mg | ORAL_TABLET | Freq: Every day | ORAL | Status: DC

## 2015-11-29 NOTE — Progress Notes (Signed)
Subjective:    Patient ID: Shannon French, female    DOB: 10/16/1965, 50 y.o.   MRN: [id number]  HPI: Shannon French is a 50 year old female who returns for follow up for chronic pain and medication refill. She states her pain is located in her neck and mid-lowerback. She rates her pain 4. Her current exercise regime is walking and performing stretching exercises.  Shannon French tearful regarding her daughter's health, emotional support given.  Pain Inventory Average Pain 6 Pain Right Now 4 My pain is constant, sharp, burning, dull, stabbing, tingling and aching  In the last 24 hours, has pain interfered with the following? General activity 4 Relation with others 4 Enjoyment of life 4 What TIME of day is your pain at its worst? all Sleep (in general) Fair  Pain is worse with: walking, bending, sitting, inactivity, standing and some activites Pain improves with: rest, heat/ice, therapy/exercise, pacing activities and medication Relief from Meds: 4  Mobility walk without assistance walk with assistance how many minutes can you walk? 35 ability to climb steps?  yes do you drive?  yes Do you have any goals in this area?  yes  Function disabled: date disabled 2003 I need assistance with the following:  meal prep, household duties and shopping  Neuro/Psych weakness numbness tingling trouble walking spasms  Prior Studies Any changes since last visit?  no  Physicians involved in your care Any changes since last visit?  no   Family History  Problem Relation Age of Onset  . Cancer Father    Social History   Social History  . Marital Status: Single    Spouse Name: N/A  . Number of Children: N/A  . Years of Education: N/A   Social History Main Topics  . Smoking status: Former Smoker    Quit date: 10/14/2009  . Smokeless tobacco: Never Used  . Alcohol Use: No  . Drug Use: No  . Sexual Activity: Not Asked   Other Topics Concern  . None   Social History  Narrative   Past Surgical History  Procedure Laterality Date  . Cholecystectomy    . Appendectomy    . Abdominal hysterectomy    . Ovary surgery     Past Medical History  Diagnosis Date  . Hypertension   . Diabetes mellitus   . Degenerative disc disease   . Osteoporosis   . Hyperlipidemia   . IBS (irritable bowel syndrome)   . Fibromyalgia   . Arm pain     left   BP 129/82 mmHg  Pulse 74  Resp 14  SpO2 98%  Opioid Risk Score:   Fall Risk Score:  `1  Depression screen PHQ 2/9  [facility] 11/01/2015 03/20/2015 11/04/2014  Decreased Interest 0 0 0  Down, Depressed, Hopeless 0 0 0  PHQ - 2 Score 0 0 0  Altered sleeping 0 - 3  Tired, decreased energy 0 - 1  Change in appetite 0 - 0  Feeling bad or failure about yourself  0 - 0  Trouble concentrating 0 - 0  Moving slowly or fidgety/restless 0 - 0  Suicidal thoughts 0 - 0  PHQ-9 Score 0 - 4      Review of Systems  All other systems reviewed and are negative.      Objective:   Physical Exam  Constitutional: She is oriented to person, place, and time. She appears well-developed and well-nourished.  HENT:  Head: Normocephalic and atraumatic.  Neck:  Normal range of motion. Neck supple.  Cardiovascular: Normal rate and regular rhythm.   Pulmonary/Chest: Effort normal and breath sounds normal.  Musculoskeletal:  Normal Muscle Bulk and Muscle Testing Reveals: Upper Extremities: Full ROM and Muscle Strength 5/5 Lumbar Paraspinal Tenderness: L-3- L-5 Lower Extremities: Full ROM and Muscle Strength 5/5 Arises from chair with ease Narrow Based gait  Neurological: She is alert and oriented to person, place, and time.  Skin: Skin is warm and dry.  Psychiatric: She has a normal mood and affect.  Nursing note and vitals reviewed.         Assessment & Plan:  1. Bilateral sacroiliac joint dysfunction:  Refilled: HYDROcodone 5/325mg  one tablet every 6 hrs as needed #90 and OPANA 10 mg one tablet every  12 hours #60. We will continue the opioid monitoring program, this consists of regular clinic visits, examinations, urine drug screen pill counts as well as use of [place] Controlled Substance reporting System. 2. Chronic pain syndrome consistent with fibromyalgia. Continue with exercise regime, heat therapy,voltaren gel, lidocaine patches and topamax. Continue current analgesics  3. Right lumbar radiculopathy:Continue Elavil.  4. Complex Regional Pain Syndrome: Continue Cymbalta  Dr. Yolanda Bonine Following  5. Type 2 diabetes.: PMD Following  6. Muscle Spasms: Continue Tizanidine 4 mg one tablet every 8 hours as needed for muscle spasms. 7. Insomnia: Continue Elavil  20 minutes of face to face patient care time was spent during this visit. All questions were encouraged and answered.

## 2015-12-27 ENCOUNTER — Encounter: Payer: Medicare Other | Attending: Physical Medicine & Rehabilitation | Admitting: Registered Nurse

## 2015-12-27 ENCOUNTER — Ambulatory Visit
Admission: RE | Admit: 2015-12-27 | Discharge: 2015-12-27 | Disposition: A | Discharge status: Home / Self Care | Payer: Medicare Other | Source: Ambulatory Visit | Attending: Registered Nurse | Admitting: Registered Nurse

## 2015-12-27 ENCOUNTER — Encounter: Payer: Self-pay | Admitting: Registered Nurse

## 2015-12-27 VITALS — BP 129/84 | HR 70 | Resp 16

## 2015-12-27 DIAGNOSIS — M47817 Spondylosis without myelopathy or radiculopathy, lumbosacral region: Secondary | ICD-10-CM | POA: Diagnosis not present

## 2015-12-27 DIAGNOSIS — G894 Chronic pain syndrome: Secondary | ICD-10-CM | POA: Insufficient documentation

## 2015-12-27 DIAGNOSIS — M5416 Radiculopathy, lumbar region: Secondary | ICD-10-CM

## 2015-12-27 DIAGNOSIS — M7061 Trochanteric bursitis, right hip: Secondary | ICD-10-CM

## 2015-12-27 DIAGNOSIS — M461 Sacroiliitis, not elsewhere classified: Secondary | ICD-10-CM

## 2015-12-27 DIAGNOSIS — IMO0001 Reserved for inherently not codable concepts without codable children: Secondary | ICD-10-CM

## 2015-12-27 DIAGNOSIS — M5010 Cervical disc disorder with radiculopathy, unspecified cervical region: Secondary | ICD-10-CM

## 2015-12-27 DIAGNOSIS — Z79899 Other long term (current) drug therapy: Secondary | ICD-10-CM

## 2015-12-27 DIAGNOSIS — Z5181 Encounter for therapeutic drug level monitoring: Secondary | ICD-10-CM

## 2015-12-27 DIAGNOSIS — IMO0002 Reserved for concepts with insufficient information to code with codable children: Secondary | ICD-10-CM

## 2015-12-27 DIAGNOSIS — M791 Myalgia: Secondary | ICD-10-CM | POA: Insufficient documentation

## 2015-12-27 DIAGNOSIS — M609 Myositis, unspecified: Secondary | ICD-10-CM | POA: Diagnosis not present

## 2015-12-27 DIAGNOSIS — G9050 Complex regional pain syndrome I, unspecified: Secondary | ICD-10-CM | POA: Diagnosis not present

## 2015-12-27 DIAGNOSIS — M7062 Trochanteric bursitis, left hip: Secondary | ICD-10-CM

## 2015-12-27 MED ORDER — HYDROCODONE-ACETAMINOPHEN 5-325 MG PO TABS: 1.0000 | ORAL_TABLET | Freq: Four times a day (QID) | ORAL | Status: DC | PRN

## 2015-12-27 MED ORDER — OXYMORPHONE HCL ER 10 MG PO T12A: 10.0000 mg | EXTENDED_RELEASE_TABLET | Freq: Two times a day (BID) | ORAL | Status: DC

## 2015-12-27 NOTE — Progress Notes (Signed)
Subjective:    Patient ID: Shannon French, female    DOB: 1965-12-21, 50 y.o.   MRN: [id number]  HPI: [name] is a 50 year old female who returns for follow up for chronic pain and medication refill. She states her pain is located in her neck, bilateral hands, and mid-lower back radiating into her bilateral lower extremities posteriorly. She rates her pain 7. Her current exercise regime is walking and performing stretching exercises.  Also states she has an appointment with Dr. Susann Givens regarding surgery on her right armpit abscess.  Pain Inventory Average Pain 6 Pain Right Now 7 My pain is constant, sharp, burning, dull, stabbing, tingling and aching  In the last 24 hours, has pain interfered with the following? General activity 7 Relation with others 7 Enjoyment of life 7 What TIME of day is your pain at its worst? All Sleep (in general) Fair  Pain is worse with: walking, bending, sitting, inactivity, standing and some activites Pain improves with: rest, heat/ice, therapy/exercise, pacing activities and medication Relief from Meds: 4  Mobility walk without assistance walk with assistance use a cane how many minutes can you walk? 35 min ability to climb steps?  yes do you drive?  yes needs help with transfers Do you have any goals in this area?  yes  Function disabled: date disabled 2003  Neuro/Psych weakness numbness tingling trouble walking spasms  Prior Studies Any changes since last visit?  no  Physicians involved in your care Any changes since last visit?  no Primary care Benito Mccreedy   Family History  Problem Relation Age of Onset  . Cancer Father    Social History   Social History  . Marital Status: Single    Spouse Name: N/A  . Number of Children: N/A  . Years of Education: N/A   Social History Main Topics  . Smoking status: Former Smoker    Quit date: 10/14/2009  . Smokeless tobacco: Never Used  . Alcohol Use: No  . Drug Use:  No  . Sexual Activity: Not Asked   Other Topics Concern  . None   Social History Narrative   Past Surgical History  Procedure Laterality Date  . Cholecystectomy    . Appendectomy    . Abdominal hysterectomy    . Ovary surgery     Past Medical History  Diagnosis Date  . Hypertension   . Diabetes mellitus   . Degenerative disc disease   . Osteoporosis   . Hyperlipidemia   . IBS (irritable bowel syndrome)   . Fibromyalgia   . Arm pain     left   BP 129/84 mmHg  Pulse 70  Resp 16  SpO2 100%  Opioid Risk Score:   Fall Risk Score:  `1  Depression screen PHQ 2/9  Depression screen [facility] 2/9 12/27/2015 11/01/2015 03/20/2015 11/04/2014  Decreased Interest 0 0 0 0  Down, Depressed, Hopeless 0 0 0 0  PHQ - 2 Score 0 0 0 0  Altered sleeping - 0 - 3  Tired, decreased energy - 0 - 1  Change in appetite - 0 - 0  Feeling bad or failure about yourself  - 0 - 0  Trouble concentrating - 0 - 0  Moving slowly or fidgety/restless - 0 - 0  Suicidal thoughts - 0 - 0  PHQ-9 Score - 0 - 4       Review of Systems  All other systems reviewed and are negative.      Objective:  Physical Exam  Constitutional: She is oriented to person, place, and time. She appears well-developed and well-nourished.  HENT:  Head: Normocephalic and atraumatic.  Neck: Normal range of motion. Neck supple.  Cardiovascular: Normal rate and regular rhythm.   Pulmonary/Chest: Effort normal and breath sounds normal.  Musculoskeletal:  Normal Muscle Bulk and Muscle Testing Reveals: Upper Extremities: Full ROM and Muscle Strength 5/5 Thoracic Paraspinal Tenderness: T-7- T-9 Lower Extremities: Full ROM and Muscle Strength 5/5 Arises from chair with ease Narrow Based Gait  Neurological: She is alert and oriented to person, place, and time.  Skin: Skin is warm and dry.  Psychiatric: She has a normal mood and affect.  Nursing note and vitals reviewed.         Assessment & Plan:  1. Bilateral  sacroiliac joint dysfunction:  Refilled: HYDROcodone 5/325mg  one tablet every 6 hrs as needed #90 and OPANA 10 mg one tablet every 12 hours #60. We will continue the opioid monitoring program, this consists of regular clinic visits, examinations, urine drug screen pill counts as well as use of [place]. 2. Chronic pain syndrome consistent with fibromyalgia. Continue with exercise regime, heat therapy,voltaren gel, lidocaine patches and topamax. Continue current analgesics  3. Right lumbar radiculopathy:Continue Elavil.  4. Complex Regional Pain Syndrome: Continue Cymbalta Dr. Yolanda BonineBertrand Following  5. Type 2 diabetes.: PMD Following  6. Muscle Spasms: Continue Tizanidine 4 mg one tablet every 8 hours as needed for muscle spasms. 7. Insomnia: Continue Elavil  20 minutes of face to face patient care time was spent during this visit. All questions were encouraged and answered.

## 2016-01-02 ENCOUNTER — Other Ambulatory Visit: Payer: Self-pay | Admitting: Family Medicine

## 2016-01-02 MED ORDER — MELOXICAM 15 MG PO TABS: ORAL_TABLET | ORAL | Status: DC

## 2016-01-03 ENCOUNTER — Telehealth: Payer: Self-pay | Admitting: Registered Nurse

## 2016-01-03 NOTE — Telephone Encounter (Signed)
Placed a call to [name], reviewed her X-ray, she verbalizes understanding.

## 2016-01-07 ENCOUNTER — Other Ambulatory Visit: Payer: Self-pay | Admitting: Registered Nurse

## 2016-01-08 ENCOUNTER — Other Ambulatory Visit: Payer: Self-pay | Admitting: Family Medicine

## 2016-01-08 MED ORDER — MELOXICAM 15 MG PO TABS: ORAL_TABLET | ORAL | Status: DC

## 2016-01-22 ENCOUNTER — Ambulatory Visit: Payer: Medicare Other | Admitting: Physical Medicine & Rehabilitation

## 2016-01-26 ENCOUNTER — Encounter: Payer: Self-pay | Admitting: Registered Nurse

## 2016-01-26 ENCOUNTER — Encounter: Payer: Medicare Other | Attending: Physical Medicine & Rehabilitation | Admitting: Registered Nurse

## 2016-01-26 VITALS — BP 135/92 | HR 86 | Resp 17

## 2016-01-26 DIAGNOSIS — M461 Sacroiliitis, not elsewhere classified: Secondary | ICD-10-CM | POA: Insufficient documentation

## 2016-01-26 DIAGNOSIS — M791 Myalgia: Secondary | ICD-10-CM | POA: Insufficient documentation

## 2016-01-26 DIAGNOSIS — M5010 Cervical disc disorder with radiculopathy, unspecified cervical region: Secondary | ICD-10-CM | POA: Insufficient documentation

## 2016-01-26 DIAGNOSIS — IMO0001 Reserved for inherently not codable concepts without codable children: Secondary | ICD-10-CM

## 2016-01-26 DIAGNOSIS — Z79899 Other long term (current) drug therapy: Secondary | ICD-10-CM | POA: Insufficient documentation

## 2016-01-26 DIAGNOSIS — M609 Myositis, unspecified: Secondary | ICD-10-CM | POA: Insufficient documentation

## 2016-01-26 DIAGNOSIS — M47817 Spondylosis without myelopathy or radiculopathy, lumbosacral region: Secondary | ICD-10-CM | POA: Insufficient documentation

## 2016-01-26 DIAGNOSIS — Z5181 Encounter for therapeutic drug level monitoring: Secondary | ICD-10-CM | POA: Diagnosis not present

## 2016-01-26 DIAGNOSIS — M5416 Radiculopathy, lumbar region: Secondary | ICD-10-CM | POA: Insufficient documentation

## 2016-01-26 DIAGNOSIS — G894 Chronic pain syndrome: Secondary | ICD-10-CM | POA: Insufficient documentation

## 2016-01-26 DIAGNOSIS — G9050 Complex regional pain syndrome I, unspecified: Secondary | ICD-10-CM | POA: Insufficient documentation

## 2016-01-26 DIAGNOSIS — IMO0002 Reserved for concepts with insufficient information to code with codable children: Secondary | ICD-10-CM

## 2016-01-26 MED ORDER — DULOXETINE HCL 30 MG PO CPEP: ORAL_CAPSULE | ORAL | Status: DC

## 2016-01-26 MED ORDER — HYDROCODONE-ACETAMINOPHEN 5-325 MG PO TABS: 1.0000 | ORAL_TABLET | Freq: Four times a day (QID) | ORAL | Status: DC | PRN

## 2016-01-26 MED ORDER — OXYMORPHONE HCL ER 10 MG PO T12A: 10.0000 mg | EXTENDED_RELEASE_TABLET | Freq: Two times a day (BID) | ORAL | Status: DC

## 2016-01-26 NOTE — Progress Notes (Signed)
Subjective:    Patient ID: Shannon French, female    DOB: 06/01/1966, 50 y.o.   MRN: [id number]  HPI: Shannon French is a 50 year old female who returns for follow up for chronic pain and medication refill. She states her pain is located in her neck and mid-lower back radiating into her bilateral lower extremities laterally. She rates her pain 4. Her current exercise regime is  using her treadmill for 30 minutes daily, walking and performing stretching exercises.  Also states she had right armpit abscess removed by  Dr. Susann GivensMoorehead Band-Aid intact. States her results were negative.  Also states she went to visit her sister in law in [place] on May 30,2017 she has stage 4 cancer, her sister in law had butterscotch candy in candy jar. Shannon French had placed butterscotch in her mouth, her sister in law states it was medicine. She spit the candy right out. She will be calling the office back to let us know what the medication was.  Shannon French had a UDS today. Instructed on the narcotic policy and to be aware and ask people questions before ingesting anything, she verbalizes understanding.  Pain Inventory Average Pain 6 Pain Right Now 4 My pain is constant, sharp, burning, dull, stabbing, tingling and aching  In the last 24 hours, has pain interfered with the following? General activity 5 Relation with others 5 Enjoyment of life 5 What TIME of day is your pain at its worst? constant Sleep (in general) Fair  Pain is worse with: walking, bending, sitting, inactivity, standing and some activites Pain improves with: rest, heat/ice, therapy/exercise, pacing activities and medication Relief from Meds: 5  Mobility walk without assistance walk with assistance use a cane how many minutes can you walk? 30 ability to climb steps?  yes do you drive?  yes Do you have any goals in this area?  yes  Function disabled: date disabled 2003 I need assistance with the following:  meal prep, household duties  and shopping Do you have any goals in this area?  yes  Neuro/Psych weakness numbness tingling trouble walking spasms  Prior Studies Any changes since last visit?  yes x-rays  Physicians involved in your care Any changes since last visit?  yes   Family History  Problem Relation Age of Onset  . Cancer Father    Social History   Social History  . Marital Status: Single    Spouse Name: N/A  . Number of Children: N/A  . Years of Education: N/A   Social History Main Topics  . Smoking status: Former Smoker    Quit date: 10/14/2009  . Smokeless tobacco: Never Used  . Alcohol Use: No  . Drug Use: No  . Sexual Activity: Not Asked   Other Topics Concern  . None   Social History Narrative   Past Surgical History  Procedure Laterality Date  . Cholecystectomy    . Appendectomy    . Abdominal hysterectomy    . Ovary surgery     Past Medical History  Diagnosis Date  . Hypertension   . Diabetes mellitus   . Degenerative disc disease   . Osteoporosis   . Hyperlipidemia   . IBS (irritable bowel syndrome)   . Fibromyalgia   . Arm pain     left   BP 135/92 mmHg  Pulse 86  Resp 17  SpO2 97%  Opioid Risk Score:   Fall Risk Score:  `1  Depression screen PHQ 2/9  Depression screen  [facility] 2/9 12/27/2015 11/01/2015 03/20/2015 11/04/2014  Decreased Interest 0 0 0 0  Down, Depressed, Hopeless 0 0 0 0  PHQ - 2 Score 0 0 0 0  Altered sleeping - 0 - 3  Tired, decreased energy - 0 - 1  Change in appetite - 0 - 0  Feeling bad or failure about yourself  - 0 - 0  Trouble concentrating - 0 - 0  Moving slowly or fidgety/restless - 0 - 0  Suicidal thoughts - 0 - 0  PHQ-9 Score - 0 - 4     Review of Systems  Musculoskeletal: Positive for gait problem.  Neurological: Positive for weakness and numbness.       Tingling Spasms   All other systems reviewed and are negative.      Objective:   Physical Exam  Constitutional: She is oriented to person, place, and time. She  appears well-developed and well-nourished.  HENT:  Head: Normocephalic and atraumatic.  Neck: Normal range of motion. Neck supple.  Cardiovascular: Normal rate and regular rhythm.   Pulmonary/Chest: Effort normal and breath sounds normal.  Musculoskeletal:  Normal Muscle Bulk and Muscle Testing Reveals: Upper Extremities: Full ROM and Muscle Strength 5/5 Thoracic Paraspinal Tenderness: T-1- T-3  T-7- T-9 Lumbar Paraspinal Tenderness: L-3- L-5 Lower Extremities: Full ROM and Muscle Strength 5/5 Arises from table with ease Narrow Based Gait  Neurological: She is alert and oriented to person, place, and time.  Skin: Skin is warm and dry.  Psychiatric: She has a normal mood and affect.  Nursing note and vitals reviewed.         Assessment & Plan:  1. Bilateral sacroiliac joint dysfunction:  Refilled: HYDROcodone 5/325mg  one tablet every 6 hrs as needed #90 and OPANA 10 mg one tablet every 12 hours #60. We will continue the opioid monitoring program, this consists of regular clinic visits, examinations, urine drug screen pill counts as well as use of [place] Cont[place] Substance reporting [place]. 2. Chronic pain syndrome consistent with fibromyalgia. Continue with exercise regime, heat therapy,voltaren gel, lidocaine patches and topamax. Continue current analgesics  3. Right lumbar radiculopathy:Continue Elavil.  4. Complex Regional Pain Syndrome: Continue Cymbalta Dr. Yolanda BonineBertrand Following  5. Type 2 diabetes.: PMD Following  6. Muscle Spasms: Continue Tizanidine 4 mg one tablet every 8 hours as needed for muscle spasms. 7. Insomnia: Continue Elavil  20 minutes of face to face patient care time was spent during this visit. All questions were encouraged and answered.

## 2016-02-05 ENCOUNTER — Other Ambulatory Visit: Payer: Self-pay | Admitting: *Deleted

## 2016-02-05 DIAGNOSIS — IMO0001 Reserved for inherently not codable concepts without codable children: Secondary | ICD-10-CM

## 2016-02-05 DIAGNOSIS — IMO0002 Reserved for concepts with insufficient information to code with codable children: Secondary | ICD-10-CM

## 2016-02-05 LAB — TOXASSURE SELECT,+ANTIDEPR,UR: PDF: 0

## 2016-02-05 MED ORDER — DULOXETINE HCL 30 MG PO CPEP: ORAL_CAPSULE | ORAL | Status: DC

## 2016-02-08 ENCOUNTER — Telehealth: Payer: Self-pay | Admitting: Registered Nurse

## 2016-02-08 NOTE — Telephone Encounter (Signed)
Spoke to [name], she states she had a red glass of  red wine on 01/26/2016. When she went to visit her sister-in law in [place] and had a piece of hard candy, her sister- in law told her it had medicine in it. [name] did tell me this on the day of her visit on 01/26/2016.  Her last occurrence of THC was 06/27/2014. We reviewed the Narcotic Policy, she is aware Dr. Caren HazySwarts has the final say she verbalizes understanding.

## 2016-02-08 NOTE — Telephone Encounter (Signed)
"  medicine in hard candy????? Really?????  Please issue a final warning. ANY discrepancies in future UDS's will mean immediate discharge.  No exceptions.

## 2016-02-09 NOTE — Telephone Encounter (Signed)
Placed a call to [name], no answer. Left message to return the call.

## 2016-02-12 ENCOUNTER — Telehealth: Payer: Self-pay | Admitting: Physical Medicine & Rehabilitation

## 2016-02-12 NOTE — Telephone Encounter (Signed)
Patient is returning Eunice's call.

## 2016-02-12 NOTE — Telephone Encounter (Signed)
Spoke with [name],  She realizes Dr. Riley KillSwartz has given her a final warning, if any discrepancies with UDS arise she will be discharge. Final Warning letter will be mailed.

## 2016-02-16 NOTE — Progress Notes (Signed)
Urine drug screen for this encounter is inconsistent. Positive for alcohol and THC.  This is second positive for THC. Final warning letter written.

## 2016-02-26 ENCOUNTER — Encounter: Payer: Self-pay | Admitting: Physical Medicine & Rehabilitation

## 2016-02-26 ENCOUNTER — Encounter: Payer: Medicare Other | Attending: Physical Medicine & Rehabilitation | Admitting: Physical Medicine & Rehabilitation

## 2016-02-26 VITALS — BP 124/79 | HR 85

## 2016-02-26 DIAGNOSIS — G894 Chronic pain syndrome: Secondary | ICD-10-CM | POA: Diagnosis present

## 2016-02-26 DIAGNOSIS — M47817 Spondylosis without myelopathy or radiculopathy, lumbosacral region: Secondary | ICD-10-CM | POA: Insufficient documentation

## 2016-02-26 DIAGNOSIS — Z79899 Other long term (current) drug therapy: Secondary | ICD-10-CM | POA: Insufficient documentation

## 2016-02-26 DIAGNOSIS — M5416 Radiculopathy, lumbar region: Secondary | ICD-10-CM | POA: Diagnosis not present

## 2016-02-26 DIAGNOSIS — M5010 Cervical disc disorder with radiculopathy, unspecified cervical region: Secondary | ICD-10-CM | POA: Diagnosis not present

## 2016-02-26 DIAGNOSIS — Z5181 Encounter for therapeutic drug level monitoring: Secondary | ICD-10-CM | POA: Insufficient documentation

## 2016-02-26 DIAGNOSIS — M609 Myositis, unspecified: Secondary | ICD-10-CM | POA: Diagnosis not present

## 2016-02-26 DIAGNOSIS — G9050 Complex regional pain syndrome I, unspecified: Secondary | ICD-10-CM | POA: Diagnosis not present

## 2016-02-26 DIAGNOSIS — M797 Fibromyalgia: Secondary | ICD-10-CM

## 2016-02-26 DIAGNOSIS — IMO0001 Reserved for inherently not codable concepts without codable children: Secondary | ICD-10-CM

## 2016-02-26 DIAGNOSIS — IMO0002 Reserved for concepts with insufficient information to code with codable children: Secondary | ICD-10-CM

## 2016-02-26 DIAGNOSIS — M791 Myalgia: Secondary | ICD-10-CM | POA: Diagnosis not present

## 2016-02-26 DIAGNOSIS — M461 Sacroiliitis, not elsewhere classified: Secondary | ICD-10-CM | POA: Insufficient documentation

## 2016-02-26 MED ORDER — AMITRIPTYLINE HCL 10 MG PO TABS: 10.0000 mg | ORAL_TABLET | Freq: Every day | ORAL | 2 refills | Status: DC

## 2016-02-26 MED ORDER — TIZANIDINE HCL 4 MG PO TABS: 4.0000 mg | ORAL_TABLET | Freq: Three times a day (TID) | ORAL | 3 refills | Status: DC | PRN

## 2016-02-26 MED ORDER — HYDROCODONE-ACETAMINOPHEN 5-325 MG PO TABS: 1.0000 | ORAL_TABLET | Freq: Four times a day (QID) | ORAL | 0 refills | Status: DC | PRN

## 2016-02-26 MED ORDER — OXYMORPHONE HCL ER 10 MG PO T12A: 10.0000 mg | EXTENDED_RELEASE_TABLET | Freq: Two times a day (BID) | ORAL | 0 refills | Status: DC

## 2016-02-26 NOTE — Progress Notes (Signed)
Subjective:    Patient ID: Shannon French, female    DOB: August 28, 1965, 50 y.o.   MRN: [id number]  HPI   Rickeya is here in follow up of her chronic pain. She has been more active at home. She swims twice a week. The exercise regimen has really helped her SIJ's. (She had been contemplating more injections prior to the work out program.)  She walks a mile+ 3x per week on top of her regular walking that she otherwise does.   She has been trying to lose more weight to help with her DM. She is now on medication to help with weight loss. She has lost 7lbs so far. Her sleep is improved. Spasms are less.   She feels that her pain medications are working appropriately.    Pain Inventory Average Pain 4 Pain Right Now 4 My pain is constant, sharp, burning, dull, stabbing, tingling and aching  In the last 24 hours, has pain interfered with the following? General activity 4 Relation with others 4 Enjoyment of life 4 What TIME of day is your pain at its worst? all times Sleep (in general) Fair  Pain is worse with: walking, bending, sitting, inactivity, standing, unsure and some activites Pain improves with: rest, heat/ice, therapy/exercise, pacing activities and medication Relief from Meds: 7  Mobility walk without assistance walk with assistance use a cane how many minutes can you walk? 1 hr ability to climb steps?  yes do you drive?  yes Do you have any goals in this area?  yes  Function disabled: date disabled 2003 I need assistance with the following:  meal prep, household duties and shopping Do you have any goals in this area?  yes  Neuro/Psych weakness numbness tingling trouble walking spasms  Prior Studies Any changes since last visit?  no  Physicians involved in your care Any changes since last visit?  no   Family History  Problem Relation Age of Onset  . Cancer Father    Social History   Social History  . Marital status: Single    Spouse name: N/A  . Number of  children: N/A  . Years of education: N/A   Social History Main Topics  . Smoking status: Former Smoker    Quit date: 10/14/2009  . Smokeless tobacco: Never Used  . Alcohol use No  . Drug use: No  . Sexual activity: Not Asked   Other Topics Concern  . None   Social History Narrative  . None   Past Surgical History:  Procedure Laterality Date  . ABDOMINAL HYSTERECTOMY    . APPENDECTOMY    . CHOLECYSTECTOMY    . OVARY SURGERY     Past Medical History:  Diagnosis Date  . Arm pain    left  . Degenerative disc disease   . Diabetes mellitus   . Fibromyalgia   . Hyperlipidemia   . Hypertension   . IBS (irritable bowel syndrome)   . Osteoporosis    BP 124/79 (BP Location: Right Arm)   Pulse 85   SpO2 97%   Opioid Risk Score:   Fall Risk Score:  `1  Depression screen PHQ 2/9  Depression screen [facility] 2/9 12/27/2015 11/01/2015 03/20/2015 11/04/2014  Decreased Interest 0 0 0 0  Down, Depressed, Hopeless 0 0 0 0  PHQ - 2 Score 0 0 0 0  Altered sleeping - 0 - 3  Tired, decreased energy - 0 - 1  Change in appetite - 0 - 0  Feeling bad  or failure about yourself  - 0 - 0  Trouble concentrating - 0 - 0  Moving slowly or fidgety/restless - 0 - 0  Suicidal thoughts - 0 - 0  PHQ-9 Score - 0 - 4    Review of Systems  Constitutional: Negative.   HENT: Negative.   Eyes: Negative.   Respiratory: Negative.   Cardiovascular: Negative.   Gastrointestinal: Negative.   Endocrine: Negative.   Genitourinary: Negative.   Musculoskeletal: Positive for back pain and neck pain.  Skin: Negative.   Allergic/Immunologic: Negative.   Neurological: Negative.   Hematological: Negative.   Psychiatric/Behavioral: Negative.        Objective:   Physical Exam   Constitutional: She is oriented to person, place, and time. She appears well-developed and well-nourished.  HENT:  Head: Normocephalic and atraumatic.  Eyes: Conjunctivae and EOM are normal. Pupils are equal, round, and reactive to  light.  Neck: Normal range of motion. Neck supple.  Cardiovascular: Normal rate and regular rhythm.  Pulmonary/Chest: Effort normal and breath sounds normal.  Abdominal: Soft.  Musculoskeletal: normal finger ROM/fingers are a little cool. PSIS are both very sensitive. Compression test and FABER test positive for SIJ's. Has limitations in lumbar ROM in all planes. She walks slowly with WB gait.   SLR equivocal. Reflexes remain 2+ on both UE's. Cervical ROM is fair. LUE is less sensitive to touch in general and she uses left arm more spontaneously.  Neurological: She is alert and oriented to person, place, and time.  Reflex Scores:  Patellar reflexes are 1+ on the right side and 2+ on the left side.  Achilles reflexes are 1+ on the right side and 2+ on the left side. 3-4/5 right ADF, APF, KE, HF. 4+/5 on left   Psychiatric: She has a normal mood and affect. Her behavior is normal. Judgment and thought content normal. Cognition and memory are normal.  Assessment & Plan:   ASSESSMENT:  1. Bilateral sacroiliac joint dysfunction, status post radio  frequencies which have been efficacious. Pain has increased substantially again.  2. Chronic pain syndrome consistent with fibromyalgia.  3. ?Right lumbar radiculopathy  3. Type 2 diabetes.  4. CRPS 1? LUE    PLAN:  1. Tizanidine for spasms and sleep. meloxicam prn for pain flares  2. Continue with desensitization exercises as she does. She's doing a great job right now.! 3. Hydrocodone and opana were refilled again today.   -will try to reduce opana to once daily to see if she can tolerate---entire dc is a possibliity 4. Consider followup SNB's with Dr. Yolanda Bonine depending upon sx/course  -also consider RF if symptoms dictate 5. Continue elavill 10-20mg  qhs for sleep/neuro pain---continue cymbalta at 90mg  6. Follow up with NP in about one month. 15 minutes of face to face patient care time were spent during this visit. All questions were  encouraged and answered. Discussed THC results. There will be no further exceptions in the future.

## 2016-02-26 NOTE — Patient Instructions (Signed)
REDUCE OPANA TO ONCE DAILY FOR 2 WEEKS. IF NO ISSUES YOU MAY DC AFTER TWO WEEKS

## 2016-03-28 ENCOUNTER — Ambulatory Visit: Payer: Medicare Other | Admitting: Registered Nurse

## 2016-04-09 ENCOUNTER — Other Ambulatory Visit: Payer: Self-pay | Admitting: *Deleted

## 2016-04-09 DIAGNOSIS — M797 Fibromyalgia: Secondary | ICD-10-CM

## 2016-04-09 MED ORDER — AMITRIPTYLINE HCL 10 MG PO TABS: 10.0000 mg | ORAL_TABLET | Freq: Every day | ORAL | 1 refills | Status: DC

## 2016-04-11 ENCOUNTER — Encounter: Payer: Medicare Other | Attending: Physical Medicine & Rehabilitation | Admitting: Registered Nurse

## 2016-04-11 ENCOUNTER — Encounter: Payer: Self-pay | Admitting: Registered Nurse

## 2016-04-11 VITALS — BP 112/78 | HR 89 | Resp 14

## 2016-04-11 DIAGNOSIS — G9050 Complex regional pain syndrome I, unspecified: Secondary | ICD-10-CM | POA: Diagnosis not present

## 2016-04-11 DIAGNOSIS — Z5181 Encounter for therapeutic drug level monitoring: Secondary | ICD-10-CM | POA: Diagnosis not present

## 2016-04-11 DIAGNOSIS — M5416 Radiculopathy, lumbar region: Secondary | ICD-10-CM | POA: Diagnosis not present

## 2016-04-11 DIAGNOSIS — M609 Myositis, unspecified: Secondary | ICD-10-CM | POA: Diagnosis not present

## 2016-04-11 DIAGNOSIS — M461 Sacroiliitis, not elsewhere classified: Secondary | ICD-10-CM | POA: Insufficient documentation

## 2016-04-11 DIAGNOSIS — M47817 Spondylosis without myelopathy or radiculopathy, lumbosacral region: Secondary | ICD-10-CM | POA: Diagnosis not present

## 2016-04-11 DIAGNOSIS — M791 Myalgia: Secondary | ICD-10-CM | POA: Diagnosis not present

## 2016-04-11 DIAGNOSIS — G894 Chronic pain syndrome: Secondary | ICD-10-CM | POA: Diagnosis present

## 2016-04-11 DIAGNOSIS — M797 Fibromyalgia: Secondary | ICD-10-CM

## 2016-04-11 DIAGNOSIS — M5010 Cervical disc disorder with radiculopathy, unspecified cervical region: Secondary | ICD-10-CM | POA: Diagnosis not present

## 2016-04-11 DIAGNOSIS — Z79899 Other long term (current) drug therapy: Secondary | ICD-10-CM | POA: Insufficient documentation

## 2016-04-11 DIAGNOSIS — IMO0001 Reserved for inherently not codable concepts without codable children: Secondary | ICD-10-CM

## 2016-04-11 DIAGNOSIS — IMO0002 Reserved for concepts with insufficient information to code with codable children: Secondary | ICD-10-CM

## 2016-04-11 MED ORDER — HYDROCODONE-ACETAMINOPHEN 5-325 MG PO TABS: 1.0000 | ORAL_TABLET | Freq: Four times a day (QID) | ORAL | 0 refills | Status: DC | PRN

## 2016-04-11 NOTE — Patient Instructions (Signed)
Call office On Monday  04/15/2016

## 2016-04-11 NOTE — Progress Notes (Signed)
Subjective:    Patient ID: Shannon French, female    DOB: 02/12/1966, 50 y.o.   MRN: [id number]  HPI:  [name] is a 50 year old female who returns for follow up for chronic pain and medication refill. She states her pain is located in her neck and mid-lower back. She rates her pain 3. Her current exercise regime is walking and performing stretching exercises and attending [employer] three days a week she's using the elliptical and treadmill  for 20 minutes. Last month Dr. Riley KillSwartz started the weaning process of the Opana, [name] states she tried it  for 10 days, she noticed increased intensity of pain in her lower back which impacted her activities for daily living. Re-iterated Opana is being pulled and we will most likely have to prescribe her Oxycodone. She states she would like to try again weaning herself off, we will increase hydrocodone to QID and Opana to daily dose. She was instructed to call office on Monday 04/15/2016 to evaluate. At this time [name] wasn't given a prescription. She verbalizes understanding.  Pain Inventory Average Pain 3 Pain Right Now 3 My pain is constant, sharp, burning, stabbing, tingling and aching  In the last 24 hours, has pain interfered with the following? General activity 6 Relation with others 6 Enjoyment of life 7 What TIME of day is your pain at its worst? all Sleep (in general) Good  Pain is worse with: walking, bending, sitting, inactivity, standing and some activites Pain improves with: rest, heat/ice, therapy/exercise, pacing activities and medication Relief from Meds: 7  Mobility walk without assistance walk with assistance how many minutes can you walk? 60 ability to climb steps?  yes do you drive?  yes Do you have any goals in this area?  yes  Function disabled: date disabled . I need assistance with the following:  meal prep, household duties and shopping Do you have any goals in this area?   yes  Neuro/Psych weakness numbness tingling spasms  Prior Studies Any changes since last visit?  no  Physicians involved in your care Any changes since last visit?  no   Family History  Problem Relation Age of Onset  . Cancer Father    Social History   Social History  . Marital status: Single    Spouse name: N/A  . Number of children: N/A  . Years of education: N/A   Social History Main Topics  . Smoking status: Former Smoker    Quit date: 10/14/2009  . Smokeless tobacco: Never Used  . Alcohol use No  . Drug use: No  . Sexual activity: Not Asked   Other Topics Concern  . None   Social History Narrative  . None   Past Surgical History:  Procedure Laterality Date  . ABDOMINAL HYSTERECTOMY    . APPENDECTOMY    . CHOLECYSTECTOMY    . OVARY SURGERY     Past Medical History:  Diagnosis Date  . Arm pain    left  . Degenerative disc disease   . Diabetes mellitus   . Fibromyalgia   . Hyperlipidemia   . Hypertension   . IBS (irritable bowel syndrome)   . Osteoporosis    BP 112/78 (BP Location: Right Arm, Patient Position: Sitting, Cuff Size: Normal)   Pulse 89   Resp 14   SpO2 96%   Opioid Risk Score:   Fall Risk Score:  `1  Depression screen PHQ 2/9  [facility] 2/[facility] 12/27/2015 11/01/2015  03/20/2015 11/04/2014  Decreased Interest 0 0 0 0  Down, Depressed, Hopeless 0 0 0 0  PHQ - 2 Score 0 0 0 0  Altered sleeping - 0 - 3  Tired, decreased energy - 0 - 1  Change in appetite - 0 - 0  Feeling bad or failure about yourself  - 0 - 0  Trouble concentrating - 0 - 0  Moving slowly or fidgety/restless - 0 - 0  Suicidal thoughts - 0 - 0  PHQ-9 Score - 0 - 4    Review of Systems  HENT: Negative.   Eyes: Negative.   Respiratory: Negative.   Cardiovascular: Negative.   Endocrine: Negative.   Musculoskeletal: Positive for back pain.       Spasms   Allergic/Immunologic: Negative.   Neurological: Positive for weakness and numbness.        Tingling   All other systems reviewed and are negative.      Objective:   Physical Exam  Constitutional: She is oriented to person, place, and time. She appears well-developed and well-nourished.  HENT:  Head: Normocephalic and atraumatic.  Neck: Normal range of motion. Neck supple.  Cardiovascular: Normal rate and regular rhythm.   Pulmonary/Chest: Effort normal and breath sounds normal.  Musculoskeletal:  Normal Muscle Bulk and Muscle Testing Reveals: Upper Extremities: Full ROM and Muscle Strength 5/5 Lumbar Paraspinal Tenderness: L-3- L-5 Lower Extremities: Full ROM and Muscle Strength 5/5 Arises from table with ease Narrow Based Gait  Neurological: She is alert and oriented to person, place, and time.  Skin: Skin is warm and dry.  Psychiatric: She has a normal mood and affect.  Nursing note and vitals reviewed.         Assessment & Plan:  1. Bilateral sacroiliac joint dysfunction:  Increased HYDROcodone 5/325mg  one tablet every 6 hrs as needed and Decreased OPANA 10 mg one tablet daily. Instructed to call office on Monday 04/15/2016 We will continue the opioid monitoring program, this consists of regular clinic visits, examinations, urine drug screen pill counts as well as use of [place] Controlled Substance reporting System. 2. Chronic pain syndrome consistent with fibromyalgia. Continue with exercise regime, heat therapy,voltaren gel, lidocaine patches and topamax. Continue current analgesics  3. Right lumbar radiculopathy:Continue Elavil.  4. Complex Regional Pain Syndrome: Continue Cymbalta Dr. Yolanda Bonine Following  5. Type 2 diabetes.: PMD Following  6. Muscle Spasms: Continue Tizanidine 4 mg one tablet every 8 hours as needed for muscle spasms. 7. Insomnia: Continue Elavil  20 minutes of face to face patient care time was spent during this visit. All questions were encouraged and answered.

## 2016-04-15 ENCOUNTER — Telehealth: Payer: Self-pay | Admitting: Registered Nurse

## 2016-04-15 DIAGNOSIS — M47817 Spondylosis without myelopathy or radiculopathy, lumbosacral region: Secondary | ICD-10-CM

## 2016-04-15 DIAGNOSIS — IMO0001 Reserved for inherently not codable concepts without codable children: Secondary | ICD-10-CM

## 2016-04-15 MED ORDER — HYDROCODONE-ACETAMINOPHEN 5-325 MG PO TABS: 1.0000 | ORAL_TABLET | Freq: Four times a day (QID) | ORAL | 0 refills | Status: DC | PRN

## 2016-04-15 MED ORDER — OXYMORPHONE HCL ER 10 MG PO T12A: 10.0000 mg | EXTENDED_RELEASE_TABLET | Freq: Every day | ORAL | 0 refills | Status: DC

## 2016-04-15 NOTE — Telephone Encounter (Signed)
[name] called office today, she states she hasn't taken her Opana since Friday and noticed increase intensity of pain. She was instructed to decrease Opana to every other dose as a slow wean. She wasn't interested in changed to a new analgesic. She was instructed to increase her Hydrocodone to QID .  She would like to continue the weaning of Opana, she currently has 14 tablets of Opana, she will be given a prescription for  daily dose. She verbalizes understanding.

## 2016-05-13 ENCOUNTER — Encounter: Payer: Self-pay | Admitting: Registered Nurse

## 2016-05-13 ENCOUNTER — Encounter: Payer: Medicare Other | Attending: Physical Medicine & Rehabilitation | Admitting: Registered Nurse

## 2016-05-13 VITALS — BP 109/77 | HR 84

## 2016-05-13 DIAGNOSIS — M791 Myalgia: Secondary | ICD-10-CM | POA: Insufficient documentation

## 2016-05-13 DIAGNOSIS — Z5181 Encounter for therapeutic drug level monitoring: Secondary | ICD-10-CM | POA: Insufficient documentation

## 2016-05-13 DIAGNOSIS — Z79899 Other long term (current) drug therapy: Secondary | ICD-10-CM | POA: Insufficient documentation

## 2016-05-13 DIAGNOSIS — M797 Fibromyalgia: Secondary | ICD-10-CM

## 2016-05-13 DIAGNOSIS — M5416 Radiculopathy, lumbar region: Secondary | ICD-10-CM | POA: Insufficient documentation

## 2016-05-13 DIAGNOSIS — M47817 Spondylosis without myelopathy or radiculopathy, lumbosacral region: Secondary | ICD-10-CM | POA: Insufficient documentation

## 2016-05-13 DIAGNOSIS — M609 Myositis, unspecified: Secondary | ICD-10-CM | POA: Insufficient documentation

## 2016-05-13 DIAGNOSIS — M461 Sacroiliitis, not elsewhere classified: Secondary | ICD-10-CM | POA: Diagnosis not present

## 2016-05-13 DIAGNOSIS — M5010 Cervical disc disorder with radiculopathy, unspecified cervical region: Secondary | ICD-10-CM | POA: Insufficient documentation

## 2016-05-13 DIAGNOSIS — G894 Chronic pain syndrome: Secondary | ICD-10-CM | POA: Diagnosis present

## 2016-05-13 DIAGNOSIS — G9050 Complex regional pain syndrome I, unspecified: Secondary | ICD-10-CM | POA: Insufficient documentation

## 2016-05-13 MED ORDER — OXYMORPHONE HCL ER 10 MG PO T12A: 10.0000 mg | EXTENDED_RELEASE_TABLET | Freq: Every day | ORAL | 0 refills | Status: DC

## 2016-05-13 MED ORDER — HYDROCODONE-ACETAMINOPHEN 5-325 MG PO TABS: 1.0000 | ORAL_TABLET | Freq: Four times a day (QID) | ORAL | 0 refills | Status: DC | PRN

## 2016-05-13 NOTE — Progress Notes (Signed)
Subjective:    Patient ID: Shannon EaringSally French, female    DOB: 02/03/1966, 50 y.o.   MRN: [id number]  HPI: [name] is a 50 year old female who returns for follow up for chronic pain and medication refill. She states her pain is located in her mid- back. She rates her pain 5. Her current exercise regime is walking and performing stretching exercises, attending [employer] three days a week she's using the elliptical and treadmill  for 20 minutes and [facility] two days a week. We will continue with the tapering of Opana to QOD, she has tolerated the daily dosage she states.  Also states she has lost 14 lbs, HGB A1C 6.4.   Pain Inventory Average Pain 5 Pain Right Now 5 My pain is constant, sharp, burning, dull, stabbing, tingling and aching  In the last 24 hours, has pain interfered with the following? General activity 7 Relation with others 7 Enjoyment of life 7 What TIME of day is your pain at its worst? all times Sleep (in general) Fair  Pain is worse with: walking, bending, sitting, inactivity, standing and some activites Pain improves with: rest, heat/ice, therapy/exercise, pacing activities and medication Relief from Meds: 7  Mobility walk without assistance how many minutes can you walk? 60 ability to climb steps?  yes do you drive?  yes Do you have any goals in this area?  yes  Function disabled: date disabled 2003 I need assistance with the following:  meal prep, household duties and shopping Do you have any goals in this area?  yes  Neuro/Psych numbness tingling spasms  Prior Studies Any changes since last visit?  no  Physicians involved in your care Any changes since last visit?  no   Family History  Problem Relation Age of Onset  . Cancer Father    Social History   Social History  . Marital status: Single    Spouse name: N/A  . Number of children: N/A  . Years of education: N/A   Social History Main Topics  . Smoking  status: Former Smoker    Quit date: 10/14/2009  . Smokeless tobacco: Never Used  . Alcohol use No  . Drug use: No  . Sexual activity: Not Asked   Other Topics Concern  . None   Social History Narrative  . None   Past Surgical History:  Procedure Laterality Date  . ABDOMINAL HYSTERECTOMY    . APPENDECTOMY    . CHOLECYSTECTOMY    . OVARY SURGERY     Past Medical History:  Diagnosis Date  . Arm pain    left  . Degenerative disc disease   . Diabetes mellitus   . Fibromyalgia   . Hyperlipidemia   . Hypertension   . IBS (irritable bowel syndrome)   . Osteoporosis    There were no vitals taken for this visit.  Opioid Risk Score:   Fall Risk Score:  `1  Depression screen PHQ 2/9  Dep[facility] screen [facility] 12/27/2015 11/01/2015 03/20/2015 11/04/2014  Decreased Interest 0 0 0 0  Down, Depressed, Hopeless 0 0 0 0  PHQ - 2 Score 0 0 0 0  Altered sleeping - 0 - 3  Tired, decreased energy - 0 - 1  Change in appetite - 0 - 0  Feeling bad or failure about yourself  - 0 - 0  Trouble concentrating - 0 - 0  Moving slowly or fidgety/restless - 0 - 0  Suicidal thoughts -  0 - 0  PHQ-9 Score - 0 - 4     Review of Systems  Constitutional: Negative.   HENT: Negative.   Eyes: Negative.   Respiratory: Negative.   Cardiovascular: Negative.   Gastrointestinal: Negative.   Endocrine: Negative.   Genitourinary: Negative.   Musculoskeletal: Positive for back pain.  Skin: Negative.   Allergic/Immunologic: Negative.   Neurological: Positive for numbness.  Hematological: Negative.   Psychiatric/Behavioral: Negative.        Objective:   Physical Exam  Constitutional: She is oriented to person, place, and time. She appears well-developed and well-nourished.  HENT:  Head: Normocephalic and atraumatic.  Neck: Normal range of motion. Neck supple.  Cardiovascular: Normal rate and regular rhythm.   Pulmonary/Chest: Effort normal and breath sounds normal.  Musculoskeletal:  Normal  Muscle Bulk and Muscle Testing Reveals: Upper Extremities: Full ROM and Muscle Strength 5/5 Thoracic Paraspinal Tenderness: T-7-T-9 Lumbar Paraspinal Tenderness: L-4- L-5 Lower Extremities: Full ROM and Muscle Strength 5/5 Arises from table with ease Normal Based Gait   Neurological: She is alert and oriented to person, place, and time.  Skin: Skin is warm and dry.  Psychiatric: She has a normal mood and affect.  Nursing note and vitals reviewed.         Assessment & Plan:  1. Bilateral sacroiliac joint dysfunction:  Refilled: HYDROcodone 5/325mg  one tablet every 6 hrs as needed # 120 and Decreased OPANA 10 mg one tablet QOD #20. We will continue the opioid monitoring program, this consists of regular clinic visits, examinations, urine drug screen pill counts as well as use of [place] Controlled Substance reporting System. 2. Chronic pain syndrome consistent with fibromyalgia. Continue with exercise regime, heat therapy,voltaren gel, lidocaine patches and topamax. Continue current analgesics  3. Right lumbar radiculopathy:Continue Elavil.  4. Complex Regional Pain Syndrome: Continue Cymbalta Dr. Yolanda Bonine Following  5. Type 2 diabetes.: PMD Following  6. Muscle Spasms: Continue Tizanidine 4 mg one tablet every 8 hours as needed for muscle spasms. 7. Insomnia: Continue Elavil  20 minutes of face to face patient care time was spent during this visit. All questions were encouraged and answered.

## 2016-05-19 LAB — TOXASSURE SELECT,+ANTIDEPR,UR

## 2016-05-20 NOTE — Progress Notes (Signed)
Urine drug screen for this encounter is consistent for prescribed medication 

## 2016-06-12 ENCOUNTER — Encounter: Payer: Self-pay | Admitting: Registered Nurse

## 2016-06-12 ENCOUNTER — Encounter: Payer: Medicare Other | Attending: Physical Medicine & Rehabilitation | Admitting: Registered Nurse

## 2016-06-12 ENCOUNTER — Encounter (INDEPENDENT_AMBULATORY_CARE_PROVIDER_SITE_OTHER): Payer: Self-pay

## 2016-06-12 VITALS — BP 113/80 | HR 81 | Resp 14

## 2016-06-12 DIAGNOSIS — M5416 Radiculopathy, lumbar region: Secondary | ICD-10-CM | POA: Insufficient documentation

## 2016-06-12 DIAGNOSIS — M797 Fibromyalgia: Secondary | ICD-10-CM | POA: Diagnosis not present

## 2016-06-12 DIAGNOSIS — Z79899 Other long term (current) drug therapy: Secondary | ICD-10-CM | POA: Diagnosis not present

## 2016-06-12 DIAGNOSIS — M7062 Trochanteric bursitis, left hip: Secondary | ICD-10-CM

## 2016-06-12 DIAGNOSIS — M47817 Spondylosis without myelopathy or radiculopathy, lumbosacral region: Secondary | ICD-10-CM | POA: Diagnosis not present

## 2016-06-12 DIAGNOSIS — M791 Myalgia: Secondary | ICD-10-CM | POA: Diagnosis not present

## 2016-06-12 DIAGNOSIS — Z5181 Encounter for therapeutic drug level monitoring: Secondary | ICD-10-CM

## 2016-06-12 DIAGNOSIS — M461 Sacroiliitis, not elsewhere classified: Secondary | ICD-10-CM | POA: Diagnosis not present

## 2016-06-12 DIAGNOSIS — M5010 Cervical disc disorder with radiculopathy, unspecified cervical region: Secondary | ICD-10-CM | POA: Diagnosis not present

## 2016-06-12 DIAGNOSIS — G894 Chronic pain syndrome: Secondary | ICD-10-CM

## 2016-06-12 DIAGNOSIS — M609 Myositis, unspecified: Secondary | ICD-10-CM | POA: Diagnosis not present

## 2016-06-12 DIAGNOSIS — M7061 Trochanteric bursitis, right hip: Secondary | ICD-10-CM

## 2016-06-12 DIAGNOSIS — G9050 Complex regional pain syndrome I, unspecified: Secondary | ICD-10-CM | POA: Diagnosis not present

## 2016-06-12 MED ORDER — HYDROCODONE-ACETAMINOPHEN 7.5-325 MG PO TABS: 1.0000 | ORAL_TABLET | Freq: Four times a day (QID) | ORAL | 0 refills | Status: DC | PRN

## 2016-06-12 MED ORDER — DULOXETINE HCL 30 MG PO CPEP: ORAL_CAPSULE | ORAL | 3 refills | Status: DC

## 2016-06-12 MED ORDER — TIZANIDINE HCL 4 MG PO TABS: 4.0000 mg | ORAL_TABLET | Freq: Three times a day (TID) | ORAL | 3 refills | Status: DC | PRN

## 2016-06-12 MED ORDER — OXYMORPHONE HCL ER 10 MG PO T12A: 10.0000 mg | EXTENDED_RELEASE_TABLET | Freq: Every day | ORAL | 0 refills | Status: DC

## 2016-06-12 MED ORDER — DULOXETINE HCL 60 MG PO CPEP: ORAL_CAPSULE | ORAL | 3 refills | Status: DC

## 2016-06-12 NOTE — Progress Notes (Signed)
Subjective:    Patient ID: Shannon French, female    DOB: 04/27/1966, 50 y.o.   MRN: [id number]  HPI: [name] is a 50 year old female who returns for follow up for chronic pain and medication refill. She states her pain is located in her mid- lower back and bilateral hips.Also states she has experienced increase intensity of mid-lower back pain with weaning of Opana. We will increase hydrocodone dose this month as we continue with the weaning of Opana. She verbalizes understanding. She rates her pain 6. Her current exercise regime is walking and performing stretching exercises, attending [employer] two days a week she's using the elliptical and treadmill for 20 minutes and [facility]. We will continue with the tapering of Opana to QOD.    Pain Inventory Average Pain 7 Pain Right Now 6 My pain is constant, sharp, burning, dull, stabbing, tingling and aching  In the last 24 hours, has pain interfered with the following? General activity 7 Relation with others 7 Enjoyment of life 7 What TIME of day is your pain at its worst? All Sleep (in general) Fair  Pain is worse with: walking, bending, sitting, inactivity, standing and some activites Pain improves with: rest, heat/ice, therapy/exercise, pacing activities and medication Relief from Meds: 4  Mobility walk without assistance walk with assistance use a cane how many minutes can you walk? 60 ability to climb steps?  yes do you drive?  yes transfers alone Do you have any goals in this area?  yes  Function disabled: date disabled 2008 I need assistance with the following:  meal prep, household duties and shopping Do you have any goals in this area?  yes  Neuro/Psych numbness tingling spasms  Prior Studies Any changes since last visit?  yes  Physicians involved in your care Any changes since last visit?  no   Family History  Problem Relation Age of Onset  . Cancer  Father    Social History   Social History  . Marital status: Single    Spouse name: N/A  . Number of children: N/A  . Years of education: N/A   Social History Main Topics  . Smoking status: Former Smoker    Quit date: 10/14/2009  . Smokeless tobacco: Never Used  . Alcohol use No  . Drug use: No  . Sexual activity: Not Asked   Other Topics Concern  . None   Social History Narrative  . None   Past Surgical History:  Procedure Laterality Date  . ABDOMINAL HYSTERECTOMY    . APPENDECTOMY    . CHOLECYSTECTOMY    . OVARY SURGERY     Past Medical History:  Diagnosis Date  . Arm pain    left  . Degenerative disc disease   . Diabetes mellitus   . Fibromyalgia   . Hyperlipidemia   . Hypertension   . IBS (irritable bowel syndrome)   . Osteoporosis    BP 113/80   Pulse 81   Resp 14   SpO2 98%   Opioid Risk Score:   Fall Risk Score:  `1  Depression screen PHQ 2/9  Dep[facility] screen [facility] 12/27/2015 11/01/2015 03/20/2015 11/04/2014  Decreased Interest 0 0 0 0  Down, Depressed, Hopeless 0 0 0 0  PHQ - 2 Score 0 0 0 0  Altered sleeping - 0 - 3  Tired, decreased energy - 0 - 1  Change in appetite - 0 - 0  Feeling bad or failure about yourself  - 0 - 0  Trouble concentrating - 0 - 0  Moving slowly or fidgety/restless - 0 - 0  Suicidal thoughts - 0 - 0  PHQ-9 Score - 0 - 4     Review of Systems  Gastrointestinal: Positive for diarrhea.  All other systems reviewed and are negative.      Objective:   Physical Exam  Constitutional: She is oriented to person, place, and time. She appears well-developed and well-nourished.  HENT:  Head: Normocephalic and atraumatic.  Neck: Normal range of motion. Neck supple.  Cervical Paraspinal Tenderness: C-5- C-6  Cardiovascular: Normal rate and regular rhythm.   Pulmonary/Chest: Effort normal and breath sounds normal.  Musculoskeletal:  Normal Muscle Bulk and Muscle Testing Reveals: Upper Extremities: Full ROM and Muscle  Strength 5/5 Thoracic Paraspinal Tenderness: T-1-T-3  T-7-T-9 Lumbar Paraspinal Tenderness: L-4- L-5 Bilateral Greater Trochanteric Tenderness Lower Extremities: Full ROM and Muscle Strength 5/5 Arises from Table with ease Narrow Based Gait    Neurological: She is alert and oriented to person, place, and time.  Skin: Skin is warm and dry.  Psychiatric: She has a normal mood and affect.  Nursing note and vitals reviewed.         Assessment & Plan:  1. Bilateral sacroiliac joint dysfunction:  Refilled: Increased:  HYDROcodone 7.5/325mg  one tablet every 6 hrs as needed # 120 and Continue with weaning ofOPANA 10 mg one tablet QOD #20. We will continue the opioid monitoring program, this consists of regular clinic visits, examinations, urine drug screen pill counts as well as use of [place] Cont[place] Substance reporting [place]. 2. Chronic pain syndrome consistent with fibromyalgia. Continue with exercise regime, heat therapy,voltaren gel, lidocaine patches and topamax. Continue current analgesics  3. Right lumbar radiculopathy:Continue Elavil.  4. Complex Regional Pain Syndrome: Continue Cymbalta Shannon French Following  5. Type 2 diabetes.: PMD Following  6. Muscle Spasms: Continue Tizanidine 4 mg one tablet every 8 hours as needed for muscle spasms. 7. Insomnia: Continue Elavil  20 minutes of face to face patient care time was spent during this visit. All questions were encouraged and answered.

## 2016-07-08 ENCOUNTER — Encounter: Payer: Self-pay | Admitting: Physical Medicine & Rehabilitation

## 2016-07-08 ENCOUNTER — Encounter: Payer: Medicare Other | Attending: Physical Medicine & Rehabilitation | Admitting: Physical Medicine & Rehabilitation

## 2016-07-08 VITALS — BP 121/82 | HR 75 | Resp 14

## 2016-07-08 DIAGNOSIS — G5642 Causalgia of left upper limb: Secondary | ICD-10-CM

## 2016-07-08 DIAGNOSIS — Z5181 Encounter for therapeutic drug level monitoring: Secondary | ICD-10-CM | POA: Insufficient documentation

## 2016-07-08 DIAGNOSIS — M609 Myositis, unspecified: Secondary | ICD-10-CM | POA: Diagnosis not present

## 2016-07-08 DIAGNOSIS — M5010 Cervical disc disorder with radiculopathy, unspecified cervical region: Secondary | ICD-10-CM

## 2016-07-08 DIAGNOSIS — M797 Fibromyalgia: Secondary | ICD-10-CM

## 2016-07-08 DIAGNOSIS — G894 Chronic pain syndrome: Secondary | ICD-10-CM | POA: Diagnosis present

## 2016-07-08 DIAGNOSIS — G9050 Complex regional pain syndrome I, unspecified: Secondary | ICD-10-CM | POA: Diagnosis not present

## 2016-07-08 DIAGNOSIS — M461 Sacroiliitis, not elsewhere classified: Secondary | ICD-10-CM | POA: Diagnosis not present

## 2016-07-08 DIAGNOSIS — M47817 Spondylosis without myelopathy or radiculopathy, lumbosacral region: Secondary | ICD-10-CM | POA: Diagnosis not present

## 2016-07-08 DIAGNOSIS — M5416 Radiculopathy, lumbar region: Secondary | ICD-10-CM | POA: Insufficient documentation

## 2016-07-08 DIAGNOSIS — M791 Myalgia: Secondary | ICD-10-CM | POA: Insufficient documentation

## 2016-07-08 DIAGNOSIS — Z79899 Other long term (current) drug therapy: Secondary | ICD-10-CM | POA: Diagnosis not present

## 2016-07-08 MED ORDER — HYDROCODONE-ACETAMINOPHEN 7.5-325 MG PO TABS: 1.0000 | ORAL_TABLET | Freq: Four times a day (QID) | ORAL | 0 refills | Status: DC | PRN

## 2016-07-08 NOTE — Progress Notes (Signed)
Subjective:    Patient ID: Shannon French, female    DOB: 01/08/1966, 50 y.o.   MRN: [id number]  HPI   Shannon RoundsSally is here in follow up of her chronic pain. She states that her fingers have been tingling more often over the last week. She notices some cramping in her hands too.   She has tried to cut back on the opana and has only used it once daily prn. She is using the hydrocodone four times daily which is working for her.   She has been working out 5 days per week. She has lowered her hgb A1C and has lost weight. Her bp is down.    Pain Inventory Average Pain 7 Pain Right Now 6 My pain is constant, sharp, burning, dull, stabbing, tingling and aching  In the last 24 hours, has pain interfered with the following? General activity 7 Relation with others 7 Enjoyment of life 7 What TIME of day is your pain at its worst? all Sleep (in general) Fair  Pain is worse with: walking, bending, sitting, inactivity, standing and some activites Pain improves with: rest, heat/ice, therapy/exercise, pacing activities and medication Relief from Meds: 4  Mobility walk without assistance walk with assistance use a cane how many minutes can you walk? 60 ability to climb steps?  yes do you drive?  yes transfers alone Do you have any goals in this area?  yes  Function disabled: date disabled . I need assistance with the following:  meal prep, household duties and shopping  Neuro/Psych numbness tingling spasms  Prior Studies Any changes since last visit?  no  Physicians involved in your care Any changes since last visit?  no   Family History  Problem Relation Age of Onset  . Cancer Father    Social History   Social History  . Marital status: Single    Spouse name: N/A  . Number of children: N/A  . Years of education: N/A   Social History Main Topics  . Smoking status: Former Smoker    Quit date: 10/14/2009  . Smokeless tobacco: Never Used  . Alcohol use No  . Drug use: No    . Sexual activity: Not Asked   Other Topics Concern  . None   Social History Narrative  . None   Past Surgical History:  Procedure Laterality Date  . ABDOMINAL HYSTERECTOMY    . APPENDECTOMY    . CHOLECYSTECTOMY    . OVARY SURGERY     Past Medical History:  Diagnosis Date  . Arm pain    left  . Degenerative disc disease   . Diabetes mellitus   . Fibromyalgia   . Hyperlipidemia   . Hypertension   . IBS (irritable bowel syndrome)   . Osteoporosis    BP 121/82 (BP Location: Left Arm, Patient Position: Sitting, Cuff Size: Large)   Pulse 75   Resp 14   SpO2 98%   Opioid Risk Score:   Fall Risk Score:  `1  Depression screen PHQ 2/9  Depression screen [facility] 2/9 12/27/2015 11/01/2015 03/20/2015 11/04/2014  Decreased Interest 0 0 0 0  Down, Depressed, Hopeless 0 0 0 0  PHQ - 2 Score 0 0 0 0  Altered sleeping - 0 - 3  Tired, decreased energy - 0 - 1  Change in appetite - 0 - 0  Feeling bad or failure about yourself  - 0 - 0  Trouble concentrating - 0 - 0  Moving slowly or fidgety/restless - 0 -  0  Suicidal thoughts - 0 - 0  PHQ-9 Score - 0 - 4    Review of Systems  Constitutional: Negative.   HENT: Negative.   Eyes: Negative.   Respiratory: Negative.   Cardiovascular: Negative.   Gastrointestinal: Negative.   Endocrine: Negative.   Musculoskeletal: Positive for back pain.        Spasms   Skin: Negative.   Allergic/Immunologic: Negative.   Neurological: Positive for numbness.       Tingling  Hematological: Negative.   Psychiatric/Behavioral: Negative.   All other systems reviewed and are negative.      Objective:   Physical Exam  Constitutional: She is oriented to person, place, and time. She appears well-developed and well-nourished.  HENT: PERRL Head: Normocephalic and atraumatic.  Eyes: PERRL.  Neck: Normal range of motion. Neck supple.  Cardiovascular: RRR.  Pulmonary/Chest: CTA  Abdominal: Soft.  Musculoskeletal: normal finger ROM/fingers are a  little cool. Mild low back tenderness along the L4-S1 levels with some spasm. Much improved ROM.  Neurological: She is alert and oriented to person, place, and time.  Reflex Scores:  Patellar reflexes are 1+ on the right side and 2+ on the left side.  Achilles reflexes are 1+ on the right side and 2+ on the left side. 4/5 right ADF, APF, KE, HF. 4+/5 on left. UE motor normal as is sensation today. UE DTR's are 1+.    Psychiatric: She has a normal mood and affect. Her behavior is normal. Judgment and thought content normal. Cognition and memory are normal.  Assessment & Plan:   ASSESSMENT:  1. Bilateral sacroiliac joint dysfunction, status post radio  frequencies which have been efficacious. Pain has increased substantially again.  2. Chronic pain syndrome consistent with fibromyalgia.  3. ?Right lumbar radiculopathy  3. Type 2 diabetes.  4. CRPS 1? LUE    PLAN:  1. Tizanidine for spasms and sleep. meloxicam prn for pain flares  2. Continue with desensitization exercises as she does. Very proud of her progress!  -[facility] to potentially address Pilates based principles 3. Hydrocodone was refilled. Do not want to increase the dosing.               -weaning opana to off. No rx needed today.   4. Consider followup SNB's at some point.              -also consider RF if symptoms dictate 5. Continue elavill 10-20mg  qhs for sleep/neuro pain---continue cymbalta at 90mg   -continues on topamax as well. This could be contributing to paresthesias given her weight loss.---consider trokendi trial 6. Follow up with NP in about one month. 15 minutes of face to face patient care time were spent during this visit. All questions were encouraged and answered.

## 2016-07-08 NOTE — Patient Instructions (Signed)
PLEASE CALL ME WITH ANY PROBLEMS OR QUESTIONS ([phone])   HAPPY HOLIDAYS!!!!                    *                * *             *   *   *         *  *   *  *  *     *  *  *  *  *  *  * *  *  *  *  *  *  *  *  *  * *               *  *               *  *               *  *  

## 2016-08-12 ENCOUNTER — Encounter: Payer: Self-pay | Admitting: Registered Nurse

## 2016-08-12 ENCOUNTER — Encounter: Payer: Medicare Other | Attending: Physical Medicine & Rehabilitation | Admitting: Registered Nurse

## 2016-08-12 VITALS — BP 114/80 | HR 89 | Resp 14

## 2016-08-12 DIAGNOSIS — M461 Sacroiliitis, not elsewhere classified: Secondary | ICD-10-CM | POA: Insufficient documentation

## 2016-08-12 DIAGNOSIS — M791 Myalgia: Secondary | ICD-10-CM | POA: Diagnosis not present

## 2016-08-12 DIAGNOSIS — M5416 Radiculopathy, lumbar region: Secondary | ICD-10-CM | POA: Diagnosis not present

## 2016-08-12 DIAGNOSIS — M609 Myositis, unspecified: Secondary | ICD-10-CM | POA: Insufficient documentation

## 2016-08-12 DIAGNOSIS — G894 Chronic pain syndrome: Secondary | ICD-10-CM

## 2016-08-12 DIAGNOSIS — G5642 Causalgia of left upper limb: Secondary | ICD-10-CM | POA: Diagnosis not present

## 2016-08-12 DIAGNOSIS — Z5181 Encounter for therapeutic drug level monitoring: Secondary | ICD-10-CM

## 2016-08-12 DIAGNOSIS — G9050 Complex regional pain syndrome I, unspecified: Secondary | ICD-10-CM | POA: Diagnosis not present

## 2016-08-12 DIAGNOSIS — Z79899 Other long term (current) drug therapy: Secondary | ICD-10-CM | POA: Diagnosis not present

## 2016-08-12 DIAGNOSIS — M5010 Cervical disc disorder with radiculopathy, unspecified cervical region: Secondary | ICD-10-CM | POA: Diagnosis not present

## 2016-08-12 DIAGNOSIS — M47817 Spondylosis without myelopathy or radiculopathy, lumbosacral region: Secondary | ICD-10-CM | POA: Insufficient documentation

## 2016-08-12 MED ORDER — HYDROCODONE-ACETAMINOPHEN 7.5-325 MG PO TABS: 1.0000 | ORAL_TABLET | Freq: Four times a day (QID) | ORAL | 0 refills | Status: DC | PRN

## 2016-08-12 NOTE — Progress Notes (Signed)
Subjective:    Patient ID: Shannon French, female    DOB: 04/24/1966, 51 y.o.   MRN: [id number]  HPI: HPI: [name] is a 51 year old female who returns for follow up appointment for chronic pain and medication refill. She states her pain is located in her mid- back.She rates her pain 5. Her current exercise regime is walking and performing stretching exercises,attending [employer] three days a week  and [facility] a [facility].  She has tolerated the weaning of the Opana, Opana has been discontinued. She verbalizes understanding.  Pain Inventory Average Pain 6 Pain Right Now 5 My pain is constant, sharp, burning, dull, stabbing, tingling and aching  In the last 24 hours, has pain interfered with the following? General activity 6 Relation with others 5 Enjoyment of life 6 What TIME of day is your pain at its worst? all Sleep (in general) Fair  Pain is worse with: walking, bending, sitting, inactivity, standing and some activites Pain improves with: rest, heat/ice, therapy/exercise, pacing activities and medication Relief from Meds: 5  Mobility walk without assistance how many minutes can you walk? 60 ability to climb steps?  yes do you drive?  yes  Function disabled: date disabled 2003 I need assistance with the following:  meal prep, household duties and shopping  Neuro/Psych weakness numbness tingling trouble walking spasms  Prior Studies Any changes since last visit?  no  Physicians involved in your care Any changes since last visit?  no   Family History  Problem Relation Age of Onset  . Cancer Father    Social History   Social History  . Marital status: Single    Spouse name: N/A  . Number of children: N/A  . Years of education: N/A   Social History Main Topics  . Smoking status: Former Smoker    Quit date: 10/14/2009  . Smokeless tobacco: Never Used  . Alcohol use No  . Drug use: No  . Sexual activity: Not Asked    Other Topics Concern  . None   Social History Narrative  . None   Past Surgical History:  Procedure Laterality Date  . ABDOMINAL HYSTERECTOMY    . APPENDECTOMY    . CHOLECYSTECTOMY    . OVARY SURGERY     Past Medical History:  Diagnosis Date  . Arm pain    left  . Degenerative disc disease   . Diabetes mellitus   . Fibromyalgia   . Hyperlipidemia   . Hypertension   . IBS (irritable bowel syndrome)   . Osteoporosis    BP 114/80   Pulse 89   Resp 14   SpO2 97%   Opioid Risk Score:   Fall Risk Score:  `1  Depression screen PHQ 2/9  Dep[facility] screen [facility] 2/[facility] 08/12/2016 12/27/2015 11/01/2015 03/20/2015 11/04/2014  Decreased Interest 0 0 0 0 0  Down, Depressed, Hopeless 0 0 0 0 0  PHQ - 2 Score 0 0 0 0 0  Altered sleeping - - 0 - 3  Tired, decreased energy - - 0 - 1  Change in appetite - - 0 - 0  Feeling bad or failure about yourself  - - 0 - 0  Trouble concentrating - - 0 - 0  Moving slowly or fidgety/restless - - 0 - 0  Suicidal thoughts - - 0 - 0  PHQ-9 Score - - 0 - 4   Review of Systems  Constitutional: Negative.   HENT:  Negative.   Eyes: Negative.   Respiratory: Negative.   Cardiovascular: Negative.   Gastrointestinal: Negative.   Endocrine: Negative.   Genitourinary: Negative.   Musculoskeletal: Negative.   Skin: Negative.   Allergic/Immunologic: Negative.   Neurological: Negative.   Hematological: Negative.   Psychiatric/Behavioral: Negative.   All other systems reviewed and are negative.      Objective:   Physical Exam  Constitutional: She is oriented to person, place, and time. She appears well-developed and well-nourished.  HENT:  Head: Normocephalic and atraumatic.  Neck: Normal range of motion. Neck supple.  Cardiovascular: Normal rate and regular rhythm.   Pulmonary/Chest: Effort normal and breath sounds normal.  Musculoskeletal:  Normal Muscle Bulk and Muscle Testing Reveals: Upper Extremities: Full ROM and Muscle Strength  5/5 Thoracic Paraspinal Tenderness: T-1-T-3  T-7- T-9 Lower Extremities; Full ROM and Muscle Strength 5/5 Arises from Table with ease Narrow Based Gait   Neurological: She is alert and oriented to person, place, and time.  Skin: Skin is warm and dry.  Psychiatric: She has a normal mood and affect.  Nursing note and vitals reviewed.         Assessment & Plan:  1. Bilateral sacroiliac joint dysfunction:  Refilled: HYDROcodone 7.5/325mg  one tablet every 6 hrs as needed # 120. OPANA Discontinued. We will continue the opioid monitoring program, this consists of regular clinic visits, examinations, urine drug screen pill counts as well as use of [place] Controlled Substance reporting System. 2. Chronic pain syndrome consistent with fibromyalgia. Continue with exercise regime, heat therapy,voltaren gel, lidocaine patches and topamax. Continue current analgesics  3. Right lumbar radiculopathy:Continue Elavil.  4. Complex Regional Pain Syndrome: Continue Cymbalta Dr. Yolanda Bonine Following  5. Type 2 diabetes.: PMD Following  6. Muscle Spasms: Continue Tizanidine 4 mg one tablet every 8 hours as needed for muscle spasms. 7. Insomnia: Continue Elavil  20 minutes of face to face patient care time was spent during this visit. All questions were encouraged and answered.

## 2016-08-16 LAB — TOXASSURE SELECT,+ANTIDEPR,UR

## 2016-08-19 NOTE — Progress Notes (Signed)
Urine drug screen for this encounter is consistent for prescribed medication 

## 2016-08-22 ENCOUNTER — Telehealth: Payer: Self-pay | Admitting: Registered Nurse

## 2016-08-22 NOTE — Telephone Encounter (Signed)
On January 18,2018 NCCSR was reviewed: No conflict was seen on the [place]stance Reporting System with Multiple Prescribers. Ms.Bunyard has a Dentistsigned  Narcotic Contract with our office. If there were any discrepancies this would have been  reported to her  Physcian.

## 2016-09-12 ENCOUNTER — Telehealth: Payer: Self-pay | Admitting: Registered Nurse

## 2016-09-12 ENCOUNTER — Encounter: Payer: Medicare Other | Attending: Physical Medicine & Rehabilitation | Admitting: Registered Nurse

## 2016-09-12 ENCOUNTER — Encounter: Payer: Self-pay | Admitting: Registered Nurse

## 2016-09-12 VITALS — BP 122/87 | HR 82

## 2016-09-12 DIAGNOSIS — M797 Fibromyalgia: Secondary | ICD-10-CM

## 2016-09-12 DIAGNOSIS — M7062 Trochanteric bursitis, left hip: Secondary | ICD-10-CM

## 2016-09-12 DIAGNOSIS — M609 Myositis, unspecified: Secondary | ICD-10-CM | POA: Diagnosis not present

## 2016-09-12 DIAGNOSIS — G9050 Complex regional pain syndrome I, unspecified: Secondary | ICD-10-CM | POA: Insufficient documentation

## 2016-09-12 DIAGNOSIS — M791 Myalgia: Secondary | ICD-10-CM | POA: Diagnosis not present

## 2016-09-12 DIAGNOSIS — M546 Pain in thoracic spine: Secondary | ICD-10-CM | POA: Diagnosis not present

## 2016-09-12 DIAGNOSIS — M47817 Spondylosis without myelopathy or radiculopathy, lumbosacral region: Secondary | ICD-10-CM | POA: Insufficient documentation

## 2016-09-12 DIAGNOSIS — Z79899 Other long term (current) drug therapy: Secondary | ICD-10-CM | POA: Diagnosis not present

## 2016-09-12 DIAGNOSIS — M461 Sacroiliitis, not elsewhere classified: Secondary | ICD-10-CM | POA: Diagnosis not present

## 2016-09-12 DIAGNOSIS — M5416 Radiculopathy, lumbar region: Secondary | ICD-10-CM | POA: Insufficient documentation

## 2016-09-12 DIAGNOSIS — Z5181 Encounter for therapeutic drug level monitoring: Secondary | ICD-10-CM | POA: Diagnosis not present

## 2016-09-12 DIAGNOSIS — M5010 Cervical disc disorder with radiculopathy, unspecified cervical region: Secondary | ICD-10-CM | POA: Diagnosis not present

## 2016-09-12 DIAGNOSIS — G5642 Causalgia of left upper limb: Secondary | ICD-10-CM

## 2016-09-12 DIAGNOSIS — M7061 Trochanteric bursitis, right hip: Secondary | ICD-10-CM | POA: Diagnosis not present

## 2016-09-12 DIAGNOSIS — G894 Chronic pain syndrome: Secondary | ICD-10-CM | POA: Insufficient documentation

## 2016-09-12 DIAGNOSIS — G4700 Insomnia, unspecified: Secondary | ICD-10-CM

## 2016-09-12 MED ORDER — HYDROCODONE-ACETAMINOPHEN 7.5-325 MG PO TABS: 1.0000 | ORAL_TABLET | Freq: Four times a day (QID) | ORAL | 0 refills | Status: DC | PRN

## 2016-09-12 NOTE — Telephone Encounter (Signed)
On 09/12/2016 the  NCCSR was reviewed no conflict was seen on the [facility] Reporting System with multiple prescribers. [name] has a signed narcotic contract with our office. If there were any discrepancies this would have been reported to her physician.

## 2016-09-12 NOTE — Progress Notes (Signed)
Subjective:    Patient ID: Shannon French, female    DOB: 07/13/1966, 51 y.o.   MRN: [id number]  HPI: [name] is a 51 year old female who returns for follow up appointment for chronic pain and medication refill. She states her pain is located in her mid- lower back and bilateral hips.She rates her pain 6. Her current exercise regime is walking three days a week, performing stretching exercises,attending [employer] three days a week and [facility] two days a [facility].  Pain Inventory Average Pain 5 Pain Right Now 6 My pain is constant, sharp, burning, dull, stabbing, tingling and aching  In the last 24 hours, has pain interfered with the following? General activity 7 Relation with others 7 Enjoyment of life 7 What TIME of day is your pain at its worst? all Sleep (in general) Fair  Pain is worse with: walking, bending, sitting, inactivity, standing and some activites Pain improves with: rest, heat/ice, therapy/exercise, pacing activities and medication Relief from Meds: 4  Mobility walk without assistance walk with assistance how many minutes can you walk? 45 ability to climb steps?  yes do you drive?  yes transfers alone Do you have any goals in this area?  yes  Function disabled: date disabled 2003 I need assistance with the following:  meal prep, household duties and shopping Do you have any goals in this area?  yes  Neuro/Psych weakness numbness tingling trouble walking spasms  Prior Studies Any changes since last visit?  no  Physicians involved in your care Any changes since last visit?  yes   Family History  Problem Relation Age of Onset  . Cancer Father    Social History   Social History  . Marital status: Single    Spouse name: N/A  . Number of children: N/A  . Years of education: N/A   Social History Main Topics  . Smoking status: Former Smoker    Quit date: 10/14/2009  . Smokeless tobacco: Never Used  . Alcohol use No    . Drug use: No  . Sexual activity: Not Asked   Other Topics Concern  . None   Social History Narrative  . None   Past Surgical History:  Procedure Laterality Date  . ABDOMINAL HYSTERECTOMY    . APPENDECTOMY    . CHOLECYSTECTOMY    . OVARY SURGERY     Past Medical History:  Diagnosis Date  . Arm pain    left  . Degenerative disc disease   . Diabetes mellitus   . Fibromyalgia   . Hyperlipidemia   . Hypertension   . IBS (irritable bowel syndrome)   . Osteoporosis    There were no vitals taken for this visit.  Opioid Risk Score:   Fall Risk Score:  `1  Depression screen PHQ 2/9  [facility] 08/12/2016 12/27/2015 11/01/2015 03/20/2015 11/04/2014  Decreased Interest 0 0 0 0 0  Down, Depressed, Hopeless 0 0 0 0 0  PHQ - 2 Score 0 0 0 0 0  Altered sleeping - - 0 - 3  Tired, decreased energy - - 0 - 1  Change in appetite - - 0 - 0  Feeling bad or failure about yourself  - - 0 - 0  Trouble concentrating - - 0 - 0  Moving slowly or fidgety/restless - - 0 - 0  Suicidal thoughts - - 0 - 0  PHQ-9 Score - - 0 - 4  Review of Systems  HENT: Negative.   Eyes: Negative.   Respiratory: Negative.   Cardiovascular: Negative.   Gastrointestinal: Negative.   Endocrine: Negative.   Genitourinary: Negative.   Musculoskeletal: Positive for arthralgias, back pain, gait problem, myalgias, neck pain and neck stiffness.       Spasms  Skin: Negative.   Allergic/Immunologic: Negative.   Neurological: Positive for weakness and numbness.       Tingling  Hematological: Negative.   Psychiatric/Behavioral: Negative.   All other systems reviewed and are negative.      Objective:   Physical Exam  Constitutional: She is oriented to person, place, and time. She appears well-developed and well-nourished.  HENT:  Head: Normocephalic and atraumatic.  Neck: Normal range of motion. Neck supple.  Cervical Paraspinal Tenderness: C-5-C-6  Cardiovascular: Normal rate and regular  rhythm.   Pulmonary/Chest: Effort normal and breath sounds normal.  Musculoskeletal:  Normal Muscle Bulk and Muscle Testing Reveals: Upper Extremities: Full ROM and Muscle Strength 5/5 Thoracic Paraspinal Tenderness: T-1-T-3  T-7-T-9 Lumbar Paraspinal Tenderness: L-3-L-5 Lower Extremities: Full ROM and Muscle Strength 5/5 Arises from Table with Ease  Narrow Based Gait    Neurological: She is alert and oriented to person, place, and time.  Skin: Skin is warm and dry.  Psychiatric: She has a normal mood and affect.  Nursing note and vitals reviewed.         Assessment & Plan:  1. Bilateral sacroiliac joint dysfunction: 09/12/2016 Refilled:HYDROcodone 7.5/325mg  one tablet every 6 hrs as needed # 120.  We will continue the opioid monitoring program, this consists of regular clinic visits, examinations, urine drug screen pill counts as well as use of [place] Controlled Substance reporting System. 2. Chronic pain syndrome consistent with fibromyalgia. Continue with exercise regime, heat therapy,voltaren gel, lidocaine patches and topamax. Continue current analgesics . 09/12/2016 3. Lumbosacral Spondylosis/ Right lumbar radiculopathy:Continue Elavil. 09/12/2016 4. Complex Regional Pain Syndrome: Continue Cymbalta. 09/12/2016 Dr. Yolanda Bonine Following  5. Type 2 diabetes.: PMD Following  6. Muscle Spasms: Continue Tizanidine 4 mg one tablet every 8 hours as needed for muscle spasms. 09/12/2016 7. Insomnia: Continue Elavil. 09/12/2016 8. Bilateral Greater Trochanteric Bursitis: Continue Heat/Ice Therapy. Continue to Monitor  20 minutes of face to face patient care time was spent during this visit. All questions were encouraged and answered.  F/U in 1 month

## 2016-10-14 ENCOUNTER — Encounter: Payer: Medicare Other | Attending: Physical Medicine & Rehabilitation | Admitting: Registered Nurse

## 2016-10-14 ENCOUNTER — Encounter: Payer: Self-pay | Admitting: Registered Nurse

## 2016-10-14 VITALS — BP 116/79 | HR 87

## 2016-10-14 DIAGNOSIS — M542 Cervicalgia: Secondary | ICD-10-CM | POA: Diagnosis not present

## 2016-10-14 DIAGNOSIS — M7061 Trochanteric bursitis, right hip: Secondary | ICD-10-CM | POA: Diagnosis not present

## 2016-10-14 DIAGNOSIS — G4700 Insomnia, unspecified: Secondary | ICD-10-CM

## 2016-10-14 DIAGNOSIS — M5010 Cervical disc disorder with radiculopathy, unspecified cervical region: Secondary | ICD-10-CM | POA: Insufficient documentation

## 2016-10-14 DIAGNOSIS — M461 Sacroiliitis, not elsewhere classified: Secondary | ICD-10-CM | POA: Insufficient documentation

## 2016-10-14 DIAGNOSIS — M5416 Radiculopathy, lumbar region: Secondary | ICD-10-CM | POA: Diagnosis not present

## 2016-10-14 DIAGNOSIS — Z5181 Encounter for therapeutic drug level monitoring: Secondary | ICD-10-CM | POA: Insufficient documentation

## 2016-10-14 DIAGNOSIS — G9050 Complex regional pain syndrome I, unspecified: Secondary | ICD-10-CM | POA: Diagnosis not present

## 2016-10-14 DIAGNOSIS — G5642 Causalgia of left upper limb: Secondary | ICD-10-CM | POA: Diagnosis not present

## 2016-10-14 DIAGNOSIS — M797 Fibromyalgia: Secondary | ICD-10-CM

## 2016-10-14 DIAGNOSIS — M47817 Spondylosis without myelopathy or radiculopathy, lumbosacral region: Secondary | ICD-10-CM | POA: Insufficient documentation

## 2016-10-14 DIAGNOSIS — Z79899 Other long term (current) drug therapy: Secondary | ICD-10-CM | POA: Diagnosis not present

## 2016-10-14 DIAGNOSIS — M546 Pain in thoracic spine: Secondary | ICD-10-CM

## 2016-10-14 DIAGNOSIS — G894 Chronic pain syndrome: Secondary | ICD-10-CM | POA: Insufficient documentation

## 2016-10-14 DIAGNOSIS — M7062 Trochanteric bursitis, left hip: Secondary | ICD-10-CM

## 2016-10-14 DIAGNOSIS — M609 Myositis, unspecified: Secondary | ICD-10-CM | POA: Diagnosis not present

## 2016-10-14 DIAGNOSIS — M791 Myalgia: Secondary | ICD-10-CM | POA: Insufficient documentation

## 2016-10-14 MED ORDER — MELOXICAM 15 MG PO TABS: ORAL_TABLET | ORAL | 3 refills | Status: DC

## 2016-10-14 MED ORDER — HYDROCODONE-ACETAMINOPHEN 7.5-325 MG PO TABS: 1.0000 | ORAL_TABLET | Freq: Four times a day (QID) | ORAL | 0 refills | Status: DC | PRN

## 2016-10-14 MED ORDER — AMITRIPTYLINE HCL 10 MG PO TABS: 10.0000 mg | ORAL_TABLET | Freq: Every day | ORAL | 1 refills | Status: DC

## 2016-10-14 MED ORDER — DULOXETINE HCL 30 MG PO CPEP: ORAL_CAPSULE | ORAL | 3 refills | Status: DC

## 2016-10-14 NOTE — Progress Notes (Signed)
Subjective:    Patient ID: Shannon French, female    DOB: 03-16-1966, 51 y.o.   MRN: [id number]  HPI: [name] is a 51 year old female who returns for follow up appointmentfor chronic pain and medication refill. She states her pain is located in her neck, mid- lower back radiating into her bilateral hips and bilateral lower extremities. [name] states the pain has intensified over the last few weeks. States her pain is worse with walking, bending and has impede her ability to exercise. She rates her pain 6. She's not able to follow her usual exercise regime due to increase intensity of pain. [name] asked if she could have the Radiofrequency with D. Kirsteins, also asked if she could have bilateral RF's, due to co-pay. This was discussed with Dr. Wynn Banker he only performs one side at a time she verbalizes understanding.  Last RF's were 07/21/2014 on the Right and Left 08/18/2014, this will be discussed with Dr. Riley Kill if he concurs she will be scheduled on 10/31/2016. She verbalizes understanding.  Discuss the above with Dr. Riley Kill.   Pain Inventory Average Pain 6 Pain Right Now 6 My pain is constant, sharp, burning, dull, stabbing, tingling and aching  In the last 24 hours, has pain interfered with the following? General activity 7 Relation with others 7 Enjoyment of life 7 What TIME of day is your pain at its worst? all Sleep (in general) Fair  Pain is worse with: walking, bending, sitting, inactivity, standing and some activites Pain improves with: rest, heat/ice, therapy/exercise, pacing activities and medication Relief from Meds: 5  Mobility walk without assistance walk with assistance how many minutes can you walk? 45 ability to climb steps?  yes do you drive?  yes Do you have any goals in this area?  yes  Function disabled: date disabled 76 I need assistance with the following:  meal prep, household duties and shopping Do you have any goals in this area?   yes  Neuro/Psych weakness numbness tingling spasms  Prior Studies Any changes since last visit?  no  Physicians involved in your care Any changes since last visit?  no   Family History  Problem Relation Age of Onset  . Cancer Father    Social History   Social History  . Marital status: Single    Spouse name: N/A  . Number of children: N/A  . Years of education: N/A   Social History Main Topics  . Smoking status: Former Smoker    Quit date: 10/14/2009  . Smokeless tobacco: Never Used  . Alcohol use No  . Drug use: No  . Sexual activity: Not Asked   Other Topics Concern  . None   Social History Narrative  . None   Past Surgical History:  Procedure Laterality Date  . ABDOMINAL HYSTERECTOMY    . APPENDECTOMY    . CHOLECYSTECTOMY    . OVARY SURGERY     Past Medical History:  Diagnosis Date  . Arm pain    left  . Degenerative disc disease   . Diabetes mellitus   . Fibromyalgia   . Hyperlipidemia   . Hypertension   . IBS (irritable bowel syndrome)   . Osteoporosis    BP 116/79 (BP Location: Right Arm, Patient Position: Sitting, Cuff Size: Large)   Pulse 87   SpO2 97%   Opioid Risk Score:   Fall Risk Score:  `1  Depression screen PHQ 2/9  Depression screen [facility] 2/9 08/12/2016 12/27/2015 11/01/2015  03/20/2015 11/04/2014  Decreased Interest 0 0 0 0 0  Down, Depressed, Hopeless 0 0 0 0 0  PHQ - 2 Score 0 0 0 0 0  Altered sleeping - - 0 - 3  Tired, decreased energy - - 0 - 1  Change in appetite - - 0 - 0  Feeling bad or failure about yourself  - - 0 - 0  Trouble concentrating - - 0 - 0  Moving slowly or fidgety/restless - - 0 - 0  Suicidal thoughts - - 0 - 0  PHQ-9 Score - - 0 - 4    Review of Systems  HENT: Negative.   Eyes: Negative.   Respiratory: Negative.   Cardiovascular: Negative.   Gastrointestinal: Negative.   Endocrine: Negative.   Genitourinary: Negative.   Musculoskeletal: Positive for arthralgias, back pain, myalgias and neck pain.        Spasms   Skin: Negative.   Allergic/Immunologic: Negative.   Neurological: Positive for weakness and numbness.       Tingling  Hematological: Negative.   Psychiatric/Behavioral: Negative.   All other systems reviewed and are negative.      Objective:   Physical Exam  Constitutional: She is oriented to person, place, and time. She appears well-developed and well-nourished.  HENT:  Head: Normocephalic and atraumatic.  Neck: Normal range of motion. Neck supple.  Cervical Paraspinal Tenderness: C-5-C-6  Cardiovascular: Normal rate and regular rhythm.   Pulmonary/Chest: Effort normal and breath sounds normal.  Musculoskeletal:  Normal Muscle Bulk and Muscle Testing Reveals: Upper Extremities: Full ROM and Muscle Strength 5/5 Thoracic Paraspinal Tenderness: T-7-T-9 Lumbar Paraspinal Tenderness: L-3-L-5 Sacral Tenderness Noted Lower Extremities: Full ROM and Muscle Strength 5/5 + Straight Leg Raise ( Right and Left) Right Lower Extremity Flexion Produces Pain into Lumbar and Lower Extremity Left Lower Extremity Flexion Produces Pain into Lower Back , Buttock and Left Leg Arises from Table Slolwy  Antalgic Gait   Neurological: She is alert and oriented to person, place, and time.  Skin: Skin is warm and dry.  Psychiatric: She has a normal mood and affect.  Nursing note and vitals reviewed.         Assessment & Plan:  1. Bilateral sacroiliac joint dysfunction: 10/14/2016 Refilled:HYDROcodone 7.5/325mg  one tablet every 6 hrs as needed # 120.  We will continue the opioid monitoring program, this consists of regular clinic visits, examinations, urine drug screen pill counts as well as use of [place]stance [place]. 2. Chronic pain syndrome consistent with fibromyalgia. Continue with exercise regime, heat therapy,voltaren gel, lidocaine patches and topamax. Continue current analgesics . 10/14/2016 3. Lumbosacral Spondylosis/ Right lumbar  radiculopathy:Continue Elavil. 10/14/2016 4. Complex Regional Pain Syndrome: Continue Cymbalta. 10/14/2016 Dr. Yolanda BonineBertrand Following  5. Type 2 diabetes.: PMD Following  6. Muscle Spasms: Continue Tizanidine 4 mg one tablet every 8 hours as needed for muscle spasms. 10/14/2016 7. Insomnia: Continue Elavil. 10/14/2016 8. Bilateral Greater Trochanteric Bursitis: Continue Heat/Ice Therapy. Continue to Monitor  20 minutes of face to face patient care time was spent during this visit. All questions were encouraged and answered.  F/U in 1 month

## 2016-10-24 ENCOUNTER — Other Ambulatory Visit: Payer: Self-pay | Admitting: Registered Nurse

## 2016-10-28 ENCOUNTER — Other Ambulatory Visit: Payer: Self-pay | Admitting: Registered Nurse

## 2016-11-07 ENCOUNTER — Other Ambulatory Visit: Payer: Self-pay | Admitting: Registered Nurse

## 2016-11-07 DIAGNOSIS — M47817 Spondylosis without myelopathy or radiculopathy, lumbosacral region: Secondary | ICD-10-CM

## 2016-11-07 DIAGNOSIS — M797 Fibromyalgia: Secondary | ICD-10-CM

## 2016-11-12 ENCOUNTER — Encounter: Payer: Medicare Other | Attending: Physical Medicine & Rehabilitation | Admitting: Registered Nurse

## 2016-11-12 ENCOUNTER — Encounter: Payer: Self-pay | Admitting: Registered Nurse

## 2016-11-12 VITALS — BP 132/84 | HR 78 | Resp 14

## 2016-11-12 DIAGNOSIS — M5010 Cervical disc disorder with radiculopathy, unspecified cervical region: Secondary | ICD-10-CM | POA: Insufficient documentation

## 2016-11-12 DIAGNOSIS — Z5181 Encounter for therapeutic drug level monitoring: Secondary | ICD-10-CM

## 2016-11-12 DIAGNOSIS — M47817 Spondylosis without myelopathy or radiculopathy, lumbosacral region: Secondary | ICD-10-CM | POA: Diagnosis not present

## 2016-11-12 DIAGNOSIS — Z79899 Other long term (current) drug therapy: Secondary | ICD-10-CM | POA: Diagnosis not present

## 2016-11-12 DIAGNOSIS — M609 Myositis, unspecified: Secondary | ICD-10-CM | POA: Insufficient documentation

## 2016-11-12 DIAGNOSIS — M797 Fibromyalgia: Secondary | ICD-10-CM

## 2016-11-12 DIAGNOSIS — G5642 Causalgia of left upper limb: Secondary | ICD-10-CM

## 2016-11-12 DIAGNOSIS — M546 Pain in thoracic spine: Secondary | ICD-10-CM

## 2016-11-12 DIAGNOSIS — M791 Myalgia: Secondary | ICD-10-CM | POA: Diagnosis not present

## 2016-11-12 DIAGNOSIS — G894 Chronic pain syndrome: Secondary | ICD-10-CM

## 2016-11-12 DIAGNOSIS — M5416 Radiculopathy, lumbar region: Secondary | ICD-10-CM

## 2016-11-12 DIAGNOSIS — M542 Cervicalgia: Secondary | ICD-10-CM | POA: Diagnosis not present

## 2016-11-12 DIAGNOSIS — G9050 Complex regional pain syndrome I, unspecified: Secondary | ICD-10-CM | POA: Diagnosis not present

## 2016-11-12 DIAGNOSIS — M62830 Muscle spasm of back: Secondary | ICD-10-CM

## 2016-11-12 DIAGNOSIS — M461 Sacroiliitis, not elsewhere classified: Secondary | ICD-10-CM

## 2016-11-12 MED ORDER — DULOXETINE HCL 60 MG PO CPEP: ORAL_CAPSULE | ORAL | 3 refills | Status: DC

## 2016-11-12 MED ORDER — HYDROCODONE-ACETAMINOPHEN 7.5-325 MG PO TABS: 1.0000 | ORAL_TABLET | Freq: Four times a day (QID) | ORAL | 0 refills | Status: DC | PRN

## 2016-11-12 NOTE — Progress Notes (Signed)
Subjective:    Patient ID: Shannon French, female    DOB: 04-24-1966, 51 y.o.   MRN: [id number]  HPI: [name] is a 51 year old female who returns for follow up appointmentfor chronic pain and medication refill. She states her pain is located in her neck, mid- lower back radiating into her bilateral hips and bilateral lower extremities laterally.She rates her pain 7. Her current exercise regime is walking, going to [employer] three days a week and going to [facility] for swimming twice a week.   Last UDS was on 08/12/2016 was consistent.   Pain Inventory Average Pain 7 Pain Right Now 7 My pain is constant, sharp, burning, dull, stabbing, tingling and aching  In the last 24 hours, has pain interfered with the following? General activity 7 Relation with others 7 Enjoyment of life 7 What TIME of day is your pain at its worst? all Sleep (in general) Fair  Pain is worse with: walking, bending, sitting, inactivity, standing and some activites Pain improves with: rest, heat/ice, therapy/exercise, pacing activities and medication Relief from Meds: 6  Mobility walk without assistance walk with assistance how many minutes can you walk? 30 ability to climb steps?  yes do you drive?  yes  Function disabled: date disabled 2003 I need assistance with the following:  meal prep, household duties and shopping  Neuro/Psych weakness numbness tingling trouble walking spasms  Prior Studies Any changes since last visit?  no  Physicians involved in your care Any changes since last visit?  no   Family History  Problem Relation Age of Onset  . Cancer Father    Social History   Social History  . Marital status: Single    Spouse name: N/A  . Number of children: N/A  . Years of education: N/A   Social History Main Topics  . Smoking status: Former Smoker    Quit date: 10/14/2009  . Smokeless tobacco: Never Used  . Alcohol use No  . Drug use: No  . Sexual activity: Not Asked    Other Topics Concern  . None   Social History Narrative  . None   Past Surgical History:  Procedure Laterality Date  . ABDOMINAL HYSTERECTOMY    . APPENDECTOMY    . CHOLECYSTECTOMY    . OVARY SURGERY     Past Medical History:  Diagnosis Date  . Arm pain    left  . Degenerative disc disease   . Diabetes mellitus   . Fibromyalgia   . Hyperlipidemia   . Hypertension   . IBS (irritable bowel syndrome)   . Osteoporosis    BP 132/84   Pulse 78   Resp 14   SpO2 98%   Opioid Risk Score:   Fall Risk Score:  `1  Depression screen PHQ 2/9  Depression screen [facility] 2/9 11/12/2016 08/12/2016 12/27/2015 11/01/2015 03/20/2015 11/04/2014  Decreased Interest 0 0 0 0 0 0  Down, Depressed, Hopeless 0 0 0 0 0 0  PHQ - 2 Score 0 0 0 0 0 0  Altered sleeping - - - 0 - 3  Tired, decreased energy - - - 0 - 1  Change in appetite - - - 0 - 0  Feeling bad or failure about yourself  - - - 0 - 0  Trouble concentrating - - - 0 - 0  Moving slowly or fidgety/restless - - - 0 - 0  Suicidal thoughts - - - 0 - 0  PHQ-9 Score - - -  0 - 4    Review of Systems  HENT: Negative.   Eyes: Negative.   Respiratory: Negative.   Cardiovascular: Negative.   Gastrointestinal: Negative.   Endocrine: Negative.   Genitourinary: Negative.   Musculoskeletal: Positive for gait problem.       Spasms  Skin: Negative.   Allergic/Immunologic: Negative.   Neurological: Positive for weakness and numbness.       Tingling  Hematological: Negative.   Psychiatric/Behavioral: Negative.   All other systems reviewed and are negative.      Objective:   Physical Exam  Constitutional: She is oriented to person, place, and time. She appears well-developed and well-nourished.  HENT:  Head: Normocephalic and atraumatic.  Neck: Normal range of motion. Neck supple.  No Cervical Tenderness with Palpation  Cardiovascular: Normal rate and regular rhythm.   Pulmonary/Chest: Effort normal and breath sounds normal.   Musculoskeletal:  Normal Muscle Bulk and Muscle Testing Reveals: Upper Extremities: Full ROM and Muscle Strength 5/5 Thoracic Paraspinal Tenderness: T-1-T-3 Lumbar Paraspinal Tenderness: L-3-L-5 Lower Extremities: Full ROM and Muscle Strength 5/5 Arises from Table with ease Narrow Based Gait  Neurological: She is alert and oriented to person, place, and time.  Skin: Skin is warm and dry.  Psychiatric: She has a normal mood and affect.  Nursing note and vitals reviewed.         Assessment & Plan:  1. Bilateral sacroiliac joint dysfunction: 11/12/2016 Refilled:HYDROcodone 7.5/325mg  one tablet every 6 hrs as needed # 120.  We will continue the opioid monitoring program, this consists of regular clinic visits, examinations, urine drug screen pill counts as well as use of [place] Controlled Substance reporting System. 2. Chronic pain syndrome consistent with fibromyalgia. Continue with exercise regime, heat therapy,voltaren gel, lidocaine patches and topamax. Continue current analgesics . 11/12/2016 3. Lumbosacral Spondylosis/ Bilateral lumbar radiculopathy:Continue Elavil. 11/12/2016 4. Complex Regional Pain Syndrome: Continue Cymbalta. 11/12/2016 Dr. Yolanda Bonine Following  5. Type 2 diabetes.: PMD Following. 11/12/2016 6. Muscle Spasms: Continue Tizanidine 4 mg one tablet every 8 hours as needed for muscle spasms. 11/12/2016 7. Insomnia: Continue Elavil. 11/12/2016 8. Bilateral Greater Trochanteric Bursitis: Continue Heat/Ice Therapy. Continue to Monitor. 11/12/2016  20  minutes of face to face patient care time was spent during this visit. All questions were encouraged and answered.   F/U in 1 month

## 2016-12-16 ENCOUNTER — Encounter: Payer: Self-pay | Admitting: Physical Medicine & Rehabilitation

## 2016-12-16 ENCOUNTER — Encounter: Payer: Medicare Other | Attending: Physical Medicine & Rehabilitation | Admitting: Physical Medicine & Rehabilitation

## 2016-12-16 VITALS — BP 125/81 | HR 83

## 2016-12-16 DIAGNOSIS — G894 Chronic pain syndrome: Secondary | ICD-10-CM | POA: Insufficient documentation

## 2016-12-16 DIAGNOSIS — M5416 Radiculopathy, lumbar region: Secondary | ICD-10-CM | POA: Insufficient documentation

## 2016-12-16 DIAGNOSIS — M5010 Cervical disc disorder with radiculopathy, unspecified cervical region: Secondary | ICD-10-CM | POA: Insufficient documentation

## 2016-12-16 DIAGNOSIS — M609 Myositis, unspecified: Secondary | ICD-10-CM | POA: Insufficient documentation

## 2016-12-16 DIAGNOSIS — M47817 Spondylosis without myelopathy or radiculopathy, lumbosacral region: Secondary | ICD-10-CM

## 2016-12-16 DIAGNOSIS — Z5181 Encounter for therapeutic drug level monitoring: Secondary | ICD-10-CM | POA: Insufficient documentation

## 2016-12-16 DIAGNOSIS — M461 Sacroiliitis, not elsewhere classified: Secondary | ICD-10-CM | POA: Diagnosis not present

## 2016-12-16 DIAGNOSIS — G9050 Complex regional pain syndrome I, unspecified: Secondary | ICD-10-CM | POA: Insufficient documentation

## 2016-12-16 DIAGNOSIS — G90529 Complex regional pain syndrome I of unspecified lower limb: Secondary | ICD-10-CM

## 2016-12-16 DIAGNOSIS — M791 Myalgia: Secondary | ICD-10-CM | POA: Insufficient documentation

## 2016-12-16 DIAGNOSIS — Z79899 Other long term (current) drug therapy: Secondary | ICD-10-CM | POA: Diagnosis not present

## 2016-12-16 MED ORDER — HYDROCODONE-ACETAMINOPHEN 7.5-325 MG PO TABS: 1.0000 | ORAL_TABLET | Freq: Four times a day (QID) | ORAL | 0 refills | Status: DC | PRN

## 2016-12-16 NOTE — Patient Instructions (Signed)
PLEASE FEEL FREE TO CALL OUR OFFICE WITH ANY PROBLEMS OR QUESTIONS ([phone])      

## 2016-12-16 NOTE — Addendum Note (Signed)
Addended by: Barbee ShropshireBRIGHT, BRUCE B on: 12/16/2016 10:43 AM   Modules accepted: Orders

## 2016-12-16 NOTE — Progress Notes (Signed)
Subjective:    Patient ID: Shannon French, female    DOB: 09/08/1965, 51 y.o.   MRN: [id number]  HPI   Shannon RoundsSally is here in follow up of her chronic pain. She continues to go to the gym. She pushes herself to stay active. She feels more pain after she is more active in general. Her back pain seems to be increasing more recently. She had good results with the RF's in January of 2016.  She is using her topamax at night for headache prophylaxis and she takes hydrocodone for her back and pelvic pain  Shannon RoundsSally has noticed increased dizziness when standing. She has had to stop acitivites at times due to her symptoms. It is mostly just light-headedness. She hasn't checked her bp while standing. She continues on lisinopril and toprol for bp control. She hasn't noticed any coorelation with any of her meds.      Pain Inventory Average Pain 7 Pain Right Now 7 My pain is .  In the last 24 hours, has pain interfered with the following? General activity 7 Relation with others 6 Enjoyment of life 7 What TIME of day is your pain at its worst? . Sleep (in general) Fair  Pain is worse with: walking, bending, sitting, inactivity, standing and some activites Pain improves with: rest, heat/ice, therapy/exercise, pacing activities and medication Relief from Meds: 5  Mobility walk without assistance walk with assistance use a cane ability to climb steps?  yes do you drive?  yes  Function disabled: date disabled 2003  Neuro/Psych weakness numbness tingling trouble walking spasms dizziness  Prior Studies Any changes since last visit?  no  Physicians involved in your care Any changes since last visit?  no   Family History  Problem Relation Age of Onset  . Cancer Father    Social History   Social History  . Marital status: Single    Spouse name: N/A  . Number of children: N/A  . Years of education: N/A   Social History Main Topics  . Smoking status: Former Smoker    Quit date:  10/14/2009  . Smokeless tobacco: Never Used  . Alcohol use No  . Drug use: No  . Sexual activity: Not Asked   Other Topics Concern  . None   Social History Narrative  . None   Past Surgical History:  Procedure Laterality Date  . ABDOMINAL HYSTERECTOMY    . APPENDECTOMY    . CHOLECYSTECTOMY    . OVARY SURGERY     Past Medical History:  Diagnosis Date  . Arm pain    left  . Degenerative disc disease   . Diabetes mellitus   . Fibromyalgia   . Hyperlipidemia   . Hypertension   . IBS (irritable bowel syndrome)   . Osteoporosis    BP 125/81   Pulse 83   SpO2 97%   Opioid Risk Score:   Fall Risk Score:  `1  Depression screen PHQ 2/9  [facility] 11/12/2016 08/12/2016 12/27/2015 11/01/2015 03/20/2015 11/04/2014  Decreased Interest 0 0 0 0 0 0  Down, Depressed, Hopeless 0 0 0 0 0 0  PHQ - 2 Score 0 0 0 0 0 0  Altered sleeping - - - 0 - 3  Tired, decreased energy - - - 0 - 1  Change in appetite - - - 0 - 0  Feeling bad or failure about yourself  - - - 0 - 0  Trouble concentrating - - - 0 - 0  Moving slowly or fidgety/restless - - - 0 - 0  Suicidal thoughts - - - 0 - 0  PHQ-9 Score - - - 0 - 4    Review of Systems  Constitutional: Negative.   HENT: Negative.   Eyes: Negative.   Respiratory: Negative.   Cardiovascular: Negative.   Gastrointestinal: Negative.   Endocrine: Negative.   Genitourinary: Negative.   Musculoskeletal: Negative.   Skin: Negative.   Allergic/Immunologic: Negative.   Neurological: Negative.   Hematological: Negative.   Psychiatric/Behavioral: Negative.   All other systems reviewed and are negative.      Objective:   Physical Exam  Constitutional: She is oriented to person, place, and time. She appears well-developed and well-nourished.  Head: Normocephalic and atraumatic.  Eyes: PERRL.  Neck: Normal range of motion. Neck supple.  Cardiovascular: RRR  Pulmonary/Chest: CTA B  Abdominal: Soft.  Musculoskeletal: normal  finger ROM/fingers are a little cool. Mild low back tenderness along the L4-S1 levels with some spasm. As well as both PSIS.   Neurological: She is alert and oriented to person, place, and time.  Reflex Scores:  Patellar reflexes are 1+on the right side and 2+on the left side.  Achilles reflexes are 1+on the right side and 2+on the left side. 4/5 right ADF, APF, KE, HF. 4+/5 on left. UE motor normal as is sensation today. UE DTR's are 1+.  Psychiatric: She has a normal mood and affect. Her behavior is normal. Judgment and thought content normal. Cognition and memory are normal.  Assessment & Plan:  ASSESSMENT:  1. Bilateral sacroiliac joint dysfunction, status post radio  frequencies which have been efficacious. Pain has increased substantially again. It was January 2016 since these were last done.  2. Chronic pain syndrome consistent with fibromyalgia.  3. ?Right lumbar radiculopathy  3. Type 2 diabetes.  4. CRPS 1? LUE   PLAN:  1. Tizanidine for spasms and sleep. meloxicam prn for pain flares  2. Continue with desensitization exercises as she does. Very proud of her progress!             -i'm encouraged by her HEP, gym work outs 3. Hydrocodone was refilled. We will continue the opioid monitoring program, this consists of regular clinic visits, examinations, urine drug screen, pill counts as well as use of [place] Controlled Substance Reporting System. [facility] reviewed today.    4. Will refer back to Dr. Wynn Banker for bilateral SI RF's 5. Continue elavill 10-20mg  qhs for sleep/neuro pain---continue cymbalta at 90mg              - 6. Migraine headaches: continues on topamax qd  7. Dizziness. Could be mild orthostasis. She will check her bp in standing too to rule out change. Follow up with PCP as well. Certainly could be medication related as well.   8. Follow up with Dr. Wynn Banker as above. 15 minutes of face to face patient care time were spent during this visit. All  questions were encouraged and answered.

## 2016-12-20 ENCOUNTER — Other Ambulatory Visit: Payer: Self-pay

## 2016-12-20 DIAGNOSIS — M797 Fibromyalgia: Secondary | ICD-10-CM

## 2016-12-20 DIAGNOSIS — M47817 Spondylosis without myelopathy or radiculopathy, lumbosacral region: Secondary | ICD-10-CM

## 2016-12-20 MED ORDER — TIZANIDINE HCL 4 MG PO TABS: 4.0000 mg | ORAL_TABLET | Freq: Three times a day (TID) | ORAL | 0 refills | Status: DC | PRN

## 2016-12-22 LAB — DRUG TOX ALC METAB W/CON, ORAL FLD: Alcohol Metabolite: NEGATIVE ng/mL (ref <25)

## 2016-12-23 ENCOUNTER — Ambulatory Visit: Payer: Medicare Other | Admitting: Physical Medicine & Rehabilitation

## 2017-01-06 ENCOUNTER — Other Ambulatory Visit: Payer: Self-pay

## 2017-01-06 MED ORDER — DIAZEPAM 10 MG PO TABS: ORAL_TABLET | ORAL | 0 refills | Status: DC

## 2017-01-06 NOTE — Telephone Encounter (Signed)
Called to pharmacy and left message for Kennon RoundsSally on VM that it had been done.

## 2017-01-06 NOTE — Telephone Encounter (Signed)
Patient would like medication Diazepam to be called in to pharmacy before schedule procedure 01/10/2017.

## 2017-01-10 ENCOUNTER — Ambulatory Visit (HOSPITAL_BASED_OUTPATIENT_CLINIC_OR_DEPARTMENT_OTHER): Payer: Medicare Other | Admitting: Physical Medicine & Rehabilitation

## 2017-01-10 ENCOUNTER — Encounter: Payer: Self-pay | Admitting: Physical Medicine & Rehabilitation

## 2017-01-10 ENCOUNTER — Encounter: Payer: Medicare Other | Attending: Physical Medicine & Rehabilitation

## 2017-01-10 VITALS — BP 113/83 | HR 85

## 2017-01-10 DIAGNOSIS — M47817 Spondylosis without myelopathy or radiculopathy, lumbosacral region: Secondary | ICD-10-CM | POA: Diagnosis not present

## 2017-01-10 DIAGNOSIS — M609 Myositis, unspecified: Secondary | ICD-10-CM | POA: Diagnosis not present

## 2017-01-10 DIAGNOSIS — G894 Chronic pain syndrome: Secondary | ICD-10-CM | POA: Insufficient documentation

## 2017-01-10 DIAGNOSIS — Z5181 Encounter for therapeutic drug level monitoring: Secondary | ICD-10-CM

## 2017-01-10 DIAGNOSIS — M5416 Radiculopathy, lumbar region: Secondary | ICD-10-CM | POA: Diagnosis not present

## 2017-01-10 DIAGNOSIS — Z79899 Other long term (current) drug therapy: Secondary | ICD-10-CM

## 2017-01-10 DIAGNOSIS — G90529 Complex regional pain syndrome I of unspecified lower limb: Secondary | ICD-10-CM

## 2017-01-10 DIAGNOSIS — M791 Myalgia: Secondary | ICD-10-CM | POA: Insufficient documentation

## 2017-01-10 DIAGNOSIS — G8929 Other chronic pain: Secondary | ICD-10-CM

## 2017-01-10 DIAGNOSIS — M461 Sacroiliitis, not elsewhere classified: Secondary | ICD-10-CM | POA: Insufficient documentation

## 2017-01-10 DIAGNOSIS — G9050 Complex regional pain syndrome I, unspecified: Secondary | ICD-10-CM | POA: Diagnosis not present

## 2017-01-10 DIAGNOSIS — M533 Sacrococcygeal disorders, not elsewhere classified: Secondary | ICD-10-CM

## 2017-01-10 DIAGNOSIS — M5010 Cervical disc disorder with radiculopathy, unspecified cervical region: Secondary | ICD-10-CM | POA: Diagnosis not present

## 2017-01-10 MED ORDER — HYDROCODONE-ACETAMINOPHEN 7.5-325 MG PO TABS: 1.0000 | ORAL_TABLET | Freq: Four times a day (QID) | ORAL | 0 refills | Status: DC | PRN

## 2017-01-10 NOTE — Patient Instructions (Addendum)
You had a radio frequency procedure today This was done to alleviate joint pain in your lumbar area We injected lidocaine which is a local anesthetic.  You may experience soreness at the injection sites. You may also experienced some irritation of the nerves that were heated I'm recommending ice for 30 minutes every 2 hours as needed for the next 24-48 hours   

## 2017-01-10 NOTE — Progress Notes (Signed)
  PROCEDURE RECORD [facility]   Name: Shannon French DOB:11/30/1965 MRN: [id number]  Date:01/10/2017  Physician: Claudette LawsAndrew Kirsteins, MD    Nurse/CMA: Jailynne Opperman CMA  Allergies:  Allergies  Allergen Reactions  . Gabapentin [Gabapentin] Swelling    Suspects allergy was to dye; capsule was yellow  . Actos [Pioglitazone Hydrochloride] Other (See Comments) and Hypertension    HEADACHES  . Esomeprazole Magnesium Hives  . Glucophage [Metformin Hydrochloride] Hives and Swelling  . Iodinated Diagnostic Agents     Other reaction(s): Other (See Comments) Unknown  . Mepivacaine Hcl Hives and Itching    carbocaine  . Other Other (See Comments)    Uncoded Allergy. Allergen: BICARBOCAINE XRAY DYE - THROAT SWELLS  . Sulfa Antibiotics Hives    Consent Signed: Yes.    Is patient diabetic?yes  CBG today? 209 Pregnant: No. LMP: No LMP recorded. Patient has had a hysterectomy. (age 53-55)  Anticoagulants: no Anti-inflammatory: no Antibiotics: no  Procedure: B sacroiliac RF Position: Prone   Start Time:334pm    End Time:356    Fluoro Time: 62s  RN/CMA Chandlar Staebell CMA Tykera Skates CMA    Time 254 400pm    BP 113/83 139/82    Pulse 85 84    Respirations 16 16    O2 Sat 98 97    S/S 6 6    Pain Level 7/10 /10     D/C home with husband Mitch, patient A & O X 3, D/C instructions reviewed, and sits independently.

## 2017-01-10 NOTE — Progress Notes (Signed)
R L4 medial branch, R L5 dorsal ramus radiofrequency ablation Right S1,S2,S3, neurolysis under fluoro guidance  Informed consent was obtained after discussing risks and benefits of the procedure with the patient area and this includes bleeding, bruising, infection. Patient elects to proceed and has given written consent. Patient placed in a prone position low back and buttocks area on the right side marked and prepped with Betadine. Then a 25-gauge 1.5 inch needle was used to anesthetize the skin and subcutaneous tissue with 1% lidocaine, 2 cc into each of 5 areas. Then a 10 cm RF 18-gauge needle with a 1 cm curved active tip was inserted first targeting the junction of the sacroiliac and S1 SAP AP and lateral images confirm proper needle location followed by  motor stimulation at2 Hz respectively confirming proper needle location then a solution containing 1 cc  1% MPF lidocaine was injected times 1 cc. RF lesioning 80C 90 seconds. Then the right L5 SAP transverse process junction was targeted needle placed at junction AP and lateral images confirm proper normal location. motor stimulation at 2 Hz confirm proper needle location followed by injection of 1 cc of the lidocaine solution and radiofrequency lesioning 80C 90 seconds. Then the lateral aspect of the S1-S2 and S3 foramen or targeted on the right side needle was placed just lateral to the foramen. Motor stimulation up to 2 Hz showed no motor activity Followed by injection of 1 cc of the lidocaine solution at each of 3 areas and radiofrequency lesioning 80C 90 seconds. Patient tolerated procedure well Post procedure instructions given  Plan to do left side in 1.5 month

## 2017-01-13 ENCOUNTER — Ambulatory Visit: Payer: Medicare Other | Admitting: Registered Nurse

## 2017-02-18 ENCOUNTER — Telehealth: Payer: Self-pay | Admitting: Physical Medicine & Rehabilitation

## 2017-02-18 MED ORDER — DIAZEPAM 10 MG PO TABS: ORAL_TABLET | ORAL | 0 refills | Status: DC

## 2017-02-18 NOTE — Telephone Encounter (Signed)
Patient is needing Valium for her injection for this coming Friday if someone could go ahead and call it in for her.

## 2017-02-18 NOTE — Telephone Encounter (Signed)
Called to pharmacy and Kennon RoundsSally notified.

## 2017-02-21 ENCOUNTER — Encounter: Payer: Medicare Other | Attending: Physical Medicine & Rehabilitation

## 2017-02-21 ENCOUNTER — Ambulatory Visit (HOSPITAL_BASED_OUTPATIENT_CLINIC_OR_DEPARTMENT_OTHER): Payer: Medicare Other | Admitting: Physical Medicine & Rehabilitation

## 2017-02-21 DIAGNOSIS — M609 Myositis, unspecified: Secondary | ICD-10-CM | POA: Diagnosis not present

## 2017-02-21 DIAGNOSIS — Z79899 Other long term (current) drug therapy: Secondary | ICD-10-CM | POA: Diagnosis not present

## 2017-02-21 DIAGNOSIS — M5416 Radiculopathy, lumbar region: Secondary | ICD-10-CM | POA: Insufficient documentation

## 2017-02-21 DIAGNOSIS — Z5181 Encounter for therapeutic drug level monitoring: Secondary | ICD-10-CM

## 2017-02-21 DIAGNOSIS — G894 Chronic pain syndrome: Secondary | ICD-10-CM | POA: Insufficient documentation

## 2017-02-21 DIAGNOSIS — M47817 Spondylosis without myelopathy or radiculopathy, lumbosacral region: Secondary | ICD-10-CM | POA: Diagnosis not present

## 2017-02-21 DIAGNOSIS — M461 Sacroiliitis, not elsewhere classified: Secondary | ICD-10-CM | POA: Diagnosis not present

## 2017-02-21 DIAGNOSIS — M5010 Cervical disc disorder with radiculopathy, unspecified cervical region: Secondary | ICD-10-CM | POA: Insufficient documentation

## 2017-02-21 DIAGNOSIS — M791 Myalgia: Secondary | ICD-10-CM | POA: Diagnosis not present

## 2017-02-21 DIAGNOSIS — G9050 Complex regional pain syndrome I, unspecified: Secondary | ICD-10-CM | POA: Insufficient documentation

## 2017-02-21 MED ORDER — HYDROCODONE-ACETAMINOPHEN 7.5-325 MG PO TABS: 1.0000 | ORAL_TABLET | Freq: Four times a day (QID) | ORAL | 0 refills | Status: DC | PRN

## 2017-02-21 NOTE — Patient Instructions (Addendum)
You had a radio frequency procedure today This was done to alleviate joint pain in your lumbar area We injected a combination of dexamethasone which is a steroid as well as lidocaine which is a local anesthetic. Dexamethasone made increased blood sugars you are diabetic You may experience soreness at the injection sites. You may also experienced some irritation of the nerves that were heated I'm recommending ice for 30 minutes every 2 hours as needed for the next 24-48 hours   

## 2017-02-21 NOTE — Progress Notes (Signed)
L L4 medial branch, L L5 dorsal ramus radiofrequency ablation  Left  S1,S2,S3, neurolysis under fluoro guidance   Informed consent was obtained after discussing risks and benefits of the procedure with the patient area and this includes bleeding, bruising, infection. Patient elects to proceed and has given written consent. Patient placed in a prone position low back and buttocks area on the right side marked and prepped with Betadine. Then a 25-gauge 1.5 inch needle was used to anesthetize the skin and subcutaneous tissue with 1% lidocaine, 2 cc into each of 5 areas. Then a 10 cm RF needle with a 1 cm curved active tip was inserted first targeting the junction of the sacroiliac and S1 SAP AP and lateral images confirm proper needle location followed by  motor stimulation at 2 Hz respectively confirming proper needle location then a solution containing   1% MPF lidocaine was injected times 1 cc. RF lesioning 80C 90 seconds. Then the left L5 SAP transverse process junction was targeted needle placed at junction AP and lateral images confirm proper normal location.  motor stimulation at 2 Hz confirm proper needle location followed by injection of 1 cc of the  lidocaine solution and radiofrequency lesioning 80C 90 seconds. Then the lateral aspect of the S1-S2 and S3 foramen or targeted on the leftt side needle was placed just lateral to the foramen. Sensory stimulation showed elicited paresthesias. Followed by injection of 1 cc of the lidocaine solution at each site and radiofrequency lesioning 80C 90 seconds. Patient tolerated procedure well Post procedure instructions given 

## 2017-02-21 NOTE — Progress Notes (Signed)
  PROCEDURE RECORD [facility]   Name: Shannon French DOB:10/12/1965 MRN: [id number]  Date:02/21/2017  Physician: Claudette LawsAndrew Kirsteins, MD    Nurse/CMA: Bright CMA  Allergies:  Allergies  Allergen Reactions  . Gabapentin [Gabapentin] Swelling    Suspects allergy was to dye; capsule was yellow  . Actos [Pioglitazone Hydrochloride] Other (See Comments) and Hypertension    HEADACHES  . Esomeprazole Magnesium Hives  . Glucophage [Metformin Hydrochloride] Hives and Swelling  . Iodinated Diagnostic Agents     Other reaction(s): Other (See Comments) Unknown  . Mepivacaine Hcl Hives and Itching    carbocaine  . Other Other (See Comments)    Uncoded Allergy. Allergen: BICARBOCAINE XRAY DYE - THROAT SWELLS  . Sulfa Antibiotics Hives    Consent Signed: Yes.    Is patient diabetic? Yes.    CBG today? 132  Pregnant: No. LMP: No LMP recorded. Patient has had a hysterectomy. (age 53-55)  Anticoagulants: no Anti-inflammatory: yes (voltaren) Antibiotics: no  Procedure: Left L4-5, S1-3 RFA Position: Prone   Start Time: 248pm  End Time: 318pm  Fluoro Time: 79s  RN/CMA Bright CMA Bright CMA    Time 223pm 323pm    BP 122/82 136/81    Pulse 93 84    Respirations 16 16    O2 Sat 96 97    S/S 6 6    Pain Level 7/10 9/10     D/C home with [name], patient A & O X 3, D/C instructions reviewed, and sits independently.

## 2017-03-12 ENCOUNTER — Other Ambulatory Visit: Payer: Self-pay | Admitting: Registered Nurse

## 2017-03-18 ENCOUNTER — Other Ambulatory Visit: Payer: Self-pay | Admitting: Registered Nurse

## 2017-03-20 ENCOUNTER — Encounter: Payer: Self-pay | Admitting: Registered Nurse

## 2017-03-20 ENCOUNTER — Encounter: Payer: Medicare Other | Attending: Physical Medicine & Rehabilitation | Admitting: Registered Nurse

## 2017-03-20 VITALS — BP 110/74 | HR 88

## 2017-03-20 DIAGNOSIS — Z5181 Encounter for therapeutic drug level monitoring: Secondary | ICD-10-CM | POA: Insufficient documentation

## 2017-03-20 DIAGNOSIS — M533 Sacrococcygeal disorders, not elsewhere classified: Secondary | ICD-10-CM

## 2017-03-20 DIAGNOSIS — Z79899 Other long term (current) drug therapy: Secondary | ICD-10-CM

## 2017-03-20 DIAGNOSIS — M5010 Cervical disc disorder with radiculopathy, unspecified cervical region: Secondary | ICD-10-CM | POA: Insufficient documentation

## 2017-03-20 DIAGNOSIS — M791 Myalgia: Secondary | ICD-10-CM | POA: Insufficient documentation

## 2017-03-20 DIAGNOSIS — M25511 Pain in right shoulder: Secondary | ICD-10-CM

## 2017-03-20 DIAGNOSIS — M542 Cervicalgia: Secondary | ICD-10-CM

## 2017-03-20 DIAGNOSIS — M25512 Pain in left shoulder: Secondary | ICD-10-CM | POA: Diagnosis not present

## 2017-03-20 DIAGNOSIS — M47817 Spondylosis without myelopathy or radiculopathy, lumbosacral region: Secondary | ICD-10-CM | POA: Insufficient documentation

## 2017-03-20 DIAGNOSIS — M461 Sacroiliitis, not elsewhere classified: Secondary | ICD-10-CM | POA: Insufficient documentation

## 2017-03-20 DIAGNOSIS — G894 Chronic pain syndrome: Secondary | ICD-10-CM | POA: Diagnosis not present

## 2017-03-20 DIAGNOSIS — G8929 Other chronic pain: Secondary | ICD-10-CM

## 2017-03-20 DIAGNOSIS — M546 Pain in thoracic spine: Secondary | ICD-10-CM | POA: Diagnosis not present

## 2017-03-20 DIAGNOSIS — M609 Myositis, unspecified: Secondary | ICD-10-CM | POA: Insufficient documentation

## 2017-03-20 DIAGNOSIS — G9050 Complex regional pain syndrome I, unspecified: Secondary | ICD-10-CM | POA: Diagnosis not present

## 2017-03-20 DIAGNOSIS — M5416 Radiculopathy, lumbar region: Secondary | ICD-10-CM | POA: Insufficient documentation

## 2017-03-20 MED ORDER — HYDROCODONE-ACETAMINOPHEN 7.5-325 MG PO TABS: 1.0000 | ORAL_TABLET | Freq: Four times a day (QID) | ORAL | 0 refills | Status: DC | PRN

## 2017-03-20 NOTE — Progress Notes (Signed)
Subjective:    Patient ID: Shannon French, female    DOB: 11/06/1965, 51 y.o.   MRN: [id number]  HPI: [name] is a 51 year old female who returns for follow up appointmentfor chronic pain and medication refill. She states her pain is located in her neck, bilateral shoulders, and mid- back. She rates her pain 5. Her current exercise regime is pool therapy daily,  Walking and performing stretching exercises.  Last UDS was on 08/12/2016 was consistent.   Pain Inventory Average Pain 4 Pain Right Now 5 My pain is constant, sharp, burning, dull, stabbing, tingling and aching  In the last 24 hours, has pain interfered with the following? General activity 4 Relation with others 4 Enjoyment of life 3 What TIME of day is your pain at its worst? all Sleep (in general) Fair  Pain is worse with: walking, bending, sitting, inactivity, standing and some activites Pain improves with: rest, heat/ice, therapy/exercise, pacing activities, medication and injections Relief from Meds: 5  Mobility walk without assistance walk with assistance use a cane ability to climb steps?  yes do you drive?  yes  Function disabled: date disabled 2013  Neuro/Psych numbness tingling trouble walking spasms anxiety  Prior Studies Any changes since last visit?  no  Physicians involved in your care Any changes since last visit?  no   Family History  Problem Relation Age of Onset  . Cancer Father    Social History   Social History  . Marital status: Single    Spouse name: N/A  . Number of children: N/A  . Years of education: N/A   Social History Main Topics  . Smoking status: Former Smoker    Quit date: 10/14/2009  . Smokeless tobacco: Never Used  . Alcohol use No  . Drug use: No  . Sexual activity: Not Asked   Other Topics Concern  . None   Social History Narrative  . None   Past Surgical History:  Procedure Laterality Date  . ABDOMINAL HYSTERECTOMY    . APPENDECTOMY    .  CHOLECYSTECTOMY    . OVARY SURGERY     Past Medical History:  Diagnosis Date  . Arm pain    left  . Degenerative disc disease   . Diabetes mellitus   . Fibromyalgia   . Hyperlipidemia   . Hypertension   . IBS (irritable bowel syndrome)   . Osteoporosis    There were no vitals taken for this visit.  Opioid Risk Score:   Fall Risk Score:  `1  Depression screen PHQ 2/9  [facility] 11/12/2016 08/12/2016 12/27/2015 11/01/2015 03/20/2015 11/04/2014  Decreased Interest 0 0 0 0 0 0  Down, Depressed, Hopeless 0 0 0 0 0 0  PHQ - 2 Score 0 0 0 0 0 0  Altered sleeping - - - 0 - 3  Tired, decreased energy - - - 0 - 1  Change in appetite - - - 0 - 0  Feeling bad or failure about yourself  - - - 0 - 0  Trouble concentrating - - - 0 - 0  Moving slowly or fidgety/restless - - - 0 - 0  Suicidal thoughts - - - 0 - 0  PHQ-9 Score - - - 0 - 4     Review of Systems  Constitutional: Negative.   HENT: Negative.   Eyes: Negative.   Respiratory: Negative.   Cardiovascular: Negative.   Gastrointestinal: Negative.   Endocrine: Negative.   Genitourinary:  Negative.   Musculoskeletal: Negative.   Skin: Negative.   Allergic/Immunologic: Negative.   Neurological: Negative.   Hematological: Negative.   Psychiatric/Behavioral: Negative.   All other systems reviewed and are negative.      Objective:   Physical Exam  Constitutional: She is oriented to person, place, and time. She appears well-developed and well-nourished.  HENT:  Head: Normocephalic and atraumatic.  Neck: Normal range of motion. Neck supple.  Cardiovascular: Normal rate and regular rhythm.   Pulmonary/Chest: Effort normal and breath sounds normal.  Musculoskeletal:  Normal Muscle Bulk and  Muscle Testing Reveals: Upper Extremities: Full ROM and Muscle Strength 5/5 Thoracic Paraspinal Tenderness: T-7-T-9 Lower Extremities: Full ROM and Muscle Strength 5/5 Arises from chair with ease Narrow Based gait    Neurological: She is alert and oriented to person, place, and time.  Skin: Skin is warm and dry.  Psychiatric: She has a normal mood and affect.  Nursing note and vitals reviewed.         Assessment & Plan:  1. Bilateral sacroiliac joint dysfunction: 03/20/2017 Refilled:HYDROcodone 7.5/325mg  one tablet every 6 hrs as needed # 120.  We will continue the opioid monitoring program, this consists of regular clinic visits, examinations, urine drug screen pill counts as well as use of [place] Controlled Substance reporting System. 2. Chronic pain syndrome consistent with fibromyalgia. Continue with exercise regime, heat therapy,voltaren gel, lidocaine patches and topamax. Continue current analgesics . 03/20/2017 3. Lumbosacral Spondylosis/ Bilateral lumbar radiculopathy:Continue Elavil. 03/20/2017 4. Complex Regional Pain Syndrome: Continue Cymbalta. 03/20/2017 Dr. Yolanda Bonine Following  5. Type 2 diabetes.: PMD Following. 03/20/2017 6. Muscle Spasms: Continue Tizanidine 4 mg one tablet every 8 hours as needed for muscle spasms. 03/20/2017 7. Insomnia: Continue Elavil. 03/20/2017 8. Bilateral Greater Trochanteric Bursitis: No complaints today: Continue Heat/Ice Therapy. Continue to Monitor. 03/20/2017  20 minutes of face to face patient care time was spent during this visit. All questions were encouraged and answered.   F/U in 1 month

## 2017-03-24 ENCOUNTER — Telehealth: Payer: Self-pay | Admitting: Registered Nurse

## 2017-03-24 NOTE — Telephone Encounter (Signed)
On 03/24/2017 the  NCCSR was reviewed no conflict was seen on the [facility] Controlled Substance Reporting System with multiple prescribers. Shannon French has a signed narcotic contract with our office. If there were any discrepancies this would have been reported to her physician.

## 2017-04-25 ENCOUNTER — Encounter: Payer: Medicare Other | Attending: Physical Medicine & Rehabilitation | Admitting: Registered Nurse

## 2017-04-25 ENCOUNTER — Encounter: Payer: Self-pay | Admitting: Registered Nurse

## 2017-04-25 VITALS — BP 122/85 | HR 82 | Resp 14

## 2017-04-25 DIAGNOSIS — M5416 Radiculopathy, lumbar region: Secondary | ICD-10-CM | POA: Insufficient documentation

## 2017-04-25 DIAGNOSIS — M47817 Spondylosis without myelopathy or radiculopathy, lumbosacral region: Secondary | ICD-10-CM | POA: Diagnosis not present

## 2017-04-25 DIAGNOSIS — M5412 Radiculopathy, cervical region: Secondary | ICD-10-CM

## 2017-04-25 DIAGNOSIS — G894 Chronic pain syndrome: Secondary | ICD-10-CM | POA: Diagnosis present

## 2017-04-25 DIAGNOSIS — M7062 Trochanteric bursitis, left hip: Secondary | ICD-10-CM | POA: Diagnosis not present

## 2017-04-25 DIAGNOSIS — Z79899 Other long term (current) drug therapy: Secondary | ICD-10-CM

## 2017-04-25 DIAGNOSIS — M609 Myositis, unspecified: Secondary | ICD-10-CM | POA: Diagnosis not present

## 2017-04-25 DIAGNOSIS — M546 Pain in thoracic spine: Secondary | ICD-10-CM | POA: Diagnosis not present

## 2017-04-25 DIAGNOSIS — M5010 Cervical disc disorder with radiculopathy, unspecified cervical region: Secondary | ICD-10-CM | POA: Diagnosis not present

## 2017-04-25 DIAGNOSIS — M461 Sacroiliitis, not elsewhere classified: Secondary | ICD-10-CM | POA: Diagnosis not present

## 2017-04-25 DIAGNOSIS — M797 Fibromyalgia: Secondary | ICD-10-CM | POA: Diagnosis not present

## 2017-04-25 DIAGNOSIS — N72 Inflammatory disease of cervix uteri: Secondary | ICD-10-CM

## 2017-04-25 DIAGNOSIS — M791 Myalgia: Secondary | ICD-10-CM | POA: Insufficient documentation

## 2017-04-25 DIAGNOSIS — G9050 Complex regional pain syndrome I, unspecified: Secondary | ICD-10-CM | POA: Insufficient documentation

## 2017-04-25 DIAGNOSIS — Z5181 Encounter for therapeutic drug level monitoring: Secondary | ICD-10-CM

## 2017-04-25 MED ORDER — LIDOCAINE 5 % EX PTCH: 1.0000 | MEDICATED_PATCH | CUTANEOUS | 3 refills | Status: DC

## 2017-04-25 MED ORDER — TIZANIDINE HCL 4 MG PO TABS: 4.0000 mg | ORAL_TABLET | Freq: Three times a day (TID) | ORAL | 0 refills | Status: DC | PRN

## 2017-04-25 MED ORDER — HYDROCODONE-ACETAMINOPHEN 7.5-325 MG PO TABS: 1.0000 | ORAL_TABLET | Freq: Four times a day (QID) | ORAL | 0 refills | Status: DC | PRN

## 2017-04-25 MED ORDER — METHYLPREDNISOLONE 4 MG PO TBPK: ORAL_TABLET | ORAL | 0 refills | Status: DC

## 2017-04-25 NOTE — Progress Notes (Signed)
Subjective:    Patient ID: Shannon French, female    DOB: April 12, 1966, 51 y.o.   MRN: [id number]  HPI: Shannon French is a 51 year old female who returns for follow up appointmentfor chronic pain and medication refill. She states her pain is located in her neck radiating into her left shoulder, left arm with tingling and numbness, also mid- back and left hip pain. Also states her neck pain has increased in intensity and she noticed some redness with  inflammation. She rates her pain 9. Her current exercise regime is walking.  Last UDS was on 08/12/2016 was consistent.   Pain Inventory Average Pain 9 Pain Right Now 9 My pain is constant, sharp, burning, dull, stabbing, tingling and aching  In the last 24 hours, has pain interfered with the following? General activity 9 Relation with others 9 Enjoyment of life 9 What TIME of day is your pain at its worst? all Sleep (in general) Poor  Pain is worse with: walking, bending, sitting, inactivity, standing and some activites Pain improves with: rest, heat/ice, therapy/exercise, pacing activities and medication Relief from Meds: 5  Mobility walk without assistance walk with assistance use a cane how many minutes can you walk? 45 ability to climb steps?  yes do you drive?  yes Do you have any goals in this area?  yes  Function disabled: date disabled . Do you have any goals in this area?  yes  Neuro/Psych numbness tingling spasms anxiety  Prior Studies Any changes since last visit?  no  Physicians involved in your care Any changes since last visit?  no   Family History  Problem Relation Age of Onset  . Cancer Father    Social History   Social History  . Marital status: Single    Spouse name: N/A  . Number of children: N/A  . Years of education: N/A   Social History Main Topics  . Smoking status: Former Smoker    Quit date: 10/14/2009  . Smokeless tobacco: Never Used  . Alcohol use No  . Drug use: No  .  Sexual activity: Not Asked   Other Topics Concern  . None   Social History Narrative  . None   Past Surgical History:  Procedure Laterality Date  . ABDOMINAL HYSTERECTOMY    . APPENDECTOMY    . CHOLECYSTECTOMY    . OVARY SURGERY     Past Medical History:  Diagnosis Date  . Arm pain    left  . Degenerative disc disease   . Diabetes mellitus   . Fibromyalgia   . Hyperlipidemia   . Hypertension   . IBS (irritable bowel syndrome)   . Osteoporosis    BP 122/85 (BP Location: Right Arm, Patient Position: Sitting, Cuff Size: Normal)   Pulse 82   Resp 14   SpO2 97%   Opioid Risk Score:   Fall Risk Score:  `1  Depression screen PHQ 2/9  Depression screen [place] 2/9 11/12/2016 08/12/2016 12/27/2015 11/01/2015 03/20/2015 11/04/2014  Decreased Interest 0 0 0 0 0 0  Down, Depressed, Hopeless 0 0 0 0 0 0  PHQ - 2 Score 0 0 0 0 0 0  Altered sleeping - - - 0 - 3  Tired, decreased energy - - - 0 - 1  Change in appetite - - - 0 - 0  Feeling bad or failure about yourself  - - - 0 - 0  Trouble concentrating - - - 0 - 0  Moving slowly  or fidgety/restless - - - 0 - 0  Suicidal thoughts - - - 0 - 0  PHQ-9 Score - - - 0 - 4     Review of Systems  Constitutional: Negative.   HENT: Negative.   Eyes: Negative.   Respiratory: Negative.   Cardiovascular: Negative.   Gastrointestinal: Negative.   Endocrine: Negative.   Genitourinary: Negative.   Musculoskeletal: Positive for arthralgias, back pain, gait problem and myalgias.  Skin: Negative.   Allergic/Immunologic: Negative.   Neurological: Positive for numbness.       Tingling  Hematological: Negative.   Psychiatric/Behavioral: The patient is nervous/anxious.   All other systems reviewed and are negative.      Objective:   Physical Exam  Constitutional: She is oriented to person, place, and time. She appears well-developed and well-nourished.  HENT:  Head: Normocephalic and atraumatic.  Neck: Normal range of motion. Neck supple.    Cervical Paraspinal Tenderness: C-5-C-6  Cardiovascular: Normal rate and regular rhythm.   Pulmonary/Chest: Effort normal and breath sounds normal.  Musculoskeletal:  Normal Muscle Bulk and  Muscle Testing Reveals: Upper Extremities: Full ROM and Muscle Strength 5/5 Thoracic Inflammation: T-1-T-3 with redness Thoracic Paraspinal Tenderness: T-3-T-6 T-7-T-9 Lower Extremities: Full ROM and Muscle Strength 5/5 Left Lower Extremity Flexion Produces Pain into Lumbar Arises from chair with ease Narrow Based gait  Neurological: She is alert and oriented to person, place, and time.  Skin: Skin is warm and dry.  Psychiatric: She has a normal mood and affect.  Nursing note and vitals reviewed.         Assessment & Plan:  1. Bilateral sacroiliac joint dysfunction: 04/25/2017 Refilled:HYDROcodone 7.5/325mg  one tablet every 6 hrs as needed # 120.  We will continue the opioid monitoring program, this consists of regular clinic visits, examinations, urine drug screen pill counts as well as use of [place] Controlled Substance reporting System. 2. Chronic pain syndrome consistent with fibromyalgia. Continue with exercise regime, heat therapy,voltaren gel, lidocaine patches and topamax. Continue current analgesics . 04/25/2017 3. Lumbosacral Spondylosis/ Bilateral lumbar radiculopathy:Continue Elavil. 04/25/2017 4. Complex Regional Pain Syndrome: Continue Cymbalta. 04/25/2017 Dr. Yolanda Bonine Following  5. Type 2 diabetes.: PMD Following. 04/25/2017 6. Muscle Spasms: Continue Tizanidine 4 mg one tablet every 8 hours as needed for muscle spasms. 04/25/2017 7. Insomnia: Continue Elavil. 04/25/2017 8.Left Greater Trochanteric Bursitis: RX: Medrol Dose Pak: Continue Heat/Ice Therapy. Continue to Monitor. 04/25/2017 9. Acute Cervical Radiculopathy: Continue Elavil: 04/25/2017 10. Cervicalgia/ Cervical Inflammation: RX: Medrol Dose Pak . Refuses X-ray  11. Bilateral Thoracic Pain: Continue  current medication regime. Continue to Monitor.   20 minutes of face to face patient care time was spent during this visit. All questions were encouraged and answered.  F/U in 1 month

## 2017-04-25 NOTE — Patient Instructions (Signed)
Call office Monday 04/28/2017 to evaluate medication regimen

## 2017-04-28 ENCOUNTER — Other Ambulatory Visit: Payer: Self-pay | Admitting: Registered Nurse

## 2017-04-28 DIAGNOSIS — M797 Fibromyalgia: Secondary | ICD-10-CM

## 2017-04-29 ENCOUNTER — Telehealth: Payer: Self-pay

## 2017-04-29 NOTE — Telephone Encounter (Signed)
Patient called to let you know steroids are working and she is happy and to have a good day

## 2017-05-01 ENCOUNTER — Telehealth: Payer: Self-pay | Admitting: *Deleted

## 2017-05-01 NOTE — Telephone Encounter (Signed)
Prior Authorization approved for lidoderm patches

## 2017-05-23 ENCOUNTER — Ambulatory Visit: Payer: Medicare Other | Admitting: Physical Medicine & Rehabilitation

## 2017-05-26 ENCOUNTER — Encounter: Payer: Self-pay | Admitting: Physical Medicine & Rehabilitation

## 2017-05-26 ENCOUNTER — Encounter: Payer: Medicare Other | Attending: Physical Medicine & Rehabilitation | Admitting: Physical Medicine & Rehabilitation

## 2017-05-26 VITALS — BP 119/78 | HR 83

## 2017-05-26 DIAGNOSIS — Z9889 Other specified postprocedural states: Secondary | ICD-10-CM | POA: Diagnosis not present

## 2017-05-26 DIAGNOSIS — I10 Essential (primary) hypertension: Secondary | ICD-10-CM | POA: Insufficient documentation

## 2017-05-26 DIAGNOSIS — Z809 Family history of malignant neoplasm, unspecified: Secondary | ICD-10-CM | POA: Insufficient documentation

## 2017-05-26 DIAGNOSIS — Z9049 Acquired absence of other specified parts of digestive tract: Secondary | ICD-10-CM | POA: Diagnosis not present

## 2017-05-26 DIAGNOSIS — M609 Myositis, unspecified: Secondary | ICD-10-CM | POA: Diagnosis not present

## 2017-05-26 DIAGNOSIS — M810 Age-related osteoporosis without current pathological fracture: Secondary | ICD-10-CM | POA: Diagnosis not present

## 2017-05-26 DIAGNOSIS — G894 Chronic pain syndrome: Secondary | ICD-10-CM | POA: Diagnosis present

## 2017-05-26 DIAGNOSIS — Z5181 Encounter for therapeutic drug level monitoring: Secondary | ICD-10-CM | POA: Insufficient documentation

## 2017-05-26 DIAGNOSIS — E119 Type 2 diabetes mellitus without complications: Secondary | ICD-10-CM | POA: Insufficient documentation

## 2017-05-26 DIAGNOSIS — K589 Irritable bowel syndrome without diarrhea: Secondary | ICD-10-CM | POA: Insufficient documentation

## 2017-05-26 DIAGNOSIS — M797 Fibromyalgia: Secondary | ICD-10-CM | POA: Diagnosis not present

## 2017-05-26 DIAGNOSIS — M47817 Spondylosis without myelopathy or radiculopathy, lumbosacral region: Secondary | ICD-10-CM | POA: Diagnosis not present

## 2017-05-26 DIAGNOSIS — G9050 Complex regional pain syndrome I, unspecified: Secondary | ICD-10-CM | POA: Insufficient documentation

## 2017-05-26 DIAGNOSIS — Z90710 Acquired absence of both cervix and uterus: Secondary | ICD-10-CM | POA: Insufficient documentation

## 2017-05-26 DIAGNOSIS — Z79899 Other long term (current) drug therapy: Secondary | ICD-10-CM

## 2017-05-26 DIAGNOSIS — M5010 Cervical disc disorder with radiculopathy, unspecified cervical region: Secondary | ICD-10-CM | POA: Diagnosis not present

## 2017-05-26 DIAGNOSIS — Z87891 Personal history of nicotine dependence: Secondary | ICD-10-CM | POA: Insufficient documentation

## 2017-05-26 DIAGNOSIS — M461 Sacroiliitis, not elsewhere classified: Secondary | ICD-10-CM | POA: Insufficient documentation

## 2017-05-26 DIAGNOSIS — M5416 Radiculopathy, lumbar region: Secondary | ICD-10-CM | POA: Diagnosis not present

## 2017-05-26 DIAGNOSIS — E785 Hyperlipidemia, unspecified: Secondary | ICD-10-CM | POA: Insufficient documentation

## 2017-05-26 MED ORDER — HYDROCODONE-ACETAMINOPHEN 7.5-325 MG PO TABS: 1.0000 | ORAL_TABLET | Freq: Four times a day (QID) | ORAL | 0 refills | Status: DC | PRN

## 2017-05-26 NOTE — Progress Notes (Signed)
Subjective:    Patient ID: Shannon French, female    DOB: 10/20/1965, 51 y.o.   MRN: [id number]  HPI   Shannon French is here in follow up of her multiple pain complaints. She had good results with the SI RF's. She still has some soreness along the left hip but it's much more tolerable. She received prednisone for her cervical pain which really helped. It elevated her sugars however but they have sinced returned to normal.   [facility], exercising daily. She gets in the water at least twice per week.   She remains on vicodin for pain control which is effective. She is also using mobic daily as well as elavil and zanaflex for spasms. She continues on topamax bid for headache prophylaxis.   Pain Inventory Average Pain 6 Pain Right Now 6 My pain is constant, sharp, burning, dull, stabbing, tingling and aching  In the last 24 hours, has pain interfered with the following? General activity 6 Relation with others 6 Enjoyment of life 6 What TIME of day is your pain at its worst? all Sleep (in general) Fair  Pain is worse with: walking, bending, sitting, inactivity, standing and some activites Pain improves with: rest, heat/ice, therapy/exercise, medication and injections Relief from Meds: 5  Mobility walk without assistance walk with assistance ability to climb steps?  yes do you drive?  yes  Function disabled: date disabled 2003  Neuro/Psych numbness tingling spasms anxiety  Prior Studies Any changes since last visit?  no  Physicians involved in your care Any changes since last visit?  no   Family History  Problem Relation Age of Onset  . Cancer Father    Social History   Social History  . Marital status: Single    Spouse name: N/A  . Number of children: N/A  . Years of education: N/A   Social History Main Topics  . Smoking status: Former Smoker    Quit date: 10/14/2009  . Smokeless tobacco: Never Used  . Alcohol use No  . Drug use: No  . Sexual  activity: Not on file   Other Topics Concern  . Not on file   Social History Narrative  . No narrative on file   Past Surgical History:  Procedure Laterality Date  . ABDOMINAL HYSTERECTOMY    . APPENDECTOMY    . CHOLECYSTECTOMY    . OVARY SURGERY     Past Medical History:  Diagnosis Date  . Arm pain    left  . Degenerative disc disease   . Diabetes mellitus   . Fibromyalgia   . Hyperlipidemia   . Hypertension   . IBS (irritable bowel syndrome)   . Osteoporosis    There were no vitals taken for this visit.  Opioid Risk Score:   Fall Risk Score:  `1  Depression screen PHQ 2/9  [facility] 11/12/2016 08/12/2016 12/27/2015 11/01/2015 03/20/2015 11/04/2014  Decreased Interest 0 0 0 0 0 0  Down, Depressed, Hopeless 0 0 0 0 0 0  PHQ - 2 Score 0 0 0 0 0 0  Altered sleeping - - - 0 - 3  Tired, decreased energy - - - 0 - 1  Change in appetite - - - 0 - 0  Feeling bad or failure about yourself  - - - 0 - 0  Trouble concentrating - - - 0 - 0  Moving slowly or fidgety/restless - - - 0 - 0  Suicidal thoughts - - -  0 - 0  PHQ-9 Score - - - 0 - 4     Review of Systems  Constitutional: Negative.   HENT: Negative.   Eyes: Negative.   Respiratory: Negative.   Cardiovascular: Negative.   Gastrointestinal: Negative.   Endocrine: Negative.   Genitourinary: Negative.   Musculoskeletal: Negative.   Skin: Negative.   Allergic/Immunologic: Negative.   Neurological: Negative.   Hematological: Negative.   Psychiatric/Behavioral: Negative.   All other systems reviewed and are negative.      Objective:   Physical Exam  Constitutional: She is oriented to person, place, and time. She appears well-developed and well-nourished.  HENT: PERRL Head: Normocephalic and atraumatic.  Eyes: PERRL.  Neck: Normal range of motion. Neck supple.  Cardiovascular: RRR  Pulmonary/Chest: normal effort Abdominal: Soft.  Musculoskeletal: normal finger ROM/fingers are a little  cool. Left PSIS is still tende.r improved lumbar and cervical ROM.  Neck a little tighter with flexion  Neurological: She is alert and oriented to person, place, and time.  Reflex Scores:  DTR's are 1-2 2+ 4/5 right ADF, APF, KE, HF. 4+/5 on left. UE motor normal as is sensation today. UE DTR's are 1+.    Psychiatric: normal affect.  Assessment & Plan:  ASSESSMENT:  1. Bilateral sacroiliac joint dysfunction, status post radio  Frequencies again this summer with good results again. 2. Chronic pain syndrome consistent with fibromyalgia.  3. ?Right lumbar radiculopathy  3. Type 2 diabetes.  4. CRPS 1? LUE 5. Cervicalgia: medrol pack/ conservative care helpful.   PLAN:  1. Tizanidine for muscle spasm and sleep. Use mobic on a less regular basis, QD prn 2. Continue with desensitization exercises as she does. -maintain HEP. Encouraged [employer] use. She is doing great.  3. Hydrocodone was refilled.  7.5/325 #120. We will continue the opioid monitoring program, this consists of regular clinic visits, examinations, routine drug screening, pill counts as well as use of [place] Controlled Substance Reporting System. NCCSRS was reviewed today.   -Drug swab today  4. No SNB's indicated at this point. 5. Continue elavill 10-20mg  qhs for sleep/neuro pain---continue cymbalta at 90mg              -continues on topamax as well. This could be contributing to paresthesias given her weight loss.---consider trokendi trial 6. Follow up with NP in about one month. 15 minutes of face to face patient care time were spent during this visit. All questions were encouraged and answered.

## 2017-05-26 NOTE — Patient Instructions (Signed)
PLEASE FEEL FREE TO CALL OUR OFFICE WITH ANY PROBLEMS OR QUESTIONS ([phone])      

## 2017-06-01 LAB — DRUG TOX MONITOR 1 W/CONF, ORAL FLD
AMPHETAMINES: NEGATIVE ng/mL (ref <10)
Barbiturates: NEGATIVE ng/mL (ref <10)
Benzodiazepines: NEGATIVE ng/mL (ref <0.50)
Buprenorphine: NEGATIVE ng/mL (ref <0.10)
COTININE: 73 ng/mL — AB (ref <5.0)
Cocaine: NEGATIVE ng/mL (ref <5.0)
Codeine: NEGATIVE ng/mL (ref <2.5)
DIHYDROCODEINE: 9.5 ng/mL — AB (ref <2.5)
Fentanyl: NEGATIVE ng/mL (ref <0.10)
HEROIN METABOLITE: NEGATIVE ng/mL (ref <1.0)
HYDROCODONE: 180.1 ng/mL — AB (ref <2.5)
Hydromorphone: NEGATIVE ng/mL (ref <2.5)
MARIJUANA: NEGATIVE ng/mL (ref <2.5)
MDMA: NEGATIVE ng/mL (ref <10)
Meprobamate: NEGATIVE ng/mL (ref <2.5)
Methadone: NEGATIVE ng/mL (ref <5.0)
Morphine: NEGATIVE ng/mL (ref <2.5)
NORHYDROCODONE: 6.9 ng/mL — AB (ref <2.5)
Nicotine Metabolite: POSITIVE ng/mL — AB (ref <5.0)
Noroxycodone: NEGATIVE ng/mL (ref <2.5)
OPIATES: POSITIVE ng/mL — AB (ref <2.5)
OXYMORPHONE: NEGATIVE ng/mL (ref <2.5)
Oxycodone: NEGATIVE ng/mL (ref <2.5)
PHENCYCLIDINE: NEGATIVE ng/mL (ref <10)
TRAMADOL: NEGATIVE ng/mL (ref <5.0)
Tapentadol: NEGATIVE ng/mL (ref <5.0)
ZOLPIDEM: NEGATIVE ng/mL (ref <5.0)

## 2017-06-01 LAB — DRUG TOX ALC METAB W/CON, ORAL FLD: Alcohol Metabolite: NEGATIVE ng/mL (ref <25)

## 2017-06-05 ENCOUNTER — Telehealth: Payer: Self-pay | Admitting: *Deleted

## 2017-06-05 NOTE — Telephone Encounter (Signed)
Oral swab drug screen was consistent for prescribed medications.  ?

## 2017-06-30 ENCOUNTER — Encounter: Payer: Medicare Other | Attending: Physical Medicine & Rehabilitation | Admitting: Registered Nurse

## 2017-06-30 ENCOUNTER — Encounter: Payer: Self-pay | Admitting: Registered Nurse

## 2017-06-30 VITALS — BP 126/80 | HR 93

## 2017-06-30 DIAGNOSIS — G9050 Complex regional pain syndrome I, unspecified: Secondary | ICD-10-CM | POA: Diagnosis not present

## 2017-06-30 DIAGNOSIS — M461 Sacroiliitis, not elsewhere classified: Secondary | ICD-10-CM | POA: Insufficient documentation

## 2017-06-30 DIAGNOSIS — M810 Age-related osteoporosis without current pathological fracture: Secondary | ICD-10-CM | POA: Insufficient documentation

## 2017-06-30 DIAGNOSIS — M546 Pain in thoracic spine: Secondary | ICD-10-CM | POA: Diagnosis not present

## 2017-06-30 DIAGNOSIS — N72 Inflammatory disease of cervix uteri: Secondary | ICD-10-CM | POA: Diagnosis not present

## 2017-06-30 DIAGNOSIS — M5010 Cervical disc disorder with radiculopathy, unspecified cervical region: Secondary | ICD-10-CM | POA: Insufficient documentation

## 2017-06-30 DIAGNOSIS — E119 Type 2 diabetes mellitus without complications: Secondary | ICD-10-CM | POA: Insufficient documentation

## 2017-06-30 DIAGNOSIS — I10 Essential (primary) hypertension: Secondary | ICD-10-CM | POA: Diagnosis not present

## 2017-06-30 DIAGNOSIS — Z809 Family history of malignant neoplasm, unspecified: Secondary | ICD-10-CM | POA: Diagnosis not present

## 2017-06-30 DIAGNOSIS — K589 Irritable bowel syndrome without diarrhea: Secondary | ICD-10-CM | POA: Insufficient documentation

## 2017-06-30 DIAGNOSIS — M542 Cervicalgia: Secondary | ICD-10-CM

## 2017-06-30 DIAGNOSIS — Z79899 Other long term (current) drug therapy: Secondary | ICD-10-CM

## 2017-06-30 DIAGNOSIS — M609 Myositis, unspecified: Secondary | ICD-10-CM | POA: Diagnosis not present

## 2017-06-30 DIAGNOSIS — M5416 Radiculopathy, lumbar region: Secondary | ICD-10-CM | POA: Diagnosis not present

## 2017-06-30 DIAGNOSIS — Z87891 Personal history of nicotine dependence: Secondary | ICD-10-CM | POA: Diagnosis not present

## 2017-06-30 DIAGNOSIS — Z9889 Other specified postprocedural states: Secondary | ICD-10-CM | POA: Insufficient documentation

## 2017-06-30 DIAGNOSIS — M47817 Spondylosis without myelopathy or radiculopathy, lumbosacral region: Secondary | ICD-10-CM | POA: Diagnosis not present

## 2017-06-30 DIAGNOSIS — Z90710 Acquired absence of both cervix and uterus: Secondary | ICD-10-CM | POA: Diagnosis not present

## 2017-06-30 DIAGNOSIS — Z9049 Acquired absence of other specified parts of digestive tract: Secondary | ICD-10-CM | POA: Diagnosis not present

## 2017-06-30 DIAGNOSIS — G894 Chronic pain syndrome: Secondary | ICD-10-CM | POA: Diagnosis not present

## 2017-06-30 DIAGNOSIS — M797 Fibromyalgia: Secondary | ICD-10-CM | POA: Diagnosis not present

## 2017-06-30 DIAGNOSIS — G8929 Other chronic pain: Secondary | ICD-10-CM | POA: Diagnosis not present

## 2017-06-30 DIAGNOSIS — Z5181 Encounter for therapeutic drug level monitoring: Secondary | ICD-10-CM | POA: Diagnosis not present

## 2017-06-30 DIAGNOSIS — M25512 Pain in left shoulder: Secondary | ICD-10-CM

## 2017-06-30 DIAGNOSIS — E785 Hyperlipidemia, unspecified: Secondary | ICD-10-CM | POA: Insufficient documentation

## 2017-06-30 MED ORDER — DULOXETINE HCL 30 MG PO CPEP: ORAL_CAPSULE | ORAL | 0 refills | Status: DC

## 2017-06-30 MED ORDER — HYDROCODONE-ACETAMINOPHEN 7.5-325 MG PO TABS: 1.0000 | ORAL_TABLET | Freq: Four times a day (QID) | ORAL | 0 refills | Status: DC | PRN

## 2017-06-30 MED ORDER — DULOXETINE HCL 60 MG PO CPEP: ORAL_CAPSULE | ORAL | 0 refills | Status: DC

## 2017-06-30 NOTE — Progress Notes (Signed)
Subjective:    Patient ID: Shannon EaringSally Truxillo, female    DOB: 03/19/1966, 51 y.o.   MRN: [id number]  HPI: [name] is a 51 year old female who returns for follow up appointmentfor chronic pain and medication refill. She states her pain is located in her neck radiating into her left shoulder, left arm with tingling and numbness, and upper-mid- back pain. Also states her neck pain has increased in intensity since last  week, it started when she was putting clothes in a drawer she reports. Denies falling, we will continue current medication regimen, encouraged to alternate with heat and ice therapy. Cervical and left shoulder X-ray ordered, awaiting results.  She rates her pain 9. Her current exercise regime is walking  Last UDS was on 08/12/2016 was consistent.   Pain Inventory Average Pain 6 Pain Right Now 6 My pain is constant, sharp, burning, dull, stabbing, tingling and aching  In the last 24 hours, has pain interfered with the following? General activity 6 Relation with others 7 Enjoyment of life 7 What TIME of day is your pain at its worst? all Sleep (in general) Poor  Pain is worse with: walking, bending, sitting, inactivity, standing and some activites Pain improves with: rest, heat/ice, therapy/exercise, pacing activities and medication Relief from Meds: 5  Mobility walk without assistance walk with assistance use a cane how many minutes can you walk? 45 ability to climb steps?  yes do you drive?  yes Do you have any goals in this area?  yes  Function disabled: date disabled . Do you have any goals in this area?  yes  Neuro/Psych numbness tingling spasms anxiety  Prior Studies Any changes since last visit?  no  Physicians involved in your care Any changes since last visit?  no   Family History  Problem Relation Age of Onset  . Cancer Father    Social History   Socioeconomic History  . Marital status: Single    Spouse name: Not on file  . Number  of children: Not on file  . Years of education: Not on file  . Highest education level: Not on file  Social Needs  . Financial resource strain: Not on file  . Food insecurity - worry: Not on file  . Food insecurity - inability: Not on file  . Transportation needs - medical: Not on file  . Transportation needs - non-medical: Not on file  Occupational History  . Not on file  Tobacco Use  . Smoking status: Former Smoker    Last attempt to quit: 10/14/2009    Years since quitting: 7.7  . Smokeless tobacco: Never Used  Substance and Sexual Activity  . Alcohol use: No  . Drug use: No  . Sexual activity: Not on file  Other Topics Concern  . Not on file  Social History Narrative  . Not on file   Past Surgical History:  Procedure Laterality Date  . ABDOMINAL HYSTERECTOMY    . APPENDECTOMY    . CHOLECYSTECTOMY    . OVARY SURGERY     Past Medical History:  Diagnosis Date  . Arm pain    left  . Degenerative disc disease   . Diabetes mellitus   . Fibromyalgia   . Hyperlipidemia   . Hypertension   . IBS (irritable bowel syndrome)   . Osteoporosis    There were no vitals taken for this visit.  Opioid Risk Score:   Fall Risk Score:  `1  Depression screen [facility] 2/9  Depression screen [facility] 2/9 11/12/2016 08/12/2016 12/27/2015 11/01/2015 03/20/2015 11/04/2014  Decreased Interest 0 0 0 0 0 0  Down, Depressed, Hopeless 0 0 0 0 0 0  PHQ - 2 Score 0 0 0 0 0 0  Altered sleeping - - - 0 - 3  Tired, decreased energy - - - 0 - 1  Change in appetite - - - 0 - 0  Feeling bad or failure about yourself  - - - 0 - 0  Trouble concentrating - - - 0 - 0  Moving slowly or fidgety/restless - - - 0 - 0  Suicidal thoughts - - - 0 - 0  PHQ-9 Score - - - 0 - 4     Review of Systems  Constitutional: Negative.   HENT: Negative.   Eyes: Negative.   Respiratory: Negative.   Cardiovascular: Negative.   Gastrointestinal: Negative.   Endocrine: Negative.   Genitourinary: Negative.   Musculoskeletal:  Positive for arthralgias, back pain, gait problem and myalgias.  Skin: Negative.   Allergic/Immunologic: Negative.   Neurological: Positive for numbness.       Tingling  Hematological: Negative.   Psychiatric/Behavioral: The patient is nervous/anxious.   All other systems reviewed and are negative.      Objective:   Physical Exam  Constitutional: She is oriented to person, place, and time. She appears well-developed and well-nourished.  HENT:  Head: Normocephalic and atraumatic.  Neck: Normal range of motion. Neck supple.  Cervical Paraspinal Tenderness: C-5-C-6  Cardiovascular: Normal rate and regular rhythm.  Pulmonary/Chest: Effort normal and breath sounds normal.  Musculoskeletal:  Normal Muscle Bulk and  Muscle Testing Reveals: Upper Extremities: Right: Full ROM and Muscle Strength 5/5 Left: Decreased ROM 45 Degrees and Muscle Strength 4/5 Thoracic Inflammation: T-1-T- Thoracic Paraspinal Tenderness: T-7-T-9 Lower Extremities: Full ROM and Muscle Strength 5/5 Left Lower Extremity Flexion Produces Pain into Lumbar Arises from chair with ease Narrow Based gait  Neurological: She is alert and oriented to person, place, and time.  Skin: Skin is warm and dry.  Psychiatric: She has a normal mood and affect.  Nursing note and vitals reviewed.         Assessment & Plan:  1. Bilateral sacroiliac joint dysfunction: 06/30/2017 Refilled:HYDROcodone 7.5/325mg  one tablet every 6 hrs as needed # 120.  We will continue the opioid monitoring program, this consists of regular clinic visits, examinations, urine drug screen pill counts as well as [place] Cont[place]. 2. Chronic pain syndrome consistent with fibromyalgia. Continue with exercise regime, heat therapy,voltaren gel, lidocaine patches and topamax. Continue current analgesics . 06/30/2017 3. Lumbosacral Spondylosis/ Bilateral lumbar radiculopathy:Continue Elavil and Cymbalta.  06/30/2017 4. Complex Regional Pain Syndrome: Continue Cymbalta. 06/30/2017 Dr. Yolanda BonineBertrand Following  5. Type 2 diabetes.: PMD Following. 06/30/2017 6. Muscle Spasms: Continue Tizanidine 4 mg one tablet every 8 hours as needed for muscle spasms. 06/30/2017 7. Insomnia: Continue Elavil. 06/30/2017 8.Cervical Radiculopathy: Continue Elavil: 06/30/2017 9. Cervicalgia/ Cervical Inflammation: Cervical X-ray  10. Bilateral Thoracic Pain: Continue current medication regime. Continue to Monitor 11. Left Shoulder Pain: RX: Left Shoulder X-ray   20 minutes of face to face patient care time was spent during this visit. All questions were encouraged and answered.  F/U in 1 month

## 2017-07-22 ENCOUNTER — Other Ambulatory Visit: Payer: Self-pay

## 2017-07-22 ENCOUNTER — Encounter: Payer: Self-pay | Admitting: Registered Nurse

## 2017-07-22 ENCOUNTER — Telehealth: Payer: Self-pay | Admitting: Registered Nurse

## 2017-07-22 ENCOUNTER — Encounter: Payer: Medicare Other | Attending: Physical Medicine & Rehabilitation | Admitting: Registered Nurse

## 2017-07-22 VITALS — BP 106/79 | HR 85

## 2017-07-22 DIAGNOSIS — Z9889 Other specified postprocedural states: Secondary | ICD-10-CM | POA: Insufficient documentation

## 2017-07-22 DIAGNOSIS — G609 Hereditary and idiopathic neuropathy, unspecified: Secondary | ICD-10-CM | POA: Diagnosis not present

## 2017-07-22 DIAGNOSIS — M47817 Spondylosis without myelopathy or radiculopathy, lumbosacral region: Secondary | ICD-10-CM | POA: Diagnosis not present

## 2017-07-22 DIAGNOSIS — Z87891 Personal history of nicotine dependence: Secondary | ICD-10-CM | POA: Diagnosis not present

## 2017-07-22 DIAGNOSIS — E119 Type 2 diabetes mellitus without complications: Secondary | ICD-10-CM | POA: Insufficient documentation

## 2017-07-22 DIAGNOSIS — M5010 Cervical disc disorder with radiculopathy, unspecified cervical region: Secondary | ICD-10-CM

## 2017-07-22 DIAGNOSIS — M461 Sacroiliitis, not elsewhere classified: Secondary | ICD-10-CM | POA: Diagnosis not present

## 2017-07-22 DIAGNOSIS — Z9049 Acquired absence of other specified parts of digestive tract: Secondary | ICD-10-CM | POA: Diagnosis not present

## 2017-07-22 DIAGNOSIS — G894 Chronic pain syndrome: Secondary | ICD-10-CM

## 2017-07-22 DIAGNOSIS — M609 Myositis, unspecified: Secondary | ICD-10-CM | POA: Insufficient documentation

## 2017-07-22 DIAGNOSIS — Z5181 Encounter for therapeutic drug level monitoring: Secondary | ICD-10-CM

## 2017-07-22 DIAGNOSIS — E785 Hyperlipidemia, unspecified: Secondary | ICD-10-CM | POA: Insufficient documentation

## 2017-07-22 DIAGNOSIS — G9050 Complex regional pain syndrome I, unspecified: Secondary | ICD-10-CM | POA: Insufficient documentation

## 2017-07-22 DIAGNOSIS — M810 Age-related osteoporosis without current pathological fracture: Secondary | ICD-10-CM | POA: Insufficient documentation

## 2017-07-22 DIAGNOSIS — M546 Pain in thoracic spine: Secondary | ICD-10-CM

## 2017-07-22 DIAGNOSIS — Z79899 Other long term (current) drug therapy: Secondary | ICD-10-CM | POA: Diagnosis not present

## 2017-07-22 DIAGNOSIS — I10 Essential (primary) hypertension: Secondary | ICD-10-CM | POA: Diagnosis not present

## 2017-07-22 DIAGNOSIS — Z90710 Acquired absence of both cervix and uterus: Secondary | ICD-10-CM | POA: Insufficient documentation

## 2017-07-22 DIAGNOSIS — Z809 Family history of malignant neoplasm, unspecified: Secondary | ICD-10-CM | POA: Insufficient documentation

## 2017-07-22 DIAGNOSIS — M542 Cervicalgia: Secondary | ICD-10-CM

## 2017-07-22 DIAGNOSIS — G8929 Other chronic pain: Secondary | ICD-10-CM

## 2017-07-22 DIAGNOSIS — M797 Fibromyalgia: Secondary | ICD-10-CM

## 2017-07-22 DIAGNOSIS — M25512 Pain in left shoulder: Secondary | ICD-10-CM

## 2017-07-22 DIAGNOSIS — K589 Irritable bowel syndrome without diarrhea: Secondary | ICD-10-CM | POA: Diagnosis not present

## 2017-07-22 DIAGNOSIS — M5416 Radiculopathy, lumbar region: Secondary | ICD-10-CM | POA: Diagnosis not present

## 2017-07-22 MED ORDER — DULOXETINE HCL 60 MG PO CPEP: ORAL_CAPSULE | ORAL | 0 refills | Status: DC

## 2017-07-22 MED ORDER — DULOXETINE HCL 30 MG PO CPEP: ORAL_CAPSULE | ORAL | 0 refills | Status: DC

## 2017-07-22 MED ORDER — HYDROCODONE-ACETAMINOPHEN 7.5-325 MG PO TABS: 1.0000 | ORAL_TABLET | Freq: Four times a day (QID) | ORAL | 0 refills | Status: DC | PRN

## 2017-07-22 NOTE — Telephone Encounter (Signed)
On 07/22/2017 the NCCSR was reviewed no conflict was seen on the [facility]  Cont[facility] Substance Reporting System with multiple prescribers. [name] has a signed narcotic contract with our office. If there were any discrepancies this would have been reported to her physician.

## 2017-07-22 NOTE — Progress Notes (Signed)
Subjective:    Patient ID: Shannon French, female    DOB: 04/11/1966, 51 y.o.   MRN: [id number]  HPI: Shannon French is a 51 year old female who returns for follow up appointmentfor chronic pain and medication refill. She states her pain is located in her neck radiating into her left shoulder, left arm with tingling and numbness, and mid- back pain. She rates her pain 7. Her current exercise regime is walking and attending the Gym three times a week.   Shannon French Morphine equivalent is 30.00 MME  Oral Swab  Was performed  on 05/26/2017 was consistent.   Pain Inventory Average Pain 6 Pain Right Now 7 My pain is constant, sharp, burning, dull, stabbing, tingling and aching  In the last 24 hours, has pain interfered with the following? General activity 5 Relation with others 5 Enjoyment of life 5 What TIME of day is your pain at its worst? all Sleep (in general) Fair  Pain is worse with: walking, bending, sitting, inactivity, standing and some activites Pain improves with: rest, heat/ice, therapy/exercise, pacing activities, medication and injections Relief from Meds: 4  Mobility walk without assistance walk with assistance use a cane how many minutes can you walk? 30 ability to climb steps?  yes do you drive?  yes Do you have any goals in this area?  yes  Function disabled: date disabled 2003 I need assistance with the following:  meal prep, household duties and shopping Do you have any goals in this area?  yes  Neuro/Psych weakness numbness tingling spasms anxiety  Prior Studies Any changes since last visit?  no  Physicians involved in your care Any changes since last visit?  no   Family History  Problem Relation Age of Onset  . Cancer Father    Social History   Socioeconomic History  . Marital status: Single    Spouse name: None  . Number of children: None  . Years of education: None  . Highest education level: None  Social Needs  . Financial  resource strain: None  . Food insecurity - worry: None  . Food insecurity - inability: None  . Transportation needs - medical: None  . Transportation needs - non-medical: None  Occupational History  . None  Tobacco Use  . Smoking status: Former Smoker    Last attempt to quit: 10/14/2009    Years since quitting: 7.7  . Smokeless tobacco: Never Used  Substance and Sexual Activity  . Alcohol use: No  . Drug use: No  . Sexual activity: None  Other Topics Concern  . None  Social History Narrative  . None   Past Surgical History:  Procedure Laterality Date  . ABDOMINAL HYSTERECTOMY    . APPENDECTOMY    . CHOLECYSTECTOMY    . OVARY SURGERY     Past Medical History:  Diagnosis Date  . Arm pain    left  . Degenerative disc disease   . Diabetes mellitus   . Fibromyalgia   . Hyperlipidemia   . Hypertension   . IBS (irritable bowel syndrome)   . Osteoporosis    BP 106/79   Pulse 85   SpO2 98%   Opioid Risk Score:   Fall Risk Score:  `1  Depression screen PHQ 2/9  Depression screen [facility] 2/9 07/22/2017 11/12/2016 08/12/2016 12/27/2015 11/01/2015 03/20/2015 11/04/2014  Decreased Interest 0 0 0 0 0 0 0  Down, Depressed, Hopeless 0 0 0 0 0 0 0  PHQ - 2 Score  0 0 0 0 0 0 0  Altered sleeping - - - - 0 - 3  Tired, decreased energy - - - - 0 - 1  Change in appetite - - - - 0 - 0  Feeling bad or failure about yourself  - - - - 0 - 0  Trouble concentrating - - - - 0 - 0  Moving slowly or fidgety/restless - - - - 0 - 0  Suicidal thoughts - - - - 0 - 0  PHQ-9 Score - - - - 0 - 4     Review of Systems  Constitutional: Negative.   HENT: Negative.   Eyes: Negative.   Respiratory: Negative.   Cardiovascular: Negative.   Gastrointestinal: Negative.   Endocrine: Negative.   Genitourinary: Negative.   Musculoskeletal: Positive for arthralgias, back pain, gait problem and myalgias.  Skin: Negative.   Allergic/Immunologic: Negative.   Neurological: Positive for numbness.        Tingling  Hematological: Negative.   Psychiatric/Behavioral: The patient is nervous/anxious.   All other systems reviewed and are negative.      Objective:   Physical Exam  Constitutional: She is oriented to person, place, and time. She appears well-developed and well-nourished.  HENT:  Head: Normocephalic and atraumatic.  Neck: Normal range of motion. Neck supple.  Cervical Paraspinal Tenderness: C-5-C-6  Cardiovascular: Normal rate and regular rhythm.  Pulmonary/Chest: Effort normal and breath sounds normal.  Musculoskeletal:  Normal Muscle Bulk and  Muscle Testing Reveals: Upper Extremities: Right: Full ROM and Muscle Strength 5/5 Left: Decreased ROM 90 Degrees and Muscle Strength 4/5 Left AC Joint Tenderness Thoracic Paraspinal Tenderness: T-1-T-3 Mainly Left Side and  T-7-T-9 Lower Extremities: Full ROM and Muscle Strength 5/5 Arises from chair with ease Narrow Based gait  Neurological: She is alert and oriented to person, place, and time.  Skin: Skin is warm and dry.  Psychiatric: She has a normal mood and affect.  Nursing note and vitals reviewed.         Assessment & Plan:  1. Bilateral sacroiliac joint dysfunction: 07/22/2017 Refilled:HYDROcodone 7.5/325mg  one tablet every 6 hrs as needed # 120.  We will continue the opioid monitoring program, this consists of regular clinic visits, examinations, urine drug screen pill counts as well as use of [place]. 2. Chronic pain syndrome consistent with fibromyalgia. Continue with exercise regime, heat therapy,voltaren gel, lidocaine patches and topamax. Continue current analgesics . 07/22/2017 3. Lumbosacral Spondylosis/ Bilateral lumbar radiculopathy:Continue Elavil and Cymbalta. 07/22/2017 4. Complex Regional Pain Syndrome: Continue Cymbalta. 07/22/2017 Dr. Yolanda BonineBertrand Following  5. Type 2 diabetes.: PMD Following. 07/22/2017 6. Muscle Spasms: Continue Tizanidine 4 mg one  tablet every 8 hours as needed for muscle spasms. 07/22/2017 7. Insomnia: Continue Elavil. 06/30/2017 8.Cervical Radiculopathy: Continue Elavil: 07/22/2017 9. Cervicalgia/ Cervical Inflammation: Continue current medication regime. Alternate with heat and Ice Therapy. 07/22/2017 10. Bilateral Thoracic Pain: Continue current medication regime. Continue to Monitor. 07/22/2017 11. Left Shoulder Pain: RX: Left Shoulder X-ray. Pt reports she will obtain X-ray after the holidays. 07/22/2017   20 minutes of face to face patient care time was spent during this visit. All questions were encouraged and answered.  F/U in 1 month

## 2017-07-23 ENCOUNTER — Encounter: Payer: Medicare Other | Admitting: Registered Nurse

## 2017-07-31 ENCOUNTER — Other Ambulatory Visit: Payer: Self-pay | Admitting: Registered Nurse

## 2017-07-31 DIAGNOSIS — M797 Fibromyalgia: Secondary | ICD-10-CM

## 2017-08-08 ENCOUNTER — Telehealth: Payer: Self-pay | Admitting: *Deleted

## 2017-08-08 DIAGNOSIS — M47817 Spondylosis without myelopathy or radiculopathy, lumbosacral region: Secondary | ICD-10-CM

## 2017-08-08 DIAGNOSIS — M797 Fibromyalgia: Secondary | ICD-10-CM

## 2017-08-08 MED ORDER — TIZANIDINE HCL 4 MG PO TABS: 4.0000 mg | ORAL_TABLET | Freq: Three times a day (TID) | ORAL | 1 refills | Status: DC | PRN

## 2017-08-08 NOTE — Telephone Encounter (Signed)
refill 

## 2017-08-25 ENCOUNTER — Encounter: Payer: Self-pay | Admitting: Registered Nurse

## 2017-08-25 ENCOUNTER — Encounter: Payer: Medicare Other | Attending: Physical Medicine & Rehabilitation | Admitting: Registered Nurse

## 2017-08-25 VITALS — BP 114/82 | HR 83

## 2017-08-25 DIAGNOSIS — E119 Type 2 diabetes mellitus without complications: Secondary | ICD-10-CM | POA: Diagnosis not present

## 2017-08-25 DIAGNOSIS — M797 Fibromyalgia: Secondary | ICD-10-CM | POA: Diagnosis not present

## 2017-08-25 DIAGNOSIS — M5010 Cervical disc disorder with radiculopathy, unspecified cervical region: Secondary | ICD-10-CM

## 2017-08-25 DIAGNOSIS — M47817 Spondylosis without myelopathy or radiculopathy, lumbosacral region: Secondary | ICD-10-CM | POA: Diagnosis not present

## 2017-08-25 DIAGNOSIS — I10 Essential (primary) hypertension: Secondary | ICD-10-CM | POA: Insufficient documentation

## 2017-08-25 DIAGNOSIS — E785 Hyperlipidemia, unspecified: Secondary | ICD-10-CM | POA: Diagnosis not present

## 2017-08-25 DIAGNOSIS — M609 Myositis, unspecified: Secondary | ICD-10-CM | POA: Diagnosis not present

## 2017-08-25 DIAGNOSIS — Z809 Family history of malignant neoplasm, unspecified: Secondary | ICD-10-CM | POA: Diagnosis not present

## 2017-08-25 DIAGNOSIS — K589 Irritable bowel syndrome without diarrhea: Secondary | ICD-10-CM | POA: Diagnosis not present

## 2017-08-25 DIAGNOSIS — Z5181 Encounter for therapeutic drug level monitoring: Secondary | ICD-10-CM | POA: Diagnosis not present

## 2017-08-25 DIAGNOSIS — G609 Hereditary and idiopathic neuropathy, unspecified: Secondary | ICD-10-CM

## 2017-08-25 DIAGNOSIS — G8929 Other chronic pain: Secondary | ICD-10-CM | POA: Diagnosis not present

## 2017-08-25 DIAGNOSIS — Z90710 Acquired absence of both cervix and uterus: Secondary | ICD-10-CM | POA: Diagnosis not present

## 2017-08-25 DIAGNOSIS — M25512 Pain in left shoulder: Secondary | ICD-10-CM

## 2017-08-25 DIAGNOSIS — M810 Age-related osteoporosis without current pathological fracture: Secondary | ICD-10-CM | POA: Diagnosis not present

## 2017-08-25 DIAGNOSIS — M542 Cervicalgia: Secondary | ICD-10-CM | POA: Diagnosis not present

## 2017-08-25 DIAGNOSIS — M461 Sacroiliitis, not elsewhere classified: Secondary | ICD-10-CM | POA: Insufficient documentation

## 2017-08-25 DIAGNOSIS — Z9049 Acquired absence of other specified parts of digestive tract: Secondary | ICD-10-CM | POA: Diagnosis not present

## 2017-08-25 DIAGNOSIS — Z9889 Other specified postprocedural states: Secondary | ICD-10-CM | POA: Insufficient documentation

## 2017-08-25 DIAGNOSIS — M5416 Radiculopathy, lumbar region: Secondary | ICD-10-CM | POA: Diagnosis not present

## 2017-08-25 DIAGNOSIS — G9050 Complex regional pain syndrome I, unspecified: Secondary | ICD-10-CM | POA: Diagnosis not present

## 2017-08-25 DIAGNOSIS — Z87891 Personal history of nicotine dependence: Secondary | ICD-10-CM | POA: Diagnosis not present

## 2017-08-25 DIAGNOSIS — Z79899 Other long term (current) drug therapy: Secondary | ICD-10-CM | POA: Diagnosis not present

## 2017-08-25 DIAGNOSIS — M546 Pain in thoracic spine: Secondary | ICD-10-CM | POA: Diagnosis not present

## 2017-08-25 DIAGNOSIS — G894 Chronic pain syndrome: Secondary | ICD-10-CM | POA: Diagnosis not present

## 2017-08-25 MED ORDER — HYDROCODONE-ACETAMINOPHEN 7.5-325 MG PO TABS: 1.0000 | ORAL_TABLET | Freq: Four times a day (QID) | ORAL | 0 refills | Status: DC | PRN

## 2017-08-25 NOTE — Patient Instructions (Addendum)
Increase Amitriptyline to 25 mg tablets ( use your 10 mg tablets, that would be 2 1/2 tablets) Call office in a   week to evaluate. [phone]  If you can tolerate the 25 mg, will prescribe the 25mg  dose.

## 2017-08-25 NOTE — Progress Notes (Signed)
Subjective:    Patient ID: Shannon French, female    DOB: Nov 16, 1965, 52 y.o.   MRN: [id number]  HPI: [name] is a 52 year old female who returns for follow up appointmentfor chronic pain and medication refill. She states her pain is located in her neck radiating into her left shoulder and left arm with tingling and numbness, also reports mid back pain. [name] states she's having increase intensity of neuropathic pain and difficulty with holding cups in her hand, we will order a EMG, she verbalizes understanding. Also will order Cervical and Left Shoulder X-ray, she verbalizes understanding. She rates her pain 8. Her current exercise regime is walking.    [name] Morphine equivalent is 31.00 MME  Oral Swab  Was performed  on 05/26/2017 was consistent.   Pain Inventory Average Pain 8 Pain Right Now 8 My pain is constant, sharp, burning, dull, stabbing, tingling and aching  In the last 24 hours, has pain interfered with the following? General activity 8 Relation with others 8 Enjoyment of life 8 What TIME of day is your pain at its worst? all Sleep (in general) Fair  Pain is worse with: walking, bending, sitting, inactivity, standing and some activites Pain improves with: rest, heat/ice, therapy/exercise, pacing activities, medication and injections Relief from Meds: 5  Mobility walk without assistance walk with assistance use a cane how many minutes can you walk? 30 ability to climb steps?  yes do you drive?  yes Do you have any goals in this area?  yes  Function disabled: date disabled 2003 I need assistance with the following:  meal prep, household duties and shopping Do you have any goals in this area?  yes  Neuro/Psych weakness numbness tingling spasms anxiety  Prior Studies Any changes since last visit?  no  Physicians involved in your care Any changes since last visit?  no   Family History  Problem Relation Age of Onset  . Cancer Father      Social History   Socioeconomic History  . Marital status: Single    Spouse name: Not on file  . Number of children: Not on file  . Years of education: Not on file  . Highest education level: Not on file  Social Needs  . Financial resource strain: Not on file  . Food insecurity - worry: Not on file  . Food insecurity - inability: Not on file  . Transportation needs - medical: Not on file  . Transportation needs - non-medical: Not on file  Occupational History  . Not on file  Tobacco Use  . Smoking status: Former Smoker    Last attempt to quit: 10/14/2009    Years since quitting: 7.8  . Smokeless tobacco: Never Used  Substance and Sexual Activity  . Alcohol use: No  . Drug use: No  . Sexual activity: Not on file  Other Topics Concern  . Not on file  Social History Narrative  . Not on file   Past Surgical History:  Procedure Laterality Date  . ABDOMINAL HYSTERECTOMY    . APPENDECTOMY    . CHOLECYSTECTOMY    . OVARY SURGERY     Past Medical History:  Diagnosis Date  . Arm pain    left  . Degenerative disc disease   . Diabetes mellitus   . Fibromyalgia   . Hyperlipidemia   . Hypertension   . IBS (irritable bowel syndrome)   . Osteoporosis    There were no vitals taken for  this visit.  Opioid Risk Score:   Fall Risk Score:  `1  Depression screen PHQ 2/9  [facility] 07/22/2017 11/12/2016 08/12/2016 12/27/2015 11/01/2015 03/20/2015 11/04/2014  Decreased Interest 0 0 0 0 0 0 0  Down, Depressed, Hopeless 0 0 0 0 0 0 0  PHQ - 2 Score 0 0 0 0 0 0 0  Altered sleeping - - - - 0 - 3  Tired, decreased energy - - - - 0 - 1  Change in appetite - - - - 0 - 0  Feeling bad or failure about yourself  - - - - 0 - 0  Trouble concentrating - - - - 0 - 0  Moving slowly or fidgety/restless - - - - 0 - 0  Suicidal thoughts - - - - 0 - 0  PHQ-9 Score - - - - 0 - 4     Review of Systems  HENT: Negative.   Eyes: Negative.   Respiratory: Negative.    Cardiovascular: Negative.   Gastrointestinal: Negative.   Endocrine: Negative.   Genitourinary: Negative.   Musculoskeletal: Positive for arthralgias, back pain, gait problem and myalgias.  Skin: Negative.   Allergic/Immunologic: Negative.   Neurological: Positive for weakness and numbness.       Tingling  Hematological: Negative.   Psychiatric/Behavioral: The patient is nervous/anxious.   All other systems reviewed and are negative.      Objective:   Physical Exam  Constitutional: She is oriented to person, place, and time. She appears well-developed and well-nourished.  HENT:  Head: Normocephalic and atraumatic.  Neck: Normal range of motion. Neck supple.  Cervical Paraspinal Tenderness: C-5-C-6 Decreased ROM 20 Degrees  Pulmonary/Chest: No respiratory distress.  Musculoskeletal:  Normal Muscle Bulk and  Muscle Testing Reveals: Upper Extremities: Right: Full ROM and Muscle Strength 5/5 Left: Decreased ROM 30 Degrees and Muscle Strength 3/5 Left AC Joint Tenderness Thoracic Hypersensitivity Mainly Left Side and  T-1- T-4 Lower Extremities: Full ROM and Muscle Strength 5/5 Arises from chair with ease Narrow Based gait  Neurological: She is alert and oriented to person, place, and time.  Skin: Skin is warm and dry.  Psychiatric: She has a normal mood and affect.  Nursing note and vitals reviewed.         Assessment & Plan:  1. Bilateral sacroiliac joint dysfunction: 08/25/2017 Refilled:HYDROcodone 7.5/325mg  one tablet every 6 hrs as needed # 120.  We will continue the opioid monitoring program, this consists of regular clinic visits, examinations, urine drug screen pill counts as well as use of [place] Cont[place]. 2. Chronic pain syndrome consistent with fibromyalgia. Continue with exercise regime, heat therapy,voltaren gel, lidocaine patches and topamax. Continue current analgesics . 08/25/2017 3. Lumbosacral Spondylosis/ Bilateral  lumbar radiculopathy:Continue Elavil and Cymbalta. 08/25/2017 4. Complex Regional Pain Syndrome: Continue Cymbalta. 08/25/2017 Dr. Yolanda BonineBertrand Following  5. Type 2 diabetes.: PMD Following. 08/25/2017 6. Muscle Spasms: Continue Tizanidine 4 mg one tablet every 8 hours as needed for muscle spasms. 08/25/2017 7. Insomnia: Continue Elavil. 08/25/2017 8.Cervical Radiculopathy: Continue Elavil: 08/25/2017 9. Cervicalgia/ Cervical Inflammation: RX: Cervical X-ray Continue current medication regime. Alternate with heat and Ice Therapy. 08/25/2017 10. Bilateral Thoracic Pain: Continue current medication regime. Continue to Monitor. 08/25/2017 11. Left Shoulder Pain: RX: Left Shoulder X-ray. 08/25/2017  20 minutes of face to face patient care time was spent during this visit. All questions were encouraged and answered.  F/U in 1 month

## 2017-08-26 ENCOUNTER — Ambulatory Visit (HOSPITAL_COMMUNITY)
Admission: RE | Admit: 2017-08-26 | Discharge: 2017-08-26 | Disposition: A | Discharge status: Home / Self Care | Payer: Medicare Other | Source: Ambulatory Visit | Attending: Registered Nurse | Admitting: Registered Nurse

## 2017-08-26 DIAGNOSIS — G8929 Other chronic pain: Secondary | ICD-10-CM | POA: Insufficient documentation

## 2017-08-26 DIAGNOSIS — M25512 Pain in left shoulder: Secondary | ICD-10-CM | POA: Insufficient documentation

## 2017-08-26 DIAGNOSIS — M5010 Cervical disc disorder with radiculopathy, unspecified cervical region: Secondary | ICD-10-CM | POA: Diagnosis present

## 2017-08-27 ENCOUNTER — Telehealth: Payer: Self-pay

## 2017-08-27 NOTE — Telephone Encounter (Signed)
Return [name] call,  she reports her amitriptyline tablets are not scored, she was instructed to take 3 of her amitriptyline tablets = 30 mg. She will call office in a week. If she's able to tolerate the amitriptyline, we will prescribe 25 mg, she verbalizes understanding. We reviewed her X-ray results, she verbalizes understanding.

## 2017-08-27 NOTE — Telephone Encounter (Signed)
Kennon RoundsSally called today, wanting [place] in reguards to her amitriptyline medication.

## 2017-09-15 ENCOUNTER — Encounter: Payer: Self-pay | Admitting: Physical Medicine & Rehabilitation

## 2017-09-15 ENCOUNTER — Encounter: Payer: Medicare Other | Attending: Physical Medicine & Rehabilitation | Admitting: Physical Medicine & Rehabilitation

## 2017-09-15 VITALS — BP 119/64 | HR 95

## 2017-09-15 DIAGNOSIS — G8929 Other chronic pain: Secondary | ICD-10-CM | POA: Diagnosis not present

## 2017-09-15 DIAGNOSIS — M5010 Cervical disc disorder with radiculopathy, unspecified cervical region: Secondary | ICD-10-CM | POA: Insufficient documentation

## 2017-09-15 DIAGNOSIS — Z79899 Other long term (current) drug therapy: Secondary | ICD-10-CM | POA: Insufficient documentation

## 2017-09-15 DIAGNOSIS — M810 Age-related osteoporosis without current pathological fracture: Secondary | ICD-10-CM | POA: Diagnosis not present

## 2017-09-15 DIAGNOSIS — E785 Hyperlipidemia, unspecified: Secondary | ICD-10-CM | POA: Diagnosis not present

## 2017-09-15 DIAGNOSIS — M542 Cervicalgia: Secondary | ICD-10-CM

## 2017-09-15 DIAGNOSIS — G9050 Complex regional pain syndrome I, unspecified: Secondary | ICD-10-CM | POA: Insufficient documentation

## 2017-09-15 DIAGNOSIS — M609 Myositis, unspecified: Secondary | ICD-10-CM | POA: Diagnosis not present

## 2017-09-15 DIAGNOSIS — Z5181 Encounter for therapeutic drug level monitoring: Secondary | ICD-10-CM | POA: Diagnosis not present

## 2017-09-15 DIAGNOSIS — E119 Type 2 diabetes mellitus without complications: Secondary | ICD-10-CM | POA: Diagnosis not present

## 2017-09-15 DIAGNOSIS — Z87891 Personal history of nicotine dependence: Secondary | ICD-10-CM | POA: Diagnosis not present

## 2017-09-15 DIAGNOSIS — M47817 Spondylosis without myelopathy or radiculopathy, lumbosacral region: Secondary | ICD-10-CM | POA: Diagnosis not present

## 2017-09-15 DIAGNOSIS — Z809 Family history of malignant neoplasm, unspecified: Secondary | ICD-10-CM | POA: Diagnosis not present

## 2017-09-15 DIAGNOSIS — M797 Fibromyalgia: Secondary | ICD-10-CM | POA: Insufficient documentation

## 2017-09-15 DIAGNOSIS — Z90710 Acquired absence of both cervix and uterus: Secondary | ICD-10-CM | POA: Diagnosis not present

## 2017-09-15 DIAGNOSIS — G894 Chronic pain syndrome: Secondary | ICD-10-CM

## 2017-09-15 DIAGNOSIS — I10 Essential (primary) hypertension: Secondary | ICD-10-CM | POA: Insufficient documentation

## 2017-09-15 DIAGNOSIS — M5416 Radiculopathy, lumbar region: Secondary | ICD-10-CM | POA: Diagnosis not present

## 2017-09-15 DIAGNOSIS — G90512 Complex regional pain syndrome I of left upper limb: Secondary | ICD-10-CM | POA: Diagnosis not present

## 2017-09-15 DIAGNOSIS — M461 Sacroiliitis, not elsewhere classified: Secondary | ICD-10-CM | POA: Diagnosis not present

## 2017-09-15 DIAGNOSIS — Z9889 Other specified postprocedural states: Secondary | ICD-10-CM | POA: Diagnosis not present

## 2017-09-15 DIAGNOSIS — M25512 Pain in left shoulder: Secondary | ICD-10-CM

## 2017-09-15 DIAGNOSIS — Z9049 Acquired absence of other specified parts of digestive tract: Secondary | ICD-10-CM | POA: Diagnosis not present

## 2017-09-15 DIAGNOSIS — K589 Irritable bowel syndrome without diarrhea: Secondary | ICD-10-CM | POA: Diagnosis not present

## 2017-09-15 MED ORDER — HYDROCODONE-ACETAMINOPHEN 7.5-325 MG PO TABS: 1.0000 | ORAL_TABLET | Freq: Four times a day (QID) | ORAL | 0 refills | Status: DC | PRN

## 2017-09-15 MED ORDER — DICLOFENAC SODIUM 75 MG PO TBEC: 75.0000 mg | DELAYED_RELEASE_TABLET | Freq: Two times a day (BID) | ORAL | 3 refills | Status: DC

## 2017-09-15 MED ORDER — AMITRIPTYLINE HCL 50 MG PO TABS: 50.0000 mg | ORAL_TABLET | Freq: Every day | ORAL | 4 refills | Status: DC

## 2017-09-15 NOTE — Progress Notes (Signed)
PROCEDURE REPORT UPPER EXTREMITY EMG/NCS   COMPLAINT: left arm pain and tingling, ?weakness   Nerve Conduction Study: A  Left upper extremity nerve conduction screen was performed. All skin was prepped with isopropyl alcohol. The median, ulnar, and radial sensory nerves were stimulated antidromically using the middle finger, 5th finger, and the second metacarpal respectively as sites for the active and reference electrodes. Testing revealed normal distal latencies and all sensory nerves with normal amplitude and morphology..   Median and ulnar motor responses were measured orthodromically using the APB and ADM as the site for placement of the active and reference electrodes. The nerves were stimulated both at the wrist.  The median nerve was also stimulated in the antecubital fossa. The ulnar nerve was stimulated above and below the ulnar groove. Testing revealed normal latencies at the wrist.  All nerve conduction velocities were within normal limits.  Electromyography: A right upper extremity muscle screen was performed. I examined the  deltoid, biceps, brachioradialis, triceps, FCU/FCR, ABP, and FDI muscles. Insertional activity: Was normal      .  Spontaneous activty: Nonexistent      . Recruitment: Normal except for the biceps where she had a difficult time with voluntary contraction due to pain.                  .  Conclusion: 1.  Normal nerve conduction study/normal EMG.  Pain likely ongoing from her complex regional pain syndrome.  2.  Full report of numerical values is scanned into the electronic medical records     I increased her amitriptyline to 50 mg nightly.  We also will stop her meloxicam which she had tapered down quite a bit on and begin a trial of diclofenac 75 mg twice daily with food.  She is watching her renal function and liver function every 6 months through her primary physician.    Ranelle OysterZachary T. Robbert Langlinais, MD, [facility]

## 2017-09-15 NOTE — Patient Instructions (Signed)
PLEASE FEEL FREE TO CALL OUR OFFICE WITH ANY PROBLEMS OR QUESTIONS ([phone])      

## 2017-10-27 ENCOUNTER — Ambulatory Visit: Payer: Medicare Other | Admitting: Registered Nurse

## 2017-10-27 ENCOUNTER — Encounter: Payer: Medicare Other | Attending: Physical Medicine & Rehabilitation | Admitting: Physical Medicine & Rehabilitation

## 2017-10-27 ENCOUNTER — Encounter: Payer: Self-pay | Admitting: Physical Medicine & Rehabilitation

## 2017-10-27 VITALS — BP 118/83 | HR 76

## 2017-10-27 DIAGNOSIS — G894 Chronic pain syndrome: Secondary | ICD-10-CM

## 2017-10-27 DIAGNOSIS — Z809 Family history of malignant neoplasm, unspecified: Secondary | ICD-10-CM | POA: Insufficient documentation

## 2017-10-27 DIAGNOSIS — G9050 Complex regional pain syndrome I, unspecified: Secondary | ICD-10-CM | POA: Diagnosis not present

## 2017-10-27 DIAGNOSIS — M797 Fibromyalgia: Secondary | ICD-10-CM | POA: Insufficient documentation

## 2017-10-27 DIAGNOSIS — M810 Age-related osteoporosis without current pathological fracture: Secondary | ICD-10-CM | POA: Insufficient documentation

## 2017-10-27 DIAGNOSIS — Z90710 Acquired absence of both cervix and uterus: Secondary | ICD-10-CM | POA: Insufficient documentation

## 2017-10-27 DIAGNOSIS — E785 Hyperlipidemia, unspecified: Secondary | ICD-10-CM | POA: Insufficient documentation

## 2017-10-27 DIAGNOSIS — I10 Essential (primary) hypertension: Secondary | ICD-10-CM | POA: Diagnosis not present

## 2017-10-27 DIAGNOSIS — K589 Irritable bowel syndrome without diarrhea: Secondary | ICD-10-CM | POA: Diagnosis not present

## 2017-10-27 DIAGNOSIS — Z79891 Long term (current) use of opiate analgesic: Secondary | ICD-10-CM | POA: Diagnosis not present

## 2017-10-27 DIAGNOSIS — M609 Myositis, unspecified: Secondary | ICD-10-CM | POA: Insufficient documentation

## 2017-10-27 DIAGNOSIS — M461 Sacroiliitis, not elsewhere classified: Secondary | ICD-10-CM | POA: Insufficient documentation

## 2017-10-27 DIAGNOSIS — M47817 Spondylosis without myelopathy or radiculopathy, lumbosacral region: Secondary | ICD-10-CM

## 2017-10-27 DIAGNOSIS — M5010 Cervical disc disorder with radiculopathy, unspecified cervical region: Secondary | ICD-10-CM

## 2017-10-27 DIAGNOSIS — Z5181 Encounter for therapeutic drug level monitoring: Secondary | ICD-10-CM

## 2017-10-27 DIAGNOSIS — Z79899 Other long term (current) drug therapy: Secondary | ICD-10-CM

## 2017-10-27 DIAGNOSIS — G90512 Complex regional pain syndrome I of left upper limb: Secondary | ICD-10-CM

## 2017-10-27 DIAGNOSIS — E119 Type 2 diabetes mellitus without complications: Secondary | ICD-10-CM | POA: Diagnosis not present

## 2017-10-27 DIAGNOSIS — Z9049 Acquired absence of other specified parts of digestive tract: Secondary | ICD-10-CM | POA: Diagnosis not present

## 2017-10-27 DIAGNOSIS — Z9889 Other specified postprocedural states: Secondary | ICD-10-CM | POA: Diagnosis not present

## 2017-10-27 DIAGNOSIS — M5416 Radiculopathy, lumbar region: Secondary | ICD-10-CM | POA: Diagnosis not present

## 2017-10-27 DIAGNOSIS — Z87891 Personal history of nicotine dependence: Secondary | ICD-10-CM | POA: Insufficient documentation

## 2017-10-27 MED ORDER — TOPIRAMATE 25 MG PO CPSP: 25.0000 mg | ORAL_CAPSULE | Freq: Two times a day (BID) | ORAL | 3 refills | Status: DC

## 2017-10-27 MED ORDER — HYDROCODONE-ACETAMINOPHEN 7.5-325 MG PO TABS: 1.0000 | ORAL_TABLET | Freq: Four times a day (QID) | ORAL | 0 refills | Status: DC | PRN

## 2017-10-27 MED ORDER — CARBAMAZEPINE 200 MG PO TABS: 200.0000 mg | ORAL_TABLET | Freq: Two times a day (BID) | ORAL | 5 refills | Status: DC

## 2017-10-27 NOTE — Patient Instructions (Signed)
Take the carbamazepine 200 mg at night for a week then increase to twice daily thereafter.   PLEASE FEEL FREE TO CALL OUR OFFICE WITH ANY PROBLEMS OR QUESTIONS [phone])

## 2017-10-27 NOTE — Progress Notes (Signed)
Subjective:    Patient ID: Shannon French, female    DOB: [birth year], 52 y.o.   MRN: [id number]  HPI   Shannon French is here in follow-up of her chronic pain syndrome.  Last month I performed a left upper extremity nerve conduction and EMG which was normal.  She continues to have tingling and burning as well as heaviness in the left upper extremity which inhibits and affects her everyday living activities.  She is struggling to do things such as combing her hair and putting on clothing.  She feels that her arm is becoming more sensitive over the last several months.  We have not made any changes with her medications per se over this time.  She remains on Cymbalta 90 mg daily as well as Topamax 25 mg twice daily (also for headaches).  She is using hydrocodone 7.5 up to 4 times daily also in addition to amitriptyline 50 mg and Voltaren 75 mg daily which we changed to last month.   Pain Inventory Average Pain 7 Pain Right Now 8 My pain is constant, sharp, burning, dull, stabbing, tingling and aching  In the last 24 hours, has pain interfered with the following? General activity 8 Relation with others 8 Enjoyment of life 8 What TIME of day is your pain at its worst? all Sleep (in general) Fair  Pain is worse with: walking, bending, sitting, inactivity, standing and some activites Pain improves with: rest, heat/ice, therapy/exercise and medication Relief from Meds: 6  Mobility walk without assistance how many minutes can you walk? 30 ability to climb steps?  yes do you drive?  yes transfers alone Do you have any goals in this area?  yes  Function disabled: date disabled . I need assistance with the following:  dressing, meal prep, household duties and shopping Do you have any goals in this area?  yes  Neuro/Psych weakness numbness tingling spasms anxiety  Prior Studies Any changes since last visit?  no  Physicians involved in your care Any changes since last visit?  no   Family  History  Problem Relation Age of Onset  . Cancer Father    Social History   Socioeconomic History  . Marital status: Single    Spouse name: Not on file  . Number of children: Not on file  . Years of education: Not on file  . Highest education level: Not on file  Occupational History  . Not on file  Social Needs  . Financial resource strain: Not on file  . Food insecurity:    Worry: Not on file    Inability: Not on file  . Transportation needs:    Medical: Not on file    Non-medical: Not on file  Tobacco Use  . Smoking status: Former Smoker    Last attempt to quit: 10/14/2009    Years since quitting: 8.0  . Smokeless tobacco: Never Used  Substance and Sexual Activity  . Alcohol use: No  . Drug use: No  . Sexual activity: Not on file  Lifestyle  . Physical activity:    Days per week: Not on file    Minutes per session: Not on file  . Stress: Not on file  Relationships  . Social connections:    Talks on phone: Not on file    Gets together: Not on file    Attends religious service: Not on file    Active member of club or organization: Not on file    Attends meetings of clubs or  organizations: Not on file    Relationship status: Not on file  Other Topics Concern  . Not on file  Social History Narrative  . Not on file   Past Surgical History:  Procedure Laterality Date  . ABDOMINAL HYSTERECTOMY    . ABDOMINAL SURGERY    . APPENDECTOMY    . CHOLECYSTECTOMY    . CYST EXCISION    . OVARY SURGERY     Past Medical History:  Diagnosis Date  . Arm pain    left  . Degenerative disc disease   . Diabetes mellitus   . Fibromyalgia   . Hyperlipidemia   . Hypertension   . IBS (irritable bowel syndrome)   . Osteoporosis    BP 118/83 (BP Location: Right Arm, Patient Position: Sitting, Cuff Size: Normal)   Pulse 76   SpO2 98%   Opioid Risk Score:   Fall Risk Score:  `1  Depression screen PHQ 2/9  Depression screen [facility] 2/9 07/22/2017 11/12/2016 08/12/2016 12/27/2015  11/01/2015 03/20/2015 11/04/2014  Decreased Interest 0 0 0 0 0 0 0  Down, Depressed, Hopeless 0 0 0 0 0 0 0  PHQ - 2 Score 0 0 0 0 0 0 0  Altered sleeping - - - - 0 - 3  Tired, decreased energy - - - - 0 - 1  Change in appetite - - - - 0 - 0  Feeling bad or failure about yourself  - - - - 0 - 0  Trouble concentrating - - - - 0 - 0  Moving slowly or fidgety/restless - - - - 0 - 0  Suicidal thoughts - - - - 0 - 0  PHQ-9 Score - - - - 0 - 4    Review of Systems  Constitutional: Negative.   HENT: Negative.   Eyes: Negative.   Respiratory: Negative.   Cardiovascular: Negative.   Gastrointestinal: Negative.   Endocrine: Negative.   Genitourinary: Negative.   Musculoskeletal: Positive for arthralgias, back pain, myalgias and neck pain.       Spasms  Skin: Negative.   Allergic/Immunologic: Negative.   Neurological: Positive for weakness and numbness.       Tingling  Psychiatric/Behavioral: The patient is nervous/anxious.        Objective:   Physical Exam General: No acute distress HEENT: EOMI, oral membranes moist Cards: reg rate  Chest: normal effort Abdomen: Soft, NT, ND Skin: dry, intact Extremities: no edema  Musculoskeletal: normal finger ROM/fingers are a little cool. LB TTP. Left arm remains allodynic, no color/temperature changes.  Neurological: She is alert and oriented to person, place, and time.  Reflex Scores:  Patellar reflexes are 1+on the right side and 2+on the left side.  Achilles reflexes are 1+on the right side and 2+on the left side. 4/5 right ADF, APF, KE, HF. 4+/5 on left. UE motor normal as is sensation today. UE DTR's are 1+.    Psychiatric: She has a normal mood and affect. Her behavior is normal. Judgment and thought content normal. Cognition and memory are normal.  Assessment & Plan:  ASSESSMENT:  1. Bilateral sacroiliac joint dysfunction, status post radio  frequencies which have been efficacious. Pain has increased substantially again.  2.  Chronic pain syndrome consistent with fibromyalgia.  3. ?Right lumbar radiculopathy  3. Type 2 diabetes.  4. CRPS 1? LU E, Recent EMG/NCS --negative 5. Chronic pain:    PLAN:  1. Tizanidine for spasms and sleep. meloxicam prn for pain flares  2. Encouraged ongoing  efforts to desensitize arm             -  3. Hydrocodone was refilled. We will continue the opioid monitoring program, this consists of regular clinic visits, examinations, routine drug screening, pill counts as well as use of [place]. [place] was [place] today.   -consider oxycodone.   4. Consider followup SNB's at some point.  -also consider RF if symptoms dictate 5. Continue elavill at 50mg  qhs---continue cymbalta at 90mg              -continues on topamax as well. This could be contributing to paresthesias however   -add tegretol 200mg  bid, beginning at HS initialy- will potentially taper topamax depending upon response 6. Follow up with me or NP in about one month. 15 minutes of face to face patient care time were spent during this visit. All questions were encouraged and answered.

## 2017-11-04 LAB — TOXASSURE SELECT,+ANTIDEPR,UR

## 2017-11-06 ENCOUNTER — Telehealth: Payer: Self-pay | Admitting: *Deleted

## 2017-11-06 NOTE — Telephone Encounter (Signed)
Urine drug screen for this encounter is consistent for prescribed medication 

## 2017-11-26 ENCOUNTER — Encounter: Payer: Medicare Other | Admitting: Physical Medicine & Rehabilitation

## 2017-12-02 ENCOUNTER — Encounter: Payer: Self-pay | Admitting: Physical Medicine & Rehabilitation

## 2017-12-02 ENCOUNTER — Encounter: Payer: Medicare Other | Attending: Physical Medicine & Rehabilitation | Admitting: Physical Medicine & Rehabilitation

## 2017-12-02 DIAGNOSIS — M810 Age-related osteoporosis without current pathological fracture: Secondary | ICD-10-CM | POA: Insufficient documentation

## 2017-12-02 DIAGNOSIS — M5416 Radiculopathy, lumbar region: Secondary | ICD-10-CM | POA: Insufficient documentation

## 2017-12-02 DIAGNOSIS — G9050 Complex regional pain syndrome I, unspecified: Secondary | ICD-10-CM | POA: Insufficient documentation

## 2017-12-02 DIAGNOSIS — M461 Sacroiliitis, not elsewhere classified: Secondary | ICD-10-CM | POA: Insufficient documentation

## 2017-12-02 DIAGNOSIS — G8929 Other chronic pain: Secondary | ICD-10-CM | POA: Diagnosis not present

## 2017-12-02 DIAGNOSIS — Z809 Family history of malignant neoplasm, unspecified: Secondary | ICD-10-CM | POA: Diagnosis not present

## 2017-12-02 DIAGNOSIS — Z87891 Personal history of nicotine dependence: Secondary | ICD-10-CM | POA: Insufficient documentation

## 2017-12-02 DIAGNOSIS — Z90710 Acquired absence of both cervix and uterus: Secondary | ICD-10-CM | POA: Diagnosis not present

## 2017-12-02 DIAGNOSIS — M25512 Pain in left shoulder: Secondary | ICD-10-CM | POA: Diagnosis not present

## 2017-12-02 DIAGNOSIS — Z79899 Other long term (current) drug therapy: Secondary | ICD-10-CM | POA: Insufficient documentation

## 2017-12-02 DIAGNOSIS — M797 Fibromyalgia: Secondary | ICD-10-CM | POA: Insufficient documentation

## 2017-12-02 DIAGNOSIS — Z9889 Other specified postprocedural states: Secondary | ICD-10-CM | POA: Insufficient documentation

## 2017-12-02 DIAGNOSIS — I10 Essential (primary) hypertension: Secondary | ICD-10-CM | POA: Diagnosis not present

## 2017-12-02 DIAGNOSIS — M609 Myositis, unspecified: Secondary | ICD-10-CM | POA: Diagnosis not present

## 2017-12-02 DIAGNOSIS — K589 Irritable bowel syndrome without diarrhea: Secondary | ICD-10-CM | POA: Insufficient documentation

## 2017-12-02 DIAGNOSIS — Z5181 Encounter for therapeutic drug level monitoring: Secondary | ICD-10-CM | POA: Diagnosis not present

## 2017-12-02 DIAGNOSIS — Z9049 Acquired absence of other specified parts of digestive tract: Secondary | ICD-10-CM | POA: Insufficient documentation

## 2017-12-02 DIAGNOSIS — G894 Chronic pain syndrome: Secondary | ICD-10-CM | POA: Diagnosis not present

## 2017-12-02 DIAGNOSIS — E119 Type 2 diabetes mellitus without complications: Secondary | ICD-10-CM | POA: Diagnosis not present

## 2017-12-02 DIAGNOSIS — M47817 Spondylosis without myelopathy or radiculopathy, lumbosacral region: Secondary | ICD-10-CM | POA: Diagnosis not present

## 2017-12-02 DIAGNOSIS — E785 Hyperlipidemia, unspecified: Secondary | ICD-10-CM | POA: Diagnosis not present

## 2017-12-02 DIAGNOSIS — M5010 Cervical disc disorder with radiculopathy, unspecified cervical region: Secondary | ICD-10-CM | POA: Insufficient documentation

## 2017-12-02 MED ORDER — HYDROCODONE-ACETAMINOPHEN 7.5-325 MG PO TABS: 1.0000 | ORAL_TABLET | Freq: Four times a day (QID) | ORAL | 0 refills | Status: DC | PRN

## 2017-12-02 NOTE — Patient Instructions (Signed)
REDUCE TOPAMAX TO ONCE AT NIGHT TIME ONLY FOR ONE WEEK THEN STOP

## 2017-12-02 NOTE — Progress Notes (Signed)
Subjective:    Patient ID: Shannon French, female    DOB: [birth year], 52 y.o.   MRN: [id number]  HPI   Aitanna is here in follow up of her chronic pain. She has had increasing problems with her left shoulder over the last few months. Her left shoulder xray from January was unremarkable. I reviewed her cervical MRI from 2015 which was unremarkable.   She saw an orthopedic surgeon in early March who recommended an MRI.  Kennadi reports ongoing issues with using her left arm for activities around the house.  She struggles lifting the arm up to her side.  The arm has bothered her for some time however inability to lift the arm is different and the pain seems to be more focused at the shoulder as opposed to the lower arm at this point.   She is done well with the Tegretol for centralized pain.  She does have some concerns that it may have caused some bruising on her arm.  She demonstrates some these areas today which were mild.  She continues on Topamax 25 mg twice daily as well.   Pain Inventory Average Pain 7 Pain Right Now 7 My pain is constant, sharp, burning, dull, stabbing, tingling and aching  In the last 24 hours, has pain interfered with the following? General activity 8 Relation with others 8 Enjoyment of life 8 What TIME of day is your pain at its worst? all Sleep (in general) Fair  Pain is worse with: walking, bending, sitting, inactivity, standing and some activites Pain improves with: rest, heat/ice, pacing activities and medication Relief from Meds: 7  Mobility walk without assistance ability to climb steps?  yes do you drive?  yes  Function disabled: date disabled 2003  Neuro/Psych weakness numbness tingling spasms anxiety  Prior Studies Any changes since last visit?  no  Physicians involved in your care Any changes since last visit?  no   Family History  Problem Relation Age of Onset  . Cancer Father    Social History   Socioeconomic History  .  Marital status: Single    Spouse name: Not on file  . Number of children: Not on file  . Years of education: Not on file  . Highest education level: Not on file  Occupational History  . Not on file  Social Needs  . Financial resource strain: Not on file  . Food insecurity:    Worry: Not on file    Inability: Not on file  . Transportation needs:    Medical: Not on file    Non-medical: Not on file  Tobacco Use  . Smoking status: Former Smoker    Last attempt to quit: 10/14/2009    Years since quitting: 8.1  . Smokeless tobacco: Never Used  Substance and Sexual Activity  . Alcohol use: No  . Drug use: No  . Sexual activity: Not on file  Lifestyle  . Physical activity:    Days per week: Not on file    Minutes per session: Not on file  . Stress: Not on file  Relationships  . Social connections:    Talks on phone: Not on file    Gets together: Not on file    Attends religious service: Not on file    Active member of club or organization: Not on file    Attends meetings of clubs or organizations: Not on file    Relationship status: Not on file  Other Topics Concern  . Not on  file  Social History Narrative  . Not on file   Past Surgical History:  Procedure Laterality Date  . ABDOMINAL HYSTERECTOMY    . ABDOMINAL SURGERY    . APPENDECTOMY    . CHOLECYSTECTOMY    . CYST EXCISION    . OVARY SURGERY     Past Medical History:  Diagnosis Date  . Arm pain    left  . Degenerative disc disease   . Diabetes mellitus   . Fibromyalgia   . Hyperlipidemia   . Hypertension   . IBS (irritable bowel syndrome)   . Osteoporosis    BP (!) 136/93   Pulse 97   Ht  (1.6 m)   Wt 150 lb (68 kg)   SpO2 98%   BMI 26.57 kg/m   Opioid Risk Score:   Fall Risk Score:  `1  Depression screen PHQ 2/9  Depression screen [facility] 2/9 07/22/2017 11/12/2016 08/12/2016 12/27/2015 11/01/2015 03/20/2015 11/04/2014  Decreased Interest 0 0 0 0 0 0 0  Down, Depressed, Hopeless 0 0 0 0 0 0 0  PHQ -  2 Score 0 0 0 0 0 0 0  Altered sleeping - - - - 0 - 3  Tired, decreased energy - - - - 0 - 1  Change in appetite - - - - 0 - 0  Feeling bad or failure about yourself  - - - - 0 - 0  Trouble concentrating - - - - 0 - 0  Moving slowly or fidgety/restless - - - - 0 - 0  Suicidal thoughts - - - - 0 - 0  PHQ-9 Score - - - - 0 - 4     Review of Systems  Constitutional: Negative.   HENT: Negative.   Eyes: Negative.   Respiratory: Negative.   Cardiovascular: Negative.   Gastrointestinal: Negative.   Endocrine: Negative.   Genitourinary: Negative.   Musculoskeletal: Positive for arthralgias, back pain, myalgias and neck pain.  Skin: Negative.   Allergic/Immunologic: Negative.   Neurological: Positive for weakness and numbness.  Hematological: Negative.   Psychiatric/Behavioral: The patient is nervous/anxious.   All other systems reviewed and are negative.      Objective:   Physical Exam  General: No acute distress HEENT: EOMI, oral membranes moist Cards: reg rate  Chest: normal effort Abdomen: Soft, NT, ND Skin: dry, intact.  A bruise or 2 on the right forearm.  Skin is slightly reddened but it appears that she has been outside.  Her skin tone in general is darker. Extremities: no edema   Musculoskeletal: Has difficulty abducting left arm and can only achieve about 45 degrees of shoulder abduction..  -impingement maneuver + but also pain with Speeds and palpation of the biceps tendon and AC jt. Cross armed man positive. LB TTP. Left arm is allodynic, no color/temperature changes.  Neurological:  4/5 right ADF, APF, KE, HF. 4+/5 on left grossly. UE motor normal as is sensation is stable. UE DTR's are 1+.   Psychiatric: She has a normal mood and affect. Her behavior is normal. Judgment and thought content normal. Cognition and memory are normal.  Assessment & Plan:  ASSESSMENT:  1. Bilateral sacroiliac joint dysfunction, status post radio  frequencies which have been  efficacious. Pain has increased substantially again.  2. Chronic pain syndrome consistent with fibromyalgia.  3. ?Right lumbar radiculopathy  3. Type 2 diabetes.  4. CRPS 1? LU E, Recent EMG/NCS --negative 5. Worsening left shoulder pain concerning for rotator cuff tendinitis.  Also may have some bicipital tendon involvement as well.   PLAN:  1. Tizanidine for spasms and sleep. meloxicam prn for pain flares  2. ordered MRI of left shoulder to more completely assess the joint and tendons..  3. Hydrocodonewas refilled. We will continue the controlled substance monitoring program, this consists of regular clinic visits, examinations, routine drug screening, pill counts as well as use of [place] Controlled Substance Reporting System. [place].   4. Consider followupSNB's at some point. -also consider RF if symptoms dictate 5. Continue elavill at  qhs---continue cymbalta at              -Continue Tegretol  bid  -take topamax at night for one week then stop. Can resume if migraines recur. 6. Follow up with me in about one month. 15 minutes of face to face patient care time were spent during this visit. All questions were encouraged and answered.

## 2018-01-05 ENCOUNTER — Encounter: Payer: Self-pay | Admitting: Physical Medicine & Rehabilitation

## 2018-01-05 ENCOUNTER — Other Ambulatory Visit: Payer: Self-pay | Admitting: *Deleted

## 2018-01-05 ENCOUNTER — Encounter: Payer: Medicare Other | Attending: Physical Medicine & Rehabilitation | Admitting: Physical Medicine & Rehabilitation

## 2018-01-05 VITALS — BP 150/101 | HR 94 | Ht 63.0 in | Wt 150.0 lb

## 2018-01-05 DIAGNOSIS — M609 Myositis, unspecified: Secondary | ICD-10-CM | POA: Insufficient documentation

## 2018-01-05 DIAGNOSIS — Z9889 Other specified postprocedural states: Secondary | ICD-10-CM | POA: Diagnosis not present

## 2018-01-05 DIAGNOSIS — G8929 Other chronic pain: Secondary | ICD-10-CM | POA: Diagnosis not present

## 2018-01-05 DIAGNOSIS — E785 Hyperlipidemia, unspecified: Secondary | ICD-10-CM | POA: Diagnosis not present

## 2018-01-05 DIAGNOSIS — Z90710 Acquired absence of both cervix and uterus: Secondary | ICD-10-CM | POA: Insufficient documentation

## 2018-01-05 DIAGNOSIS — Z809 Family history of malignant neoplasm, unspecified: Secondary | ICD-10-CM | POA: Diagnosis not present

## 2018-01-05 DIAGNOSIS — G894 Chronic pain syndrome: Secondary | ICD-10-CM | POA: Insufficient documentation

## 2018-01-05 DIAGNOSIS — M5010 Cervical disc disorder with radiculopathy, unspecified cervical region: Secondary | ICD-10-CM | POA: Diagnosis not present

## 2018-01-05 DIAGNOSIS — G9050 Complex regional pain syndrome I, unspecified: Secondary | ICD-10-CM | POA: Diagnosis not present

## 2018-01-05 DIAGNOSIS — M461 Sacroiliitis, not elsewhere classified: Secondary | ICD-10-CM | POA: Diagnosis not present

## 2018-01-05 DIAGNOSIS — M25511 Pain in right shoulder: Secondary | ICD-10-CM | POA: Diagnosis not present

## 2018-01-05 DIAGNOSIS — M47817 Spondylosis without myelopathy or radiculopathy, lumbosacral region: Secondary | ICD-10-CM | POA: Diagnosis not present

## 2018-01-05 DIAGNOSIS — M797 Fibromyalgia: Secondary | ICD-10-CM | POA: Diagnosis not present

## 2018-01-05 DIAGNOSIS — M810 Age-related osteoporosis without current pathological fracture: Secondary | ICD-10-CM | POA: Diagnosis not present

## 2018-01-05 DIAGNOSIS — Z9049 Acquired absence of other specified parts of digestive tract: Secondary | ICD-10-CM | POA: Insufficient documentation

## 2018-01-05 DIAGNOSIS — Z87891 Personal history of nicotine dependence: Secondary | ICD-10-CM | POA: Insufficient documentation

## 2018-01-05 DIAGNOSIS — M5416 Radiculopathy, lumbar region: Secondary | ICD-10-CM | POA: Insufficient documentation

## 2018-01-05 DIAGNOSIS — E119 Type 2 diabetes mellitus without complications: Secondary | ICD-10-CM | POA: Insufficient documentation

## 2018-01-05 DIAGNOSIS — Z5181 Encounter for therapeutic drug level monitoring: Secondary | ICD-10-CM | POA: Diagnosis not present

## 2018-01-05 DIAGNOSIS — I10 Essential (primary) hypertension: Secondary | ICD-10-CM | POA: Insufficient documentation

## 2018-01-05 DIAGNOSIS — K589 Irritable bowel syndrome without diarrhea: Secondary | ICD-10-CM | POA: Insufficient documentation

## 2018-01-05 DIAGNOSIS — Z79899 Other long term (current) drug therapy: Secondary | ICD-10-CM | POA: Diagnosis not present

## 2018-01-05 DIAGNOSIS — M25512 Pain in left shoulder: Secondary | ICD-10-CM

## 2018-01-05 MED ORDER — HYDROCODONE-ACETAMINOPHEN 7.5-325 MG PO TABS: 1.0000 | ORAL_TABLET | Freq: Four times a day (QID) | ORAL | 0 refills | Status: DC | PRN

## 2018-01-05 MED ORDER — DIAZEPAM 5 MG PO TABS: ORAL_TABLET | ORAL | 0 refills | Status: DC

## 2018-01-05 NOTE — Progress Notes (Signed)
Subjective:    Patient ID: Shannon French, female    DOB: 02/29/1966, 52 y.o.   MRN: [id number]  HPI   Shannon French is here today in follow-up of her chronic pain.  And her last visit on April 30, I had ordered an MRI of her left shoulder.  To this point it has not been done. She says that no one ever called her. We had scheduled it a [place] [facility].    For pain she remains on hydrocodone for pain, which she uses 4x daily. She also takes zanaflex and uses her lidoderm patch as well as oral/topical diclofenac.   She weaned off the topamax and hasn't noticed a big change. She had one "bad" headache but really has done well.  She sees an endocrinologist to help with her diabetes management and cholesterol.   Shannon French has tried to maintain physical activity by walking in her pool. She does it often with her sister. She likes to do squats in the pool as well.   Pain Inventory Average Pain 7 Pain Right Now 7 My pain is constant, sharp, burning, dull, stabbing, tingling and aching  In the last 24 hours, has pain interfered with the following? General activity 8 Relation with others 8 Enjoyment of life 8 What TIME of day is your pain at its worst? all Sleep (in general) Fair  Pain is worse with: . Pain improves with: . Relief from Meds: .  Mobility walk without assistance ability to climb steps?  yes do you drive?  yes  Function disabled: date disabled .  Neuro/Psych weakness numbness tingling spasms anxiety  Prior Studies Any changes since last visit?  no  Physicians involved in your care Any changes since last visit?  no   Family History  Problem Relation Age of Onset  . Cancer Father    Social History   Socioeconomic History  . Marital status: Single    Spouse name: Not on file  . Number of children: Not on file  . Years of education: Not on file  . Highest education level: Not on file  Occupational History  . Not on file  Social Needs  . Financial resource strain:  Not on file  . Food insecurity:    Worry: Not on file    Inability: Not on file  . Transportation needs:    Medical: Not on file    Non-medical: Not on file  Tobacco Use  . Smoking status: Former Smoker    Last attempt to quit: 10/14/2009    Years since quitting: 8.2  . Smokeless tobacco: Never Used  Substance and Sexual Activity  . Alcohol use: No  . Drug use: No  . Sexual activity: Not on file  Lifestyle  . Physical activity:    Days per week: Not on file    Minutes per session: Not on file  . Stress: Not on file  Relationships  . Social connections:    Talks on phone: Not on file    Gets together: Not on file    Attends religious service: Not on file    Active member of club or organization: Not on file    Attends meetings of clubs or organizations: Not on file    Relationship status: Not on file  Other Topics Concern  . Not on file  Social History Narrative  . Not on file   Past Surgical History:  Procedure Laterality Date  . ABDOMINAL HYSTERECTOMY    . ABDOMINAL SURGERY    .  APPENDECTOMY    . CHOLECYSTECTOMY    . CYST EXCISION    . OVARY SURGERY     Past Medical History:  Diagnosis Date  . Arm pain    left  . Degenerative disc disease   . Diabetes mellitus   . Fibromyalgia   . Hyperlipidemia   . Hypertension   . IBS (irritable bowel syndrome)   . Osteoporosis    There were no vitals taken for this visit.  Opioid Risk Score:   Fall Risk Score:  `1  Depression screen PHQ 2/9  [facility] 07/22/2017 11/12/2016 08/12/2016 12/27/2015 11/01/2015 03/20/2015 11/04/2014  Decreased Interest 0 0 0 0 0 0 0  Down, Depressed, Hopeless 0 0 0 0 0 0 0  PHQ - 2 Score 0 0 0 0 0 0 0  Altered sleeping - - - - 0 - 3  Tired, decreased energy - - - - 0 - 1  Change in appetite - - - - 0 - 0  Feeling bad or failure about yourself  - - - - 0 - 0  Trouble concentrating - - - - 0 - 0  Moving slowly or fidgety/restless - - - - 0 - 0  Suicidal thoughts - - - - 0 - 0   PHQ-9 Score - - - - 0 - 4     Review of Systems     Objective:   Physical Exam General: No acute distress HEENT: EOMI, oral membranes moist Cards: reg rate  Chest: normal effort Abdomen: Soft, NT, ND Skin: dry, intact Extremities: no edema  Musculoskeletal:  continues difficulty with active shoulder abduction. Left shoulder pain with impingement maneuver +,  Pain with ER/IR LB TTP. Left arm is allodynic, no color/temperature changes. Neurological:  4/5 right ADF, APF, KE, HF.  4+/5 on left grossly. UE motor normal as is sensation is stable. UE DTR's are 1+.   Psychiatric: alert and appropriate  .  Assessment & Plan:  ASSESSMENT:  1. Bilateral sacroiliac joint dysfunction, status post radio  frequencies which have been efficacious. Pain has increased substantially again.  2. Chronic pain syndrome consistent with fibromyalgia.  3. ?Right lumbar radiculopathy  3. Type 2 diabetes.  4. CRPS 1? LU E, Recent EMG/NCS --negative 5. Worsening left shoulder pain concerning for rotator cuff tendinitis.  Also may have some bicipital tendon involvement as well.   PLAN:  1. Tizanidine for spasms and sleep. meloxicam prn for pain flares  2.await MRI of left shoulder to more completely assess the joint and tendons. The patient doesn't want steroids because of her diabetes. Likely we will proceed with therapy if MRI findings are not too severe.   3. Hydrocodonewas refilled.We will continue the controlled substance monitoring program, this consists of regular clinic visits, examinations, routine drug screening, pill counts as well as use of [place]. [place].   4. Consider followupSGNB's at some point. -also consider RF if symptoms dictate 5. Continue elavillat 50mg  qhs---continue cymbalta at 90mg   -Continue Tegretol 200mg  bid           - now off topamax 6. Follow up withme in about two  month. 15 minutes of face to face patient care time were spent during this visit. All questions were encouraged and answered.

## 2018-01-05 NOTE — Patient Instructions (Signed)
PLEASE FEEL FREE TO CALL OUR OFFICE WITH ANY PROBLEMS OR QUESTIONS ([phone])      

## 2018-01-08 ENCOUNTER — Ambulatory Visit
Admission: RE | Admit: 2018-01-08 | Discharge: 2018-01-08 | Disposition: A | Discharge status: Home / Self Care | Payer: Medicare Other | Source: Ambulatory Visit | Attending: Physical Medicine & Rehabilitation | Admitting: Physical Medicine & Rehabilitation

## 2018-01-08 DIAGNOSIS — M25512 Pain in left shoulder: Principal | ICD-10-CM

## 2018-01-08 DIAGNOSIS — G8929 Other chronic pain: Secondary | ICD-10-CM

## 2018-01-09 ENCOUNTER — Telehealth: Payer: Self-pay

## 2018-01-09 NOTE — Telephone Encounter (Signed)
Pt called stating she is returning Dr. Riley KillSwartz call

## 2018-01-13 NOTE — Telephone Encounter (Signed)
Patient would like for you to call her on her cell phone.  She is at the hospital today.

## 2018-01-13 NOTE — Telephone Encounter (Signed)
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 Would recommend OT to work on ROM as a start.

## 2018-01-13 NOTE — Telephone Encounter (Signed)
[facility] who has had surgery. She had some problems with cell but should be working now. (I reached her at home) She is ok with PT but will not know anything about how long she will have to care for sister until she meets with her surgeon on Monday.--so she can't do anything before then.

## 2018-01-14 NOTE — Telephone Encounter (Signed)
She should let us know when she will be available and then we can make referral. thx

## 2018-01-24 ENCOUNTER — Other Ambulatory Visit: Payer: Self-pay | Admitting: Registered Nurse

## 2018-01-25 ENCOUNTER — Other Ambulatory Visit: Payer: Self-pay | Admitting: Registered Nurse

## 2018-01-26 MED ORDER — DULOXETINE HCL 60 MG PO CPEP: ORAL_CAPSULE | ORAL | 1 refills | Status: DC

## 2018-01-27 ENCOUNTER — Telehealth: Payer: Self-pay | Admitting: *Deleted

## 2018-01-27 DIAGNOSIS — M75102 Unspecified rotator cuff tear or rupture of left shoulder, not specified as traumatic: Secondary | ICD-10-CM

## 2018-01-27 NOTE — Telephone Encounter (Signed)
Done

## 2018-01-27 NOTE — Telephone Encounter (Signed)
Kennon RoundsSally called and she is back home after helping with her sister's surgical recovery and ready to go ahead with therapy.  [facility]

## 2018-01-28 NOTE — Telephone Encounter (Signed)
Left Mrs Shannon French a message notifying her the referral had been placed.

## 2018-01-28 NOTE — Telephone Encounter (Signed)
Called and left voicemail for patient.

## 2018-02-04 ENCOUNTER — Other Ambulatory Visit: Payer: Self-pay | Admitting: Registered Nurse

## 2018-02-04 ENCOUNTER — Telehealth: Payer: Self-pay

## 2018-02-04 DIAGNOSIS — M797 Fibromyalgia: Secondary | ICD-10-CM

## 2018-02-04 DIAGNOSIS — M47817 Spondylosis without myelopathy or radiculopathy, lumbosacral region: Secondary | ICD-10-CM

## 2018-02-04 NOTE — Telephone Encounter (Signed)
Already reordered

## 2018-02-13 ENCOUNTER — Other Ambulatory Visit: Payer: Self-pay | Admitting: Physical Medicine & Rehabilitation

## 2018-02-13 DIAGNOSIS — M542 Cervicalgia: Secondary | ICD-10-CM

## 2018-02-13 DIAGNOSIS — G8929 Other chronic pain: Secondary | ICD-10-CM

## 2018-02-13 DIAGNOSIS — M47817 Spondylosis without myelopathy or radiculopathy, lumbosacral region: Secondary | ICD-10-CM

## 2018-02-13 DIAGNOSIS — M25512 Pain in left shoulder: Principal | ICD-10-CM

## 2018-02-13 DIAGNOSIS — Z79899 Other long term (current) drug therapy: Secondary | ICD-10-CM

## 2018-02-13 DIAGNOSIS — G90512 Complex regional pain syndrome I of left upper limb: Secondary | ICD-10-CM

## 2018-02-13 DIAGNOSIS — Z5181 Encounter for therapeutic drug level monitoring: Secondary | ICD-10-CM

## 2018-02-13 DIAGNOSIS — G894 Chronic pain syndrome: Secondary | ICD-10-CM

## 2018-02-16 ENCOUNTER — Telehealth: Payer: Self-pay | Admitting: *Deleted

## 2018-02-16 NOTE — Telephone Encounter (Signed)
Kennon RoundsSally called for a refill on her hydrocodone. I called her back and left message that Dr Riley KillSwartz sent 2 months rx to pharmacy and she should have that at the pharmacy.

## 2018-02-19 ENCOUNTER — Other Ambulatory Visit: Payer: Self-pay

## 2018-02-19 ENCOUNTER — Ambulatory Visit: Payer: Medicare Other | Attending: Physical Medicine & Rehabilitation

## 2018-02-19 DIAGNOSIS — M25512 Pain in left shoulder: Secondary | ICD-10-CM | POA: Insufficient documentation

## 2018-02-19 DIAGNOSIS — G8929 Other chronic pain: Secondary | ICD-10-CM | POA: Insufficient documentation

## 2018-02-19 DIAGNOSIS — M25612 Stiffness of left shoulder, not elsewhere classified: Secondary | ICD-10-CM | POA: Insufficient documentation

## 2018-02-19 NOTE — Therapy (Signed)
[facility] [address] [place], [place], [address] Phone: [phone]   Fax:  [phone]  Physical Therapy Evaluation  Patient Details  Name: Shannon French MRN: [id number] Date of Birth: 1965/11/21 Referring Provider: Georgia Dom, MD     Encounter Date: 02/19/2018  PT End of Session - 02/19/18 0944    Visit Number  1    Number of Visits  12    Date for PT Re-Evaluation  04/03/18    Authorization Type  [employer] MCR    PT Start Time  0930    PT Stop Time  1022    PT Time Calculation (min)  52 min    Activity Tolerance  Patient tolerated treatment well;Patient limited by pain    Behavior During Therapy  [facility] for tasks assessed/performed       Past Medical History:  Diagnosis Date  . Arm pain    left  . Degenerative disc disease   . Diabetes mellitus   . Fibromyalgia   . Hyperlipidemia   . Hypertension   . IBS (irritable bowel syndrome)   . Osteoporosis     Past Surgical History:  Procedure Laterality Date  . ABDOMINAL HYSTERECTOMY    . ABDOMINAL SURGERY    . APPENDECTOMY    . CHOLECYSTECTOMY    . CYST EXCISION    . OVARY SURGERY      There were no vitals filed for this visit.   Subjective Assessment - 02/19/18 0944    Subjective  She reports no injury and when putting clothes up she turned and had sharp pain that worsened.       Limitations  Lifting;House hold activities all activity with LT arm    Diagnostic tests  MRI labral tear.     Patient Stated Goals  Return to washing hair and doing home tasks as before, dress independently.     Currently in Pain?  Yes    Pain Score  3     Pain Location  Shoulder    Pain Orientation  Left;Anterior;Posterior  to elbow at times and traps    Pain Descriptors / Indicators  Burning;Radiating;Stabbing irritating    Pain Type  Chronic pain    Pain Radiating Towards  to elbow    Pain Onset  More than a month ago    Pain Frequency  Constant    Aggravating Factors   Using  Rt arm    Pain Relieving Factors  meds , patches, heat    Multiple Pain Sites  Yes         [facility] PT Assessment - 02/19/18 0001      Assessment   Medical Diagnosis  LT RTC syndrome    Referring Provider  Georgia Dom, MD      Onset Date/Surgical Date  -- 8 months or more    Hand Dominance  Right    Next MD Visit  03/09/18    Prior Therapy  No      Precautions   Precaution Comments  Don't do activity that cause pain      Restrictions   Weight Bearing Restrictions  No      Balance Screen   Has the patient fallen in the past 6 months  No      Prior Function   Level of Independence  Needs assistance with ADLs;Needs assistance with homemaking    Vocation  On disability      Cognition   Overall Cognitive Status  Within Functional Limits  for tasks assessed      Observation/Other Assessments   Focus on Therapeutic Outcomes (FOTO)   66% limited      ROM / Strength   AROM / PROM / Strength  AROM;Strength;PROM      AROM   AROM Assessment Site  Shoulder    Right/Left Shoulder  Left;Right    Right Shoulder Extension  65 Degrees    Right Shoulder Flexion  160 Degrees    Right Shoulder ABduction  150 Degrees    Right Shoulder Internal Rotation  40 Degrees    Right Shoulder External Rotation  90 Degrees    Right Shoulder Horizontal ABduction  15 Degrees    Right Shoulder Horizontal  ADduction  123 Degrees    Left Shoulder Extension  33 Degrees    Left Shoulder Flexion  110 Degrees    Left Shoulder ABduction  100 Degrees    Left Shoulder Internal Rotation  30 Degrees reaching behind back   10 inches less on Lt     Left Shoulder External Rotation  60 Degrees      PROM   PROM Assessment Site  Shoulder    Right/Left Shoulder  Left    Left Shoulder Flexion  120 Degrees      Strength   Overall Strength Comments  Grossly  WNL bilaterally    with pain on testing  shoulder.                 Objective measurements completed on examination: See above findings.       [facility] Adult PT Treatment/Exercise - 02/19/18 0001      Exercises   Exercises  Shoulder      Shoulder Exercises: Stretch   Other Shoulder Stretches  scapula retraction and cervical sidebend stretch      Manual Therapy   Manual Therapy  Taping    Kinesiotex  Inhibit Muscle      Kinesiotix   Inhibit Muscle   deltoid Y, scapula over shoulder strap and single deltois strap  ant/post             PT Education - 02/19/18 0943    Education Details  POC, stretching , management of tape removing if skin irritated, in 3 days if OK , can shower with tape and to not wear lotion to PT    Person(s) Educated  Patient    Methods  Explanation;Tactile cues;Verbal cues    Comprehension  Verbalized understanding;Returned demonstration       PT Short Term Goals - 02/19/18 1012      PT SHORT TERM GOAL #1   Title  She will be independent with initial HEP    Time  3    Period  Weeks    Status  New      PT SHORT TERM GOAL #2   Title  Active  Lt shoulder flexion to 130 degrees to work toward independent self care    Time  3    Period  Weeks    Status  New      PT SHORT TERM GOAL #3   Title  She will report pain decr 20% or more with general activity    Time  3    Period  Weeks    Status  New        PT Long Term Goals - 02/19/18 1014      PT LONG TERM GOAL #1   Title  She will be indpendent with all HEP issued  Time  6    Period  Weeks    Status  New      PT LONG TERM GOAL #2   Title  She will report pain decr 50% or more generally at rest    Time  6    Period  Weeks    Status  New      PT LONG TERM GOAL #3   Title  She will be able to wash hair without assistance     Time  6    Period  Weeks    Status  New      PT LONG TERM GOAL #4   Title  She will report periods of no pain..     Time  6    Period  Weeks    Status  New      PT LONG TERM GOAL #5   Title  She will be able to dress donning shirt with minimal pain    Time  6    Period  Weeks    Status   New      Additional Long Term Goals   Additional Long Term Goals  Yes      PT LONG TERM GOAL #6   Title  FOTO score improved to 50% or more to demo improvement    Time  6    Period  Weeks    Status  New             Plan - 02/19/18 1006    Clinical Impression Statement  Ms Shannon French reports 8 months or more  of LT shoulder pain. She reports a labral tear without injury. Her MMT was good. She reported significant incr pain with PROM only 5-10 degrees past asctive ranges.  She does not use her arm for self care and home tasks and has assistance for home tasks due to other issues.   Due to the labral tear her progrees  is uncertain.     History and Personal Factors relevant to plan of care:  Chronic pain     Clinical Presentation  Stable    Clinical Decision Making  Low    Rehab Potential  Fair    Clinical Impairments Affecting Rehab Potential  chronic pain , labral tear    PT Frequency  2x / week    PT Duration  6 weeks    PT Treatment/Interventions  Dry needling;Patient/family education;Therapeutic exercise;Manual techniques;Taping;Moist Heat;Therapeutic activities    PT Next Visit Plan  modalities , isometrics, manual    PT Home Exercise Plan  Scapula retraction, neck side bend stretch    Consulted and Agree with Plan of Care  Patient       Patient will benefit from skilled therapeutic intervention in order to improve the following deficits and impairments:  Pain, Decreased activity tolerance, Decreased range of motion, Postural dysfunction, Increased muscle spasms, Impaired UE functional use  Visit Diagnosis: Chronic left shoulder pain  Stiffness of left shoulder, not elsewhere classified     Problem List Patient Active Problem List   Diagnosis Date Noted  . Chronic pain of both shoulders 01/05/2018  . Chronic pain syndrome 01/05/2018  . CRPS (complex regional pain syndrome), type I, upper 10/27/2013  . Cervical disc disorder with radiculopathy of cervical region  10/27/2013  . Lumbosacral spondylosis without myelopathy 12/13/2011  . Muscle pain, fibromyalgia 12/13/2011  . Bilateral sacroiliitis (HCC) 12/13/2011  . Thoracic or lumbosacral neuritis or radiculitis, unspecified 12/13/2011    Caprice Redhasse, Yevette Knust M  PT 02/19/2018, 10:46 AM  [facility] [address] [place], [place], [address] Phone: [phone]   Fax:  [phone]  Name: Shannon French MRN: [id number] Date of Birth: 08-31-1965

## 2018-03-09 ENCOUNTER — Encounter: Payer: Self-pay | Admitting: Physical Medicine & Rehabilitation

## 2018-03-09 ENCOUNTER — Encounter: Payer: Medicare Other | Attending: Physical Medicine & Rehabilitation | Admitting: Physical Medicine & Rehabilitation

## 2018-03-09 ENCOUNTER — Ambulatory Visit: Payer: Medicare Other | Attending: Physical Medicine & Rehabilitation | Admitting: Physical Therapy

## 2018-03-09 ENCOUNTER — Encounter: Payer: Self-pay | Admitting: Physical Therapy

## 2018-03-09 VITALS — BP 134/100 | HR 97 | Ht 63.0 in | Wt 148.0 lb

## 2018-03-09 DIAGNOSIS — I10 Essential (primary) hypertension: Secondary | ICD-10-CM | POA: Diagnosis not present

## 2018-03-09 DIAGNOSIS — M7502 Adhesive capsulitis of left shoulder: Secondary | ICD-10-CM

## 2018-03-09 DIAGNOSIS — M461 Sacroiliitis, not elsewhere classified: Secondary | ICD-10-CM | POA: Diagnosis not present

## 2018-03-09 DIAGNOSIS — Z79899 Other long term (current) drug therapy: Secondary | ICD-10-CM

## 2018-03-09 DIAGNOSIS — Z5181 Encounter for therapeutic drug level monitoring: Secondary | ICD-10-CM | POA: Insufficient documentation

## 2018-03-09 DIAGNOSIS — M5010 Cervical disc disorder with radiculopathy, unspecified cervical region: Secondary | ICD-10-CM | POA: Insufficient documentation

## 2018-03-09 DIAGNOSIS — G8929 Other chronic pain: Secondary | ICD-10-CM | POA: Diagnosis present

## 2018-03-09 DIAGNOSIS — M47817 Spondylosis without myelopathy or radiculopathy, lumbosacral region: Secondary | ICD-10-CM | POA: Insufficient documentation

## 2018-03-09 DIAGNOSIS — G894 Chronic pain syndrome: Secondary | ICD-10-CM | POA: Insufficient documentation

## 2018-03-09 DIAGNOSIS — Z9889 Other specified postprocedural states: Secondary | ICD-10-CM | POA: Insufficient documentation

## 2018-03-09 DIAGNOSIS — M25612 Stiffness of left shoulder, not elsewhere classified: Secondary | ICD-10-CM | POA: Insufficient documentation

## 2018-03-09 DIAGNOSIS — Z809 Family history of malignant neoplasm, unspecified: Secondary | ICD-10-CM | POA: Insufficient documentation

## 2018-03-09 DIAGNOSIS — Z9049 Acquired absence of other specified parts of digestive tract: Secondary | ICD-10-CM | POA: Diagnosis not present

## 2018-03-09 DIAGNOSIS — Z90710 Acquired absence of both cervix and uterus: Secondary | ICD-10-CM | POA: Insufficient documentation

## 2018-03-09 DIAGNOSIS — M609 Myositis, unspecified: Secondary | ICD-10-CM | POA: Diagnosis not present

## 2018-03-09 DIAGNOSIS — M5416 Radiculopathy, lumbar region: Secondary | ICD-10-CM | POA: Insufficient documentation

## 2018-03-09 DIAGNOSIS — G9050 Complex regional pain syndrome I, unspecified: Secondary | ICD-10-CM | POA: Diagnosis not present

## 2018-03-09 DIAGNOSIS — E785 Hyperlipidemia, unspecified: Secondary | ICD-10-CM | POA: Diagnosis not present

## 2018-03-09 DIAGNOSIS — M810 Age-related osteoporosis without current pathological fracture: Secondary | ICD-10-CM | POA: Insufficient documentation

## 2018-03-09 DIAGNOSIS — M797 Fibromyalgia: Secondary | ICD-10-CM | POA: Insufficient documentation

## 2018-03-09 DIAGNOSIS — E119 Type 2 diabetes mellitus without complications: Secondary | ICD-10-CM | POA: Insufficient documentation

## 2018-03-09 DIAGNOSIS — M25512 Pain in left shoulder: Secondary | ICD-10-CM | POA: Insufficient documentation

## 2018-03-09 DIAGNOSIS — Z87891 Personal history of nicotine dependence: Secondary | ICD-10-CM | POA: Diagnosis not present

## 2018-03-09 DIAGNOSIS — K589 Irritable bowel syndrome without diarrhea: Secondary | ICD-10-CM | POA: Insufficient documentation

## 2018-03-09 MED ORDER — HYDROCODONE-ACETAMINOPHEN 7.5-325 MG PO TABS: 1.0000 | ORAL_TABLET | Freq: Four times a day (QID) | ORAL | 0 refills | Status: DC | PRN

## 2018-03-09 NOTE — Therapy (Signed)
[facility] [address] [place], [place], [address] Phone: [phone]   Fax:  [phone]  Physical Therapy Treatment  Patient Details  Name: Shannon French MRN: [id number] Date of Birth: 01-Jul-1966 Referring Provider: Georgia Dom, MD     Encounter Date: 03/09/2018  PT End of Session - 03/09/18 1721    Visit Number  2    Number of Visits  12    Date for PT Re-Evaluation  04/03/18    PT Start Time  1330    PT Stop Time  1415    PT Time Calculation (min)  45 min    Activity Tolerance  Patient tolerated treatment well    Behavior During Therapy  [facility] for tasks assessed/performed       Past Medical History:  Diagnosis Date  . Arm pain    left  . Degenerative disc disease   . Diabetes mellitus   . Fibromyalgia   . Hyperlipidemia   . Hypertension   . IBS (irritable bowel syndrome)   . Osteoporosis     Past Surgical History:  Procedure Laterality Date  . ABDOMINAL HYSTERECTOMY    . ABDOMINAL SURGERY    . APPENDECTOMY    . CHOLECYSTECTOMY    . CYST EXCISION    . OVARY SURGERY      There were no vitals filed for this visit.  Subjective Assessment - 03/09/18 1334    Subjective  I went to  [facility].  i had an injection  Shoulder.  I was in bed 3 days and nausea from inner ear issue   Sees MD thursday,  Just has pain management   move arm around.     Currently in Pain?  Yes    Pain Score  7     Pain Location  Shoulder    Pain Orientation  Left    Pain Descriptors / Indicators  Aching;Stabbing;Burning    Pain Radiating Towards  in muscle to elbow     hand gets ice cold    Pain Onset  More than a month ago    Pain Frequency  Constant    Aggravating Factors   anything    Pain Relieving Factors  meds,  heat    Effect of Pain on Daily Activities  not back to gym  for light exercises on machines. ADL needs help,  hair         [facility] PT Assessment - 03/09/18 0001      AROM   Left Shoulder Flexion  120 Degrees                    [facility] Adult PT Treatment/Exercise - 03/09/18 0001      Shoulder Exercises: Seated   Retraction  10 reps cued initiallly,  mild pain      Shoulder Exercises: Pulleys   Flexion  3 minutes    Scaption  3 minutes      Shoulder Exercises: Stretch   Other Shoulder Stretches  ER stretch arm on bed.      Other Shoulder Stretches  doorway stretch, single armER 3 x 30,      Manual Therapy   Manual therapy comments  attempted manual to obvious adhesions shoulder while patient on pulleys but stopped due to sharp pain.        Neck Exercises: Stretches   Upper Trapezius Stretch  3 reps;20 seconds    Levator Stretch  3 reps;20 seconds cued,  HEP  PT Education - 03/09/18 1721    Education Details  HEP    Person(s) Educated  Patient    Methods  Explanation;Demonstration;Verbal cues;Handout    Comprehension  Verbalized understanding;Returned demonstration       PT Short Term Goals - 02/19/18 1012      PT SHORT TERM GOAL #1   Title  She will be independent with initial HEP    Time  3    Period  Weeks    Status  New      PT SHORT TERM GOAL #2   Title  Active  Lt shoulder flexion to 130 degrees to work toward independent self care    Time  3    Period  Weeks    Status  New      PT SHORT TERM GOAL #3   Title  She will report pain decr 20% or more with general activity    Time  3    Period  Weeks    Status  New        PT Long Term Goals - 02/19/18 1014      PT LONG TERM GOAL #1   Title  She will be indpendent with all HEP issued    Time  6    Period  Weeks    Status  New      PT LONG TERM GOAL #2   Title  She will report pain decr 50% or more generally at rest    Time  6    Period  Weeks    Status  New      PT LONG TERM GOAL #3   Title  She will be able to wash hair without assistance     Time  6    Period  Weeks    Status  New      PT LONG TERM GOAL #4   Title  She will report periods of no pain..     Time  6     Period  Weeks    Status  New      PT LONG TERM GOAL #5   Title  She will be able to dress donning shirt with minimal pain    Time  6    Period  Weeks    Status  New      Additional Long Term Goals   Additional Long Term Goals  Yes      PT LONG TERM GOAL #6   Title  FOTO score improved to 50% or more to demo improvement    Time  6    Period  Weeks    Status  New            Plan - 03/09/18 1722    Clinical Impression Statement  Patient has been compliant with HEP.  She went to [facility] who  ordered PT.  3 X a week. Patient will continue at 2 x a week due to concerns for her finances.  She has pulleys at home.  She was able to progress HEP today with mild increases in pain.  She declined the need of modalities for pain.     PT Next Visit Plan  modalities , isometrics, manual    PT Home Exercise Plan  Scapula retraction, neck side bend stretch,  single arm doorway stretch and sitting ER stretch with arm on table    Consulted and Agree with Plan of Care  Patient  Patient will benefit from skilled therapeutic intervention in order to improve the following deficits and impairments:     Visit Diagnosis: Chronic left shoulder pain  Stiffness of left shoulder, not elsewhere classified     Problem List Patient Active Problem List   Diagnosis Date Noted  . Adhesive capsulitis of left shoulder 03/09/2018  . Chronic pain of both shoulders 01/05/2018  . Chronic pain syndrome 01/05/2018  . CRPS (complex regional pain syndrome), type I, upper 10/27/2013  . Cervical disc disorder with radiculopathy of cervical region 10/27/2013  . Lumbosacral spondylosis without myelopathy 12/13/2011  . Muscle pain, fibromyalgia 12/13/2011  . Bilateral sacroiliitis (HCC) 12/13/2011  . Thoracic or lumbosacral neuritis or radiculitis, unspecified 12/13/2011    Shatarra Wehling PTA 03/09/2018, 5:30 PM  [facility] [address] [place], [id number] Phone: [phone]   Fax:  [phone]  Name: Allena EaringSally Sinning MRN: [id number] Date of Birth: 11/24/1965

## 2018-03-09 NOTE — Patient Instructions (Addendum)
Shoulder (Scapula) Retraction    Pull shoulders back, squeezing shoulder blades together. Repeat _10___ times per session. Do _1-2___ sessions per day Position: Standing   May do in poolLevator Stretch     Turn head toward other side and  1-2___ sessions per day.  [email or web address]      .  Doorway stretch single arm 3 X 30 seconds   Arm on table  ER stretch  3 x 30  1-3 X a day 30 second holds.

## 2018-03-09 NOTE — Patient Instructions (Signed)
PLEASE FEEL FREE TO CALL OUR OFFICE WITH ANY PROBLEMS OR QUESTIONS ([phone])      

## 2018-03-09 NOTE — Progress Notes (Signed)
Subjective:    Patient ID: Shannon French, female    DOB: 26-Jun-1966, 52 y.o.   MRN: [id number]  HPI   Shannon French is here in follow up of her chronic pain. Her left shoulder MRI revealed the following:      1. Focal superior labral tear at the 12 to 1 o'clock position.     2. Increased soft tissue of the rotator cuff interval thickening of the inferior glenohumeral ligament. These findings have been associated with adhesive capsulitis.      3. Normal rotator cuff.   I referred her to PT. Her therapist advised that she see an orthopedic surgeon before initiating therapy. The pt saw Dr. Lynnette Caffey in [place] who agreed with therapy and injected her shoulder. The shoulder injection had no benefit.   She remains on hydrocodone as prescribed for pain. It does give her some relief.    Pain Inventory Average Pain 7 Pain Right Now 7 My pain is constant, sharp, burning, dull, stabbing, tingling and aching  In the last 24 hours, has pain interfered with the following? General activity 8 Relation with others 8 Enjoyment of life 8 What TIME of day is your pain at its worst? daytime Sleep (in general) Fair  Pain is worse with: walking, bending, sitting, inactivity, standing and some activites Pain improves with: rest, heat/ice, therapy/exercise, pacing activities, medication and injections Relief from Meds: 6  Mobility ability to climb steps?  yes do you drive?  yes  Function disabled: date disabled 2003  Neuro/Psych weakness numbness tingling spasms anxiety  Prior Studies Any changes since last visit?  no  Physicians involved in your care Any changes since last visit?  no   Family History  Problem Relation Age of Onset  . Cancer Father    Social History   Socioeconomic History  . Marital status: Single    Spouse name: Not on file  . Number of children: Not on file  . Years of education: Not on file  . Highest education level: Not on file  Occupational History  . Not on  file  Social Needs  . Financial resource strain: Not on file  . Food insecurity:    Worry: Not on file    Inability: Not on file  . Transportation needs:    Medical: Not on file    Non-medical: Not on file  Tobacco Use  . Smoking status: Former Smoker    Last attempt to quit: 10/14/2009    Years since quitting: 8.4  . Smokeless tobacco: Never Used  Substance and Sexual Activity  . Alcohol use: No  . Drug use: No  . Sexual activity: Not on file  Lifestyle  . Physical activity:    Days per week: Not on file    Minutes per session: Not on file  . Stress: Not on file  Relationships  . Social connections:    Talks on phone: Not on file    Gets together: Not on file    Attends religious service: Not on file    Active member of club or organization: Not on file    Attends meetings of clubs or organizations: Not on file    Relationship status: Not on file  Other Topics Concern  . Not on file  Social History Narrative  . Not on file   Past Surgical History:  Procedure Laterality Date  . ABDOMINAL HYSTERECTOMY    . ABDOMINAL SURGERY    . APPENDECTOMY    . CHOLECYSTECTOMY    .  CYST EXCISION    . OVARY SURGERY     Past Medical History:  Diagnosis Date  . Arm pain    left  . Degenerative disc disease   . Diabetes mellitus   . Fibromyalgia   . Hyperlipidemia   . Hypertension   . IBS (irritable bowel syndrome)   . Osteoporosis    BP (!) 134/100   Pulse 97   Ht 5\' 3"  (1.6 m)   Wt 148 lb (67.1 kg)   SpO2 97%   BMI 26.22 kg/m   Opioid Risk Score:   Fall Risk Score:  `1  Depression screen PHQ 2/9  Depression screen [facility] 2/9 07/22/2017 11/12/2016 08/12/2016 12/27/2015 11/01/2015 03/20/2015 11/04/2014  Decreased Interest 0 0 0 0 0 0 0  Down, Depressed, Hopeless 0 0 0 0 0 0 0  PHQ - 2 Score 0 0 0 0 0 0 0  Altered sleeping - - - - 0 - 3  Tired, decreased energy - - - - 0 - 1  Change in appetite - - - - 0 - 0  Feeling bad or failure about yourself  - - - - 0 - 0  Trouble  concentrating - - - - 0 - 0  Moving slowly or fidgety/restless - - - - 0 - 0  Suicidal thoughts - - - - 0 - 0  PHQ-9 Score - - - - 0 - 4     Review of Systems  Constitutional: Positive for diaphoresis.  HENT: Negative.   Eyes: Negative.   Respiratory: Positive for cough.   Cardiovascular: Negative.   Gastrointestinal: Negative.   Endocrine: Negative.   Genitourinary: Negative.   Musculoskeletal: Positive for arthralgias, back pain and myalgias.  Skin: Negative.   Allergic/Immunologic: Negative.   Neurological: Negative.   Hematological: Negative.   Psychiatric/Behavioral: Negative.   All other systems reviewed and are negative.      Objective:   Physical Exam General: No acute distress HEENT: EOMI, oral membranes moist Cards: reg rate  Chest: normal effort Abdomen: Soft, NT, ND Skin: dry, intact Extremities: no edema  Musculoskeletal: no allodynia left shoulder. She continues to have difficulty with active shoulder abduction. Continued pain with left shoulder A/PROM in ER and IR as well as AB. +crepitus with ROM. LB TTP.  Neurological:4/5 right ADF, APF, KE, HF. 4+/5 on leftgrossly. UE motor normal as is sensationis stable. UE DTR's are 1+.   Psychiatric: pleasant  .  Assessment & Plan:  ASSESSMENT:  1. Bilateral sacroiliac joint dysfunction, status post radio  frequencies which have been efficacious. Pain has increased substantially again.  2. Chronic pain syndrome consistent with fibromyalgia.  3. ?Right lumbar radiculopathy  3. Type 2 diabetes.  4. CRPS 1? LU E, Recent EMG/NCS --negative 5.Worsening left shoulder painconcerning for rotator cuff tendinitis. Also may have some bicipital tendon involvement as well.   PLAN:  1. Tizanidine for spasms and sleep. meloxicam prn for pain flares  2.Pending therapies left shoulder. Discussed need for daily ROM to left shoulder. Should take hydrocodone prior to HEP/therapy. May use a heating prior to  exercise also.  3. Hydrocodonewas refilled.We will continue the controlled substance monitoring program, this consists of regular clinic visits, examinations, routine drug screening, pill counts as well as use of [place] Cont[place]. [place].   -drug swab today 4. Consider followupSGNB's at some point. -also consider RF if symptoms dictate--stable at present 5. Continue elavillat 50mg  qhs---continue cymbalta at 90mg   -Continue  Tegretol 200mg  bid 6. Follow up withme in about two month. 15 minutes of face to face patient care time were spent during this visit. All questions were encouraged and answered.

## 2018-03-11 ENCOUNTER — Ambulatory Visit: Payer: Medicare Other

## 2018-03-11 DIAGNOSIS — M25512 Pain in left shoulder: Secondary | ICD-10-CM | POA: Diagnosis not present

## 2018-03-11 DIAGNOSIS — M25612 Stiffness of left shoulder, not elsewhere classified: Secondary | ICD-10-CM

## 2018-03-11 DIAGNOSIS — G8929 Other chronic pain: Secondary | ICD-10-CM

## 2018-03-11 NOTE — Therapy (Signed)
[facility] [address] [place], [place], [address] Phone: [phone]   Fax:  [phone]  Physical Therapy Treatment  Patient Details  Name: Shannon French MRN: [id number] Date of Birth: May 06, 1966 Referring Provider: Georgia Dom, MD     Encounter Date: 03/11/2018  PT End of Session - 03/11/18 0934    Visit Number  3    Number of Visits  12    Date for PT Re-Evaluation  04/03/18    Authorization Type  UHC MCR    PT Start Time  0930    PT Stop Time  1015    PT Time Calculation (min)  45 min    Activity Tolerance  Patient tolerated treatment well    Behavior During Therapy  [facility] for tasks assessed/performed       Past Medical History:  Diagnosis Date  . Arm pain    left  . Degenerative disc disease   . Diabetes mellitus   . Fibromyalgia   . Hyperlipidemia   . Hypertension   . IBS (irritable bowel syndrome)   . Osteoporosis     Past Surgical History:  Procedure Laterality Date  . ABDOMINAL HYSTERECTOMY    . ABDOMINAL SURGERY    . APPENDECTOMY    . CHOLECYSTECTOMY    . CYST EXCISION    . OVARY SURGERY      There were no vitals filed for this visit.  Subjective Assessment - 03/11/18 0939    Subjective  Steroid helped with movement . Doing exercises.  SOre yesterday. MD talked about tear and popping.     Pain Score  7     Pain Location  Shoulder    Pain Orientation  Left    Pain Descriptors / Indicators  Aching;Burning;Stabbing    Pain Type  Chronic pain    Pain Onset  More than a month ago    Aggravating Factors   activity    Pain Relieving Factors  meds and heat          [facility] PT Assessment - 03/11/18 0001      AROM   Left Shoulder Flexion  105 Degrees    Left Shoulder ABduction  110 Degrees    Left Shoulder Internal Rotation  -- 11 inche less than RT     Left Shoulder External Rotation  75 Degrees      Strength   Overall Strength Comments  Grossly  WNL bilaterally    with pain on testing   shoulder.                    [facility] Adult PT Treatment/Exercise - 03/11/18 0001      Shoulder Exercises: Standing   Other Standing Exercises  isometrics for home      Shoulder Exercises: Pulleys   Flexion  3 minutes      Shoulder Exercises: Isometric Strengthening   Flexion  5X5"    Extension  5X5"    External Rotation  5X5"    Internal Rotation  5X5"    ABduction  5X5"    ADduction  5X5"    Other Isometric Exercises  above done supine      Manual Therapy   Manual Therapy  Passive ROM    Passive ROM  All planes, distraction and ant/post gr2 mobs.               PT Education - 03/11/18 0959    Education Details  IsOMETRICS    Person(s)  Educated  Patient    Methods  Explanation;Demonstration;Tactile cues;Verbal cues;Handout    Comprehension  Returned demonstration;Verbalized understanding       PT Short Term Goals - 02/19/18 1012      PT SHORT TERM GOAL #1   Title  She will be independent with initial HEP    Time  3    Period  Weeks    Status  New      PT SHORT TERM GOAL #2   Title  Active  Lt shoulder flexion to 130 degrees to work toward independent self care    Time  3    Period  Weeks    Status  New      PT SHORT TERM GOAL #3   Title  She will report pain decr 20% or more with general activity    Time  3    Period  Weeks    Status  New        PT Long Term Goals - 02/19/18 1014      PT LONG TERM GOAL #1   Title  She will be indpendent with all HEP issued    Time  6    Period  Weeks    Status  New      PT LONG TERM GOAL #2   Title  She will report pain decr 50% or more generally at rest    Time  6    Period  Weeks    Status  New      PT LONG TERM GOAL #3   Title  She will be able to wash hair without assistance     Time  6    Period  Weeks    Status  New      PT LONG TERM GOAL #4   Title  She will report periods of no pain..     Time  6    Period  Weeks    Status  New      PT LONG TERM GOAL #5   Title  She will be  able to dress donning shirt with minimal pain    Time  6    Period  Weeks    Status  New      Additional Long Term Goals   Additional Long Term Goals  Yes      PT LONG TERM GOAL #6   Title  FOTO score improved to 50% or more to demo improvement    Time  6    Period  Weeks    Status  New            Plan - 03/11/18 1191    Clinical Impression Statement  PROM supine 95% -100 % normal but with pain. She was able to do the Isometrics correctly.       PT Treatment/Interventions  Dry needling;Patient/family education;Therapeutic exercise;Manual techniques;Taping;Moist Heat;Therapeutic activities    PT Next Visit Plan  modalities , isometrics, manual    PT Home Exercise Plan  Scapula retraction, neck side bend stretch,  single arm doorway stretch and sitting ER stretch with arm on table, isometrics    Consulted and Agree with Plan of Care  Patient       Patient will benefit from skilled therapeutic intervention in order to improve the following deficits and impairments:  Pain, Decreased activity tolerance, Decreased range of motion, Postural dysfunction, Increased muscle spasms, Impaired UE functional use  Visit Diagnosis: Chronic left shoulder pain  Stiffness of left  shoulder, not elsewhere classified     Problem List Patient Active Problem List   Diagnosis Date Noted  . Adhesive capsulitis of left shoulder 03/09/2018  . Chronic pain of both shoulders 01/05/2018  . Chronic pain syndrome 01/05/2018  . CRPS (complex regional pain syndrome), type I, upper 10/27/2013  . Cervical disc disorder with radiculopathy of cervical region 10/27/2013  . Lumbosacral spondylosis without myelopathy 12/13/2011  . Muscle pain, fibromyalgia 12/13/2011  . Bilateral sacroiliitis (HCC) 12/13/2011  . Thoracic or lumbosacral neuritis or radiculitis, unspecified 12/13/2011    Caprice RedChasse, Simran Bomkamp M  PT 03/11/2018, 10:13 AM  [facility] [address] [place], [place], [address] Phone: [phone]   Fax:  [phone]  Name: Allena EaringSally Wilk MRN: [id number] Date of Birth: 11/21/1965

## 2018-03-11 NOTE — Patient Instructions (Signed)
Strengthening: Isometric Flexion  Using wall for resistance, press right fist into ball using light pressure. Hold ____ seconds. Repeat ____ times per set. Do ____ sets per session. Do ____ sessions per day.  SHOULDER: Abduction (Isometric)  Use wall as resistance. Press arm against pillow. Keep elbow straight. Hold ___ seconds. ___ reps per set, ___ sets per day, ___ days per week  Extension (Isometric)  Place left bent elbow and back of arm against wall. Press elbow against wall. Hold ____ seconds. Repeat ____ times. Do ____ sessions per day.  Internal Rotation (Isometric)  Place palm of right fist against door frame, with elbow bent. Press fist against door frame. Hold ____ seconds. Repeat ____ times. Do ____ sessions per day.  External Rotation (Isometric)  Place back of left fist against door frame, with elbow bent. Press fist against door frame. Hold ____ seconds. Repeat ____ times. Do ____ sessions per day.  Copyright  VHI. All rights reserved.   ALL DONE 5-10 REPS   5-10 SEC HOLD 1-2 X PER DAY     PRESSURE TO TOLERANCE

## 2018-03-12 LAB — DRUG TOX MONITOR 1 W/CONF, ORAL FLD
AMPHETAMINES: NEGATIVE ng/mL (ref <10)
BARBITURATES: NEGATIVE ng/mL (ref <10)
Benzodiazepines: NEGATIVE ng/mL (ref <0.50)
Buprenorphine: NEGATIVE ng/mL (ref <0.10)
COCAINE: NEGATIVE ng/mL (ref <5.0)
COTININE: 111.3 ng/mL — AB (ref <5.0)
Codeine: NEGATIVE ng/mL (ref <2.5)
Dihydrocodeine: 5.8 ng/mL — ABNORMAL HIGH (ref <2.5)
FENTANYL: NEGATIVE ng/mL (ref <0.10)
HEROIN METABOLITE: NEGATIVE ng/mL (ref <1.0)
Hydrocodone: 53.4 ng/mL — ABNORMAL HIGH (ref <2.5)
Hydromorphone: NEGATIVE ng/mL (ref <2.5)
MARIJUANA: NEGATIVE ng/mL (ref <2.5)
MDMA: NEGATIVE ng/mL (ref <10)
MEPROBAMATE: NEGATIVE ng/mL (ref <2.5)
Methadone: NEGATIVE ng/mL (ref <5.0)
Morphine: NEGATIVE ng/mL (ref <2.5)
NICOTINE METABOLITE: POSITIVE ng/mL — AB (ref <5.0)
NORHYDROCODONE: 10 ng/mL — AB (ref <2.5)
NOROXYCODONE: NEGATIVE ng/mL (ref <2.5)
OPIATES: POSITIVE ng/mL — AB (ref <2.5)
OXYCODONE: NEGATIVE ng/mL (ref <2.5)
OXYMORPHONE: NEGATIVE ng/mL (ref <2.5)
PHENCYCLIDINE: NEGATIVE ng/mL (ref <10)
Tapentadol: NEGATIVE ng/mL (ref <5.0)
Tramadol: NEGATIVE ng/mL (ref <5.0)
Zolpidem: NEGATIVE ng/mL (ref <5.0)

## 2018-03-12 LAB — DRUG TOX ALC METAB W/CON, ORAL FLD: Alcohol Metabolite: NEGATIVE ng/mL (ref <25)

## 2018-03-13 ENCOUNTER — Telehealth: Payer: Self-pay | Admitting: *Deleted

## 2018-03-13 NOTE — Telephone Encounter (Signed)
Oral swab drug screen was consistent for prescribed medications.  ?

## 2018-03-15 ENCOUNTER — Other Ambulatory Visit: Payer: Self-pay | Admitting: Physical Medicine & Rehabilitation

## 2018-03-15 DIAGNOSIS — Z5181 Encounter for therapeutic drug level monitoring: Secondary | ICD-10-CM

## 2018-03-15 DIAGNOSIS — M25512 Pain in left shoulder: Principal | ICD-10-CM

## 2018-03-15 DIAGNOSIS — M47817 Spondylosis without myelopathy or radiculopathy, lumbosacral region: Secondary | ICD-10-CM

## 2018-03-15 DIAGNOSIS — G8929 Other chronic pain: Secondary | ICD-10-CM

## 2018-03-15 DIAGNOSIS — G90512 Complex regional pain syndrome I of left upper limb: Secondary | ICD-10-CM

## 2018-03-15 DIAGNOSIS — Z79899 Other long term (current) drug therapy: Secondary | ICD-10-CM

## 2018-03-15 DIAGNOSIS — M542 Cervicalgia: Secondary | ICD-10-CM

## 2018-03-15 DIAGNOSIS — G894 Chronic pain syndrome: Secondary | ICD-10-CM

## 2018-03-16 ENCOUNTER — Ambulatory Visit: Payer: Medicare Other

## 2018-03-16 DIAGNOSIS — M25512 Pain in left shoulder: Principal | ICD-10-CM

## 2018-03-16 DIAGNOSIS — M25612 Stiffness of left shoulder, not elsewhere classified: Secondary | ICD-10-CM

## 2018-03-16 DIAGNOSIS — G8929 Other chronic pain: Secondary | ICD-10-CM

## 2018-03-16 NOTE — Therapy (Signed)
[facility] [address] [place], [place], [address] Phone: [phone]   Fax:  [phone]  Physical Therapy Treatment  Patient Details  Name: Shannon French MRN: [id number] Date of Birth: 11/28/1965 Referring Provider: Georgia DomZachary Scwartz, MD     Encounter Date: 03/16/2018  PT End of Session - 03/16/18 1022    Visit Number  4    Number of Visits  12    Date for PT Re-Evaluation  04/03/18    Authorization Type  UHC MCR    PT Start Time  1021   LAte   PT Stop Time  1100    PT Time Calculation (min)  39 min    Activity Tolerance  Patient tolerated treatment well    Behavior During Therapy  [facility] for tasks assessed/performed       Past Medical History:  Diagnosis Date  . Arm pain    left  . Degenerative disc disease   . Diabetes mellitus   . Fibromyalgia   . Hyperlipidemia   . Hypertension   . IBS (irritable bowel syndrome)   . Osteoporosis     Past Surgical History:  Procedure Laterality Date  . ABDOMINAL HYSTERECTOMY    . ABDOMINAL SURGERY    . APPENDECTOMY    . CHOLECYSTECTOMY    . CYST EXCISION    . OVARY SURGERY      There were no vitals filed for this visit.  Subjective Assessment - 03/16/18 1024    Subjective  Brushed hair this weekend but with modifications    Pain Score  6     Pain Location  Shoulder    Pain Descriptors / Indicators  Aching;Burning    Pain Type  Chronic pain    Pain Onset  More than a month ago    Pain Frequency  Constant    Aggravating Factors   using arm    Pain Relieving Factors  meds heat                       [facility] Adult PT Treatment/Exercise - 03/16/18 0001      Shoulder Exercises: Supine   Protraction  Left;15 reps    Protraction Limitations  then 15 reps circles con and eccentric.     Horizontal ABduction Weight (lbs)  3   then 2 and 1 pound 5 reps each   External Rotation  --      Shoulder Exercises: Sidelying   External Rotation  Left;5 reps    External Rotation Weight (lbs)  3   then 2 then 1 5 reps each   Other Sidelying Exercises  scapula diagonals x 10      Shoulder Exercises: Pulleys   Flexion  3 minutes      Shoulder Exercises: ROM/Strengthening   UBE (Upper Arm Bike)  4 min forward      Shoulder Exercises: Isometric Strengthening   Flexion  5X5"    Extension  5X5"    External Rotation  5X5"    Internal Rotation  5X5"    ABduction  5X5"    ADduction  5X5"      Manual Therapy   Passive ROM  All planes, distraction and ant/post gr 3 mobs all full passive ROM with end range pain.                 PT Short Term Goals - 02/19/18 1012      PT SHORT TERM GOAL #1   Title  She will be independent with initial HEP    Time  3    Period  Weeks    Status  New      PT SHORT TERM GOAL #2   Title  Active  Lt shoulder flexion to 130 degrees to work toward independent self care    Time  3    Period  Weeks    Status  New      PT SHORT TERM GOAL #3   Title  She will report pain decr 20% or more with general activity    Time  3    Period  Weeks    Status  New        PT Long Term Goals - 02/19/18 1014      PT LONG TERM GOAL #1   Title  She will be indpendent with all HEP issued    Time  6    Period  Weeks    Status  New      PT LONG TERM GOAL #2   Title  She will report pain decr 50% or more generally at rest    Time  6    Period  Weeks    Status  New      PT LONG TERM GOAL #3   Title  She will be able to wash hair without assistance     Time  6    Period  Weeks    Status  New      PT LONG TERM GOAL #4   Title  She will report periods of no pain..     Time  6    Period  Weeks    Status  New      PT LONG TERM GOAL #5   Title  She will be able to dress donning shirt with minimal pain    Time  6    Period  Weeks    Status  New      Additional Long Term Goals   Additional Long Term Goals  Yes      PT LONG TERM GOAL #6   Title  FOTO score improved to 50% or more to demo improvement     Time  6    Period  Weeks    Status  New            Plan - 03/16/18 1022    Clinical Impression Statement  PROm 100% passively.  She was able to tolerate some weight though painful. Decline heat to be done at home    History and Personal Factors relevant to plan of care:  .      PT Treatment/Interventions  Dry needling;Patient/family education;Therapeutic exercise;Manual techniques;Taping;Moist Heat;Therapeutic activities    PT Next Visit Plan  modalities , isometrics, manual.  Add some band or weight exer    PT Home Exercise Plan  Scapula retraction, neck side bend stretch,  single arm doorway stretch and sitting ER stretch with arm on table, isometrics    Consulted and Agree with Plan of Care  Patient       Patient will benefit from skilled therapeutic intervention in order to improve the following deficits and impairments:  Pain, Decreased activity tolerance, Decreased range of motion, Postural dysfunction, Increased muscle spasms, Impaired UE functional use  Visit Diagnosis: Chronic left shoulder pain  Stiffness of left shoulder, not elsewhere classified     Problem List Patient Active Problem List   Diagnosis Date Noted  . Adhesive capsulitis  of left shoulder 03/09/2018  . Chronic pain of both shoulders 01/05/2018  . Chronic pain syndrome 01/05/2018  . CRPS (complex regional pain syndrome), type I, upper 10/27/2013  . Cervical disc disorder with radiculopathy of cervical region 10/27/2013  . Lumbosacral spondylosis without myelopathy 12/13/2011  . Muscle pain, fibromyalgia 12/13/2011  . Bilateral sacroiliitis (HCC) 12/13/2011  . Thoracic or lumbosacral neuritis or radiculitis, unspecified 12/13/2011    Shannon French, Shannon French,  PT 03/16/2018, 11:02 AM  [facility] [address] [place], [place], [address] Phone: [phone]   Fax:  [phone]  Name: Shannon French MRN: [id number] Date of Birth: 01/05/1966

## 2018-03-18 ENCOUNTER — Ambulatory Visit: Payer: Medicare Other | Admitting: Physical Therapy

## 2018-03-23 ENCOUNTER — Ambulatory Visit: Payer: Medicare Other

## 2018-03-23 DIAGNOSIS — M25612 Stiffness of left shoulder, not elsewhere classified: Secondary | ICD-10-CM

## 2018-03-23 DIAGNOSIS — G8929 Other chronic pain: Secondary | ICD-10-CM

## 2018-03-23 DIAGNOSIS — M25512 Pain in left shoulder: Principal | ICD-10-CM

## 2018-03-23 NOTE — Therapy (Signed)
[facility] [address] [place], [place], [address] Phone: [phone]   Fax:  [phone]  Physical Therapy Treatment  Patient Details  Name: Shannon EaringSally French MRN: [id number] Date of Birth: 08/11/1965 Referring Provider: Georgia DomZachary Scwartz, MD     Encounter Date: 03/23/2018  PT End of Session - 03/23/18 0933    Visit Number  5    Number of Visits  12    Date for PT Re-Evaluation  04/03/18    PT Start Time  0930    PT Stop Time  1015    PT Time Calculation (min)  45 min    Activity Tolerance  Patient tolerated treatment well    Behavior During Therapy  [facility] for tasks assessed/performed       Past Medical History:  Diagnosis Date  . Arm pain    left  . Degenerative disc disease   . Diabetes mellitus   . Fibromyalgia   . Hyperlipidemia   . Hypertension   . IBS (irritable bowel syndrome)   . Osteoporosis     Past Surgical History:  Procedure Laterality Date  . ABDOMINAL HYSTERECTOMY    . ABDOMINAL SURGERY    . APPENDECTOMY    . CHOLECYSTECTOMY    . CYST EXCISION    . OVARY SURGERY      There were no vitals filed for this visit.  Subjective Assessment - 03/23/18 0935    Subjective  Washed hair today but did not brush. No pain but took extra pain meds    Currently in Pain?  No/denies                       [facility] - 03/23/18 0001      Shoulder Exercises: Supine   Protraction  Left;12 reps    Protraction Weight (lbs)  4    Protraction Limitations  then 15 reps circles con and eccentric.     Horizontal ABduction  Both;12 reps    Horizontal ABduction Weight (lbs)  1      Shoulder Exercises: Sidelying   External Rotation  Left;10 reps    External Rotation Weight (lbs)  3      Shoulder Exercises: Standing   External Rotation  Strengthening;Left;10 reps    Theraband Level (Shoulder External Rotation)  Level 1 (Yellow)    Internal Rotation  Left;10 reps    Theraband Level  (Shoulder Internal Rotation)  Level 1 (Yellow)    Flexion  Left;10 reps;Theraband    Theraband Level (Shoulder Flexion)  Level 1 (Yellow)    ABduction  Left;10 reps    Theraband Level (Shoulder ABduction)  Level 1 (Yellow)    Extension  Left;5 reps    Theraband Level (Shoulder Extension)  Level 1 (Yellow)    Row  Left;12 reps    Theraband Level (Shoulder Row)  Level 1 (Yellow)      Shoulder Exercises: Pulleys   Flexion  3 minutes      Shoulder Exercises: ROM/Strengthening   UBE (Upper Arm Bike)  5 min forward             PT Education - 03/23/18 0951    Education Details  [email or web address]     Person(s) Educated  Patient    Methods  Explanation;Demonstration;Verbal cues;Handout    Comprehension  Returned demonstration;Verbalized understanding       PT Short Term Goals - 03/23/18 0934      PT SHORT TERM GOAL #1  Title  She will be independent with initial HEP    Status  Achieved      PT SHORT TERM GOAL #2   Title  Active  Lt shoulder flexion to 130 degrees to work toward independent self care    Baseline  132    Status  Achieved      PT SHORT TERM GOAL #3   Title  She will report pain decr 20% or more with general activity    Status  Achieved        PT Long Term Goals - 02/19/18 1014      PT LONG TERM GOAL #1   Title  She will be indpendent with all HEP issued    Time  6    Period  Weeks    Status  New      PT LONG TERM GOAL #2   Title  She will report pain decr 50% or more generally at rest    Time  6    Period  Weeks    Status  New      PT LONG TERM GOAL #3   Title  She will be able to wash hair without assistance     Time  6    Period  Weeks    Status  New      PT LONG TERM GOAL #4   Title  She will report periods of no pain..     Time  6    Period  Weeks    Status  New      PT LONG TERM GOAL #5   Title  She will be able to dress donning shirt with minimal pain    Time  6    Period  Weeks    Status  New      Additional Long Term Goals    Additional Long Term Goals  Yes      PT LONG TERM GOAL #6   Title  FOTO score improved to 50% or more to demo improvement    Time  6    Period  Weeks    Status  New            Plan - 03/23/18 0934    Clinical Impression Statement  She is progressing with decr pain and active ROm and strengthening. She relies on meds for pain control.  Will progress over next 3 weeks with strengthening    PT Treatment/Interventions  Dry needling;Patient/family education;Therapeutic exercise;Manual techniques;Taping;Moist Heat;Therapeutic activities    PT Next Visit Plan  modalities , isometrics, manual.  Add some band or weight exer    PT Home Exercise Plan  Scapula retraction, neck side bend stretch,  single arm doorway stretch and sitting ER stretch with arm on table, isometrics    Consulted and Agree with Plan of Care  Patient       Patient will benefit from skilled therapeutic intervention in order to improve the following deficits and impairments:  Pain, Decreased activity tolerance, Decreased range of motion, Postural dysfunction, Increased muscle spasms, Impaired UE functional use  Visit Diagnosis: Chronic left shoulder pain  Stiffness of left shoulder, not elsewhere classified     Problem List Patient Active Problem List   Diagnosis Date Noted  . Adhesive capsulitis of left shoulder 03/09/2018  . Chronic pain of both shoulders 01/05/2018  . Chronic pain syndrome 01/05/2018  . CRPS (complex regional pain syndrome), type I, upper 10/27/2013  . Cervical disc disorder with radiculopathy of  cervical region 10/27/2013  . Lumbosacral spondylosis without myelopathy 12/13/2011  . Muscle pain, fibromyalgia 12/13/2011  . Bilateral sacroiliitis (HCC) 12/13/2011  . Thoracic or lumbosacral neuritis or radiculitis, unspecified 12/13/2011    Caprice RedChasse, Aditri Louischarles M PT 03/23/2018, 10:11 AM  [facility] [address] [place], [place],  [address] Phone: [phone]   Fax:  [phone]  Name: Shannon EaringSally Grewe MRN: [id number] Date of Birth: 11/20/1965

## 2018-03-23 NOTE — Patient Instructions (Signed)
Strengthening: Resisted Internal Rotation   Hold tubing in left hand, elbow at side and forearm out. Rotate forearm in across body. Repeat ____ times per set. Do ____ sets per session. Do ____ sessions per day.  http://orth.exer.us/830   Copyright  VHI. All rights reserved.  Strengthening: Resisted External Rotation   Hold tubing in right hand, elbow at side and forearm across body. Rotate forearm out. Repeat ____ times per set. Do ____ sets per session. Do ____ sessions per day.  http://orth.exer.us/828   Copyright  VHI. All rights reserved.  Strengthening: Resisted Flexion   Hold tubing with left arm at side. Pull forward and up. Move shoulder through pain-free range of motion. Repeat ____ times per set. Do ____ sets per session. Do ____ sessions per day.  http://orth.exer.us/824   Copyright  VHI. All rights reserved.  Strengthening: Resisted Extension   Hold tubing in right hand, arm forward. Pull arm back, elbow straight. Repeat ____ times per set. Do ____ sets per session. Do ____ sessions per day.  http://orth.exer.us/832   Copyright  VHI. All rights reserved.  ALL DONE 10-15 REPS YELLOW BAND

## 2018-03-25 ENCOUNTER — Ambulatory Visit: Payer: Medicare Other | Admitting: Physical Therapy

## 2018-03-25 ENCOUNTER — Encounter: Payer: Self-pay | Admitting: Physical Therapy

## 2018-03-25 DIAGNOSIS — M25512 Pain in left shoulder: Secondary | ICD-10-CM | POA: Diagnosis not present

## 2018-03-25 DIAGNOSIS — M25612 Stiffness of left shoulder, not elsewhere classified: Secondary | ICD-10-CM

## 2018-03-25 DIAGNOSIS — G8929 Other chronic pain: Secondary | ICD-10-CM

## 2018-03-25 NOTE — Therapy (Signed)
[facility] [address] [place], [place], [address] Phone: [phone]   Fax:  [phone]  Physical Therapy Treatment  Patient Details  Name: Shannon French MRN: [id number] Date of Birth: 1966/07/09 Referring Provider: Georgia Dom, MD     Encounter Date: 03/25/2018  PT End of Session - 03/25/18 1020    Visit Number  6    Number of Visits  12    Date for PT Re-Evaluation  04/03/18    PT Start Time  0935    PT Stop Time  1030    PT Time Calculation (min)  55 min    Activity Tolerance  Patient tolerated treatment well    Behavior During Therapy  [facility] for tasks assessed/performed       Past Medical History:  Diagnosis Date  . Arm pain    left  . Degenerative disc disease   . Diabetes mellitus   . Fibromyalgia   . Hyperlipidemia   . Hypertension   . IBS (irritable bowel syndrome)   . Osteoporosis     Past Surgical History:  Procedure Laterality Date  . ABDOMINAL HYSTERECTOMY    . ABDOMINAL SURGERY    . APPENDECTOMY    . CHOLECYSTECTOMY    . CYST EXCISION    . OVARY SURGERY      There were no vitals filed for this visit.  Subjective Assessment - 03/25/18 0941    Subjective  Sore in arm probably form new exercises.     Currently in Pain?  Yes    Pain Score  7     Pain Location  Shoulder    Pain Orientation  Left    Pain Descriptors / Indicators  Aching    Pain Type  Chronic pain    Pain Radiating Towards  down back    Pain Frequency  Constant    Aggravating Factors   New exercises    Pain Relieving Factors  meds heat                       [facility] Adult PT Treatment/Exercise - 03/25/18 0001      Shoulder Exercises: Supine   Protraction  Left;15 reps    Protraction Weight (lbs)  2       Shoulder Exercises: Sidelying   External Rotation  Left;15 reps   also 10 X 0 LBS   External Rotation Weight (lbs)  3   pad at elbow     Shoulder Exercises: Standing   External Rotation   Strengthening;Left;10 reps    Theraband Level (Shoulder External Rotation)  Level 1 (Yellow)    Internal Rotation  Left;10 reps    Theraband Level (Shoulder Internal Rotation)  Level 1 (Yellow)    Flexion  Left;10 reps;Theraband    Theraband Level (Shoulder Flexion)  Level 1 (Yellow)    ABduction  Left;10 reps    Theraband Level (Shoulder ABduction)  Level 1 (Yellow)    Extension  Left;5 reps    Theraband Level (Shoulder Extension)  Level 1 (Yellow)   and 5 without band   Row  Left;12 reps    Theraband Level (Shoulder Row)  Level 1 (Yellow)    Other Standing Exercises  wall push up 10 X      Shoulder Exercises: Pulleys   Flexion  3 minutes    Scaption  --   inclued in the 3 minutes     Shoulder Exercises: ROM/Strengthening   UBE (Upper Arm Bike)  5 min forward      Modalities   Modalities  Moist Heat      Moist Heat Therapy   Number Minutes Moist Heat  15 Minutes    Moist Heat Location  Shoulder      Manual Therapy   Manual Therapy  Passive ROM    Manual therapy comments  patient's request,  4 wat,  ROM WNL  with stretch             PT Education - 03/25/18 1020    Education Details  anatomy    Methods  Explanation    Comprehension  Verbalized understanding       PT Short Term Goals - 03/23/18 0934      PT SHORT TERM GOAL #1   Title  She will be independent with initial HEP    Status  Achieved      PT SHORT TERM GOAL #2   Title  Active  Lt shoulder flexion to 130 degrees to work toward independent self care    Baseline  132    Status  Achieved      PT SHORT TERM GOAL #3   Title  She will report pain decr 20% or more with general activity    Status  Achieved        PT Long Term Goals - 02/19/18 1014      PT LONG TERM GOAL #1   Title  She will be indpendent with all HEP issued    Time  6    Period  Weeks    Status  New      PT LONG TERM GOAL #2   Title  She will report pain decr 50% or more generally at rest    Time  6    Period  Weeks     Status  New      PT LONG TERM GOAL #3   Title  She will be able to wash hair without assistance     Time  6    Period  Weeks    Status  New      PT LONG TERM GOAL #4   Title  She will report periods of no pain..     Time  6    Period  Weeks    Status  New      PT LONG TERM GOAL #5   Title  She will be able to dress donning shirt with minimal pain    Time  6    Period  Weeks    Status  New      Additional Long Term Goals   Additional Long Term Goals  Yes      PT LONG TERM GOAL #6   Title  FOTO score improved to 50% or more to demo improvement    Time  6    Period  Weeks    Status  New            Plan - 03/25/18 1020    Clinical Impression Statement  Patient is sore from new exercises .  She was able to exercise with soreness vs pain.  5/10 at end of session.  PROM  WNL shoulder left.     PT Next Visit Plan  modalities , isometrics, manual. Continuesome band or weight exer,  consider red band if ready    PT Home Exercise Plan  Scapula retraction, neck side bend stretch,  single arm doorway stretch and sitting ER stretch  with arm on table, isometrics    Consulted and Agree with Plan of Care  Patient       Patient will benefit from skilled therapeutic intervention in order to improve the following deficits and impairments:     Visit Diagnosis: Chronic left shoulder pain  Stiffness of left shoulder, not elsewhere classified     Problem List Patient Active Problem List   Diagnosis Date Noted  . Adhesive capsulitis of left shoulder 03/09/2018  . Chronic pain of both shoulders 01/05/2018  . Chronic pain syndrome 01/05/2018  . CRPS (complex regional pain syndrome), type I, upper 10/27/2013  . Cervical disc disorder with radiculopathy of cervical region 10/27/2013  . Lumbosacral spondylosis without myelopathy 12/13/2011  . Muscle pain, fibromyalgia 12/13/2011  . Bilateral sacroiliitis (HCC) 12/13/2011  . Thoracic or lumbosacral neuritis or radiculitis,  unspecified 12/13/2011    Armin Yerger PTA 03/25/2018, 10:23 AM  [facility] [address] [place], [place], [address] Phone: [phone]   Fax:  [phone]  Name: Allena EaringSally Shiley MRN: [id number] Date of Birth: 02/22/1966

## 2018-03-30 ENCOUNTER — Ambulatory Visit: Payer: Medicare Other | Admitting: Physical Therapy

## 2018-04-01 ENCOUNTER — Encounter: Payer: Self-pay | Admitting: Physical Therapy

## 2018-04-01 ENCOUNTER — Ambulatory Visit: Payer: Medicare Other | Admitting: Physical Therapy

## 2018-04-01 DIAGNOSIS — M25612 Stiffness of left shoulder, not elsewhere classified: Secondary | ICD-10-CM

## 2018-04-01 DIAGNOSIS — M25512 Pain in left shoulder: Secondary | ICD-10-CM | POA: Diagnosis not present

## 2018-04-01 DIAGNOSIS — G8929 Other chronic pain: Secondary | ICD-10-CM

## 2018-04-01 NOTE — Therapy (Signed)
[place], [place], [address] Phone: [phone]   Fax:  [phone]  Physical Therapy Treatment  Patient Details  Name: Shannon French MRN: [id number] Date of Birth: 08-23-1965 Referring Provider: Rosanna Randy, MD     Encounter Date: 04/01/2018  PT End of Session - 04/01/18 1024    Visit Number  7    Number of Visits  12    Date for PT Re-Evaluation  04/03/18    PT Start Time  0933    PT Stop Time  1016    PT Time Calculation (min)  43 min    Activity Tolerance  Patient tolerated treatment well    Behavior During Therapy  [facility] for tasks assessed/performed       Past Medical History:  Diagnosis Date  . Arm pain    left  . Degenerative disc disease   . Diabetes mellitus   . Fibromyalgia   . Hyperlipidemia   . Hypertension   . IBS (irritable bowel syndrome)   . Osteoporosis     Past Surgical History:  Procedure Laterality Date  . ABDOMINAL HYSTERECTOMY    . ABDOMINAL SURGERY    . APPENDECTOMY    . CHOLECYSTECTOMY    . CYST EXCISION    . OVARY SURGERY      There were no vitals filed for this visit.  Subjective Assessment - 04/01/18 0937    Subjective  Shoulder is doing well.  Did have spasms in mid back on Sunday.  It is too cold  to get into the pool.      Currently in Pain?  Yes    Pain Score  6     Pain Descriptors / Indicators  Tightness    Aggravating Factors   tight in the morning    Pain Relieving Factors  meds,  heat,   stretches    Multiple Pain Sites  --   has mid back spasm        [facility] PT Assessment - 04/01/18 0001      AROM   Left Shoulder Extension  52 Degrees    Left Shoulder Flexion  141 Degrees    Left Shoulder ABduction  112 Degrees                   [facility] Adult PT Treatment/Exercise - 04/01/18 0001      Shoulder Exercises: Standing   External Rotation  Strengthening;Left;10 reps    Theraband Level (Shoulder External Rotation)  Level 2 (Red)     Internal Rotation  Left;10 reps   2 sets   Theraband Level (Shoulder Internal Rotation)  Level 2 (Red)    Flexion  Left;10 reps;Theraband   2 sets   Theraband Level (Shoulder Flexion)  Level 2 (Red)    ABduction  Left;10 reps   2 sets   Theraband Level (Shoulder ABduction)  Level 2 (Red)    Extension  Left   10 reps   Theraband Level (Shoulder Extension)  Level 2 (Red)   and 5 without band   Row  Left;12 reps    Theraband Level (Shoulder Row)  Level 2 (Red)    Other Standing Exercises  wall push up 10 X2 sets      Shoulder Exercises: Pulleys   Flexion  3 minutes    Scaption  --   inclued in the 3 minutes     Shoulder Exercises: ROM/Strengthening   UBE (Upper Arm Bike)  5 min  L  1.3             PT Education - 04/01/18 1023    Education Details  Exercise form    Person(s) Educated  Patient    Methods  Explanation;Demonstration    Comprehension  Verbalized understanding;Returned demonstration       PT Short Term Goals - 03/23/18 0934      PT SHORT TERM GOAL #1   Title  She will be independent with initial HEP    Status  Achieved      PT SHORT TERM GOAL #2   Title  Active  Lt shoulder flexion to 130 degrees to work toward independent self care    Baseline  132    Status  Achieved      PT SHORT TERM GOAL #3   Title  She will report pain decr 20% or more with general activity    Status  Achieved        PT Long Term Goals - 04/01/18 1029      PT LONG TERM GOAL #1   Title  She will be indpendent with all HEP issued    Baseline  independent so far with HEP    Time  6    Period  Weeks    Status  On-going      PT LONG TERM GOAL #2   Title  She will report pain decr 50% or more generally at rest    Baseline  no change    Time  6    Period  Weeks    Status  On-going      PT LONG TERM GOAL #3   Title  She will be able to wash hair without assistance     Baseline  able    Time  6    Period  Weeks    Status  Achieved      PT LONG TERM GOAL #4    Title  She will report periods of no pain..     Time  6    Period  Weeks    Status  Unable to assess      PT LONG TERM GOAL #5   Title  She will be able to dress donning shirt with minimal pain    Baseline  able    Time  6    Period  Weeks    Status  Achieved      PT LONG TERM GOAL #6   Title  FOTO score improved to 50% or more to demo improvement    Time  6    Period  Weeks    Status  Unable to assess            Plan - 04/01/18 1025    Clinical Impression Statement  Patient was able to progress to red band without increased pain.  ROM improving see flow sheet.   LTG#3, #5 met.  She declined the need for modalities for pain.  POC through 04/03/2018 she will need an ERO to extend.    PT Next Visit Plan  ERO next visit.  Continue strengthening with red band.  Needs more Abduction.    PT Home Exercise Plan  Scapula retraction, neck side bend stretch,  single arm doorway stretch and sitting ER stretch with arm on table, isometrics,  shoulder bands,  red.    Consulted and Agree with Plan of Care  Patient       Patient will benefit from skilled therapeutic  intervention in order to improve the following deficits and impairments:     Visit Diagnosis: Chronic left shoulder pain  Stiffness of left shoulder, not elsewhere classified     Problem List Patient Active Problem List   Diagnosis Date Noted  . Adhesive capsulitis of left shoulder 03/09/2018  . Chronic pain of both shoulders 01/05/2018  . Chronic pain syndrome 01/05/2018  . CRPS (complex regional pain syndrome), type I, upper 10/27/2013  . Cervical disc disorder with radiculopathy of cervical region 10/27/2013  . Lumbosacral spondylosis without myelopathy 12/13/2011  . Muscle pain, fibromyalgia 12/13/2011  . Bilateral sacroiliitis ([place]) 12/13/2011  . Thoracic or lumbosacral neuritis or radiculitis, unspecified 12/13/2011    Milayna Rotenberg  PTA 04/01/2018, 10:32 AM  [place], [place], [address] Phone: [phone]   Fax:  [phone]  Name: Shannon French MRN: [id number] Date of Birth: 1965/08/30

## 2018-04-02 ENCOUNTER — Encounter: Payer: Self-pay | Admitting: Physical Therapy

## 2018-04-02 ENCOUNTER — Ambulatory Visit: Payer: Medicare Other | Admitting: Physical Therapy

## 2018-04-02 DIAGNOSIS — M25512 Pain in left shoulder: Principal | ICD-10-CM

## 2018-04-02 DIAGNOSIS — G8929 Other chronic pain: Secondary | ICD-10-CM

## 2018-04-02 DIAGNOSIS — M25612 Stiffness of left shoulder, not elsewhere classified: Secondary | ICD-10-CM

## 2018-04-02 NOTE — Therapy (Signed)
[facility] [address] [place], [place], [address] Phone: [phone]   Fax:  [phone]  Physical Therapy Treatment/ Reassessment  Patient Details  Name: Shannon French MRN: [id number] Date of Birth: 1966-05-27 Referring Provider: Faith Rogue MD   Encounter Date: 04/02/2018  PT End of Session - 04/02/18 0942    Visit Number  8    Number of Visits  16    Date for PT Re-Evaluation  06/02/18    Authorization Type  [employer] M[employer], applied for 8 additional visits and date extension on 04/02/18    PT Start Time  0930    PT Stop Time  1018    PT Time Calculation (min)  48 min    Activity Tolerance  Patient tolerated treatment well    Behavior During Therapy  [facility] for tasks assessed/performed       Past Medical History:  Diagnosis Date  . Arm pain    left  . Degenerative disc disease   . Diabetes mellitus   . Fibromyalgia   . Hyperlipidemia   . Hypertension   . IBS (irritable bowel syndrome)   . Osteoporosis     Past Surgical History:  Procedure Laterality Date  . ABDOMINAL HYSTERECTOMY    . ABDOMINAL SURGERY    . APPENDECTOMY    . CHOLECYSTECTOMY    . CYST EXCISION    . OVARY SURGERY      There were no vitals filed for this visit.  Subjective Assessment - 04/02/18 0937    Subjective  Pt arriving to therapy reporting back spasms and she had trouble getting out of the car. Pt reporting not taking an extra pain pill this morning. Pt reporting 2/10 in shoulder.     Limitations  Lifting;House hold activities    Diagnostic tests  MRI labral tear.     Patient Stated Goals  Return to washing hair and doing home tasks as before, dress independently.     Currently in Pain?  Yes    Pain Score  2     Pain Location  Shoulder    Pain Orientation  Left    Pain Descriptors / Indicators  Aching;Tightness    Pain Onset  More than a month ago    Pain Frequency  Constant    Aggravating Factors   tightness in the morning    Pain  Relieving Factors  meds, heat, stretching, movement         [facility] PT Assessment - 04/02/18 0001      Assessment   Medical Diagnosis  L RTC syndrom    Referring Provider  Faith Rogue MD    Next MD Visit  03/09/18    Prior Therapy  no      Restrictions   Weight Bearing Restrictions  No      Balance Screen   Has the patient fallen in the past 6 months  No      AROM   Left Shoulder Extension  50 Degrees    Left Shoulder Flexion  145 Degrees    Left Shoulder ABduction  115 Degrees                             PT Short Term Goals - 04/02/18 0947      PT SHORT TERM GOAL #1   Title  She will be independent with initial HEP    Time  3    Period  Weeks  Status  On-going      PT SHORT TERM GOAL #2   Title  Active  Lt shoulder flexion to 130 degrees to work toward independent self care    Baseline  132    Time  3    Period  Weeks    Status  Achieved      PT SHORT TERM GOAL #3   Time  3    Period  Weeks    Status  Achieved        PT Long Term Goals - 04/02/18 4098      PT LONG TERM GOAL #1   Title  She will be indpendent with all HEP issued    Baseline  independent so far with HEP    Time  6    Period  Weeks    Status  On-going      PT LONG TERM GOAL #2   Title  She will report pain decr 50% or more generally at rest    Baseline  it depends on the day, pt did report she has decreased her pain meds to one pill    Time  6    Period  Weeks    Status  On-going      PT LONG TERM GOAL #3   Title  She will be able to wash hair without assistance     Baseline  able    Time  6    Period  Weeks    Status  Achieved      PT LONG TERM GOAL #4   Title  She will report periods of no pain..     Baseline  pain is constant    Time  6    Period  Weeks    Status  Unable to assess      PT LONG TERM GOAL #5   Title  She will be able to dress donning shirt with minimal pain    Baseline  able    Period  Weeks    Status  Achieved      PT LONG  TERM GOAL #6   Title  FOTO score improved to 50% or more to demo improvement    Time  6    Period  Weeks    Status  Achieved   37% limitation on 04/02/18           Plan - 04/02/18 1024    Clinical Impression Statement  Pt tolerating all her exercises well, Pt  has made excellent progress with therapy still limited by pain at times. Progressing with resistance and weights. I'm requesting 4 additional visits on top of the 4 the pt has not been able to attend this certification period which is 8 more visit total. Pt will continue to progress toward improved AROM and decreased pain with ADL's.     Rehab Potential  Good    Clinical Impairments Affecting Rehab Potential  chronic pain , labral tear    PT Frequency  2x / week   8 additional visits   PT Duration  8 weeks    PT Treatment/Interventions  Dry needling;Patient/family education;Therapeutic exercise;Manual techniques;Taping;Moist Heat;Therapeutic activities    PT Next Visit Plan  ERO next visit.  Continue strengthening with red band.  Needs more Abduction.    PT Home Exercise Plan  Scapula retraction, neck side bend stretch,  single arm doorway stretch and sitting ER stretch with arm on table, isometrics,  shoulder bands,  red.  Consulted and Agree with Plan of Care  Patient       Patient will benefit from skilled therapeutic intervention in order to improve the following deficits and impairments:  Pain, Decreased activity tolerance, Decreased range of motion, Postural dysfunction, Increased muscle spasms, Impaired UE functional use  Visit Diagnosis: Chronic left shoulder pain - Plan: PT plan of care cert/re-cert  Stiffness of left shoulder, not elsewhere classified - Plan: PT plan of care cert/re-cert     Problem List Patient Active Problem List   Diagnosis Date Noted  . Adhesive capsulitis of left shoulder 03/09/2018  . Chronic pain of both shoulders 01/05/2018  . Chronic pain syndrome 01/05/2018  . CRPS (complex  regional pain syndrome), type I, upper 10/27/2013  . Cervical disc disorder with radiculopathy of cervical region 10/27/2013  . Lumbosacral spondylosis without myelopathy 12/13/2011  . Muscle pain, fibromyalgia 12/13/2011  . Bilateral sacroiliitis (HCC) 12/13/2011  . Thoracic or lumbosacral neuritis or radiculitis, unspecified 12/13/2011    Sharmon LeydenJennifer R Mckayla Mulcahey 04/02/2018, 10:35 AM  [facility] [address] [place], [place], [address] Phone: [phone]   Fax:  [phone]  Name: Shannon EaringSally Pundt MRN: [id number] Date of Birth: 06/21/1966

## 2018-04-08 ENCOUNTER — Encounter: Payer: Self-pay | Admitting: Physical Therapy

## 2018-04-08 ENCOUNTER — Ambulatory Visit: Payer: Medicare Other | Attending: Physical Medicine & Rehabilitation | Admitting: Physical Therapy

## 2018-04-08 DIAGNOSIS — M25512 Pain in left shoulder: Secondary | ICD-10-CM | POA: Diagnosis present

## 2018-04-08 DIAGNOSIS — G8929 Other chronic pain: Secondary | ICD-10-CM | POA: Diagnosis present

## 2018-04-08 DIAGNOSIS — M25612 Stiffness of left shoulder, not elsewhere classified: Secondary | ICD-10-CM

## 2018-04-08 NOTE — Therapy (Signed)
[place], [place], [address] Phone: [phone]   Fax:  [phone]  Physical Therapy Treatment  Patient Details  Name: Shannon French MRN: [id number] Date of Birth: [birth year] Referring Provider: Alger Simons MD   Encounter Date: 04/08/2018  PT End of Session - 04/08/18 1041    Visit Number  9    Number of Visits  16    PT Start Time  0941    PT Stop Time  1020    PT Time Calculation (min)  39 min    Activity Tolerance  Patient tolerated treatment well    Behavior During Therapy  [facility] for tasks assessed/performed       Past Medical History:  Diagnosis Date  . Arm pain    left  . Degenerative disc disease   . Diabetes mellitus   . Fibromyalgia   . Hyperlipidemia   . Hypertension   . IBS (irritable bowel syndrome)   . Osteoporosis     Past Surgical History:  Procedure Laterality Date  . ABDOMINAL HYSTERECTOMY    . ABDOMINAL SURGERY    . APPENDECTOMY    . CHOLECYSTECTOMY    . CYST EXCISION    . OVARY SURGERY      There were no vitals filed for this visit.  Subjective Assessment - 04/08/18 0948    Subjective  Less pain meds now. I woke up with sleeping on shoulder LEFT.  I am ready to move up to the green band.     Currently in Pain?  Yes    Pain Score  1     Pain Location  Shoulder    Pain Orientation  Left    Pain Descriptors / Indicators  Sore    Pain Type  Chronic pain    Pain Frequency  Intermittent    Aggravating Factors   moving arm a certain way.    Pain Relieving Factors  doing the exercises every day.         [facility] PT Assessment - 04/08/18 0001      AROM   Left Shoulder Flexion  145 Degrees    Left Shoulder ABduction  --   WNL                  [facility] Adult PT Treatment/Exercise - 04/08/18 0001      Shoulder Exercises: Prone   Flexion  10 reps    Flexion Weight (lbs)  0    Flexion Limitations  cued,  left over the edge of the mat    Extension  10  reps    Extension Weight (lbs)  0    Extension Limitations  cued    Horizontal ABduction 1  10 reps    Horizontal ABduction 1 Weight (lbs)  0    Horizontal ABduction 1 Limitations  thumb forward      Shoulder Exercises: Standing   External Rotation  Strengthening    Theraband Level (Shoulder External Rotation)  Level 3 (Green)    Internal Rotation  Left;10 reps    Theraband Level (Shoulder Internal Rotation)  Level 3 (Green)    Flexion  10 reps    Theraband Level (Shoulder Flexion)  Level 3 (Green)    ABduction  10 reps    Theraband Level (Shoulder ABduction)  Level 3 (Green)    Extension  Left;10 reps    Theraband Level (Shoulder Extension)  Level 3 (Green)    Row  10 reps  Theraband Level (Shoulder Row)  Level 3 (Green)    Other Standing Exercises  20  wall push up      Shoulder Exercises: Pulleys   Flexion  3 minutes    Scaption  3 minutes   abduction     Shoulder Exercises: ROM/Strengthening   UBE (Upper Arm Bike)  6 minutes L1.5 reversing each minute      Shoulder Exercises: Stretch   Other Shoulder Stretches  prone over a pillow  stretch arms hanging on edge   3 X 30   Other Shoulder Stretches  shoulder ER stretch single arm doorway             PT Education - 04/08/18 1041    Education Details  exercise form    Person(s) Educated  Patient    Methods  Explanation;Demonstration;Verbal cues    Comprehension  Returned demonstration;Verbalized understanding       PT Short Term Goals - 04/08/18 1045      PT SHORT TERM GOAL #1   Title  She will be independent with initial HEP    Baseline  independent    Time  3    Period  Weeks    Status  Achieved      PT SHORT TERM GOAL #2   Title  Active  Lt shoulder flexion to 130 degrees to work toward independent self care    Time  3    Period  Weeks    Status  Achieved      PT SHORT TERM GOAL #3   Title  She will report pain decr 20% or more with general activity    Time  3    Period  Weeks    Status   Achieved        PT Long Term Goals - 04/08/18 1046      PT LONG TERM GOAL #1   Title  She will be indpendent with all HEP issued    Baseline  independent so far with HEP    Time  6    Period  Weeks    Status  On-going      PT LONG TERM GOAL #2   Title  She will report pain decr 50% or more generally at rest    Baseline  % not assessed.  Pain now intermittant    Time  6    Period  Weeks    Status  On-going      PT LONG TERM GOAL #3   Title  She will be able to wash hair without assistance     Baseline  able    Time  6    Period  Weeks    Status  Achieved      PT LONG TERM GOAL #4   Title  She will report periods of no pain..     Baseline  now intermittant in shoulder,  consistant?    Time  6    Period  Weeks    Status  Partially Met      PT LONG TERM GOAL #5   Title  She will be able to dress donning shirt with minimal pain    Baseline  able    Time  6    Status  Achieved      PT LONG TERM GOAL #6   Title  FOTO score improved to 50% or more to demo improvement    Time  6    Period  Weeks  Status  Achieved            Plan - 04/08/18 1042    Clinical Impression Statement  Patient continues to make gains with pain and ROM  ER is now WNL. STG#1 met and LTG#4 partially met.  She has been tolerating exercise progression over the last several weeks.  No pain at end of session,  She felt the "Burn "  with strengthening.  She can touch right scapula with left hand actively.     PT Next Visit Plan  See how return to gym with light weights goes.  continue strengthening/stretch  patient may be ready for transistion the gym soon.    PT Home Exercise Plan  Scapula retraction, neck side bend stretch,  single arm doorway stretch and sitting ER stretch with arm on table, isometrics,  shoulder bands,  green    Consulted and Agree with Plan of Care  Patient       Patient will benefit from skilled therapeutic intervention in order to improve the following deficits and  impairments:     Visit Diagnosis: Chronic left shoulder pain  Stiffness of left shoulder, not elsewhere classified     Problem List Patient Active Problem List   Diagnosis Date Noted  . Adhesive capsulitis of left shoulder 03/09/2018  . Chronic pain of both shoulders 01/05/2018  . Chronic pain syndrome 01/05/2018  . CRPS (complex regional pain syndrome), type I, upper 10/27/2013  . Cervical disc disorder with radiculopathy of cervical region 10/27/2013  . Lumbosacral spondylosis without myelopathy 12/13/2011  . Muscle pain, fibromyalgia 12/13/2011  . Bilateral sacroiliitis ([place]) 12/13/2011  . Thoracic or lumbosacral neuritis or radiculitis, unspecified 12/13/2011    HARRIS,KAREN PTA 04/08/2018, 10:49 AM  [facility] [place], [place], [address] Phone: [phone]   Fax:  [phone]  Name: Shannon French MRN: [id number] Date of Birth: 04-30-1966

## 2018-04-11 ENCOUNTER — Other Ambulatory Visit: Payer: Self-pay | Admitting: Physical Medicine & Rehabilitation

## 2018-04-11 DIAGNOSIS — Z5181 Encounter for therapeutic drug level monitoring: Secondary | ICD-10-CM

## 2018-04-11 DIAGNOSIS — M25512 Pain in left shoulder: Principal | ICD-10-CM

## 2018-04-11 DIAGNOSIS — G894 Chronic pain syndrome: Secondary | ICD-10-CM

## 2018-04-11 DIAGNOSIS — G90512 Complex regional pain syndrome I of left upper limb: Secondary | ICD-10-CM

## 2018-04-11 DIAGNOSIS — G8929 Other chronic pain: Secondary | ICD-10-CM

## 2018-04-11 DIAGNOSIS — M542 Cervicalgia: Secondary | ICD-10-CM

## 2018-04-11 DIAGNOSIS — M47817 Spondylosis without myelopathy or radiculopathy, lumbosacral region: Secondary | ICD-10-CM

## 2018-04-11 DIAGNOSIS — Z79899 Other long term (current) drug therapy: Secondary | ICD-10-CM

## 2018-04-13 ENCOUNTER — Ambulatory Visit: Payer: Medicare Other

## 2018-04-14 ENCOUNTER — Encounter: Payer: Self-pay | Admitting: Physical Therapy

## 2018-04-14 ENCOUNTER — Ambulatory Visit: Payer: Medicare Other | Admitting: Physical Therapy

## 2018-04-14 DIAGNOSIS — M25512 Pain in left shoulder: Secondary | ICD-10-CM | POA: Diagnosis not present

## 2018-04-14 DIAGNOSIS — M25612 Stiffness of left shoulder, not elsewhere classified: Secondary | ICD-10-CM

## 2018-04-14 DIAGNOSIS — G8929 Other chronic pain: Secondary | ICD-10-CM

## 2018-04-14 NOTE — Therapy (Signed)
[facility] [address] [place], [place], [address] Phone: [phone]   Fax:  [phone]  Physical Therapy Treatment  Patient Details  Name: Shannon French MRN: [id number] Date of Birth: 06/14/1966 Referring Provider: Faith Rogue MD   Encounter Date: 04/14/2018  PT End of Session - 04/14/18 1211    Visit Number  10    Number of Visits  16    Date for PT Re-Evaluation  06/02/18    PT Start Time  1215    PT Stop Time  1300    PT Time Calculation (min)  45 min    Activity Tolerance  Patient tolerated treatment well    Behavior During Therapy  [facility] for tasks assessed/performed       Past Medical History:  Diagnosis Date  . Arm pain    left  . Degenerative disc disease   . Diabetes mellitus   . Fibromyalgia   . Hyperlipidemia   . Hypertension   . IBS (irritable bowel syndrome)   . Osteoporosis     Past Surgical History:  Procedure Laterality Date  . ABDOMINAL HYSTERECTOMY    . ABDOMINAL SURGERY    . APPENDECTOMY    . CHOLECYSTECTOMY    . CYST EXCISION    . OVARY SURGERY      There were no vitals filed for this visit.  Subjective Assessment - 04/14/18 1216    Subjective  Pt arriving to therapy reporting 1/10 pain in left shoulder and reported being released by her MD. Pt reported tolerating the green band at home.     Limitations  Lifting;House hold activities    Diagnostic tests  MRI labral tear.     Patient Stated Goals  Return to washing hair and doing home tasks as before, dress independently.     Currently in Pain?  Yes    Pain Score  1     Pain Location  Shoulder    Pain Orientation  Left    Pain Descriptors / Indicators  Discomfort    Pain Type  Chronic pain    Pain Onset  More than a month ago    Aggravating Factors   moving arm certain ways will cause "popping"    Pain Relieving Factors  doing my exercises                       [facility] Adult PT Treatment/Exercise - 04/14/18 0001       Shoulder Exercises: Prone   Flexion  10 reps    Flexion Limitations  cued,  left over the edge of the mat    Extension  10 reps    Horizontal ABduction 1  10 reps    Horizontal ABduction 1 Limitations  thumb forward      Shoulder Exercises: Standing   External Rotation  Strengthening    Theraband Level (Shoulder External Rotation)  Level 3 (Green)    Internal Rotation  Left;10 reps    Theraband Level (Shoulder Internal Rotation)  Level 4 (Blue)    Flexion  10 reps    Theraband Level (Shoulder Flexion)  Level 3 (Green)    ABduction  10 reps    Theraband Level (Shoulder ABduction)  Level 3 (Green)    Extension  Left;10 reps    Theraband Level (Shoulder Extension)  Level 4 (Blue)    Row  10 reps    Theraband Level (Shoulder Row)  Level 3 (Green)    Other Standing  Exercises  ball walks with 5# ball, wood chops x 10 with red theraband      Shoulder Exercises: Pulleys   Flexion  3 minutes    Scaption  3 minutes   abduction     Shoulder Exercises: ROM/Strengthening   UBE (Upper Arm Bike)  6 minutes L1.5 reversing each minute      Shoulder Exercises: Stretch   Other Shoulder Stretches  shoulder ER stretch single arm doorway             PT Education - 04/14/18 1221    Education Details  exercise plan for moving to blue theraband    Person(s) Educated  Patient    Methods  Explanation;Demonstration    Comprehension  Verbalized understanding;Returned demonstration       PT Short Term Goals - 04/14/18 1212      PT SHORT TERM GOAL #1   Title  She will be independent with initial HEP    Baseline  independent    Status  Achieved      PT SHORT TERM GOAL #2   Title  Active  Lt shoulder flexion to 130 degrees to work toward independent self care    Time  3    Period  Weeks    Status  Achieved      PT SHORT TERM GOAL #3   Title  She will report pain decr 20% or more with general activity    Status  Achieved        PT Long Term Goals - 04/14/18 1212      PT  LONG TERM GOAL #1   Title  She will be indpendent with all HEP issued    Baseline  independent so far with HEP    Period  Weeks    Status  On-going      PT LONG TERM GOAL #2   Title  She will report pain decr 50% or more generally at rest    Baseline  % not assessed.  Pain now intermittant    Time  6    Period  Weeks    Status  On-going      PT LONG TERM GOAL #3   Title  She will be able to wash hair without assistance     Baseline  able    Time  6    Period  Weeks      PT LONG TERM GOAL #4   Title  She will report periods of no pain..     Baseline  now intermittant in shoulder,  consistant?    Time  6    Period  Weeks      PT LONG TERM GOAL #5   Title  She will be able to dress donning shirt with minimal pain    Baseline  able    Period  Weeks    Status  Achieved      PT LONG TERM GOAL #6   Title  FOTO score improved to 50% or more to demo improvement    Period  Weeks    Status  Achieved            Plan - 04/14/18 1214    Clinical Impression Statement  Pt continues to make progress with ROM and strength. Pt reporting less pain in general but still reporting flare ups and intermittent pain with certain activities. Pt instructed in prone exercises using a therapy ball. Discussed using the cable cross her next visit and progresing toward  blue theraband exercises.  Continue toward pt's LTG's and improved functional mobility.     Clinical Presentation  Stable    Rehab Potential  Good    Clinical Impairments Affecting Rehab Potential  chronic pain , labral tear    PT Frequency  2x / week    PT Duration  8 weeks    PT Treatment/Interventions  Dry needling;Patient/family education;Therapeutic exercise;Manual techniques;Taping;Moist Heat;Therapeutic activities    PT Next Visit Plan  See how return to gym with light weights goes.  continue strengthening/stretch  patient may be ready for transistion the gym soon. Discussed dropping visits to once a week for 2-3 times and  then discharge.     PT Home Exercise Plan  Scapula retraction, neck side bend stretch,  single arm doorway stretch and sitting ER stretch with arm on table, isometrics,  shoulder bands,  green    Consulted and Agree with Plan of Care  Patient       Patient will benefit from skilled therapeutic intervention in order to improve the following deficits and impairments:  Pain, Decreased activity tolerance, Decreased range of motion, Postural dysfunction, Increased muscle spasms, Impaired UE functional use  Visit Diagnosis: Chronic left shoulder pain  Stiffness of left shoulder, not elsewhere classified     Problem List Patient Active Problem List   Diagnosis Date Noted  . Adhesive capsulitis of left shoulder 03/09/2018  . Chronic pain of both shoulders 01/05/2018  . Chronic pain syndrome 01/05/2018  . CRPS (complex regional pain syndrome), type I, upper 10/27/2013  . Cervical disc disorder with radiculopathy of cervical region 10/27/2013  . Lumbosacral spondylosis without myelopathy 12/13/2011  . Muscle pain, fibromyalgia 12/13/2011  . Bilateral sacroiliitis (HCC) 12/13/2011  . Thoracic or lumbosacral neuritis or radiculitis, unspecified 12/13/2011    Sharmon Leyden , MPT 04/14/2018, 1:12 PM  [facility] [address] [place], [place], [address] Phone: [phone]   Fax:  [phone]  Name: Shannon French MRN: [id number] Date of Birth: 11/17/1965

## 2018-04-15 ENCOUNTER — Telehealth: Payer: Self-pay | Admitting: *Deleted

## 2018-04-15 ENCOUNTER — Ambulatory Visit: Payer: Medicare Other

## 2018-04-15 DIAGNOSIS — G8929 Other chronic pain: Secondary | ICD-10-CM

## 2018-04-15 DIAGNOSIS — G894 Chronic pain syndrome: Secondary | ICD-10-CM

## 2018-04-15 DIAGNOSIS — Z79899 Other long term (current) drug therapy: Secondary | ICD-10-CM

## 2018-04-15 DIAGNOSIS — M25512 Pain in left shoulder: Secondary | ICD-10-CM | POA: Diagnosis not present

## 2018-04-15 DIAGNOSIS — Z5181 Encounter for therapeutic drug level monitoring: Secondary | ICD-10-CM

## 2018-04-15 DIAGNOSIS — M47817 Spondylosis without myelopathy or radiculopathy, lumbosacral region: Secondary | ICD-10-CM

## 2018-04-15 DIAGNOSIS — M25612 Stiffness of left shoulder, not elsewhere classified: Secondary | ICD-10-CM

## 2018-04-15 MED ORDER — HYDROCODONE-ACETAMINOPHEN 7.5-325 MG PO TABS: 1.0000 | ORAL_TABLET | Freq: Four times a day (QID) | ORAL | 0 refills | Status: DC | PRN

## 2018-04-15 NOTE — Telephone Encounter (Signed)
rx written

## 2018-04-15 NOTE — Therapy (Signed)
[facility] [address] [place], [place], [address] Phone: [phone]   Fax:  [phone]  Physical Therapy Treatment  Patient Details  Name: Shannon French MRN: [id number] Date of Birth: 06-Mar-1966 Referring Provider: Faith Rogue MD   Encounter Date: 04/15/2018  PT End of Session - 04/15/18 0800    Visit Number  11    Number of Visits  16    Date for PT Re-Evaluation  06/02/18    Authorization Type  [employer] MCR, applied for 8 additional visits and date extension on 04/02/18    PT Start Time  0800    PT Stop Time  0845    PT Time Calculation (min)  45 min    Activity Tolerance  Patient tolerated treatment well    Behavior During Therapy  [facility] for tasks assessed/performed       Past Medical History:  Diagnosis Date  . Arm pain    left  . Degenerative disc disease   . Diabetes mellitus   . Fibromyalgia   . Hyperlipidemia   . Hypertension   . IBS (irritable bowel syndrome)   . Osteoporosis     Past Surgical History:  Procedure Laterality Date  . ABDOMINAL HYSTERECTOMY    . ABDOMINAL SURGERY    . APPENDECTOMY    . CHOLECYSTECTOMY    . CYST EXCISION    . OVARY SURGERY      There were no vitals filed for this visit.  Subjective Assessment - 04/15/18 0803    Subjective  No pain in shoulder. went to gym yesterday.  Wants  to use thicker band    Currently in Pain?  No/denies                       [facility] Adult PT Treatment/Exercise - 04/15/18 0001      Shoulder Exercises: Prone   Flexion  12 reps;Left    Flexion Weight (lbs)  2    Flexion Limitations  cued to engge scapula    Extension  Left;12 reps    Extension Weight (lbs)  2    Horizontal ABduction 1  Left;12 reps    Horizontal ABduction 1 Weight (lbs)  2      Shoulder Exercises: Standing   External Rotation  Left;12 reps    Theraband Level (Shoulder External Rotation)  Level 4 (Blue)    Internal Rotation  Left;10 reps    Theraband Level  (Shoulder Internal Rotation)  Level 4 (Blue)    Flexion  10 reps    Theraband Level (Shoulder Flexion)  Level 4 (Blue)    ABduction  10 reps    Theraband Level (Shoulder ABduction)  Level 4 (Blue)    Extension  Left;10 reps    Theraband Level (Shoulder Extension)  Level 4 (Blue)    Row  10 reps    Theraband Level (Shoulder Row)  Level 4 (Blue)    Other Standing Exercises  Blue diagonals x 20 single Lt arm      Shoulder Exercises: Pulleys   Flexion  3 minutes      Shoulder Exercises: ROM/Strengthening   UBE (Upper Arm Bike)  6 minutes L 2 reversing each minute      Shoulder Exercises: Stretch   Other Shoulder Stretches  shoulder ER stretch single arm doorway  and multi height corner stretch boyth arms             PT Education - 04/15/18 0840  Education Details  Added blue band to HEp and cautioned her to nor overdo reps or tension    Person(s) Educated  Patient    Methods  Explanation    Comprehension  Verbalized understanding       PT Short Term Goals - 04/14/18 1212      PT SHORT TERM GOAL #1   Title  She will be independent with initial HEP    Baseline  independent    Status  Achieved      PT SHORT TERM GOAL #2   Title  Active  Lt shoulder flexion to 130 degrees to work toward independent self care    Time  3    Period  Weeks    Status  Achieved      PT SHORT TERM GOAL #3   Title  She will report pain decr 20% or more with general activity    Status  Achieved        PT Long Term Goals - 04/14/18 1212      PT LONG TERM GOAL #1   Title  She will be indpendent with all HEP issued    Baseline  independent so far with HEP    Period  Weeks    Status  On-going      PT LONG TERM GOAL #2   Title  She will report pain decr 50% or more generally at rest    Baseline  % not assessed.  Pain now intermittant    Time  6    Period  Weeks    Status  On-going      PT LONG TERM GOAL #3   Title  She will be able to wash hair without assistance     Baseline   able    Time  6    Period  Weeks      PT LONG TERM GOAL #4   Title  She will report periods of no pain..     Baseline  now intermittant in shoulder,  consistant?    Time  6    Period  Weeks      PT LONG TERM GOAL #5   Title  She will be able to dress donning shirt with minimal pain    Baseline  able    Period  Weeks    Status  Achieved      PT LONG TERM GOAL #6   Title  FOTO score improved to 50% or more to demo improvement    Period  Weeks    Status  Achieved            Plan - 04/15/18 0801    Clinical Impression Statement  Pain levels are significantly improved and ROm improved .  Need to progress HEP and probable discharge end POC    PT Treatment/Interventions  Dry needling;Patient/family education;Therapeutic exercise;Manual techniques;Taping;Moist Heat;Therapeutic activities    PT Next Visit Plan  See how return to gym with light weights goes.  continue strengthening/stretch  patient may be ready for transistion the gym soon. Discussed dropping visits to once a week for 2-3 times and then discharge.     PT Home Exercise Plan  Scapula retraction, neck side bend stretch,  single arm doorway stretch and sitting ER stretch with arm on table, isometrics,  shoulder bands,  green    Consulted and Agree with Plan of Care  Patient       Patient will benefit from skilled therapeutic intervention in order to improve  the following deficits and impairments:  Pain, Decreased activity tolerance, Decreased range of motion, Postural dysfunction, Increased muscle spasms, Impaired UE functional use  Visit Diagnosis: Stiffness of left shoulder, not elsewhere classified  Chronic left shoulder pain     Problem List Patient Active Problem List   Diagnosis Date Noted  . Adhesive capsulitis of left shoulder 03/09/2018  . Chronic pain of both shoulders 01/05/2018  . Chronic pain syndrome 01/05/2018  . CRPS (complex regional pain syndrome), type I, upper 10/27/2013  . Cervical disc  disorder with radiculopathy of cervical region 10/27/2013  . Lumbosacral spondylosis without myelopathy 12/13/2011  . Muscle pain, fibromyalgia 12/13/2011  . Bilateral sacroiliitis ([facility]) 12/13/2011  . Thoracic or lumbosacral neuritis or radiculitis, unspecified 12/13/2011    Caprice Red  PT 04/15/2018, 8:42 AM  [facility] [address] [place], [place], [address] Phone: [phone]   Fax:  [phone]  Name: Zooey Schreurs MRN: [id number] Date of Birth: [birth year]

## 2018-04-15 NOTE — Telephone Encounter (Signed)
Shannon French is requesting her refill on her hydrocodone.  It was last filled 03/21/18 and her appt with Riley Kill is 05/11/18.

## 2018-04-22 ENCOUNTER — Ambulatory Visit: Payer: Medicare Other | Admitting: Physical Therapy

## 2018-04-22 ENCOUNTER — Encounter: Payer: Self-pay | Admitting: Physical Therapy

## 2018-04-22 DIAGNOSIS — G8929 Other chronic pain: Secondary | ICD-10-CM

## 2018-04-22 DIAGNOSIS — M25612 Stiffness of left shoulder, not elsewhere classified: Secondary | ICD-10-CM

## 2018-04-22 DIAGNOSIS — M25512 Pain in left shoulder: Secondary | ICD-10-CM

## 2018-04-22 NOTE — Therapy (Signed)
[place], [place], [address] Phone: [phone]   Fax:  [phone]  Physical Therapy Treatment  Patient Details  Name: Shannon French MRN: [id number] Date of Birth: Mar 26, 1966 Referring Provider: Alger Simons MD   Encounter Date: 04/22/2018  PT End of Session - 04/22/18 0847    Visit Number  12    Number of Visits  16    Date for PT Re-Evaluation  06/02/18    PT Start Time  0803    PT Stop Time  0905    PT Time Calculation (min)  62 min    Activity Tolerance  Patient tolerated treatment well    Behavior During Therapy  [facility] for tasks assessed/performed       Past Medical History:  Diagnosis Date  . Arm pain    left  . Degenerative disc disease   . Diabetes mellitus   . Fibromyalgia   . Hyperlipidemia   . Hypertension   . IBS (irritable bowel syndrome)   . Osteoporosis     Past Surgical History:  Procedure Laterality Date  . ABDOMINAL HYSTERECTOMY    . ABDOMINAL SURGERY    . APPENDECTOMY    . CHOLECYSTECTOMY    . CYST EXCISION    . OVARY SURGERY      There were no vitals filed for this visit.  Subjective Assessment - 04/22/18 0808    Subjective  I had to do the shoulder exercises sitting due to back flare pain spasms.  I was in the gym an hour.    Currently in Pain?  No/denies    Pain Location  Shoulder    Pain Orientation  Left    Multiple Pain Sites  --   back pain , mid,  flared from start up at gym I was in bed all day Sat,                        [facility] Adult PT Treatment/Exercise - 04/22/18 0001      Shoulder Exercises: Prone   Flexion  10 reps    Flexion Weight (lbs)  3    Extension  10 reps    Extension Weight (lbs)  3    Horizontal ABduction 1  Both    Horizontal ABduction 1 Weight (lbs)  3    Other Prone Exercises  row 3 LBS both .   All prone exercise       Shoulder Exercises: Standing   Horizontal ABduction Limitations  standing back to wall  sweeping arms up to overhead attempting to keep in contact with the wall.    5 x  (Pended)     External Rotation  Left;12 reps   and right   Theraband Level (Shoulder External Rotation)  Level 4 (Blue)    Internal Rotation  Left;10 reps   and right   Theraband Level (Shoulder Internal Rotation)  Level 4 (Blue)    Flexion  10 reps   Left and right   Theraband Level (Shoulder Flexion)  Level 4 (Blue)    ABduction  10 reps   Both   Theraband Level (Shoulder ABduction)  Level 4 (Blue)    Extension  Left;10 reps   BOTH   Theraband Level (Shoulder Extension)  Level 4 (Blue)    Row  10 reps   Both   Theraband Level (Shoulder Row)  Level 4 (Blue)    Other Standing Exercises  Blue diagonals x 20  single Lt arm   both   Other Standing Exercises  Ball on wall rolling up/ down,  medicine ball blue 10 X      Shoulder Exercises: Pulleys   Flexion  3 minutes    Scaption  3 minutes   30 X  each     Shoulder Exercises: ROM/Strengthening   UBE (Upper Arm Bike)  6 minutes L 2 reversing each minute      Shoulder Exercises: Stretch   Other Shoulder Stretches  shoulder ER stretch single arm doorway  and multi height corner stretch boyth arms      Moist Heat Therapy   Number Minutes Moist Heat  10 Minutes    Moist Heat Location  Shoulder             PT Education - 04/22/18 0846    Education Details  exercise form    Person(s) Educated  Patient    Methods  Explanation    Comprehension  Verbalized understanding       PT Short Term Goals - 04/14/18 1212      PT SHORT TERM GOAL #1   Title  She will be independent with initial HEP    Baseline  independent    Status  Achieved      PT SHORT TERM GOAL #2   Title  Active  Lt shoulder flexion to 130 degrees to work toward independent self care    Time  3    Period  Weeks    Status  Achieved      PT SHORT TERM GOAL #3   Title  She will report pain decr 20% or more with general activity    Status  Achieved        PT Long Term  Goals - 04/22/18 1329      PT LONG TERM GOAL #1   Title  She will be indpendent with all HEP issued    Baseline  independent so far with HEP    Time  6    Period  Weeks    Status  On-going      PT LONG TERM GOAL #2   Title  She will report pain decr 50% or more generally at rest    Baseline  No pain today , % not assessed.     Time  6    Period  Weeks    Status  On-going      PT LONG TERM GOAL #3   Title  She will be able to wash hair without assistance     Baseline  able    Time  6    Period  Weeks    Status  Achieved      PT LONG TERM GOAL #4   Title  She will report periods of no pain..     Baseline  consistant with periods of no pain    Time  6    Period  Weeks    Status  Achieved      PT LONG TERM GOAL #5   Title  She will be able to dress donning shirt with minimal pain    Baseline  able    Time  6    Period  Weeks    Status  Achieved      PT LONG TERM GOAL #6   Title  FOTO score improved to 50% or more to demo improvement    Time  6    Period  Weeks  Status  Achieved            Plan - 04/22/18 0847    Clinical Impression Statement  No shoulder pain at end of session. Patient was able to progress to 3 LBS for strengthening. 5/10 back pain.  LTG#4 met.    PT Next Visit Plan  See how return to gym with light weights goes.  continue strengthening/stretch  patient may be ready for transistion the gym soon. Discussed dropping visits to once a week for 2-3 times and then discharge.     PT Home Exercise Plan  Scapula retraction, neck side bend stretch,  single arm doorway stretch and sitting ER stretch with arm on table, isometrics,  shoulder bands,  green    Consulted and Agree with Plan of Care  Patient       Patient will benefit from skilled therapeutic intervention in order to improve the following deficits and impairments:     Visit Diagnosis: Stiffness of left shoulder, not elsewhere classified  Chronic left shoulder pain     Problem  List Patient Active Problem List   Diagnosis Date Noted  . Adhesive capsulitis of left shoulder 03/09/2018  . Chronic pain of both shoulders 01/05/2018  . Chronic pain syndrome 01/05/2018  . CRPS (complex regional pain syndrome), type I, upper 10/27/2013  . Cervical disc disorder with radiculopathy of cervical region 10/27/2013  . Lumbosacral spondylosis without myelopathy 12/13/2011  . Muscle pain, fibromyalgia 12/13/2011  . Bilateral sacroiliitis ([place]) 12/13/2011  . Thoracic or lumbosacral neuritis or radiculitis, unspecified 12/13/2011    Maddilynn Esperanza PTA 04/22/2018, 1:33 PM  [place], [place], [address] Phone: [phone]   Fax:  [phone]  Name: Brinley Rosete MRN: [id number] Date of Birth: [birth year]

## 2018-04-30 ENCOUNTER — Ambulatory Visit: Payer: Medicare Other

## 2018-04-30 DIAGNOSIS — M25612 Stiffness of left shoulder, not elsewhere classified: Secondary | ICD-10-CM

## 2018-04-30 DIAGNOSIS — M25512 Pain in left shoulder: Secondary | ICD-10-CM

## 2018-04-30 DIAGNOSIS — G8929 Other chronic pain: Secondary | ICD-10-CM

## 2018-04-30 NOTE — Therapy (Addendum)
[place], [place], [address] Phone: [phone]   Fax:  [phone]  Physical Therapy Treatment/Discharge  Patient Details  Name: Avacyn Kloosterman MRN: [id number] Date of Birth: 1965-11-07 Referring Provider: Alger Simons MD   Encounter Date: 04/30/2018  PT End of Session - 04/30/18 1057    Visit Number  13    Number of Visits  16    Date for PT Re-Evaluation  06/02/18    Authorization Type  [employer] MCR, applied for 8 additional visits and date extension on 04/02/18    PT Start Time  1055    PT Stop Time  1135    PT Time Calculation (min)  40 min    Activity Tolerance  Treatment limited secondary to medical complications (Comment)    Behavior During Therapy  [facility] for tasks assessed/performed       Past Medical History:  Diagnosis Date  . Arm pain    left  . Degenerative disc disease   . Diabetes mellitus   . Fibromyalgia   . Hyperlipidemia   . Hypertension   . IBS (irritable bowel syndrome)   . Osteoporosis     Past Surgical History:  Procedure Laterality Date  . ABDOMINAL HYSTERECTOMY    . ABDOMINAL SURGERY    . APPENDECTOMY    . CHOLECYSTECTOMY    . CYST EXCISION    . OVARY SURGERY      There were no vitals filed for this visit.  Subjective Assessment - 04/30/18 1101    Subjective  Shoulder doing great. No weights at gym for arms.    No pain to start.    Currently in Pain?  No/denies                       [facility] Adult PT Treatment/Exercise - 04/30/18 0001      Shoulder Exercises: Pulleys   Flexion  3 minutes      Shoulder Exercises: ROM/Strengthening   UBE (Upper Arm Bike)  6 minutes L 2 reversing each minute      Shoulder Exercises: Stretch   Other Shoulder Stretches  Tai Chi for balance and UE ROM and control TC  for Arthritis      Tai Chi for arthritis first 6 forms [employer] program. And arm sweeps RT and LT  For chest opening and lower trap recruitment.           PT Short Term Goals - 04/14/18 1212      PT SHORT TERM GOAL #1   Title  She will be independent with initial HEP    Baseline  independent    Status  Achieved      PT SHORT TERM GOAL #2   Title  Active  Lt shoulder flexion to 130 degrees to work toward independent self care    Time  3    Period  Weeks    Status  Achieved      PT SHORT TERM GOAL #3   Title  She will report pain decr 20% or more with general activity    Status  Achieved        PT Long Term Goals - 04/22/18 1329      PT LONG TERM GOAL #1   Title  She will be indpendent with all HEP issued    Baseline  independent so far with HEP    Time  6    Period  Weeks  Status  On-going      PT LONG TERM GOAL #2   Title  She will report pain decr 50% or more generally at rest    Baseline  No pain today , % not assessed.     Time  6    Period  Weeks    Status  On-going      PT LONG TERM GOAL #3   Title  She will be able to wash hair without assistance     Baseline  able    Time  6    Period  Weeks    Status  Achieved      PT LONG TERM GOAL #4   Title  She will report periods of no pain..     Baseline  consistant with periods of no pain    Time  6    Period  Weeks    Status  Achieved      PT LONG TERM GOAL #5   Title  She will be able to dress donning shirt with minimal pain    Baseline  able    Time  6    Period  Weeks    Status  Achieved      PT LONG TERM GOAL #6   Title  FOTO score improved to 50% or more to demo improvement    Time  6    Period  Weeks    Status  Achieved            Plan - 04/30/18 1057    Clinical Impression Statement  Pt wanted some exercies related to [employer] for arm and balance. so taught her first  section of TAI Chi for arthritis from [employer] and added arm sweeps for opening chest and facilitate lower trap.     PT Treatment/Interventions  Dry needling;Patient/family education;Therapeutic exercise;Manual techniques;Taping;Moist Heat;Therapeutic  activities    PT Next Visit Plan  See how return to gym with light weights goes.  continue strengthening/stretch  patient may be ready for transistion the gym soon. Discussed dropping visits to once a week for 2-3 times and then discharge.     PT Home Exercise Plan  Scapula retraction, neck side bend stretch,  single arm doorway stretch and sitting ER stretch with arm on table, isometrics,  shoulder bands,  green    Consulted and Agree with Plan of Care  Patient       Patient will benefit from skilled therapeutic intervention in order to improve the following deficits and impairments:  Pain, Decreased activity tolerance, Decreased range of motion, Postural dysfunction, Increased muscle spasms, Impaired UE functional use  Visit Diagnosis: Stiffness of left shoulder, not elsewhere classified  Chronic left shoulder pain     Problem List Patient Active Problem List   Diagnosis Date Noted  . Adhesive capsulitis of left shoulder 03/09/2018  . Chronic pain of both shoulders 01/05/2018  . Chronic pain syndrome 01/05/2018  . CRPS (complex regional pain syndrome), type I, upper 10/27/2013  . Cervical disc disorder with radiculopathy of cervical region 10/27/2013  . Lumbosacral spondylosis without myelopathy 12/13/2011  . Muscle pain, fibromyalgia 12/13/2011  . Bilateral sacroiliitis ([place]) 12/13/2011  . Thoracic or lumbosacral neuritis or radiculitis, unspecified 12/13/2011    Darrel Hoover PT  04/30/2018, 12:04 PM  [facility] [address] [place], [place], [address] Phone: [phone]   Fax:  [phone]  Name: Bettina Warn MRN: [id number] Date of Birth: 1965/08/27  PHYSICAL THERAPY  DISCHARGE SUMMARY  Visits from Start of Care: 13  Current functional level related to goals / functional outcomes: See above She did not return after this visit. It has been 4 weeks so will need new order if PT needed.    Remaining deficits: See  above as of last visit   Education / Equipment: HEP  Plan:                                                    Patient goals were partially met. Patient is being discharged due to not returning since the last visit.  ?????  Pearson Forster PT 06/17/18

## 2018-05-07 ENCOUNTER — Ambulatory Visit: Payer: Medicare Other | Admitting: Physical Therapy

## 2018-05-11 ENCOUNTER — Encounter: Payer: Medicare Other | Attending: Physical Medicine & Rehabilitation | Admitting: Physical Medicine & Rehabilitation

## 2018-05-11 ENCOUNTER — Encounter: Payer: Self-pay | Admitting: Physical Medicine & Rehabilitation

## 2018-05-11 DIAGNOSIS — Z79899 Other long term (current) drug therapy: Secondary | ICD-10-CM

## 2018-05-11 DIAGNOSIS — K589 Irritable bowel syndrome without diarrhea: Secondary | ICD-10-CM | POA: Insufficient documentation

## 2018-05-11 DIAGNOSIS — Z9889 Other specified postprocedural states: Secondary | ICD-10-CM | POA: Insufficient documentation

## 2018-05-11 DIAGNOSIS — M810 Age-related osteoporosis without current pathological fracture: Secondary | ICD-10-CM | POA: Insufficient documentation

## 2018-05-11 DIAGNOSIS — G90512 Complex regional pain syndrome I of left upper limb: Secondary | ICD-10-CM

## 2018-05-11 DIAGNOSIS — M5416 Radiculopathy, lumbar region: Secondary | ICD-10-CM | POA: Insufficient documentation

## 2018-05-11 DIAGNOSIS — I10 Essential (primary) hypertension: Secondary | ICD-10-CM | POA: Diagnosis not present

## 2018-05-11 DIAGNOSIS — E785 Hyperlipidemia, unspecified: Secondary | ICD-10-CM | POA: Insufficient documentation

## 2018-05-11 DIAGNOSIS — M797 Fibromyalgia: Secondary | ICD-10-CM | POA: Insufficient documentation

## 2018-05-11 DIAGNOSIS — Z90710 Acquired absence of both cervix and uterus: Secondary | ICD-10-CM | POA: Diagnosis not present

## 2018-05-11 DIAGNOSIS — G9050 Complex regional pain syndrome I, unspecified: Secondary | ICD-10-CM | POA: Diagnosis not present

## 2018-05-11 DIAGNOSIS — M609 Myositis, unspecified: Secondary | ICD-10-CM | POA: Diagnosis not present

## 2018-05-11 DIAGNOSIS — G894 Chronic pain syndrome: Secondary | ICD-10-CM

## 2018-05-11 DIAGNOSIS — Z9049 Acquired absence of other specified parts of digestive tract: Secondary | ICD-10-CM | POA: Insufficient documentation

## 2018-05-11 DIAGNOSIS — M461 Sacroiliitis, not elsewhere classified: Secondary | ICD-10-CM | POA: Insufficient documentation

## 2018-05-11 DIAGNOSIS — M25512 Pain in left shoulder: Secondary | ICD-10-CM

## 2018-05-11 DIAGNOSIS — Z809 Family history of malignant neoplasm, unspecified: Secondary | ICD-10-CM | POA: Insufficient documentation

## 2018-05-11 DIAGNOSIS — G8929 Other chronic pain: Secondary | ICD-10-CM

## 2018-05-11 DIAGNOSIS — M5010 Cervical disc disorder with radiculopathy, unspecified cervical region: Secondary | ICD-10-CM | POA: Diagnosis not present

## 2018-05-11 DIAGNOSIS — M47817 Spondylosis without myelopathy or radiculopathy, lumbosacral region: Secondary | ICD-10-CM

## 2018-05-11 DIAGNOSIS — Z5181 Encounter for therapeutic drug level monitoring: Secondary | ICD-10-CM

## 2018-05-11 DIAGNOSIS — E119 Type 2 diabetes mellitus without complications: Secondary | ICD-10-CM | POA: Diagnosis not present

## 2018-05-11 DIAGNOSIS — M542 Cervicalgia: Secondary | ICD-10-CM

## 2018-05-11 DIAGNOSIS — Z87891 Personal history of nicotine dependence: Secondary | ICD-10-CM | POA: Diagnosis not present

## 2018-05-11 MED ORDER — HYDROCODONE-ACETAMINOPHEN 7.5-325 MG PO TABS: 1.0000 | ORAL_TABLET | Freq: Four times a day (QID) | ORAL | 0 refills | Status: DC | PRN

## 2018-05-11 MED ORDER — AMITRIPTYLINE HCL 50 MG PO TABS: 25.0000 mg | ORAL_TABLET | Freq: Every day | ORAL | 2 refills | Status: DC

## 2018-05-11 NOTE — Patient Instructions (Signed)
PLEASE FEEL FREE TO CALL OUR OFFICE WITH ANY PROBLEMS OR QUESTIONS ([phone])      

## 2018-05-11 NOTE — Progress Notes (Signed)
Subjective:    Patient ID: Shannon French, female    DOB: Jan 11, 1966, 52 y.o.   MRN: [id number]  HPI Shannon French is here in follow-up of her chronic pain.  I last saw her in August. She has almost completed her PT for her shoulder and much improved. .   1. Focal superior labral tear at the 12 to 1 o'clock position. 2. Increased soft tissue of the rotator cuff interval thickening of the inferior glenohumeral ligament. These findings have been associated with adhesive capsulitis. 3. Normal rotator cuff.  She is back to going to the gym and working out.  She is has been in the pool a bit as well.  She is not doing any resistance exercises.  Over the last couple months she notices increased cough and problems with snoring at nighttime.  She also has noticed increased headaches.  We did increase her amitriptyline a few months ago to 50 mg at nighttime.  Other than that there have been no medication changes.  She is been on lisinopril for 7 or 8 years.  For pain control she remains on Norco 7.51 every 6 hours as needed.   Pain Inventory Average Pain 5 Pain Right Now 5 My pain is constant, sharp, burning, dull, stabbing, tingling and aching  In the last 24 hours, has pain interfered with the following? General activity 5 Relation with others 5 Enjoyment of life 5 What TIME of day is your pain at its worst? morning Sleep (in general) Fair  Pain is worse with: walking, bending, sitting, inactivity, standing and some activites Pain improves with: rest, heat/ice, therapy/exercise, medication and injections Relief from Meds: 5  Mobility walk without assistance how many minutes can you walk? 30 ability to climb steps?  yes do you drive?  yes Do you have any goals in this area?  yes  Function disabled: date disabled . I need assistance with the following:  meal prep, household duties and shopping Do you have any goals in this area?   yes  Neuro/Psych weakness numbness tingling spasms anxiety  Prior Studies Any changes since last visit?  no  Physicians involved in your care Any changes since last visit?  no   Family History  Problem Relation Age of Onset  . Cancer Father    Social History   Socioeconomic History  . Marital status: Single    Spouse name: Not on file  . Number of children: Not on file  . Years of education: Not on file  . Highest education level: Not on file  Occupational History  . Not on file  Social Needs  . Financial resource strain: Not on file  . Food insecurity:    Worry: Not on file    Inability: Not on file  . Transportation needs:    Medical: Not on file    Non-medical: Not on file  Tobacco Use  . Smoking status: Former Smoker    Last attempt to quit: 10/14/2009    Years since quitting: 8.5  . Smokeless tobacco: Never Used  Substance and Sexual Activity  . Alcohol use: No  . Drug use: No  . Sexual activity: Not on file  Lifestyle  . Physical activity:    Days per week: Not on file    Minutes per session: Not on file  . Stress: Not on file  Relationships  . Social connections:    Talks on phone: Not on file    Gets together: Not on file  Attends religious service: Not on file    Active member of club or organization: Not on file    Attends meetings of clubs or organizations: Not on file    Relationship status: Not on file  Other Topics Concern  . Not on file  Social History Narrative  . Not on file   Past Surgical History:  Procedure Laterality Date  . ABDOMINAL HYSTERECTOMY    . ABDOMINAL SURGERY    . APPENDECTOMY    . CHOLECYSTECTOMY    . CYST EXCISION    . OVARY SURGERY     Past Medical History:  Diagnosis Date  . Arm pain    left  . Degenerative disc disease   . Diabetes mellitus   . Fibromyalgia   . Hyperlipidemia   . Hypertension   . IBS (irritable bowel syndrome)   . Osteoporosis    BP 128/85   Pulse 95   Resp 14   Ht 5\' 4"   (1.626 m)   Wt 154 lb (69.9 kg)   SpO2 96%   BMI 26.43 kg/m   Opioid Risk Score:   Fall Risk Score:  `1  Depression screen PHQ 2/9  Depression screen [facility] 2/9 07/22/2017 11/12/2016 08/12/2016 12/27/2015 11/01/2015 03/20/2015 11/04/2014  Decreased Interest 0 0 0 0 0 0 0  Down, Depressed, Hopeless 0 0 0 0 0 0 0  PHQ - 2 Score 0 0 0 0 0 0 0  Altered sleeping - - - - 0 - 3  Tired, decreased energy - - - - 0 - 1  Change in appetite - - - - 0 - 0  Feeling bad or failure about yourself  - - - - 0 - 0  Trouble concentrating - - - - 0 - 0  Moving slowly or fidgety/restless - - - - 0 - 0  Suicidal thoughts - - - - 0 - 0  PHQ-9 Score - - - - 0 - 4    Review of Systems  Constitutional: Positive for diaphoresis.  HENT: Negative.   Eyes: Negative.   Respiratory: Positive for cough.   Gastrointestinal: Negative.   Endocrine:       High blood sugar  Genitourinary: Negative.   Musculoskeletal: Positive for arthralgias, back pain, neck pain and neck stiffness.       Spasms   Skin: Negative.   Allergic/Immunologic: Negative.   Neurological: Positive for weakness and numbness.       Tingling  Hematological: Negative.   Psychiatric/Behavioral: The patient is nervous/anxious.        Objective:   Physical Exam  Physical Exam General: No acute distress HEENT: EOMI, oral membranes moist Cards: reg rate  Chest: normal effort Abdomen: Soft, NT, ND Skin: dry, intact Extremities: no edema   Musculoskeletal:no allodynia left shoulder. Full ROM with ER/IR/abduction with little to no pain. No crepitus.  Neurological:4/5 right ADF, APF, KE, HF.4+/5 on leftgrossly. UE motor normal as is sensationis intact.  UE DTR's are 1+.   Psychiatric: pleasant  .  Assessment & Plan:  ASSESSMENT:  1. Bilateral sacroiliac joint dysfunction, status post radio  frequencies which have helped her. 2. Chronic pain syndrome consistent with fibromyalgia.  3. ?Right lumbar radiculopathy  3. Type 2  diabetes.  4. CRPS 1? LU E, Recent EMG/NCS --negative 5. Left adhesive capsulitis and labral tear   PLAN:  1. Tizanidine for spasms and sleep. meloxicam prn for pain flares  2.Complete outpt therapies. Maintain HEP. Asked her to avoid weight lifting exercises  LUE.   3. Hydrocodonewas refilled. Medication was refilled and a second prescription was sent to the patient's pharmacy for next month.   -We will continue the controlled substance monitoring program, this consists of regular clinic visits, examinations, routine drug screening, pill counts as well as use of [place] Controlled Substance Reporting System. [place]CC[place] was reviewed today.   4. Consider followupSGNB's at some point.no need currently -also consider RF if symptoms dictate--stable at present 5. Continue elavillat 50mg  qhs           -decrease to 25mg  qhs to see if it helps with dry mouth, snoring, cough           -intense snoring at night me be actually exacerbating headache           -continue cymbalta at 90mg  -Continue Tegretol 200mg  bid 6. Follow up withme in about 2 months 15 minutes of face to face patient care time were spent during this visit. All questions were encouraged and answered.

## 2018-05-14 ENCOUNTER — Ambulatory Visit: Payer: Medicare Other | Attending: Physical Medicine & Rehabilitation

## 2018-05-14 DIAGNOSIS — M25512 Pain in left shoulder: Secondary | ICD-10-CM | POA: Diagnosis present

## 2018-05-14 DIAGNOSIS — G8929 Other chronic pain: Secondary | ICD-10-CM | POA: Insufficient documentation

## 2018-05-14 DIAGNOSIS — M25612 Stiffness of left shoulder, not elsewhere classified: Secondary | ICD-10-CM | POA: Insufficient documentation

## 2018-05-14 NOTE — Therapy (Signed)
[place], [place], [address] Phone: [phone]   Fax:  [phone]  Physical Therapy Treatment  Patient Details  Name: Shannon French MRN: [id number] Date of Birth: 1965-09-09 Referring Provider (PT): Alger Simons MD   Encounter Date: 05/14/2018  PT End of Session - 05/14/18 1121    Visit Number  14    Number of Visits  16    Date for PT Re-Evaluation  06/02/18    Authorization Type  [employer] MCR, applied for 8 additional visits and date extension on 04/02/18    PT Start Time  1115    PT Stop Time  1130    PT Time Calculation (min)  15 min    Activity Tolerance  Treatment limited secondary to medical complications (Comment)    Behavior During Therapy  [facility] for tasks assessed/performed       Past Medical History:  Diagnosis Date  . Arm pain    left  . Degenerative disc disease   . Diabetes mellitus   . Fibromyalgia   . Hyperlipidemia   . Hypertension   . IBS (irritable bowel syndrome)   . Osteoporosis     Past Surgical History:  Procedure Laterality Date  . ABDOMINAL HYSTERECTOMY    . ABDOMINAL SURGERY    . APPENDECTOMY    . CHOLECYSTECTOMY    . CYST EXCISION    . OVARY SURGERY      There were no vitals filed for this visit.  Subjective Assessment - 05/14/18 1115    Subjective  Shoulder doing great. No weights at gym for arms.    No pain to start.    Currently in Pain?  No/denies         [facility] PT Assessment - 05/14/18 0001      Assessment   Medical Diagnosis  L RTC syndrom    Referring Provider (PT)  Alger Simons MD      AROM   Right Shoulder Flexion  160 Degrees    Right Shoulder ABduction  150 Degrees    Left Shoulder Extension  65 Degrees    Left Shoulder Flexion  145 Degrees    Left Shoulder ABduction  140 Degrees    Left Shoulder Internal Rotation  65 Degrees    Left Shoulder External Rotation  90 Degrees      Strength   Overall Strength Comments  Grossly  WNL  bilaterally    with no pain on testing  shoulder.                              PT Short Term Goals - 04/14/18 1212      PT SHORT TERM GOAL #1   Title  She will be independent with initial HEP    Baseline  independent    Status  Achieved      PT SHORT TERM GOAL #2   Title  Active  Lt shoulder flexion to 130 degrees to work toward independent self care    Time  3    Period  Weeks    Status  Achieved      PT SHORT TERM GOAL #3   Title  She will report pain decr 20% or more with general activity    Status  Achieved        PT Long Term Goals - 05/14/18 1129      PT LONG TERM GOAL #1   Title  She will be indpendent with all HEP issued    Status  Achieved      PT LONG TERM GOAL #2   Title  She will report pain decr 50% or more generally at rest    Status  Achieved      PT LONG TERM GOAL #3   Title  She will be able to wash hair without assistance     Status  Achieved      PT LONG TERM GOAL #4   Title  She will report periods of no pain..     Baseline  consistant with periods of no pain    Status  Achieved      PT LONG TERM GOAL #5   Title  She will be able to dress donning shirt with minimal pain    Status  Achieved      PT LONG TERM GOAL #6   Title  FOTO score improved to 50% or more to demo improvement    Status  Achieved            Plan - 05/14/18 1127    Clinical Impression Statement  She reported she is ready for discharge. She did not need treatment but wanted to have final measures  do discharge paperwork ( FOTO)  . She reports doing HEP consistently and going to gym within restrictions per MD. She is signed up for [employer] class in town and is looking forward to this. She had no pain today. Her FOTO score exceeded expectations  decr from 66% limited to 39 % limited when expected to score 44% on discharge.       PT Treatment/Interventions  Dry needling;Patient/family education;Therapeutic exercise;Manual techniques;Taping;Moist  Heat;Therapeutic activities    PT Next Visit Plan  Discharge with HEP    PT Home Exercise Plan  Scapula retraction, neck side bend stretch,  single arm doorway stretch and sitting ER stretch with arm on table, isometrics,  shoulder bands,  green    Consulted and Agree with Plan of Care  Patient       Patient will benefit from skilled therapeutic intervention in order to improve the following deficits and impairments:  Pain, Decreased activity tolerance, Decreased range of motion, Postural dysfunction, Increased muscle spasms, Impaired UE functional use  Visit Diagnosis: Stiffness of left shoulder, not elsewhere classified  Chronic left shoulder pain     Problem List Patient Active Problem List   Diagnosis Date Noted  . Chronic left shoulder pain 05/11/2018  . Adhesive capsulitis of left shoulder 03/09/2018  . Chronic pain of both shoulders 01/05/2018  . Chronic pain syndrome 01/05/2018  . CRPS (complex regional pain syndrome), type I, upper 10/27/2013  . Cervical disc disorder with radiculopathy of cervical region 10/27/2013  . Lumbosacral spondylosis without myelopathy 12/13/2011  . Muscle pain, fibromyalgia 12/13/2011  . Bilateral sacroiliitis ([place]) 12/13/2011  . Thoracic or lumbosacral neuritis or radiculitis, unspecified 12/13/2011    Darrel Hoover  PT 05/14/2018, 11:32 AM  [facility] [address] [place], [place], [address] Phone: [phone]   Fax:  [phone]  Name: Shannon French MRN: [id number] Date of Birth: 1966-04-22

## 2018-07-08 ENCOUNTER — Encounter: Payer: Self-pay | Admitting: Physical Medicine & Rehabilitation

## 2018-07-08 ENCOUNTER — Encounter: Payer: Medicare Other | Attending: Physical Medicine & Rehabilitation | Admitting: Physical Medicine & Rehabilitation

## 2018-07-08 ENCOUNTER — Other Ambulatory Visit: Payer: Self-pay

## 2018-07-08 VITALS — BP 158/89 | HR 86 | Ht 64.0 in | Wt 152.0 lb

## 2018-07-08 DIAGNOSIS — M810 Age-related osteoporosis without current pathological fracture: Secondary | ICD-10-CM | POA: Insufficient documentation

## 2018-07-08 DIAGNOSIS — M47817 Spondylosis without myelopathy or radiculopathy, lumbosacral region: Secondary | ICD-10-CM | POA: Diagnosis not present

## 2018-07-08 DIAGNOSIS — M25512 Pain in left shoulder: Secondary | ICD-10-CM

## 2018-07-08 DIAGNOSIS — Z90710 Acquired absence of both cervix and uterus: Secondary | ICD-10-CM | POA: Diagnosis not present

## 2018-07-08 DIAGNOSIS — M5010 Cervical disc disorder with radiculopathy, unspecified cervical region: Secondary | ICD-10-CM | POA: Insufficient documentation

## 2018-07-08 DIAGNOSIS — Z79899 Other long term (current) drug therapy: Secondary | ICD-10-CM | POA: Insufficient documentation

## 2018-07-08 DIAGNOSIS — Z9889 Other specified postprocedural states: Secondary | ICD-10-CM | POA: Insufficient documentation

## 2018-07-08 DIAGNOSIS — M5416 Radiculopathy, lumbar region: Secondary | ICD-10-CM | POA: Diagnosis not present

## 2018-07-08 DIAGNOSIS — E119 Type 2 diabetes mellitus without complications: Secondary | ICD-10-CM | POA: Insufficient documentation

## 2018-07-08 DIAGNOSIS — Z87891 Personal history of nicotine dependence: Secondary | ICD-10-CM | POA: Diagnosis not present

## 2018-07-08 DIAGNOSIS — G9050 Complex regional pain syndrome I, unspecified: Secondary | ICD-10-CM | POA: Diagnosis not present

## 2018-07-08 DIAGNOSIS — E785 Hyperlipidemia, unspecified: Secondary | ICD-10-CM | POA: Insufficient documentation

## 2018-07-08 DIAGNOSIS — K589 Irritable bowel syndrome without diarrhea: Secondary | ICD-10-CM | POA: Insufficient documentation

## 2018-07-08 DIAGNOSIS — G8929 Other chronic pain: Secondary | ICD-10-CM

## 2018-07-08 DIAGNOSIS — I10 Essential (primary) hypertension: Secondary | ICD-10-CM | POA: Diagnosis not present

## 2018-07-08 DIAGNOSIS — M7502 Adhesive capsulitis of left shoulder: Secondary | ICD-10-CM

## 2018-07-08 DIAGNOSIS — G894 Chronic pain syndrome: Secondary | ICD-10-CM | POA: Diagnosis present

## 2018-07-08 DIAGNOSIS — Z5181 Encounter for therapeutic drug level monitoring: Secondary | ICD-10-CM | POA: Insufficient documentation

## 2018-07-08 DIAGNOSIS — G90512 Complex regional pain syndrome I of left upper limb: Secondary | ICD-10-CM

## 2018-07-08 DIAGNOSIS — M797 Fibromyalgia: Secondary | ICD-10-CM | POA: Diagnosis not present

## 2018-07-08 DIAGNOSIS — Z9049 Acquired absence of other specified parts of digestive tract: Secondary | ICD-10-CM | POA: Insufficient documentation

## 2018-07-08 DIAGNOSIS — M461 Sacroiliitis, not elsewhere classified: Secondary | ICD-10-CM | POA: Insufficient documentation

## 2018-07-08 DIAGNOSIS — M609 Myositis, unspecified: Secondary | ICD-10-CM | POA: Diagnosis not present

## 2018-07-08 DIAGNOSIS — M542 Cervicalgia: Secondary | ICD-10-CM

## 2018-07-08 DIAGNOSIS — Z809 Family history of malignant neoplasm, unspecified: Secondary | ICD-10-CM | POA: Diagnosis not present

## 2018-07-08 MED ORDER — HYDROCODONE-ACETAMINOPHEN 7.5-325 MG PO TABS: 1.0000 | ORAL_TABLET | Freq: Four times a day (QID) | ORAL | 0 refills | Status: DC | PRN

## 2018-07-08 MED ORDER — DICLOFENAC SODIUM 1 % TD GEL: 2.0000 g | Freq: Four times a day (QID) | TRANSDERMAL | 2 refills | Status: DC

## 2018-07-08 MED ORDER — DICLOFENAC SODIUM 75 MG PO TBEC: 75.0000 mg | DELAYED_RELEASE_TABLET | Freq: Two times a day (BID) | ORAL | 2 refills | Status: DC

## 2018-07-08 NOTE — Patient Instructions (Signed)
PLEASE FEEL FREE TO CALL OUR OFFICE WITH ANY PROBLEMS OR QUESTIONS ([phone])      

## 2018-07-08 NOTE — Progress Notes (Signed)
Subjective:    Patient ID: Shannon French, female    DOB: Apr 07, 1966, 52 y.o.   MRN: [id number]  HPI   Shannon French is here in follow up of her chronic pain. She has been a little sore with the cooler weather but has been managing and staying active.  She is exercising daily.  She works on home exercise program for left shoulder on a routine basis.  She is focusing more on range of motion.  She walks daily.  She tried decreasing the amitriptyline 25 mg but not noticed much of a difference in her dry mouth.  She has started taking some Biotene and chewing bubblegum which seems to help with her moisture and salivation.  She also tried decreasing some of the medications but did not notice much difference.  She remains on hydrocodone for her baseline pain control.  She also is using amitriptyline as mentioned above as well as Tegretol and Cymbalta.  She utilizes diclofenac oral for arthritic pain and also the gel form for her superficial joints.  Pain Inventory Average Pain 5 Pain Right Now 6 My pain is constant, sharp, burning, dull, stabbing, tingling and aching  In the last 24 hours, has pain interfered with the following? General activity 5 Relation with others 5 Enjoyment of life 5 What TIME of day is your pain at its worst? morning and night Sleep (in general) Fair  Pain is worse with: walking, bending, sitting, inactivity, standing and some activites Pain improves with: rest, heat/ice, therapy/exercise, pacing activities and medication Relief from Meds: 5  Mobility walk without assistance how many minutes can you walk? 30 ability to climb steps?  yes do you drive?  yes  Function disabled: date disabled 2003 I need assistance with the following:  meal prep, household duties and shopping  Neuro/Psych weakness spasms anxiety  Prior Studies Any changes since last visit?  no  Physicians involved in your care Any changes since last visit?  no   Family History  Problem Relation  Age of Onset  . Cancer Father    Social History   Socioeconomic History  . Marital status: Single    Spouse name: Not on file  . Number of children: Not on file  . Years of education: Not on file  . Highest education level: Not on file  Occupational History  . Not on file  Social Needs  . Financial resource strain: Not on file  . Food insecurity:    Worry: Not on file    Inability: Not on file  . Transportation needs:    Medical: Not on file    Non-medical: Not on file  Tobacco Use  . Smoking status: Former Smoker    Last attempt to quit: 10/14/2009    Years since quitting: 8.7  . Smokeless tobacco: Never Used  Substance and Sexual Activity  . Alcohol use: No  . Drug use: No  . Sexual activity: Not on file  Lifestyle  . Physical activity:    Days per week: Not on file    Minutes per session: Not on file  . Stress: Not on file  Relationships  . Social connections:    Talks on phone: Not on file    Gets together: Not on file    Attends religious service: Not on file    Active member of club or organization: Not on file    Attends meetings of clubs or organizations: Not on file    Relationship status: Not on file  Other Topics Concern  . Not on file  Social History Narrative  . Not on file   Past Surgical History:  Procedure Laterality Date  . ABDOMINAL HYSTERECTOMY    . ABDOMINAL SURGERY    . APPENDECTOMY    . CHOLECYSTECTOMY    . CYST EXCISION    . OVARY SURGERY     Past Medical History:  Diagnosis Date  . Arm pain    left  . Degenerative disc disease   . Diabetes mellitus   . Fibromyalgia   . Hyperlipidemia   . Hypertension   . IBS (irritable bowel syndrome)   . Osteoporosis    BP (!) 158/89   Pulse 86   Ht 5\' 4"  (1.626 m)   Wt 152 lb (68.9 kg)   SpO2 95%   BMI 26.09 kg/m   Opioid Risk Score:   Fall Risk Score:  `1  Depression screen PHQ 2/9  [facility] 07/08/2018 07/22/2017 11/12/2016 08/12/2016 12/27/2015 11/01/2015 03/20/2015   Decreased Interest 0 0 0 0 0 0 0  Down, Depressed, Hopeless 0 0 0 0 0 0 0  PHQ - 2 Score 0 0 0 0 0 0 0  Altered sleeping - - - - - 0 -  Tired, decreased energy - - - - - 0 -  Change in appetite - - - - - 0 -  Feeling bad or failure about yourself  - - - - - 0 -  Trouble concentrating - - - - - 0 -  Moving slowly or fidgety/restless - - - - - 0 -  Suicidal thoughts - - - - - 0 -  PHQ-9 Score - - - - - 0 -    Review of Systems  Constitutional: Negative.   HENT: Negative.   Eyes: Negative.   Respiratory: Positive for cough.   Cardiovascular: Negative.   Gastrointestinal: Negative.   Endocrine: Negative.   Genitourinary: Negative.   Musculoskeletal: Negative.   Skin: Negative.   Allergic/Immunologic: Negative.   Neurological: Negative.   Hematological: Negative.   Psychiatric/Behavioral: Negative.   All other systems reviewed and are negative.      Objective:   Physical Exam   . General: Alert and oriented x 3, No apparent distress HEENT: Head is normocephalic, atraumatic, PERRLA, EOMI, sclera anicteric, oral mucosa pink and moist, dentition intact, ext ear canals clear,  Neck: Supple without JVD or lymphadenopathy Heart: Reg rate and rhythm. No murmurs rubs or gallops Chest: CTA bilaterally without wheezes, rales, or rhonchi; no distress Abdomen: Soft, non-tender, non-distended, bowel sounds positive. Extremities: No clubbing, cyanosis, or edema. Pulses are 2+ Skin: Clean and intact without signs of breakdown Neuro: Pt is cognitively appropriate with normal insight, memory, and awareness. Cranial nerves 2-12 are intact. Sensory exam is normal. Reflexes are 2+ in all 4's. Fine motor coordination is intact. No tremors. Motor function is grossly 5/5.  Musculoskeletal: Improve range of motion left shoulder.  Still does not have full abduction but can easily reach her head and neck as well as face now.  Mild pain with impingement maneuvers.  Low back remains somewhat tender  with flexion and extension but she demonstrates functional ambulation and range of motion in the low back and pelvis. Psych: Patient is pleasant and cooperative as always.      ASSESSMENT:  1. Bilateral sacroiliac joint dysfunction, status post radio  frequencies ablation2. Chronic pain syndrome consistent with fibromyalgia.  3. ?Right lumbar radiculopathy  3. Type 2 diabetes.  4. CRPS 1? LU E, Recent EMG/NCS --negative 5. Left adhesive capsulitis and labral tear--- much improved with therapy and ongoing home exercise program.    PLAN:  1. Tizanidine for spasms and sleep. meloxicam prn for pain flares  2. Maintain home exercise program.  Focus on appropriate mechanics and range of motion.  Would like this to be pain free essentially before she adds weight..  3. Hydrocodonewas refilled.    -We will continue the controlled substance monitoring program, this consists of regular clinic visits, examinations, routine drug screening, pill counts as well as use of [place] Controlled Substance Reporting System. [place] was [place] today.    -Medication was refilled and a second prescription was sent to the patient's pharmacy for next month.   4. Consider followupSGNB's at some point.no need currently -also consider RF if symptoms dictate--not indicated at present 5. Continue elavillat 50mg  qhs           -biotene, sour candy, gum for salivation, moisture.          -continue cymbalta at 90mg  -Continue Tegretol 200mg  bid 6.  Refilled diclofenac oral 75 mg twice daily #180 for 33-month fill.  Also filled her Voltaren gel for 3 months.   7. Follow up withme in about 2 months 15 minutes of face to face patient care time were spent during this visit. All questions were encouraged and answered.

## 2018-07-15 ENCOUNTER — Other Ambulatory Visit: Payer: Self-pay | Admitting: Physical Medicine & Rehabilitation

## 2018-09-06 ENCOUNTER — Other Ambulatory Visit: Payer: Self-pay | Admitting: Physical Medicine & Rehabilitation

## 2018-09-08 ENCOUNTER — Encounter: Payer: Medicare Other | Attending: Physical Medicine & Rehabilitation | Admitting: Physical Medicine & Rehabilitation

## 2018-09-08 ENCOUNTER — Encounter: Payer: Self-pay | Admitting: Physical Medicine & Rehabilitation

## 2018-09-08 DIAGNOSIS — Z90710 Acquired absence of both cervix and uterus: Secondary | ICD-10-CM | POA: Insufficient documentation

## 2018-09-08 DIAGNOSIS — Z5181 Encounter for therapeutic drug level monitoring: Secondary | ICD-10-CM | POA: Diagnosis not present

## 2018-09-08 DIAGNOSIS — M47817 Spondylosis without myelopathy or radiculopathy, lumbosacral region: Secondary | ICD-10-CM | POA: Diagnosis not present

## 2018-09-08 DIAGNOSIS — K589 Irritable bowel syndrome without diarrhea: Secondary | ICD-10-CM | POA: Insufficient documentation

## 2018-09-08 DIAGNOSIS — Z9049 Acquired absence of other specified parts of digestive tract: Secondary | ICD-10-CM | POA: Diagnosis not present

## 2018-09-08 DIAGNOSIS — E119 Type 2 diabetes mellitus without complications: Secondary | ICD-10-CM | POA: Diagnosis not present

## 2018-09-08 DIAGNOSIS — M609 Myositis, unspecified: Secondary | ICD-10-CM | POA: Insufficient documentation

## 2018-09-08 DIAGNOSIS — G894 Chronic pain syndrome: Secondary | ICD-10-CM

## 2018-09-08 DIAGNOSIS — M810 Age-related osteoporosis without current pathological fracture: Secondary | ICD-10-CM | POA: Insufficient documentation

## 2018-09-08 DIAGNOSIS — G9050 Complex regional pain syndrome I, unspecified: Secondary | ICD-10-CM | POA: Insufficient documentation

## 2018-09-08 DIAGNOSIS — M461 Sacroiliitis, not elsewhere classified: Secondary | ICD-10-CM | POA: Insufficient documentation

## 2018-09-08 DIAGNOSIS — Z79899 Other long term (current) drug therapy: Secondary | ICD-10-CM | POA: Diagnosis not present

## 2018-09-08 DIAGNOSIS — I10 Essential (primary) hypertension: Secondary | ICD-10-CM | POA: Diagnosis not present

## 2018-09-08 DIAGNOSIS — Z809 Family history of malignant neoplasm, unspecified: Secondary | ICD-10-CM | POA: Insufficient documentation

## 2018-09-08 DIAGNOSIS — M797 Fibromyalgia: Secondary | ICD-10-CM | POA: Insufficient documentation

## 2018-09-08 DIAGNOSIS — M5416 Radiculopathy, lumbar region: Secondary | ICD-10-CM | POA: Insufficient documentation

## 2018-09-08 DIAGNOSIS — E785 Hyperlipidemia, unspecified: Secondary | ICD-10-CM | POA: Diagnosis not present

## 2018-09-08 DIAGNOSIS — M5010 Cervical disc disorder with radiculopathy, unspecified cervical region: Secondary | ICD-10-CM | POA: Diagnosis not present

## 2018-09-08 DIAGNOSIS — Z9889 Other specified postprocedural states: Secondary | ICD-10-CM | POA: Insufficient documentation

## 2018-09-08 DIAGNOSIS — Z87891 Personal history of nicotine dependence: Secondary | ICD-10-CM | POA: Insufficient documentation

## 2018-09-08 MED ORDER — HYDROCODONE-ACETAMINOPHEN 7.5-325 MG PO TABS: 1.0000 | ORAL_TABLET | Freq: Four times a day (QID) | ORAL | 0 refills | Status: DC | PRN

## 2018-09-08 NOTE — Progress Notes (Signed)
Subjective:    Patient ID: Shannon French, female    DOB: 12/28/1965, 53 y.o.   MRN: [id number]  HPI   Shannon French is here in follow up of her chronic pain. She is going through some major struggles with her daughter's health who apparently in renal and pancreatic failure due to her type 1 diabetes.   For pain she remains on hydrocodone 7.5/325 4 times daily as needed.  She also is on Cymbalta, and amitriptyline.  She takes Voltaren orally and topically as well.  From a pain standpoint she is doing fairly well.  She tries to stay active and has been staying on top of her diet and weight.  She gets to the gym once a week and also likes to go outside and walk.  Her biggest problem, frankly, is her emotions.  She has a lot of stress building up and anxiety due to her daughter's condition which is serious.  She is talking with her primary her about going for counseling.   Pain Inventory Average Pain 5 Pain Right Now 5 My pain is constant, sharp, burning, dull, stabbing, tingling and aching  In the last 24 hours, has pain interfered with the following? General activity 5 Relation with others 5 Enjoyment of life 5 What TIME of day is your pain at its worst? all Sleep (in general) Fair  Pain is worse with: walking, bending, sitting, inactivity, standing, unsure and some activites Pain improves with: rest, heat/ice, therapy/exercise, pacing activities and medication Relief from Meds: 6  Mobility walk without assistance how many minutes can you walk? 30 ability to climb steps?  yes do you drive?  yes  Function disabled: date disabled 2003 I need assistance with the following:  meal prep, household duties and shopping  Neuro/Psych weakness numbness tingling spasms anxiety  Prior Studies Any changes since last visit?  no  Physicians involved in your care Any changes since last visit?  no   Family History  Problem Relation Age of Onset  . Cancer Father    Social History    Socioeconomic History  . Marital status: Single    Spouse name: Not on file  . Number of children: Not on file  . Years of education: Not on file  . Highest education level: Not on file  Occupational History  . Not on file  Social Needs  . Financial resource strain: Not on file  . Food insecurity:    Worry: Not on file    Inability: Not on file  . Transportation needs:    Medical: Not on file    Non-medical: Not on file  Tobacco Use  . Smoking status: Former Smoker    Last attempt to quit: 10/14/2009    Years since quitting: 8.9  . Smokeless tobacco: Never Used  Substance and Sexual Activity  . Alcohol use: No  . Drug use: No  . Sexual activity: Not on file  Lifestyle  . Physical activity:    Days per week: Not on file    Minutes per session: Not on file  . Stress: Not on file  Relationships  . Social connections:    Talks on phone: Not on file    Gets together: Not on file    Attends religious service: Not on file    Active member of club or organization: Not on file    Attends meetings of clubs or organizations: Not on file    Relationship status: Not on file  Other Topics Concern  .  Not on file  Social History Narrative  . Not on file   Past Surgical History:  Procedure Laterality Date  . ABDOMINAL HYSTERECTOMY    . ABDOMINAL SURGERY    . APPENDECTOMY    . CHOLECYSTECTOMY    . CYST EXCISION    . OVARY SURGERY     Past Medical History:  Diagnosis Date  . Arm pain    left  . Degenerative disc disease   . Diabetes mellitus   . Fibromyalgia   . Hyperlipidemia   . Hypertension   . IBS (irritable bowel syndrome)   . Osteoporosis    BP 125/81   Pulse 79   Ht 5\' 4"  (1.626 m)   Wt 153 lb 12.8 oz (69.8 kg)   SpO2 94%   BMI 26.40 kg/m   Opioid Risk Score:   Fall Risk Score:  `1  Depression screen PHQ 2/9  Dep[facility] screen [facility]/9 09/08/2018 07/08/2018 07/22/2017 11/12/2016 08/12/2016 12/27/2015 11/01/2015  Decreased Interest 0 0 0 0 0 0 0  Down,  Depressed, Hopeless 0 0 0 0 0 0 0  PHQ - 2 Score 0 0 0 0 0 0 0  Altered sleeping - - - - - - 0  Tired, decreased energy - - - - - - 0  Change in appetite - - - - - - 0  Feeling bad or failure about yourself  - - - - - - 0  Trouble concentrating - - - - - - 0  Moving slowly or fidgety/restless - - - - - - 0  Suicidal thoughts - - - - - - 0  PHQ-9 Score - - - - - - 0   Review of Systems  Constitutional: Negative.   HENT: Negative.   Eyes: Negative.   Respiratory: Negative.   Cardiovascular: Negative.   Gastrointestinal: Negative.   Endocrine: Negative.   Genitourinary: Negative.   Musculoskeletal: Negative.   Skin: Negative.   Allergic/Immunologic: Negative.   Neurological: Negative.   Hematological: Negative.   Psychiatric/Behavioral: Negative.   All other systems reviewed and are negative.      Objective:   Physical Exam General: No acute distress HEENT: EOMI, oral membranes moist Cards: reg rate  Chest: normal effort Abdomen: Soft, NT, ND Skin: dry, intact Extremities: no edema Skin: Clean and intact without signs of breakdown Neuro: Pt is cognitively appropriate with normal insight, memory, and awareness. Cranial nerves 2-12 are intact. Sensory exam is normal. Reflexes are 2+ in all 4's. Fine motor coordination is intact. No tremors. Motor function is grossly 5/5.  Musculoskeletal: I left shoulder remains somewhat limited with range of motion but movement is functional.  Low back is tender to palpation and somewhat tender with extension and flexion.  Gait is normal with appropriate weight shift and stride. Psych: Patient is pleasant but became tearful at times today given her situation.      ASSESSMENT:  1. Bilateral sacroiliac joint dysfunction, status post radio  frequencies ablation2. Chronic pain syndrome consistent with fibromyalgia.  3. ?Right lumbar radiculopathy  3. Type 2 diabetes.  4. CRPS 1? LU E, Recent EMG/NCS --negative 5.Left adhesive  capsulitis and labral tear--- much improved with therapy and ongoing home exercise program.    PLAN:  1. Tizanidine for spasms and sleep. meloxicam prn for pain flares  2. Maintain home exercise program.  Focus on appropriate mechanics and range of motion.     -Maintain weight management moving forward. She is doing a good job with  her HEP.Marland Kitchen 3. Hydrocodonewas refilled.          -We will continue the controlled substance monitoring program, this consists of regular clinic visits, examinations, routine drug screening, pill counts as well as use of [place] Controlled Substance Reporting System. [place] was reviewed today.              -Medication was refilled and a second prescription was sent to the patient's pharmacy for next month.     4. Consider followupSGNB's at some point.no need currently -also consider RF if symptoms dictate---still not indicated at present 5. Continue elavillat 50mg  qhs -biotene, sour candy, gum for salivation, moisture have been discussed. -continue cymbalta at 90mg  -Continue Tegretol 200mg  bid 6.  Continue diclofenac oral 75 mg twice daily #180 for 32-month fill.   Voltaren gel rx.   7.  Discussed the need to maintain her mental health.  I am totally in supportive counseling.  We can even refer her here to Dr. Kieth Brightly but the likelihood of a prompt appointment is not high.  Discussed some ways in the meantime that she can work on anxiety throughout the day.  Follow up withme in about2 months10minutes of face to face patient care time were spent during this visit. All questions were encouraged and answered.

## 2018-09-08 NOTE — Patient Instructions (Signed)
PLEASE FEEL FREE TO CALL OUR OFFICE WITH ANY PROBLEMS OR QUESTIONS ([phone])      

## 2018-10-19 ENCOUNTER — Other Ambulatory Visit: Payer: Self-pay | Admitting: Physical Medicine & Rehabilitation

## 2018-10-19 DIAGNOSIS — G894 Chronic pain syndrome: Secondary | ICD-10-CM

## 2018-10-19 DIAGNOSIS — G90512 Complex regional pain syndrome I of left upper limb: Secondary | ICD-10-CM

## 2018-10-23 ENCOUNTER — Other Ambulatory Visit: Payer: Self-pay | Admitting: Registered Nurse

## 2018-10-23 DIAGNOSIS — M47817 Spondylosis without myelopathy or radiculopathy, lumbosacral region: Secondary | ICD-10-CM

## 2018-10-23 DIAGNOSIS — M797 Fibromyalgia: Secondary | ICD-10-CM

## 2018-11-04 ENCOUNTER — Other Ambulatory Visit: Payer: Self-pay

## 2018-11-04 ENCOUNTER — Encounter: Payer: Medicare Other | Attending: Physical Medicine & Rehabilitation | Admitting: Physical Medicine & Rehabilitation

## 2018-11-04 ENCOUNTER — Encounter: Payer: Self-pay | Admitting: Physical Medicine & Rehabilitation

## 2018-11-04 DIAGNOSIS — Z90710 Acquired absence of both cervix and uterus: Secondary | ICD-10-CM | POA: Diagnosis not present

## 2018-11-04 DIAGNOSIS — Z79899 Other long term (current) drug therapy: Secondary | ICD-10-CM | POA: Diagnosis not present

## 2018-11-04 DIAGNOSIS — G894 Chronic pain syndrome: Secondary | ICD-10-CM

## 2018-11-04 DIAGNOSIS — E119 Type 2 diabetes mellitus without complications: Secondary | ICD-10-CM | POA: Diagnosis not present

## 2018-11-04 DIAGNOSIS — Z9049 Acquired absence of other specified parts of digestive tract: Secondary | ICD-10-CM | POA: Diagnosis not present

## 2018-11-04 DIAGNOSIS — Z809 Family history of malignant neoplasm, unspecified: Secondary | ICD-10-CM | POA: Diagnosis not present

## 2018-11-04 DIAGNOSIS — M5010 Cervical disc disorder with radiculopathy, unspecified cervical region: Secondary | ICD-10-CM | POA: Diagnosis not present

## 2018-11-04 DIAGNOSIS — K589 Irritable bowel syndrome without diarrhea: Secondary | ICD-10-CM | POA: Insufficient documentation

## 2018-11-04 DIAGNOSIS — M461 Sacroiliitis, not elsewhere classified: Secondary | ICD-10-CM | POA: Diagnosis not present

## 2018-11-04 DIAGNOSIS — M810 Age-related osteoporosis without current pathological fracture: Secondary | ICD-10-CM | POA: Insufficient documentation

## 2018-11-04 DIAGNOSIS — G9050 Complex regional pain syndrome I, unspecified: Secondary | ICD-10-CM | POA: Diagnosis not present

## 2018-11-04 DIAGNOSIS — Z9889 Other specified postprocedural states: Secondary | ICD-10-CM | POA: Insufficient documentation

## 2018-11-04 DIAGNOSIS — Z5181 Encounter for therapeutic drug level monitoring: Secondary | ICD-10-CM | POA: Diagnosis not present

## 2018-11-04 DIAGNOSIS — Z87891 Personal history of nicotine dependence: Secondary | ICD-10-CM | POA: Insufficient documentation

## 2018-11-04 DIAGNOSIS — M47817 Spondylosis without myelopathy or radiculopathy, lumbosacral region: Secondary | ICD-10-CM

## 2018-11-04 DIAGNOSIS — I10 Essential (primary) hypertension: Secondary | ICD-10-CM | POA: Diagnosis not present

## 2018-11-04 DIAGNOSIS — M609 Myositis, unspecified: Secondary | ICD-10-CM | POA: Insufficient documentation

## 2018-11-04 DIAGNOSIS — E785 Hyperlipidemia, unspecified: Secondary | ICD-10-CM | POA: Diagnosis not present

## 2018-11-04 DIAGNOSIS — M5416 Radiculopathy, lumbar region: Secondary | ICD-10-CM | POA: Diagnosis not present

## 2018-11-04 DIAGNOSIS — M797 Fibromyalgia: Secondary | ICD-10-CM | POA: Diagnosis not present

## 2018-11-04 MED ORDER — HYDROCODONE-ACETAMINOPHEN 7.5-325 MG PO TABS: 1.0000 | ORAL_TABLET | Freq: Four times a day (QID) | ORAL | 0 refills | Status: DC | PRN

## 2018-11-04 NOTE — Patient Instructions (Signed)
PLEASE FEEL FREE TO CALL OUR OFFICE WITH ANY PROBLEMS OR QUESTIONS ([phone])      

## 2018-11-04 NOTE — Progress Notes (Signed)
Subjective:    Patient ID: Shannon French, female    DOB: [birth year], 53 y.o.   MRN: [id number]  HPI   Shannon French is here in follow up of her chronic pain. She has been dealing with stress that the rest of [place] have been dealing with but has been managing reasonably well from a pain standpoint. She has been able to get outside more and is exercising with family.   She remains on hydrocodone 7.5/325  4 times daily in addition to her cymbalta, elavil, diclofenac.       Pain Inventory Average Pain 5 Pain Right Now 5 My pain is constant, sharp, burning, dull, stabbing, tingling and aching  In the last 24 hours, has pain interfered with the following? General activity 5 Relation with others 1 Enjoyment of life 5 What TIME of day is your pain at its worst? all Sleep (in general) Fair  Pain is worse with: walking, bending, sitting, inactivity, standing and some activites Pain improves with: rest, heat/ice, therapy/exercise, pacing activities and medication Relief from Meds: 5  Mobility walk without assistance how many minutes can you walk? 30 ability to climb steps?  yes do you drive?  yes  Function disabled: date disabled 2003 I need assistance with the following:  meal prep, household duties and shopping  Neuro/Psych weakness numbness tingling spasms anxiety  Prior Studies Any changes since last visit?  no  Physicians involved in your care Dr. Smith-Endocrinologist and Dr. Gillermina Hu   Family History  Problem Relation Age of Onset  . Cancer Father    Social History   Socioeconomic History  . Marital status: Single    Spouse name: Not on file  . Number of children: Not on file  . Years of education: Not on file  . Highest education level: Not on file  Occupational History  . Not on file  Social Needs  . Financial resource strain: Not on file  . Food insecurity:    Worry: Not on file    Inability: Not on file  . Transportation needs:    Medical:  Not on file    Non-medical: Not on file  Tobacco Use  . Smoking status: Former Smoker    Last attempt to quit: 10/14/2009    Years since quitting: 9.0  . Smokeless tobacco: Never Used  Substance and Sexual Activity  . Alcohol use: No  . Drug use: No  . Sexual activity: Not on file  Lifestyle  . Physical activity:    Days per week: Not on file    Minutes per session: Not on file  . Stress: Not on file  Relationships  . Social connections:    Talks on phone: Not on file    Gets together: Not on file    Attends religious service: Not on file    Active member of club or organization: Not on file    Attends meetings of clubs or organizations: Not on file    Relationship status: Not on file  Other Topics Concern  . Not on file  Social History Narrative  . Not on file   Past Surgical History:  Procedure Laterality Date  . ABDOMINAL HYSTERECTOMY    . ABDOMINAL SURGERY    . APPENDECTOMY    . CHOLECYSTECTOMY    . CYST EXCISION    . OVARY SURGERY     Past Medical History:  Diagnosis Date  . Arm pain    left  . Degenerative disc disease   . Diabetes  mellitus   . Fibromyalgia   . Hyperlipidemia   . Hypertension   . IBS (irritable bowel syndrome)   . Osteoporosis    BP 125/85   Pulse 79   Ht 5\' 3"  (1.6 m)   Wt 148 lb 12.8 oz (67.5 kg)   SpO2 96%   BMI 26.36 kg/m   Opioid Risk Score:   Fall Risk Score:  `1  Depression screen PHQ 2/9  [facility] 11/04/2018 09/08/2018 07/08/2018 07/22/2017 11/12/2016 08/12/2016 12/27/2015  Decreased Interest 0 0 0 0 0 0 0  Down, Depressed, Hopeless 0 0 0 0 0 0 0  PHQ - 2 Score 0 0 0 0 0 0 0  Altered sleeping - - - - - - -  Tired, decreased energy - - - - - - -  Change in appetite - - - - - - -  Feeling bad or failure about yourself  - - - - - - -  Trouble concentrating - - - - - - -  Moving slowly or fidgety/restless - - - - - - -  Suicidal thoughts - - - - - - -  PHQ-9 Score - - - - - - -     Review of Systems   Constitutional: Negative.   HENT: Negative.   Eyes: Negative.   Respiratory: Negative.   Cardiovascular: Negative.   Gastrointestinal: Negative.   Endocrine: Negative.   Genitourinary: Negative.   Musculoskeletal:       Spasms  Skin: Negative.   Allergic/Immunologic: Negative.   Neurological: Positive for weakness and numbness.       Tingling  Hematological: Negative.   Psychiatric/Behavioral: The patient is nervous/anxious.        Objective:   Physical Exam  General: No acute distress HEENT: EOMI, oral membranes moist Cards: reg rate  Chest: normal effort Abdomen: Soft, NT, ND Skin: dry, intact Extremities: no edema Neuro:Pt is cognitively appropriate with normal insight, memory, and awareness. Cranial nerves 2-12 are intact. Sensory exam is normal. Reflexes are 2+ in all 4's. Fine motor coordination is intact. No tremors. Motor function is grossly 5/5.  Musculoskeletal: left shoulder with limited but more funcitonal movement. Low back Tender with ROM. Psych: Patient is pleasant but became tearful at times today given her situation.     ASSESSMENT:  1. Bilateral sacroiliac joint dysfunction, status post radio  frequenciesablation2. Chronic pain syndrome consistent with fibromyalgia.  3. ?Right lumbar radiculopathy  3. Type 2 diabetes.  4. CRPS 1? LU E, Recent EMG/NCS --negative 5.Left adhesive capsulitis and labral tear---much improved with therapy and ongoing home exercise program.    PLAN:  1. Tizanidine for spasms and sleep. meloxicam prn for pain flares  2.Maintain home exercise program. Focus on appropriate mechanics and range of motion as we've discussed. She does a good job in general. .                  -Maintain weight management moving forward. She is doing a good job with her HEP.Marland Kitchen. 3. Hydrocodonewas refilled. -We will continue the controlled substance monitoring program, this consists of regular clinic visits, examinations,  routine drug screening, pill counts as well as use of [place] Controlled Sub[place] Reporting System. [place] was reviewed today.   -Medication was refilled and a second prescription was sent to the patient's pharmacy for next month.   4. Consider followupSGNB's at some point.no need currently -RF if symptoms dictate---still not indicated at present 5. Continue elavillat 50mg  qhs -  secretagogues prn for dry mouth -continue cymbalta at 90mg  -Continue Tegretol 200mg  bid 6.Continue diclofenac oral 75 mg twice daily #180 for 67-month fill.   Voltaren gel rx.   7.  Mental health.  counseling available if needed   Follow up withNP in about2 months55minutes of face to face patient care time were spent during this visit. All questions were encouraged and answered.

## 2019-01-11 ENCOUNTER — Encounter: Payer: Medicare Other | Attending: Physical Medicine & Rehabilitation | Admitting: Registered Nurse

## 2019-01-11 ENCOUNTER — Encounter: Payer: Self-pay | Admitting: Registered Nurse

## 2019-01-11 ENCOUNTER — Other Ambulatory Visit: Payer: Self-pay

## 2019-01-11 VITALS — BP 114/78 | HR 78 | Temp 97.3°F | Ht 63.0 in | Wt 144.4 lb

## 2019-01-11 DIAGNOSIS — M47817 Spondylosis without myelopathy or radiculopathy, lumbosacral region: Secondary | ICD-10-CM | POA: Diagnosis not present

## 2019-01-11 DIAGNOSIS — G9050 Complex regional pain syndrome I, unspecified: Secondary | ICD-10-CM | POA: Insufficient documentation

## 2019-01-11 DIAGNOSIS — E785 Hyperlipidemia, unspecified: Secondary | ICD-10-CM | POA: Insufficient documentation

## 2019-01-11 DIAGNOSIS — M461 Sacroiliitis, not elsewhere classified: Secondary | ICD-10-CM | POA: Insufficient documentation

## 2019-01-11 DIAGNOSIS — E119 Type 2 diabetes mellitus without complications: Secondary | ICD-10-CM | POA: Insufficient documentation

## 2019-01-11 DIAGNOSIS — M75102 Unspecified rotator cuff tear or rupture of left shoulder, not specified as traumatic: Secondary | ICD-10-CM

## 2019-01-11 DIAGNOSIS — Z90710 Acquired absence of both cervix and uterus: Secondary | ICD-10-CM | POA: Insufficient documentation

## 2019-01-11 DIAGNOSIS — M25512 Pain in left shoulder: Secondary | ICD-10-CM

## 2019-01-11 DIAGNOSIS — Z79899 Other long term (current) drug therapy: Secondary | ICD-10-CM

## 2019-01-11 DIAGNOSIS — M797 Fibromyalgia: Secondary | ICD-10-CM | POA: Insufficient documentation

## 2019-01-11 DIAGNOSIS — M810 Age-related osteoporosis without current pathological fracture: Secondary | ICD-10-CM | POA: Insufficient documentation

## 2019-01-11 DIAGNOSIS — G894 Chronic pain syndrome: Secondary | ICD-10-CM

## 2019-01-11 DIAGNOSIS — Z9049 Acquired absence of other specified parts of digestive tract: Secondary | ICD-10-CM | POA: Insufficient documentation

## 2019-01-11 DIAGNOSIS — I10 Essential (primary) hypertension: Secondary | ICD-10-CM | POA: Insufficient documentation

## 2019-01-11 DIAGNOSIS — M5010 Cervical disc disorder with radiculopathy, unspecified cervical region: Secondary | ICD-10-CM | POA: Insufficient documentation

## 2019-01-11 DIAGNOSIS — M546 Pain in thoracic spine: Secondary | ICD-10-CM | POA: Diagnosis not present

## 2019-01-11 DIAGNOSIS — M2550 Pain in unspecified joint: Secondary | ICD-10-CM

## 2019-01-11 DIAGNOSIS — M542 Cervicalgia: Secondary | ICD-10-CM | POA: Diagnosis not present

## 2019-01-11 DIAGNOSIS — K589 Irritable bowel syndrome without diarrhea: Secondary | ICD-10-CM | POA: Insufficient documentation

## 2019-01-11 DIAGNOSIS — G8929 Other chronic pain: Secondary | ICD-10-CM

## 2019-01-11 DIAGNOSIS — Z809 Family history of malignant neoplasm, unspecified: Secondary | ICD-10-CM | POA: Insufficient documentation

## 2019-01-11 DIAGNOSIS — Z87891 Personal history of nicotine dependence: Secondary | ICD-10-CM | POA: Insufficient documentation

## 2019-01-11 DIAGNOSIS — Z9889 Other specified postprocedural states: Secondary | ICD-10-CM | POA: Insufficient documentation

## 2019-01-11 DIAGNOSIS — M609 Myositis, unspecified: Secondary | ICD-10-CM | POA: Insufficient documentation

## 2019-01-11 DIAGNOSIS — Z5181 Encounter for therapeutic drug level monitoring: Secondary | ICD-10-CM

## 2019-01-11 DIAGNOSIS — M5416 Radiculopathy, lumbar region: Secondary | ICD-10-CM | POA: Insufficient documentation

## 2019-01-11 MED ORDER — DULOXETINE HCL 60 MG PO CPEP: ORAL_CAPSULE | ORAL | 1 refills | Status: DC

## 2019-01-11 MED ORDER — DULOXETINE HCL 30 MG PO CPEP: 30.0000 mg | ORAL_CAPSULE | Freq: Every day | ORAL | 0 refills | Status: DC

## 2019-01-11 MED ORDER — HYDROCODONE-ACETAMINOPHEN 7.5-325 MG PO TABS: 1.0000 | ORAL_TABLET | Freq: Four times a day (QID) | ORAL | 0 refills | Status: DC | PRN

## 2019-01-11 NOTE — Progress Notes (Signed)
Subjective:    Patient ID: Shannon French, female    DOB: 02/27/1966, 53 y.o.   MRN: [id number]  HPI: Shannon EaringSally French is a 53 y.o. female her appointment was changed, due to national recommendations of social distancing due to COVID 19, an audio/video telehealth visit is felt to be most appropriate for this patient at this time.  See Chart message from today for the patient's [facility]     She states her pain is located in her neck radiating into her left shoulder and mid- back pain. Also reports generalized joint pain. She rates her pain 6. Her current exercise regime is walking, performing stretching exercises and pool therapy.   [name] Morphine equivalent is 30.00  MME.  Last Oral Swab was Performed on 03/09/2018, it was consistent.   Barbee ShropshireBruce Bright CMA asked the Health and History Questions. This provider and Barbee ShropshireBruce Bright  verified we were speaking with the correct person using two identifiers.   Pain Inventory Average Pain 6 Pain Right Now 6 My pain is constant, dull and aching  In the last 24 hours, has pain interfered with the following? General activity 5 Relation with others 6 Enjoyment of life 6 What TIME of day is your pain at its worst? morning Sleep (in general) Fair  Pain is worse with: walking, bending, sitting, inactivity, standing and some activites Pain improves with: rest, heat/ice, therapy/exercise and medication Relief from Meds: 5  Mobility walk without assistance ability to climb steps?  yes do you drive?  yes  Function disabled: date disabled 2003 I need assistance with the following:  meal prep, household duties and shopping  Neuro/Psych weakness numbness tingling spasms anxiety  Prior Studies Any changes since last visit?  no  Physicians involved in your care Any changes since last visit?  no   Family History  Problem Relation Age of Onset  . Cancer Father    Social History    Socioeconomic History  . Marital status: Single    Spouse name: Not on file  . Number of children: Not on file  . Years of education: Not on file  . Highest education level: Not on file  Occupational History  . Not on file  Social Needs  . Financial resource strain: Not on file  . Food insecurity:    Worry: Not on file    Inability: Not on file  . Transportation needs:    Medical: Not on file    Non-medical: Not on file  Tobacco Use  . Smoking status: Former Smoker    Last attempt to quit: 10/14/2009    Years since quitting: 9.2  . Smokeless tobacco: Never Used  Substance and Sexual Activity  . Alcohol use: No  . Drug use: No  . Sexual activity: Not on file  Lifestyle  . Physical activity:    Days per week: Not on file    Minutes per session: Not on file  . Stress: Not on file  Relationships  . Social connections:    Talks on phone: Not on file    Gets together: Not on file    Attends religious service: Not on file    Active member of club or organization: Not on file    Attends meetings of clubs or organizations: Not on file    Relationship status: Not on file  Other Topics Concern  . Not on file  Social History Narrative  . Not on file  Past Surgical History:  Procedure Laterality Date  . ABDOMINAL HYSTERECTOMY    . ABDOMINAL SURGERY    . APPENDECTOMY    . CHOLECYSTECTOMY    . CYST EXCISION    . OVARY SURGERY     Past Medical History:  Diagnosis Date  . Arm pain    left  . Degenerative disc disease   . Diabetes mellitus   . Fibromyalgia   . Hyperlipidemia   . Hypertension   . IBS (irritable bowel syndrome)   . Osteoporosis    BP 114/78   Pulse 78   Temp (!) 97.3 F (36.3 C)   Ht 5\' 3"  (1.6 m)   Wt 144 lb 6.4 oz (65.5 kg)   SpO2 97%   BMI 25.58 kg/m   Opioid Risk Score:   Fall Risk Score:  `1  Depression screen PHQ 2/9  Dep[facility] screen [facility] 11/04/2018 09/08/2018 07/08/2018 07/22/2017 11/12/2016 08/12/2016 12/27/2015  Decreased Interest  0 0 0 0 0 0 0  Down, Depressed, Hopeless 0 0 0 0 0 0 0  PHQ - 2 Score 0 0 0 0 0 0 0  Altered sleeping - - - - - - -  Tired, decreased energy - - - - - - -  Change in appetite - - - - - - -  Feeling bad or failure about yourself  - - - - - - -  Trouble concentrating - - - - - - -  Moving slowly or fidgety/restless - - - - - - -  Suicidal thoughts - - - - - - -  PHQ-9 Score - - - - - - -     Review of Systems  Constitutional: Negative.   HENT: Negative.   Eyes: Negative.   Respiratory: Negative.   Cardiovascular: Negative.   Gastrointestinal: Negative.   Endocrine: Negative.   Genitourinary: Negative.   Musculoskeletal: Positive for arthralgias, back pain and myalgias.  Skin: Negative.   Allergic/Immunologic: Negative.   Neurological: Positive for weakness and numbness.  Hematological: Negative.   Psychiatric/Behavioral: The patient is nervous/anxious.   All other systems reviewed and are negative.      Objective:   Physical Exam Vitals signs and nursing note reviewed.  Musculoskeletal:     Comments: No Physical Exam Performed: Virtual Visit  Neurological:     Mental Status: She is oriented to person, place, and time.           Assessment & Plan:  1. Bilateral sacroiliac joint dysfunction: 01/11/2019 Refilled:HYDROcodone 7.5/325mg  one tablet every 6 hrs as needed # 120.  We will continue the opioid monitoring program, this consists of regular clinic visits, examinations, urine drug screen pill counts as well as use of [place] Controlled Substance reporting System. 2. Chronic pain syndrome consistent with fibromyalgia. Continue with exercise regime, heat therapy,voltaren gel, lidocaine patches and topamax. Continue current analgesics . 01/11/2019 3. Lumbosacral Spondylosis/ Bilaterallumbar radiculopathy:Continue Elavil and Cymbalta. 01/11/2019 4. Complex Regional Pain Syndrome: Continue Cymbalta. 01/11/2019 Dr. Yolanda BonineBertrand Following  5. Type 2 diabetes.:  PCP Following. 01/11/2019 6. Muscle Spasms: Continue Tizanidine 4 mg one tablet every 8 hours as needed for muscle spasms. 01/11/2019 7. Insomnia: Continue Elavil. 01/11/2019 8.Cervical Radiculopathy: Continue Elavil: 01/11/2019 9. Cervicalgia/ Cervical Inflammation: No complaints today. Continue current medication regime. Alternate with heat and Ice Therapy. 01/11/2019 10. Bilateral Thoracic Pain: Continue current medication regime. Continue to Monitor. 08/25/2017 11. Left Shoulder Pain: Continue with HEP as Tolerated. Continue to Alternate Ice and Heat. Continue to  monitor. 01/11/2019.   F/U in 1 month  Telephone Call  Location of patient: In Her Home Location of provider: Office Established patient Time spent on call:10 Minutes

## 2019-01-16 ENCOUNTER — Other Ambulatory Visit: Payer: Self-pay | Admitting: Physical Medicine & Rehabilitation

## 2019-01-16 DIAGNOSIS — M542 Cervicalgia: Secondary | ICD-10-CM

## 2019-01-16 DIAGNOSIS — G8929 Other chronic pain: Secondary | ICD-10-CM

## 2019-01-16 DIAGNOSIS — G90512 Complex regional pain syndrome I of left upper limb: Secondary | ICD-10-CM

## 2019-01-16 DIAGNOSIS — Z5181 Encounter for therapeutic drug level monitoring: Secondary | ICD-10-CM

## 2019-01-16 DIAGNOSIS — Z79899 Other long term (current) drug therapy: Secondary | ICD-10-CM

## 2019-01-16 DIAGNOSIS — M47817 Spondylosis without myelopathy or radiculopathy, lumbosacral region: Secondary | ICD-10-CM

## 2019-01-16 DIAGNOSIS — M25512 Pain in left shoulder: Secondary | ICD-10-CM

## 2019-01-16 DIAGNOSIS — G894 Chronic pain syndrome: Secondary | ICD-10-CM

## 2019-03-15 ENCOUNTER — Encounter: Payer: Medicare Other | Attending: Physical Medicine & Rehabilitation | Admitting: Registered Nurse

## 2019-03-15 ENCOUNTER — Encounter: Payer: Self-pay | Admitting: Registered Nurse

## 2019-03-15 ENCOUNTER — Other Ambulatory Visit: Payer: Self-pay

## 2019-03-15 VITALS — BP 117/78 | HR 79 | Temp 97.5°F | Resp 18 | Ht 63.0 in | Wt 143.0 lb

## 2019-03-15 DIAGNOSIS — Z79891 Long term (current) use of opiate analgesic: Secondary | ICD-10-CM

## 2019-03-15 DIAGNOSIS — Z9049 Acquired absence of other specified parts of digestive tract: Secondary | ICD-10-CM | POA: Insufficient documentation

## 2019-03-15 DIAGNOSIS — E785 Hyperlipidemia, unspecified: Secondary | ICD-10-CM | POA: Diagnosis not present

## 2019-03-15 DIAGNOSIS — M5010 Cervical disc disorder with radiculopathy, unspecified cervical region: Secondary | ICD-10-CM | POA: Insufficient documentation

## 2019-03-15 DIAGNOSIS — K589 Irritable bowel syndrome without diarrhea: Secondary | ICD-10-CM | POA: Insufficient documentation

## 2019-03-15 DIAGNOSIS — I10 Essential (primary) hypertension: Secondary | ICD-10-CM | POA: Diagnosis not present

## 2019-03-15 DIAGNOSIS — Z87891 Personal history of nicotine dependence: Secondary | ICD-10-CM | POA: Diagnosis not present

## 2019-03-15 DIAGNOSIS — M542 Cervicalgia: Secondary | ICD-10-CM | POA: Diagnosis not present

## 2019-03-15 DIAGNOSIS — E119 Type 2 diabetes mellitus without complications: Secondary | ICD-10-CM | POA: Diagnosis not present

## 2019-03-15 DIAGNOSIS — M797 Fibromyalgia: Secondary | ICD-10-CM | POA: Diagnosis not present

## 2019-03-15 DIAGNOSIS — M461 Sacroiliitis, not elsewhere classified: Secondary | ICD-10-CM | POA: Insufficient documentation

## 2019-03-15 DIAGNOSIS — Z79899 Other long term (current) drug therapy: Secondary | ICD-10-CM | POA: Diagnosis not present

## 2019-03-15 DIAGNOSIS — M5416 Radiculopathy, lumbar region: Secondary | ICD-10-CM | POA: Diagnosis not present

## 2019-03-15 DIAGNOSIS — M2550 Pain in unspecified joint: Secondary | ICD-10-CM

## 2019-03-15 DIAGNOSIS — Z9889 Other specified postprocedural states: Secondary | ICD-10-CM | POA: Insufficient documentation

## 2019-03-15 DIAGNOSIS — Z809 Family history of malignant neoplasm, unspecified: Secondary | ICD-10-CM | POA: Insufficient documentation

## 2019-03-15 DIAGNOSIS — Z90710 Acquired absence of both cervix and uterus: Secondary | ICD-10-CM | POA: Insufficient documentation

## 2019-03-15 DIAGNOSIS — M810 Age-related osteoporosis without current pathological fracture: Secondary | ICD-10-CM | POA: Insufficient documentation

## 2019-03-15 DIAGNOSIS — G894 Chronic pain syndrome: Secondary | ICD-10-CM | POA: Diagnosis present

## 2019-03-15 DIAGNOSIS — M75102 Unspecified rotator cuff tear or rupture of left shoulder, not specified as traumatic: Secondary | ICD-10-CM

## 2019-03-15 DIAGNOSIS — M25512 Pain in left shoulder: Secondary | ICD-10-CM

## 2019-03-15 DIAGNOSIS — Z5181 Encounter for therapeutic drug level monitoring: Secondary | ICD-10-CM | POA: Insufficient documentation

## 2019-03-15 DIAGNOSIS — M546 Pain in thoracic spine: Secondary | ICD-10-CM

## 2019-03-15 DIAGNOSIS — G9050 Complex regional pain syndrome I, unspecified: Secondary | ICD-10-CM | POA: Diagnosis not present

## 2019-03-15 DIAGNOSIS — G8929 Other chronic pain: Secondary | ICD-10-CM

## 2019-03-15 DIAGNOSIS — M47817 Spondylosis without myelopathy or radiculopathy, lumbosacral region: Secondary | ICD-10-CM | POA: Diagnosis not present

## 2019-03-15 DIAGNOSIS — M609 Myositis, unspecified: Secondary | ICD-10-CM | POA: Diagnosis not present

## 2019-03-15 DIAGNOSIS — G90512 Complex regional pain syndrome I of left upper limb: Secondary | ICD-10-CM

## 2019-03-15 MED ORDER — HYDROCODONE-ACETAMINOPHEN 7.5-325 MG PO TABS: 1.0000 | ORAL_TABLET | Freq: Four times a day (QID) | ORAL | 0 refills | Status: DC | PRN

## 2019-03-15 NOTE — Progress Notes (Signed)
Subjective:    Patient ID: Shannon French, female    DOB: [birth year], 53 y.o.   MRN: [id number]  HPI: Shannon French is a 53 y.o. female who returns for follow up appointment for chronic pain and medication refill. She states her pain is located in her neck,  left shoulder. Mid- back and reports generalized joint pain.. She rates her pain 4. Her  current exercise regime is walking, performing stretching exercises with bands and swimming.   Ms. Corliss Morphine equivalent is 30.00  MME.  Oral Swab was Performed today.   Pain Inventory Average Pain 4 Pain Right Now 4 My pain is constant, sharp, burning, dull, stabbing, tingling and aching  In the last 24 hours, has pain interfered with the following? General activity 4 Relation with others 5 Enjoyment of life 6 What TIME of day is your pain at its worst? all Sleep (in general) Fair  Pain is worse with: walking, bending, sitting, inactivity, standing and some activites Pain improves with: rest, heat/ice, therapy/exercise, pacing activities and medication Relief from Meds: 5  Mobility walk without assistance how many minutes can you walk? 45 ability to climb steps?  yes do you drive?  yes  Function disabled: date disabled 2003 I need assistance with the following:  meal prep and household duties Do you have any goals in this area?  yes  Neuro/Psych weakness numbness tingling spasms anxiety  Prior Studies Any changes since last visit?  no  Physicians involved in your care Any changes since last visit?  no   Family History  Problem Relation Age of Onset  . Cancer Father    Social History   Socioeconomic History  . Marital status: Single    Spouse name: Not on file  . Number of children: Not on file  . Years of education: Not on file  . Highest education level: Not on file  Occupational History  . Not on file  Social Needs  . Financial resource strain: Not on file  . Food insecurity    Worry: Not on file   Inability: Not on file  . Transportation needs    Medical: Not on file    Non-medical: Not on file  Tobacco Use  . Smoking status: Former Smoker    Quit date: 10/14/2009    Years since quitting: 9.4  . Smokeless tobacco: Never Used  Substance and Sexual Activity  . Alcohol use: No  . Drug use: No  . Sexual activity: Not on file  Lifestyle  . Physical activity    Days per week: Not on file    Minutes per session: Not on file  . Stress: Not on file  Relationships  . Social Herbalist on phone: Not on file    Gets together: Not on file    Attends religious service: Not on file    Active member of club or organization: Not on file    Attends meetings of clubs or organizations: Not on file    Relationship status: Not on file  Other Topics Concern  . Not on file  Social History Narrative  . Not on file   Past Surgical History:  Procedure Laterality Date  . ABDOMINAL HYSTERECTOMY    . ABDOMINAL SURGERY    . APPENDECTOMY    . CHOLECYSTECTOMY    . CYST EXCISION    . OVARY SURGERY     Past Medical History:  Diagnosis Date  . Arm pain    left  .  Degenerative disc disease   . Diabetes mellitus   . Fibromyalgia   . Hyperlipidemia   . Hypertension   . IBS (irritable bowel syndrome)   . Osteoporosis    There were no vitals taken for this visit.  Opioid Risk Score:   Fall Risk Score:  `1  Depression screen PHQ 2/9  [facility] 11/04/2018 09/08/2018 07/08/2018 07/22/2017 11/12/2016 08/12/2016 12/27/2015  Decreased Interest 0 0 0 0 0 0 0  Down, Depressed, Hopeless 0 0 0 0 0 0 0  PHQ - 2 Score 0 0 0 0 0 0 0  Altered sleeping - - - - - - -  Tired, decreased energy - - - - - - -  Change in appetite - - - - - - -  Feeling bad or failure about yourself  - - - - - - -  Trouble concentrating - - - - - - -  Moving slowly or fidgety/restless - - - - - - -  Suicidal thoughts - - - - - - -  PHQ-9 Score - - - - - - -     Review of Systems  Constitutional:  Negative.   HENT: Negative.   Eyes: Negative.   Respiratory: Negative.   Cardiovascular: Negative.   Gastrointestinal: Negative.   Endocrine: Negative.   Genitourinary: Negative.   Musculoskeletal: Positive for arthralgias, back pain, myalgias, neck pain and neck stiffness.  Skin: Negative.   Allergic/Immunologic: Negative.   Neurological: Positive for weakness and numbness.  Psychiatric/Behavioral: The patient is nervous/anxious.   All other systems reviewed and are negative.      Objective:   Physical Exam Vitals signs and nursing note reviewed.  Constitutional:      Appearance: Normal appearance.  Neck:     Musculoskeletal: Normal range of motion and neck supple.     Comments: Cervical Paraspinal Tenderness: C-5-C-6 Cardiovascular:     Rate and Rhythm: Normal rate and regular rhythm.     Pulses: Normal pulses.     Heart sounds: Normal heart sounds.  Pulmonary:     Effort: Pulmonary effort is normal.     Breath sounds: Normal breath sounds.  Musculoskeletal:     Comments: Normal Muscle Bulk and Muscle Testing Reveals:  Upper Extremities: Full ROM and Muscle Strength 5/5 Bilateral AC Joint Tenderness L>R  Thoracic Paraspinal Tenderness: T-7-T-9 Lumbar Paraspinal Tenderness: L-4-L-5 Lower Extremities: Full ROM  And Muscle Strength 5/5 Arises from Table with ease Narrow Based  Gait   Skin:    General: Skin is warm and dry.  Neurological:     Mental Status: She is alert and oriented to person, place, and time.  Psychiatric:        Mood and Affect: Mood normal.        Behavior: Behavior normal.           Assessment & Plan:  1. Bilateral sacroiliac joint dysfunction:03/15/2019 Refilled:HYDROcodone 7.5/325mg  one tablet every 6 hrs as needed # 120. Second script sent for the following month. We will continue the opioid monitoring program, this consists of regular clinic visits, examinations, urine drug screen pill counts as well as use of [place]  Cont[place] Sub[place] System. 2. Chronic pain syndrome consistent with fibromyalgia. Continue with exercise regime, heat therapy,voltaren gel, lidocaine patches and topamax. Continue current analgesics .03/15/2019 3. Lumbosacral Spondylosis/ Bilaterallumbar radiculopathy:Continue Elavil and Cymbalta.03/15/2019 4. Complex Regional Pain Syndrome: Continue Cymbalta.03/15/2019 Dr. Yolanda BonineBertrand Following  5. Type 2 diabetes.: PCP Following.03/15/2019 6. Muscle Spasms:  Continue Tizanidine 4 mg one tablet every 8 hours as needed for muscle spasms.03/15/2019 7. Insomnia: Continue Elavil.03/15/2019 8.Cervical Radiculopathy: Continue Elavil:03/15/2019 9. Cervicalgia/ Cervical Inflammation:Continue current medication regime. Alternate with heat and Ice Therapy.03/15/2019 10. Bilateral Thoracic Pain: Continue current medication regime. Continue to Monitor.03/15/2019 11. Left Shoulder Pain:Continue with HEP as Tolerated. Continue to Alternate Ice and Heat. Continue to monitor. 03/15/2019.   15  minutes of face to face patient care time was spent during this visit. All questions were encouraged and answered. F/U in 1 month

## 2019-03-20 LAB — DRUG TOX MONITOR 1 W/CONF, ORAL FLD
Amphetamines: NEGATIVE ng/mL (ref <10)
Barbiturates: NEGATIVE ng/mL (ref <10)
Benzodiazepines: NEGATIVE ng/mL (ref <0.50)
Buprenorphine: NEGATIVE ng/mL (ref <0.10)
Cocaine: NEGATIVE ng/mL (ref <5.0)
Codeine: NEGATIVE ng/mL (ref <2.5)
Cotinine: 105 ng/mL — ABNORMAL HIGH (ref <5.0)
Dihydrocodeine: 5.5 ng/mL — ABNORMAL HIGH (ref <2.5)
Fentanyl: NEGATIVE ng/mL (ref <0.10)
Heroin Metabolite: NEGATIVE ng/mL (ref <1.0)
Hydrocodone: 73.1 ng/mL — ABNORMAL HIGH (ref <2.5)
Hydromorphone: NEGATIVE ng/mL (ref <2.5)
MARIJUANA: NEGATIVE ng/mL (ref <2.5)
MDMA: NEGATIVE ng/mL (ref <10)
Meprobamate: NEGATIVE ng/mL (ref <2.5)
Methadone: NEGATIVE ng/mL (ref <5.0)
Morphine: NEGATIVE ng/mL (ref <2.5)
Nicotine Metabolite: POSITIVE ng/mL — AB (ref <5.0)
Norhydrocodone: 9 ng/mL — ABNORMAL HIGH (ref <2.5)
Noroxycodone: NEGATIVE ng/mL (ref <2.5)
Opiates: POSITIVE ng/mL — AB (ref <2.5)
Oxycodone: NEGATIVE ng/mL (ref <2.5)
Oxymorphone: NEGATIVE ng/mL (ref <2.5)
Phencyclidine: NEGATIVE ng/mL (ref <10)
Tapentadol: NEGATIVE ng/mL (ref <5.0)
Tramadol: NEGATIVE ng/mL (ref <5.0)
Zolpidem: NEGATIVE ng/mL (ref <5.0)

## 2019-03-20 LAB — DRUG TOX ALC METAB W/CON, ORAL FLD: Alcohol Metabolite: NEGATIVE ng/mL (ref <25)

## 2019-03-25 ENCOUNTER — Telehealth: Payer: Self-pay | Admitting: *Deleted

## 2019-03-25 NOTE — Telephone Encounter (Signed)
Oral swab drug screen was consistent for prescribed medications.  ?

## 2019-04-14 ENCOUNTER — Other Ambulatory Visit: Payer: Self-pay | Admitting: Registered Nurse

## 2019-04-15 ENCOUNTER — Other Ambulatory Visit: Payer: Self-pay | Admitting: Physical Medicine & Rehabilitation

## 2019-04-15 DIAGNOSIS — Z79899 Other long term (current) drug therapy: Secondary | ICD-10-CM

## 2019-04-15 DIAGNOSIS — G90512 Complex regional pain syndrome I of left upper limb: Secondary | ICD-10-CM

## 2019-04-15 DIAGNOSIS — G894 Chronic pain syndrome: Secondary | ICD-10-CM

## 2019-04-15 DIAGNOSIS — M47817 Spondylosis without myelopathy or radiculopathy, lumbosacral region: Secondary | ICD-10-CM

## 2019-04-15 DIAGNOSIS — M797 Fibromyalgia: Secondary | ICD-10-CM

## 2019-04-15 DIAGNOSIS — G8929 Other chronic pain: Secondary | ICD-10-CM

## 2019-04-15 DIAGNOSIS — M542 Cervicalgia: Secondary | ICD-10-CM

## 2019-04-15 DIAGNOSIS — Z5181 Encounter for therapeutic drug level monitoring: Secondary | ICD-10-CM

## 2019-05-27 ENCOUNTER — Encounter: Payer: Medicare Other | Admitting: Registered Nurse

## 2019-06-01 ENCOUNTER — Encounter: Payer: Medicare Other | Attending: Physical Medicine & Rehabilitation | Admitting: Registered Nurse

## 2019-06-01 ENCOUNTER — Other Ambulatory Visit: Payer: Self-pay

## 2019-06-01 VITALS — BP 118/76 | HR 85 | Temp 96.6°F | Ht 63.0 in | Wt 128.0 lb

## 2019-06-01 DIAGNOSIS — Z90710 Acquired absence of both cervix and uterus: Secondary | ICD-10-CM | POA: Insufficient documentation

## 2019-06-01 DIAGNOSIS — E785 Hyperlipidemia, unspecified: Secondary | ICD-10-CM | POA: Insufficient documentation

## 2019-06-01 DIAGNOSIS — M5010 Cervical disc disorder with radiculopathy, unspecified cervical region: Secondary | ICD-10-CM | POA: Insufficient documentation

## 2019-06-01 DIAGNOSIS — M542 Cervicalgia: Secondary | ICD-10-CM | POA: Diagnosis not present

## 2019-06-01 DIAGNOSIS — Z79899 Other long term (current) drug therapy: Secondary | ICD-10-CM | POA: Insufficient documentation

## 2019-06-01 DIAGNOSIS — M75102 Unspecified rotator cuff tear or rupture of left shoulder, not specified as traumatic: Secondary | ICD-10-CM | POA: Diagnosis not present

## 2019-06-01 DIAGNOSIS — Z9889 Other specified postprocedural states: Secondary | ICD-10-CM | POA: Insufficient documentation

## 2019-06-01 DIAGNOSIS — Z9049 Acquired absence of other specified parts of digestive tract: Secondary | ICD-10-CM | POA: Insufficient documentation

## 2019-06-01 DIAGNOSIS — I10 Essential (primary) hypertension: Secondary | ICD-10-CM | POA: Insufficient documentation

## 2019-06-01 DIAGNOSIS — Z5181 Encounter for therapeutic drug level monitoring: Secondary | ICD-10-CM | POA: Insufficient documentation

## 2019-06-01 DIAGNOSIS — G9050 Complex regional pain syndrome I, unspecified: Secondary | ICD-10-CM | POA: Insufficient documentation

## 2019-06-01 DIAGNOSIS — M546 Pain in thoracic spine: Secondary | ICD-10-CM

## 2019-06-01 DIAGNOSIS — M5416 Radiculopathy, lumbar region: Secondary | ICD-10-CM | POA: Insufficient documentation

## 2019-06-01 DIAGNOSIS — E119 Type 2 diabetes mellitus without complications: Secondary | ICD-10-CM | POA: Insufficient documentation

## 2019-06-01 DIAGNOSIS — K589 Irritable bowel syndrome without diarrhea: Secondary | ICD-10-CM | POA: Insufficient documentation

## 2019-06-01 DIAGNOSIS — M2550 Pain in unspecified joint: Secondary | ICD-10-CM

## 2019-06-01 DIAGNOSIS — Z87891 Personal history of nicotine dependence: Secondary | ICD-10-CM | POA: Insufficient documentation

## 2019-06-01 DIAGNOSIS — M810 Age-related osteoporosis without current pathological fracture: Secondary | ICD-10-CM | POA: Insufficient documentation

## 2019-06-01 DIAGNOSIS — M461 Sacroiliitis, not elsewhere classified: Secondary | ICD-10-CM | POA: Insufficient documentation

## 2019-06-01 DIAGNOSIS — M47817 Spondylosis without myelopathy or radiculopathy, lumbosacral region: Secondary | ICD-10-CM

## 2019-06-01 DIAGNOSIS — G8929 Other chronic pain: Secondary | ICD-10-CM

## 2019-06-01 DIAGNOSIS — G90512 Complex regional pain syndrome I of left upper limb: Secondary | ICD-10-CM

## 2019-06-01 DIAGNOSIS — M25512 Pain in left shoulder: Secondary | ICD-10-CM

## 2019-06-01 DIAGNOSIS — G609 Hereditary and idiopathic neuropathy, unspecified: Secondary | ICD-10-CM

## 2019-06-01 DIAGNOSIS — Z809 Family history of malignant neoplasm, unspecified: Secondary | ICD-10-CM | POA: Insufficient documentation

## 2019-06-01 DIAGNOSIS — M797 Fibromyalgia: Secondary | ICD-10-CM | POA: Insufficient documentation

## 2019-06-01 DIAGNOSIS — M609 Myositis, unspecified: Secondary | ICD-10-CM | POA: Insufficient documentation

## 2019-06-01 DIAGNOSIS — G894 Chronic pain syndrome: Secondary | ICD-10-CM | POA: Insufficient documentation

## 2019-06-01 MED ORDER — HYDROCODONE-ACETAMINOPHEN 7.5-325 MG PO TABS: 1.0000 | ORAL_TABLET | Freq: Four times a day (QID) | ORAL | 0 refills | Status: DC | PRN

## 2019-06-01 NOTE — Progress Notes (Signed)
Subjective:    Patient ID: Shannon French, female    DOB: 06/16/1966, 53 y.o.   MRN: [id number]  HPI: Shannon French is a 53 y.o. female whose was changed to a telephone visit, she called stating she was having N/V and diarrhea. Also reports she had two COVID-19 test performed and they were negative. Her appointment was changed to a tele-health visit and she agreed to tele health visit.  She states her pain is located in her neck radiating into her left shoulder, mid-back pain, lower extremity pain with tingling and burning radiating into her bilateral feet. Also reports generalized joint pain. She rates her  pain 3. Her current exercise regime is walking and performing stretching exercises.  [name] Morphine equivalent is 30.00 MME. Last Oral Swab was Performed on 03/15/2019, it was consistent.  Health and History Questions were performed by Silas SacramentoLakeesha Lee CMA, this provider and Angelica ChessmanKeesha Lee verified we were speaking with the correct person using two identifiers.   Pain Inventory Average Pain 4 Pain Right Now 3 My pain is sharp, burning, dull, stabbing, tingling and aching  In the last 24 hours, has pain interfered with the following? General activity 3 Relation with others 3 Enjoyment of life 3 What TIME of day is your pain at its worst? all Sleep (in general) Fair  Pain is worse with: walking, bending, sitting, inactivity, standing, unsure and some activites Pain improves with: rest and medication Relief from Meds: 6  Mobility walk with assistance use a cane how many minutes can you walk? 10 ability to climb steps?  yes do you drive?  yes  Function disabled: date disabled 2003 I need assistance with the following:  meal prep, household duties and shopping  Neuro/Psych bowel control problems weakness numbness tingling trouble walking spasms anxiety  Prior Studies Any changes since last visit?  no Coivid test  Physicians involved in your care Primary care .    Family History  Problem Relation Age of Onset  . Cancer Father    Social History   Socioeconomic History  . Marital status: Single    Spouse name: Not on file  . Number of children: Not on file  . Years of education: Not on file  . Highest education level: Not on file  Occupational History  . Not on file  Social Needs  . Financial resource strain: Not on file  . Food insecurity    Worry: Not on file    Inability: Not on file  . Transportation needs    Medical: Not on file    Non-medical: Not on file  Tobacco Use  . Smoking status: Former Smoker    Quit date: 10/14/2009    Years since quitting: 9.6  . Smokeless tobacco: Never Used  Substance and Sexual Activity  . Alcohol use: No  . Drug use: No  . Sexual activity: Not on file  Lifestyle  . Physical activity    Days per week: Not on file    Minutes per session: Not on file  . Stress: Not on file  Relationships  . Social Musicianconnections    Talks on phone: Not on file    Gets together: Not on file    Attends religious service: Not on file    Active member of club or organization: Not on file    Attends meetings of clubs or organizations: Not on file    Relationship status: Not on file  Other Topics Concern  . Not on file  Social  History Narrative  . Not on file   Past Surgical History:  Procedure Laterality Date  . ABDOMINAL HYSTERECTOMY    . ABDOMINAL SURGERY    . APPENDECTOMY    . CHOLECYSTECTOMY    . CYST EXCISION    . OVARY SURGERY     Past Medical History:  Diagnosis Date  . Arm pain    left  . Degenerative disc disease   . Diabetes mellitus   . Fibromyalgia   . Hyperlipidemia   . Hypertension   . IBS (irritable bowel syndrome)   . Osteoporosis    BP 118/76 Comment: pt reported, phone visit  Pulse 85 Comment: pt reported, phone visit  Temp (!) 96.6 F (35.9 C) Comment: pt reported, phone visit  Ht 5\' 3"  (1.6 m) Comment: pt reported, phone visit  Wt 128 lb (58.1 kg) Comment: pt reported, phone  visit  SpO2 97% Comment: pt reported, phone visit  BMI 22.67 kg/m   Opioid Risk Score:   Fall Risk Score:  `1  Depression screen PHQ 2/9  Depression screen [facility] 2/9 06/01/2019 11/04/2018 09/08/2018 07/08/2018 07/22/2017 11/12/2016 08/12/2016  Decreased Interest 0 0 0 0 0 0 0  Down, Depressed, Hopeless - 0 0 0 0 0 0  PHQ - 2 Score 0 0 0 0 0 0 0  Altered sleeping - - - - - - -  Tired, decreased energy - - - - - - -  Change in appetite - - - - - - -  Feeling bad or failure about yourself  - - - - - - -  Trouble concentrating - - - - - - -  Moving slowly or fidgety/restless - - - - - - -  Suicidal thoughts - - - - - - -  PHQ-9 Score - - - - - - -    Review of Systems  Constitutional: Positive for appetite change, chills, fatigue, fever and unexpected weight change.  Respiratory: Positive for cough.   Gastrointestinal: Positive for diarrhea, nausea and vomiting.  Musculoskeletal: Positive for back pain.  Neurological: Positive for weakness.  All other systems reviewed and are negative.      Objective:   Physical Exam Vitals signs and nursing note reviewed.  Musculoskeletal:     Comments: No Physical Exam Performed: Virtual Visit  Neurological:     Mental Status: She is oriented to person, place, and time.  Psychiatric:        Mood and Affect: Mood normal.        Behavior: Behavior normal.           Assessment & Plan:  1. Bilateral sacroiliac joint dysfunction:06/01/2019 Refilled:HYDROcodone 7.5/325mg  one tablet every 6 hrs as needed # 120. Second script sent for the following month. We will continue the opioid monitoring program, this consists of regular clinic visits, examinations, urine drug screen pill counts as well as use of 06/03/2019 Controlled Substance reporting System. 2. Chronic pain syndrome consistent with fibromyalgia. Continue with exercise regime, heat therapy,voltaren gel, lidocaine patches and topamax. Continue current analgesics .06/01/2019 3.  Lumbosacral Spondylosis/ Bilaterallumbar radiculopathy:Continue Elavil and Cymbalta.06/01/2019 4. Complex Regional Pain Syndrome: Continue Cymbalta.06/01/2019 Dr. 06/03/2019 Following  5. Type 2 diabetes.: PCPFollowing.06/01/2019 6. Muscle Spasms: Continue Tizanidine 4 mg one tablet every 8 hours as needed for muscle spasms.06/01/2019 7. Insomnia: Continue Elavil.06/01/2019 8.Cervical Radiculopathy: Continue Elavil:06/01/2019 9. Cervicalgia/ Cervical Inflammation:Continue current medication regime. Alternate with heat and Ice Therapy.06/01/2019 10. Bilateral Thoracic Pain: Continue current medication regime. Continue to Monitor.06/01/2019 11.  Left Shoulder Pain:Continue with HEP as Tolerated. Continue to Alternate Ice and Heat. Continue to monitor. 06/01/2019.  Tele-health Visit:  Location: Her Home Time Spent: 10 minutes

## 2019-06-02 ENCOUNTER — Encounter: Payer: Self-pay | Admitting: Registered Nurse

## 2019-07-08 IMAGING — DX DG CERVICAL SPINE 2 OR 3 VIEWS
2 series · 2 of 2 positions shown · non-contrast
Comparison: 12/27/2015

CLINICAL DATA: Cervicalgia

EXAM:
CERVICAL SPINE - 2-3 VIEW

[c-spine lat]
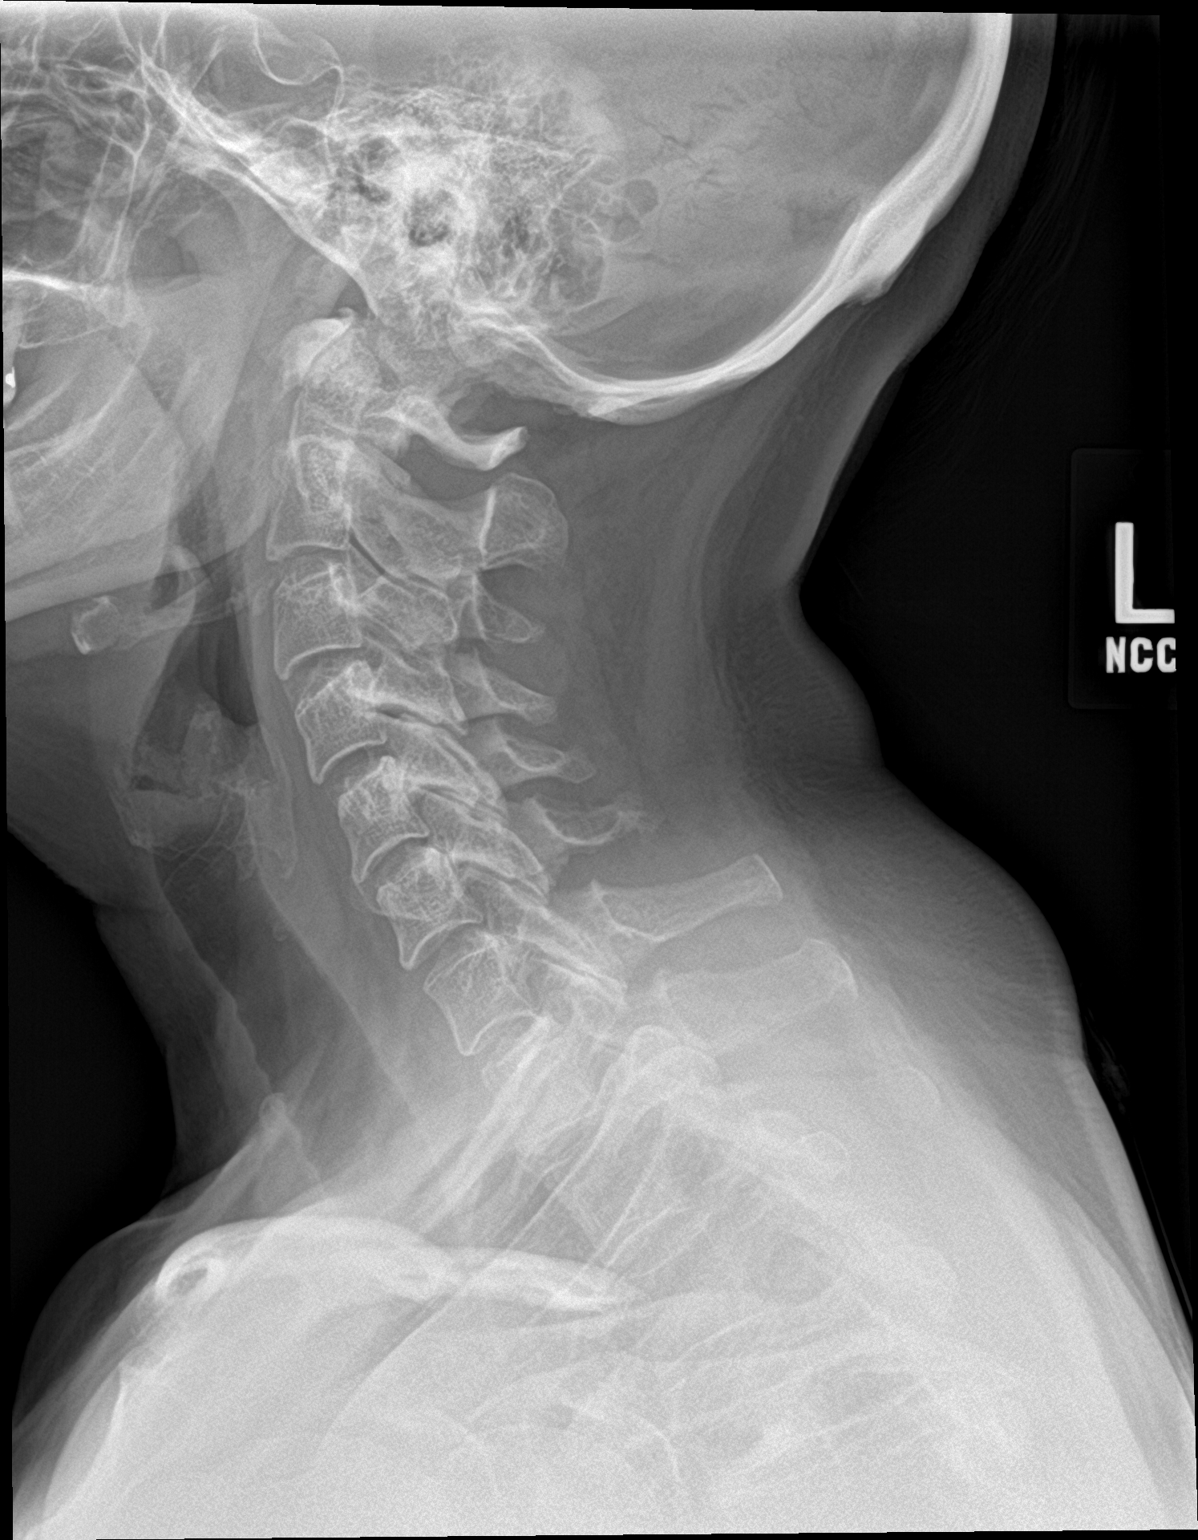

[c-spine ap]
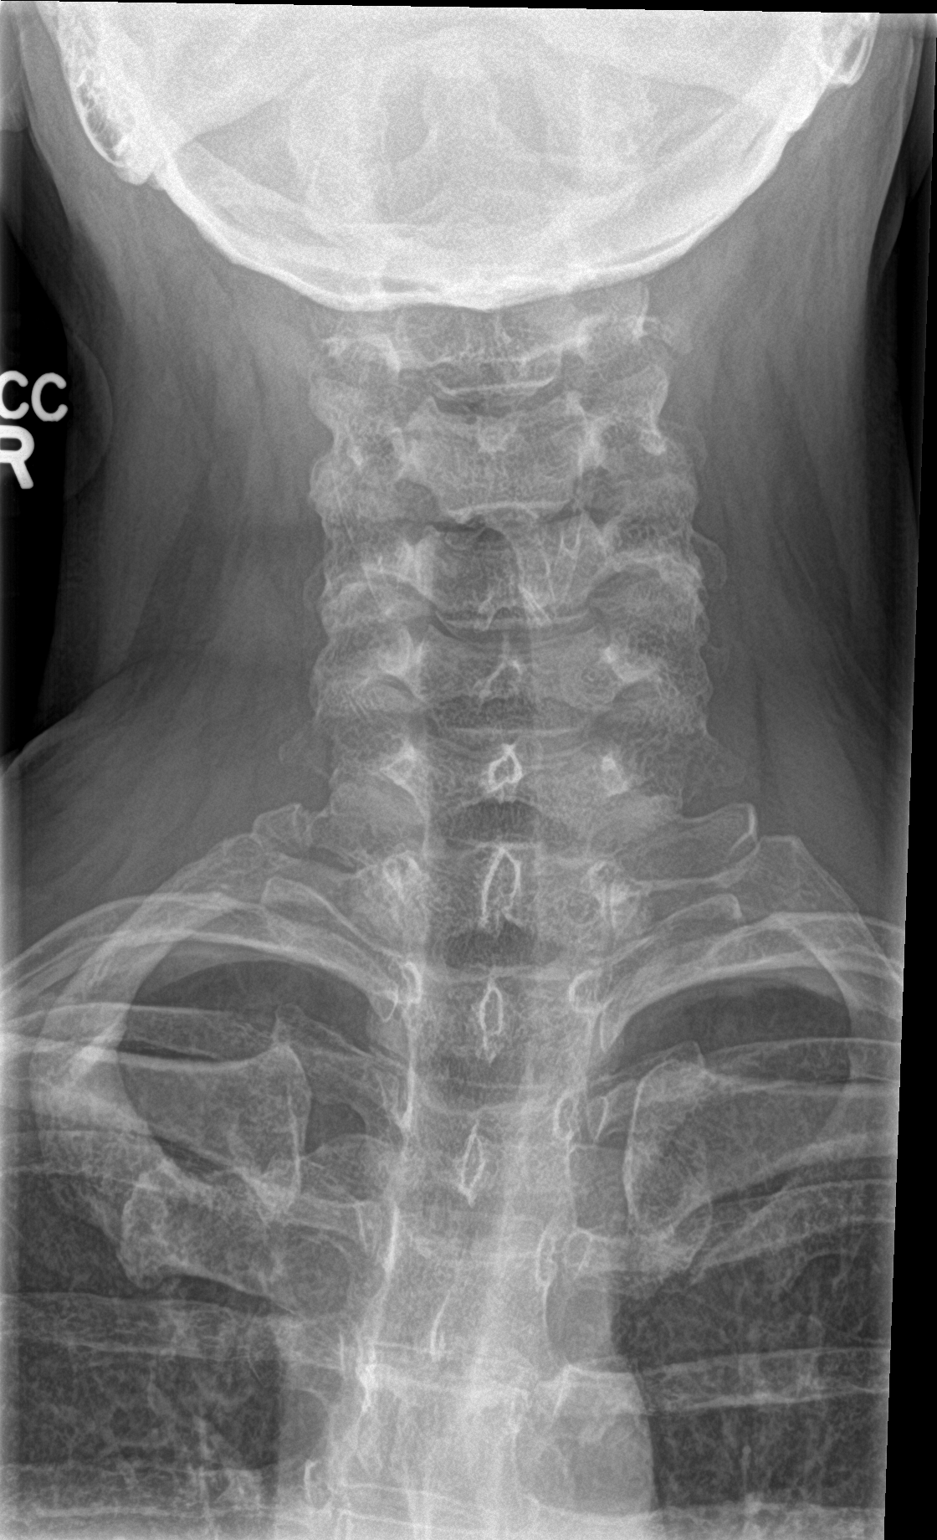

[2 of 2 positions shown; findings below may reference images not displayed]

FINDINGS: There is no evidence of cervical spine fracture or prevertebral soft
tissue swelling. Alignment is normal. No other significant bone
abnormalities are identified.
IMPRESSION: Negative cervical spine radiographs.

## 2019-07-08 IMAGING — DX DG SHOULDER 2+V*L*
3 series · 3 of 3 positions shown · non-contrast
Comparison: None.

CLINICAL DATA: Chronic left shoulder pain

EXAM:
LEFT SHOULDER - 2+ VIEW

[shoulder y view]
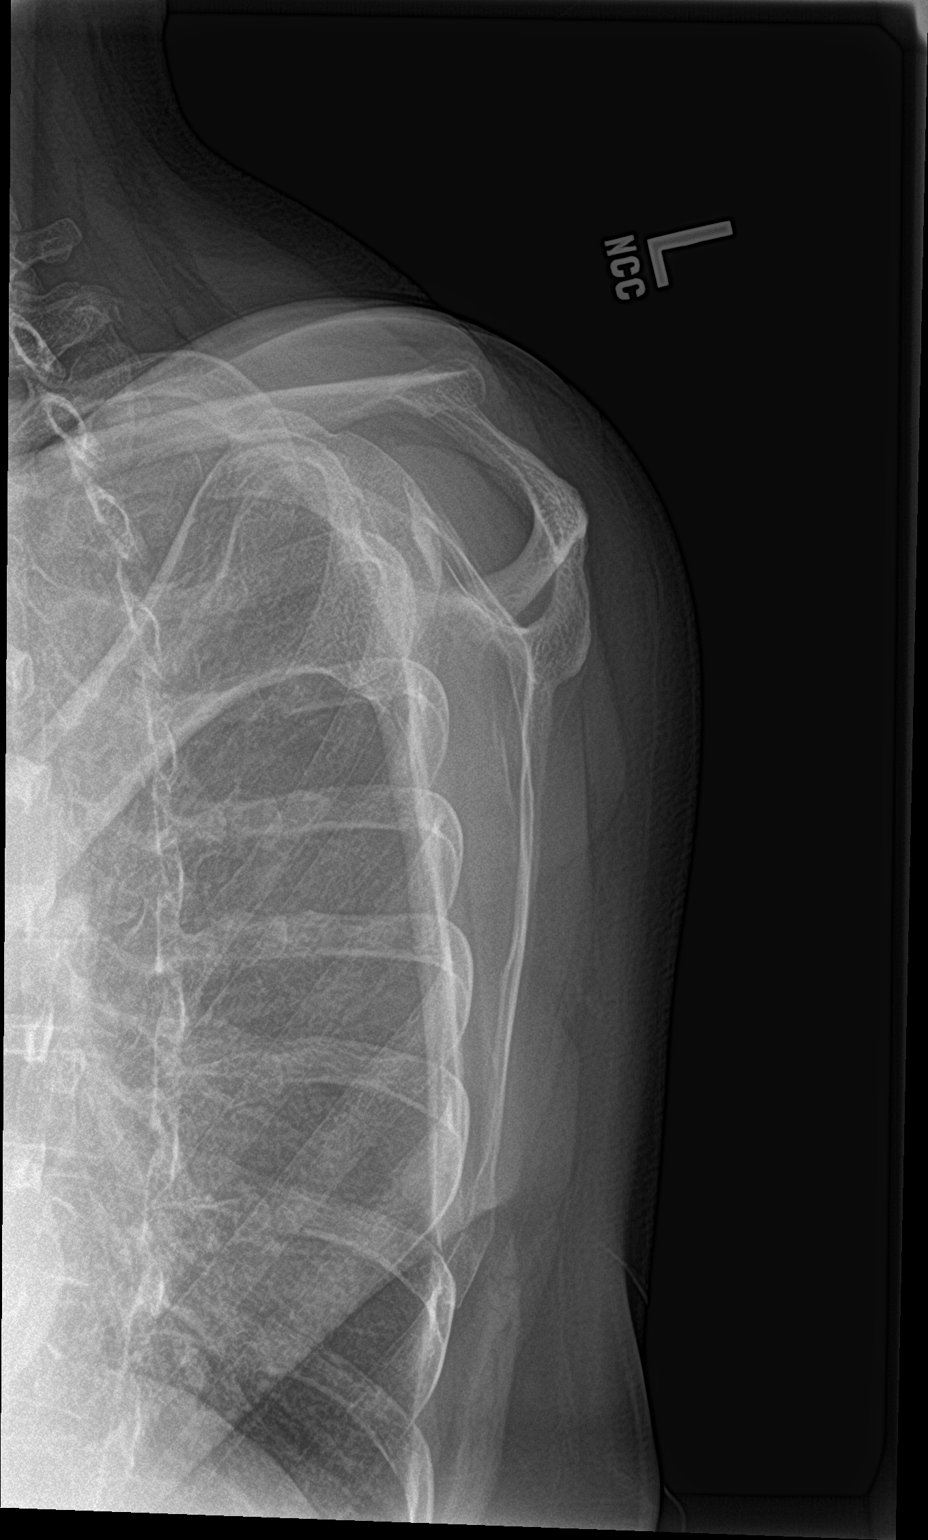

[shoulder axillary]
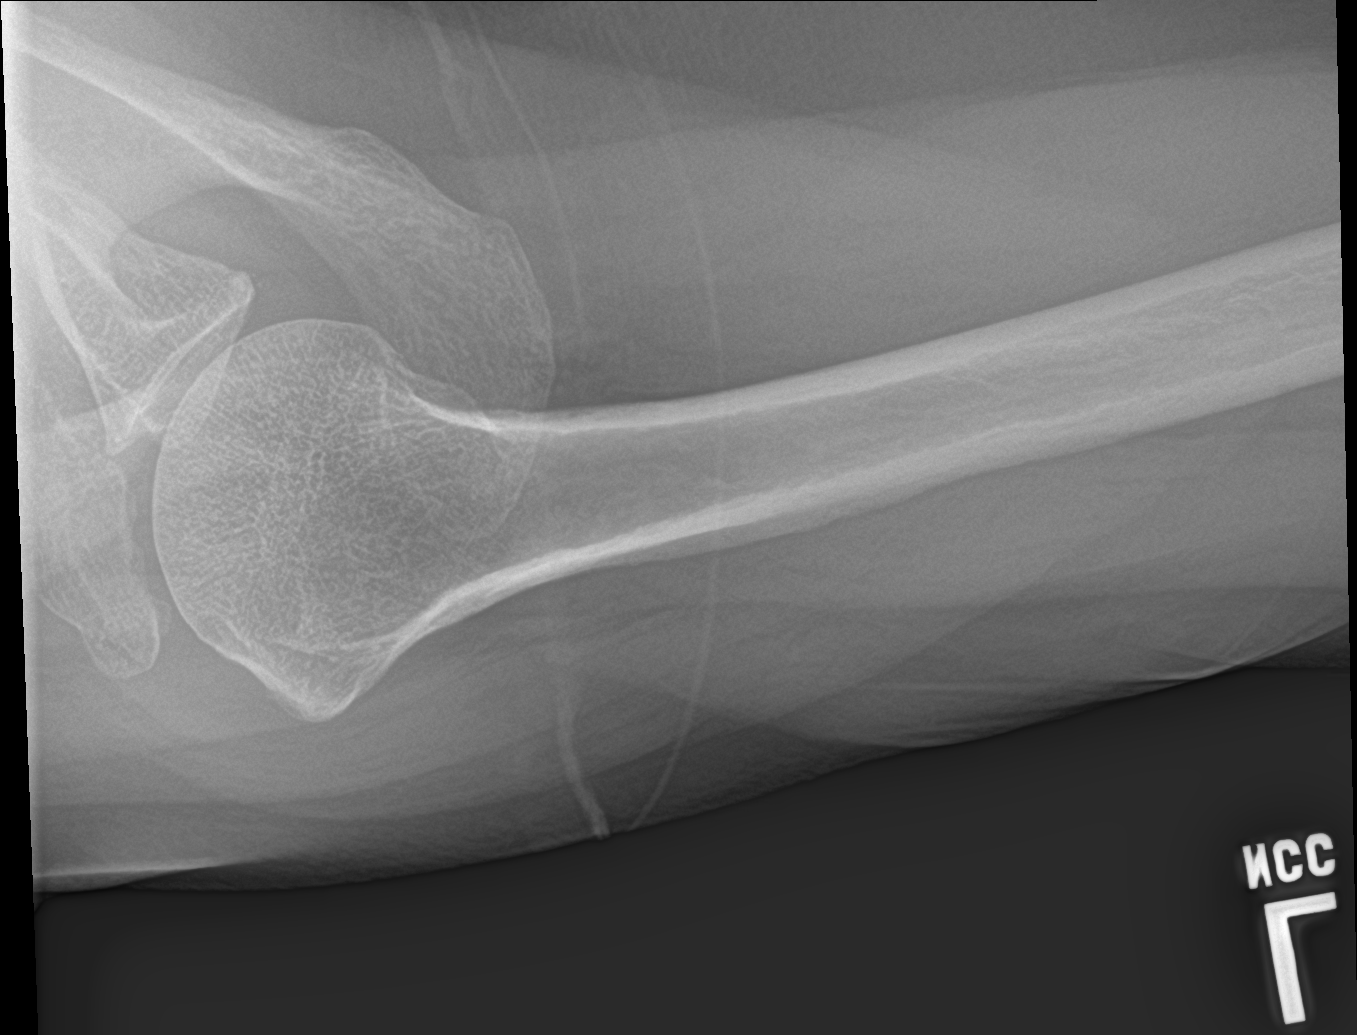

[shoulder grashey]
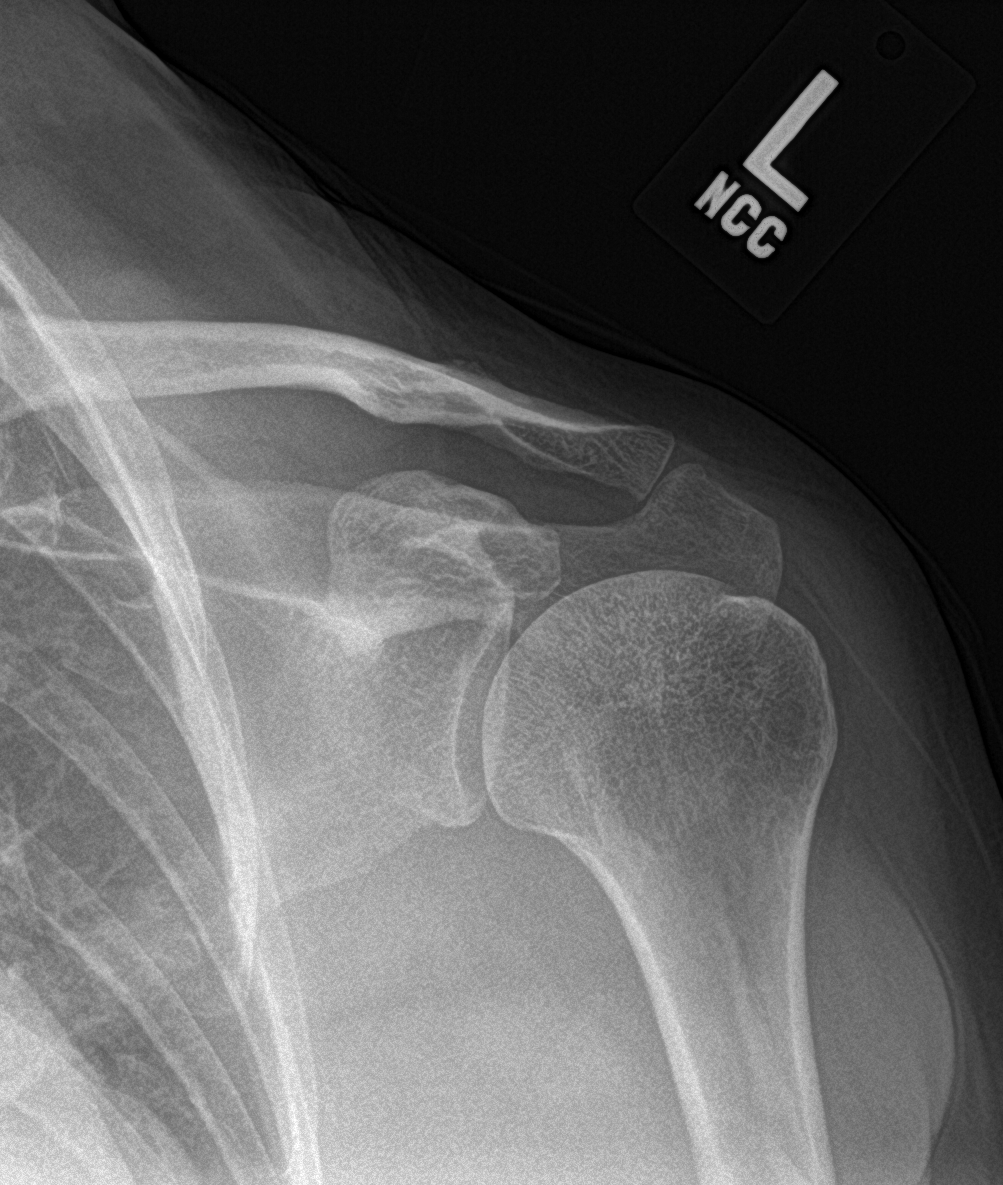

[3 of 3 positions shown; findings below may reference images not displayed]

FINDINGS: There is no evidence of fracture or dislocation. There is no
evidence of arthropathy or other focal bone abnormality. Soft
tissues are unremarkable.
IMPRESSION: Negative.

## 2019-07-20 ENCOUNTER — Other Ambulatory Visit: Payer: Self-pay | Admitting: Registered Nurse

## 2019-07-26 ENCOUNTER — Other Ambulatory Visit: Payer: Self-pay

## 2019-07-26 ENCOUNTER — Encounter: Payer: Medicare Other | Attending: Physical Medicine & Rehabilitation | Admitting: Registered Nurse

## 2019-07-26 ENCOUNTER — Encounter: Payer: Self-pay | Admitting: Registered Nurse

## 2019-07-26 VITALS — BP 147/94 | HR 88 | Temp 97.7°F | Ht 63.0 in | Wt 148.0 lb

## 2019-07-26 DIAGNOSIS — K589 Irritable bowel syndrome without diarrhea: Secondary | ICD-10-CM | POA: Diagnosis not present

## 2019-07-26 DIAGNOSIS — E119 Type 2 diabetes mellitus without complications: Secondary | ICD-10-CM | POA: Diagnosis not present

## 2019-07-26 DIAGNOSIS — M5412 Radiculopathy, cervical region: Secondary | ICD-10-CM | POA: Diagnosis not present

## 2019-07-26 DIAGNOSIS — G9050 Complex regional pain syndrome I, unspecified: Secondary | ICD-10-CM | POA: Insufficient documentation

## 2019-07-26 DIAGNOSIS — Z5181 Encounter for therapeutic drug level monitoring: Secondary | ICD-10-CM

## 2019-07-26 DIAGNOSIS — M797 Fibromyalgia: Secondary | ICD-10-CM | POA: Diagnosis not present

## 2019-07-26 DIAGNOSIS — M542 Cervicalgia: Secondary | ICD-10-CM

## 2019-07-26 DIAGNOSIS — Z9889 Other specified postprocedural states: Secondary | ICD-10-CM | POA: Insufficient documentation

## 2019-07-26 DIAGNOSIS — Z9049 Acquired absence of other specified parts of digestive tract: Secondary | ICD-10-CM | POA: Diagnosis not present

## 2019-07-26 DIAGNOSIS — Z809 Family history of malignant neoplasm, unspecified: Secondary | ICD-10-CM | POA: Diagnosis not present

## 2019-07-26 DIAGNOSIS — Z79899 Other long term (current) drug therapy: Secondary | ICD-10-CM | POA: Insufficient documentation

## 2019-07-26 DIAGNOSIS — Z87891 Personal history of nicotine dependence: Secondary | ICD-10-CM | POA: Diagnosis not present

## 2019-07-26 DIAGNOSIS — M25512 Pain in left shoulder: Secondary | ICD-10-CM | POA: Diagnosis not present

## 2019-07-26 DIAGNOSIS — M609 Myositis, unspecified: Secondary | ICD-10-CM | POA: Insufficient documentation

## 2019-07-26 DIAGNOSIS — E785 Hyperlipidemia, unspecified: Secondary | ICD-10-CM | POA: Diagnosis not present

## 2019-07-26 DIAGNOSIS — M461 Sacroiliitis, not elsewhere classified: Secondary | ICD-10-CM | POA: Insufficient documentation

## 2019-07-26 DIAGNOSIS — G894 Chronic pain syndrome: Secondary | ICD-10-CM | POA: Insufficient documentation

## 2019-07-26 DIAGNOSIS — M75102 Unspecified rotator cuff tear or rupture of left shoulder, not specified as traumatic: Secondary | ICD-10-CM | POA: Diagnosis not present

## 2019-07-26 DIAGNOSIS — M2550 Pain in unspecified joint: Secondary | ICD-10-CM

## 2019-07-26 DIAGNOSIS — M5010 Cervical disc disorder with radiculopathy, unspecified cervical region: Secondary | ICD-10-CM | POA: Diagnosis not present

## 2019-07-26 DIAGNOSIS — M5416 Radiculopathy, lumbar region: Secondary | ICD-10-CM | POA: Insufficient documentation

## 2019-07-26 DIAGNOSIS — M810 Age-related osteoporosis without current pathological fracture: Secondary | ICD-10-CM | POA: Insufficient documentation

## 2019-07-26 DIAGNOSIS — I10 Essential (primary) hypertension: Secondary | ICD-10-CM | POA: Diagnosis not present

## 2019-07-26 DIAGNOSIS — G90512 Complex regional pain syndrome I of left upper limb: Secondary | ICD-10-CM

## 2019-07-26 DIAGNOSIS — M546 Pain in thoracic spine: Secondary | ICD-10-CM

## 2019-07-26 DIAGNOSIS — Z90710 Acquired absence of both cervix and uterus: Secondary | ICD-10-CM | POA: Insufficient documentation

## 2019-07-26 DIAGNOSIS — G8929 Other chronic pain: Secondary | ICD-10-CM

## 2019-07-26 DIAGNOSIS — M47817 Spondylosis without myelopathy or radiculopathy, lumbosacral region: Secondary | ICD-10-CM | POA: Diagnosis present

## 2019-07-26 MED ORDER — LIDOCAINE 5 % EX PTCH: 1.0000 | MEDICATED_PATCH | CUTANEOUS | 3 refills | Status: AC

## 2019-07-26 MED ORDER — HYDROCODONE-ACETAMINOPHEN 7.5-325 MG PO TABS: 1.0000 | ORAL_TABLET | Freq: Four times a day (QID) | ORAL | 0 refills | Status: DC | PRN

## 2019-07-26 NOTE — Progress Notes (Signed)
Subjective:    Patient ID: Shannon French, female    DOB: 1965-12-12, 53 y.o.   MRN: [id number]  HPI: Shannon French is a 53 y.o. female who returns for follow up appointment for chronic pain and medication refill. She states her pain is located in her neck radiating into her left shoulder and left arm, mid- lower back pain radiating into her bilateral lower extremities and bilateral feet. She rates her pain 7. Her current exercise regime is walking and performing stretching exercises.  Ms. Hoiland Morphine equivalent is 30.00 MME.    Last Oral Swab was Performed on 03/15/2019, it was consistent.   Pain Inventory Average Pain 6 Pain Right Now 7 My pain is constant, sharp, burning, dull, stabbing, tingling and aching  In the last 24 hours, has pain interfered with the following? General activity 4 Relation with others 4 Enjoyment of life 4 What TIME of day is your pain at its worst? . Sleep (in general) Fair  Pain is worse with: walking, bending, sitting, inactivity, standing and some activites Pain improves with: rest, heat/ice, therapy/exercise, pacing activities and medication Relief from Meds: 5  Mobility ability to climb steps?  yes do you drive?  yes  Function disabled: date disabled . I need assistance with the following:  meal prep, household duties and shopping  Neuro/Psych weakness numbness tremor tingling spasms anxiety  Prior Studies Any changes since last visit?  no  Physicians involved in your care Any changes since last visit?  no   Family History  Problem Relation Age of Onset  . Cancer Father    Social History   Socioeconomic History  . Marital status: Single    Spouse name: Not on file  . Number of children: Not on file  . Years of education: Not on file  . Highest education level: Not on file  Occupational History  . Not on file  Tobacco Use  . Smoking status: Former Smoker    Quit date: 10/14/2009    Years since quitting: 9.7  .  Smokeless tobacco: Never Used  Substance and Sexual Activity  . Alcohol use: No  . Drug use: No  . Sexual activity: Not on file  Other Topics Concern  . Not on file  Social History Narrative  . Not on file   Social Determinants of Health   Financial Resource Strain:   . Difficulty of Paying Living Expenses: Not on file  Food Insecurity:   . Worried About Programme researcher, broadcasting/film/video in the Last Year: Not on file  . Ran Out of Food in the Last Year: Not on file  Transportation Needs:   . Lack of Transportation (Medical): Not on file  . Lack of Transportation (Non-Medical): Not on file  Physical Activity:   . Days of Exercise per Week: Not on file  . Minutes of Exercise per Session: Not on file  Stress:   . Feeling of Stress : Not on file  Social Connections:   . Frequency of Communication with Friends and Family: Not on file  . Frequency of Social Gatherings with Friends and Family: Not on file  . Attends Religious Services: Not on file  . Active Member of Clubs or Organizations: Not on file  . Attends Banker Meetings: Not on file  . Marital Status: Not on file   Past Surgical History:  Procedure Laterality Date  . ABDOMINAL HYSTERECTOMY    . ABDOMINAL SURGERY    . APPENDECTOMY    .  CHOLECYSTECTOMY    . CYST EXCISION    . OVARY SURGERY     Past Medical History:  Diagnosis Date  . Arm pain    left  . Degenerative disc disease   . Diabetes mellitus   . Fibromyalgia   . Hyperlipidemia   . Hypertension   . IBS (irritable bowel syndrome)   . Osteoporosis    There were no vitals taken for this visit.  Opioid Risk Score:   Fall Risk Score:  `1  Depression screen PHQ 2/9  Depression screen [facility] 2/9 06/01/2019 11/04/2018 09/08/2018 07/08/2018 07/22/2017 11/12/2016 08/12/2016  Decreased Interest 0 0 0 0 0 0 0  Down, Depressed, Hopeless - 0 0 0 0 0 0  PHQ - 2 Score 0 0 0 0 0 0 0  Altered sleeping - - - - - - -  Tired, decreased energy - - - - - - -  Change in  appetite - - - - - - -  Feeling bad or failure about yourself  - - - - - - -  Trouble concentrating - - - - - - -  Moving slowly or fidgety/restless - - - - - - -  Suicidal thoughts - - - - - - -  PHQ-9 Score - - - - - - -     Review of Systems  Constitutional: Negative.   HENT: Negative.   Eyes: Negative.   Respiratory: Negative.   Cardiovascular: Negative.   Gastrointestinal: Negative.   Endocrine: Negative.   Genitourinary: Negative.   Musculoskeletal: Positive for arthralgias, back pain and myalgias.  Skin: Negative.   Allergic/Immunologic: Negative.   Neurological: Positive for tremors, weakness and numbness.  Hematological: Negative.   Psychiatric/Behavioral: Negative.        Objective:   Physical Exam Vitals and nursing note reviewed.  Constitutional:      Appearance: Normal appearance.  Neck:     Comments: Cervical Paraspinal Tenderness: C-5-C-6 Cardiovascular:     Rate and Rhythm: Normal rate and regular rhythm.     Pulses: Normal pulses.     Heart sounds: Normal heart sounds.  Pulmonary:     Effort: Pulmonary effort is normal.     Breath sounds: Normal breath sounds.  Musculoskeletal:     Cervical back: Normal range of motion and neck supple.     Comments: Normal Muscle Bulk and Muscle Testing Reveals: Upper Extremities: Full ROM and Muscle Strength 5/5 Thoracic Hypersensitivity: T-1-T-7 Lumbar Paraspinal Tenderness: L-3-L-5 Lower Extremities: Full ROM and Muscle Strength 5/5 Arises from Table with ease Narrow Based  Gait   Skin:    General: Skin is warm and dry.  Neurological:     Mental Status: She is alert and oriented to person, place, and time.  Psychiatric:        Mood and Affect: Mood normal.        Behavior: Behavior normal.           Assessment & Plan:  1. Bilateral sacroiliac joint dysfunction:07/26/2019 Refilled:HYDROcodone 7.5/325mg  one tablet every 6 hrs as needed # 120.Second script sent for the following month. We will  continue the opioid monitoring program, this consists of regular clinic visits, examinations, urine drug screen pill counts as well as use of [place] Controlled Substance reporting System. 2. Chronic pain syndrome consistent with fibromyalgia. Continue with exercise regime, heat therapy,voltaren gel, lidocaine patches and topamax. Continue current analgesics .07/26/2019 3. Lumbosacral Spondylosis/ Bilaterallumbar radiculopathy:Continue Elavil and Cymbalta.07/26/2019 4. Complex Regional Pain Syndrome: Continue  Cymbalta.12/21/2020DrYolanda Bonine. Bertrand Following  5. Type 2 diabetes.: PCPFollowing.07/26/2019 6. Muscle Spasms: Continue Tizanidine 4 mg one tablet every 8 hours as needed for muscle spasms.07/26/2019 7. Insomnia: Continue Elavil.07/26/2019 8.Cervical Radiculopathy: Continue Elavil:07/26/2019 9. Cervicalgia/ Cervical Inflammation:Continue current medication regime. Alternate with heat and Ice Therapy.07/26/2019 10. Bilateral Thoracic Pain:Continue HEP as Tolerated.  Continue current medication regime. Continue to Monitor.07/26/2019 11. Left Shoulder Pain:Continue with HEP as Tolerated. Continue to Alternate Ice and Heat. Continue to monitor. Ortho Following. 07/26/2019.  F/U in 2 months   15 minutes of face to face patient care time was spent during this visit. All questions were encouraged and answered.

## 2019-09-09 ENCOUNTER — Telehealth: Payer: Self-pay | Admitting: Registered Nurse

## 2019-09-09 ENCOUNTER — Telehealth: Payer: Self-pay

## 2019-09-09 MED ORDER — HYDROCODONE-ACETAMINOPHEN 5-325 MG PO TABS: 1.0000 | ORAL_TABLET | Freq: Every day | ORAL | 0 refills | Status: DC | PRN

## 2019-09-09 NOTE — Telephone Encounter (Signed)
Patient called stating she is unable to get Hydrocodone 7.5, pharmacy tried ordering it twice and tried two other pharmacies. She states pharmacist saying send new Rx for either Hydrocodone 5/325 mg or 10/325 mg

## 2019-09-09 NOTE — Telephone Encounter (Signed)
Hydrocodone e- scribed. Ms. Brunker is aware: see previous note.

## 2019-09-09 NOTE — Telephone Encounter (Signed)
Call placed to [facility]: Hydrocodone 7.5/ 325 on back order. Hydrocodone 7.5 discontinues and Hydrocodone 5/325 mg ordered. Spoke with Shannon French regarding the above, she verbalizes understanding. New prescription e-scribed today.

## 2019-09-09 NOTE — Telephone Encounter (Signed)
Return Ms. Deandrade call, she will see if [facility] pharmacy will have the Hydrocodone. She will call provider with answer.

## 2019-09-19 ENCOUNTER — Other Ambulatory Visit: Payer: Self-pay | Admitting: Registered Nurse

## 2019-09-28 ENCOUNTER — Encounter: Payer: Medicare Other | Attending: Physical Medicine & Rehabilitation | Admitting: Registered Nurse

## 2019-09-28 ENCOUNTER — Encounter: Payer: Self-pay | Admitting: Registered Nurse

## 2019-09-28 ENCOUNTER — Other Ambulatory Visit: Payer: Self-pay

## 2019-09-28 VITALS — BP 124/90 | HR 87 | Temp 97.5°F | Ht 63.0 in | Wt 150.0 lb

## 2019-09-28 DIAGNOSIS — M542 Cervicalgia: Secondary | ICD-10-CM | POA: Diagnosis present

## 2019-09-28 DIAGNOSIS — M5010 Cervical disc disorder with radiculopathy, unspecified cervical region: Secondary | ICD-10-CM | POA: Insufficient documentation

## 2019-09-28 DIAGNOSIS — Z9049 Acquired absence of other specified parts of digestive tract: Secondary | ICD-10-CM | POA: Insufficient documentation

## 2019-09-28 DIAGNOSIS — K589 Irritable bowel syndrome without diarrhea: Secondary | ICD-10-CM | POA: Insufficient documentation

## 2019-09-28 DIAGNOSIS — M609 Myositis, unspecified: Secondary | ICD-10-CM | POA: Insufficient documentation

## 2019-09-28 DIAGNOSIS — E785 Hyperlipidemia, unspecified: Secondary | ICD-10-CM | POA: Insufficient documentation

## 2019-09-28 DIAGNOSIS — Z809 Family history of malignant neoplasm, unspecified: Secondary | ICD-10-CM | POA: Diagnosis not present

## 2019-09-28 DIAGNOSIS — Z5181 Encounter for therapeutic drug level monitoring: Secondary | ICD-10-CM | POA: Insufficient documentation

## 2019-09-28 DIAGNOSIS — E119 Type 2 diabetes mellitus without complications: Secondary | ICD-10-CM | POA: Diagnosis not present

## 2019-09-28 DIAGNOSIS — M47817 Spondylosis without myelopathy or radiculopathy, lumbosacral region: Secondary | ICD-10-CM | POA: Diagnosis present

## 2019-09-28 DIAGNOSIS — M5416 Radiculopathy, lumbar region: Secondary | ICD-10-CM | POA: Diagnosis not present

## 2019-09-28 DIAGNOSIS — G9050 Complex regional pain syndrome I, unspecified: Secondary | ICD-10-CM | POA: Insufficient documentation

## 2019-09-28 DIAGNOSIS — M810 Age-related osteoporosis without current pathological fracture: Secondary | ICD-10-CM | POA: Diagnosis not present

## 2019-09-28 DIAGNOSIS — M797 Fibromyalgia: Secondary | ICD-10-CM | POA: Diagnosis not present

## 2019-09-28 DIAGNOSIS — M546 Pain in thoracic spine: Secondary | ICD-10-CM

## 2019-09-28 DIAGNOSIS — Z90710 Acquired absence of both cervix and uterus: Secondary | ICD-10-CM | POA: Insufficient documentation

## 2019-09-28 DIAGNOSIS — Z79891 Long term (current) use of opiate analgesic: Secondary | ICD-10-CM | POA: Diagnosis present

## 2019-09-28 DIAGNOSIS — M5412 Radiculopathy, cervical region: Secondary | ICD-10-CM | POA: Diagnosis not present

## 2019-09-28 DIAGNOSIS — Z79899 Other long term (current) drug therapy: Secondary | ICD-10-CM | POA: Diagnosis present

## 2019-09-28 DIAGNOSIS — G894 Chronic pain syndrome: Secondary | ICD-10-CM | POA: Insufficient documentation

## 2019-09-28 DIAGNOSIS — Z87891 Personal history of nicotine dependence: Secondary | ICD-10-CM | POA: Insufficient documentation

## 2019-09-28 DIAGNOSIS — Z9889 Other specified postprocedural states: Secondary | ICD-10-CM | POA: Insufficient documentation

## 2019-09-28 DIAGNOSIS — G8929 Other chronic pain: Secondary | ICD-10-CM

## 2019-09-28 DIAGNOSIS — I10 Essential (primary) hypertension: Secondary | ICD-10-CM | POA: Diagnosis not present

## 2019-09-28 DIAGNOSIS — M461 Sacroiliitis, not elsewhere classified: Secondary | ICD-10-CM | POA: Diagnosis not present

## 2019-09-28 DIAGNOSIS — M25512 Pain in left shoulder: Secondary | ICD-10-CM

## 2019-09-28 MED ORDER — HYDROCODONE-ACETAMINOPHEN 7.5-325 MG PO TABS: 1.0000 | ORAL_TABLET | Freq: Four times a day (QID) | ORAL | 0 refills | Status: DC | PRN

## 2019-09-28 NOTE — Progress Notes (Signed)
Subjective:    Patient ID: Shannon French, female    DOB: 10/22/1965, 54 y.o.   MRN: [id number]  HPI: Shannon French is a 54 y.o. female who returns for follow up appointment for chronic pain and medication refill. She states her pain is located in her neck radiating into her left shoulder, mid- lower back pain. Also reports increase intensity of pain in her bilateral hands with tingling and burning and erythema noted, she reports this began a month ago, Dr. Ranell Patrick assessed Shannon French bilateral hands with this provider. . She rates her  Pain 8. Her current exercise regime is walking and performing stretching exercises with bands. .  Ms. Mcmanaway Morphine equivalent is 30.00 MME.  Oral Swab ordered today.    Pain Inventory Average Pain 6 Pain Right Now 8 My pain is constant, sharp, burning, dull, stabbing, tingling and aching  In the last 24 hours, has pain interfered with the following? General activity 6 Relation with others 6 Enjoyment of life 5 What TIME of day is your pain at its worst? all Sleep (in general) Fair  Pain is worse with: walking, bending, sitting, inactivity, standing and some activites Pain improves with: rest, heat/ice, therapy/exercise, pacing activities and medication Relief from Meds: 5  Mobility walk without assistance ability to climb steps?  yes do you drive?  yes Do you have any goals in this area?  yes  Function disabled: date disabled . I need assistance with the following:  meal prep, household duties and shopping Do you have any goals in this area?  yes  Neuro/Psych numbness tingling spasms anxiety  Prior Studies Any changes since last visit?  no  Physicians involved in your care Any changes since last visit?  no   Family History  Problem Relation Age of Onset  . Cancer Father    Social History   Socioeconomic History  . Marital status: Single    Spouse name: Not on file  . Number of children: Not on file  . Years of education: Not  on file  . Highest education level: Not on file  Occupational History  . Not on file  Tobacco Use  . Smoking status: Former Smoker    Quit date: 10/14/2009    Years since quitting: 9.9  . Smokeless tobacco: Never Used  Substance and Sexual Activity  . Alcohol use: No  . Drug use: No  . Sexual activity: Not on file  Other Topics Concern  . Not on file  Social History Narrative  . Not on file   Social Determinants of Health   Financial Resource Strain:   . Difficulty of Paying Living Expenses: Not on file  Food Insecurity:   . Worried About Charity fundraiser in the Last Year: Not on file  . Ran Out of Food in the Last Year: Not on file  Transportation Needs:   . Lack of Transportation (Medical): Not on file  . Lack of Transportation (Non-Medical): Not on file  Physical Activity:   . Days of Exercise per Week: Not on file  . Minutes of Exercise per Session: Not on file  Stress:   . Feeling of Stress : Not on file  Social Connections:   . Frequency of Communication with Friends and Family: Not on file  . Frequency of Social Gatherings with Friends and Family: Not on file  . Attends Religious Services: Not on file  . Active Member of Clubs or Organizations: Not on file  . Attends Club  or Organization Meetings: Not on file  . Marital Status: Not on file   Past Surgical History:  Procedure Laterality Date  . ABDOMINAL HYSTERECTOMY    . ABDOMINAL SURGERY    . APPENDECTOMY    . CHOLECYSTECTOMY    . CYST EXCISION    . OVARY SURGERY     Past Medical History:  Diagnosis Date  . Arm pain    left  . Degenerative disc disease   . Diabetes mellitus   . Fibromyalgia   . Hyperlipidemia   . Hypertension   . IBS (irritable bowel syndrome)   . Osteoporosis    BP 124/90   Pulse 87   Temp (!) 97.5 F (36.4 C)   Ht 5\' 3"  (1.6 m)   Wt 150 lb (68 kg)   SpO2 96%   BMI 26.57 kg/m   Opioid Risk Score:   Fall Risk Score:  `1  Depression screen PHQ 2/9  Depression  screen [facility] 2/9 06/01/2019 11/04/2018 09/08/2018 07/08/2018 07/22/2017 11/12/2016 08/12/2016  Decreased Interest 0 0 0 0 0 0 0  Down, Depressed, Hopeless - 0 0 0 0 0 0  PHQ - 2 Score 0 0 0 0 0 0 0  Altered sleeping - - - - - - -  Tired, decreased energy - - - - - - -  Change in appetite - - - - - - -  Feeling bad or failure about yourself  - - - - - - -  Trouble concentrating - - - - - - -  Moving slowly or fidgety/restless - - - - - - -  Suicidal thoughts - - - - - - -  PHQ-9 Score - - - - - - -    Review of Systems  Constitutional: Negative.   HENT: Negative.   Eyes: Negative.   Respiratory: Negative.   Cardiovascular: Negative.   Gastrointestinal: Negative.   Endocrine: Negative.   Musculoskeletal: Positive for arthralgias, back pain, myalgias, neck pain and neck stiffness.       Spasms   Allergic/Immunologic: Negative.   Neurological: Positive for numbness.       Tingling   Psychiatric/Behavioral: The patient is nervous/anxious.   All other systems reviewed and are negative.      Objective:   Physical Exam Constitutional:      Appearance: Normal appearance.  Cardiovascular:     Rate and Rhythm: Normal rate and regular rhythm.     Pulses: Normal pulses.     Heart sounds: Normal heart sounds.  Pulmonary:     Effort: Pulmonary effort is normal.     Breath sounds: Normal breath sounds.  Musculoskeletal:     Cervical back: Normal range of motion and neck supple.     Comments: Normal Muscle Bulk and Muscle Testing Reveals:  Upper Extremities: Full ROM and Muscle Strength 4/5 Bilateral Thenar Region with erythema and tenderness with palpation  Thoracic Paraspinal Tenderness: T-1-T-3 and T-7-T-9 Mainly Left Side Lumbar Paraspinal Tenderness: L-3-L-5 Lower Extremities: Full ROM and Muscle Strength 5/5 Arises from Table with Ease Narrow Based  Gait   Skin:    General: Skin is warm and dry.  Neurological:     Mental Status: She is alert and oriented to person, place, and  time.  Psychiatric:        Mood and Affect: Mood normal.        Behavior: Behavior normal.           Assessment & Plan:  1. Bilateral  sacroiliac joint dysfunction:09/28/2019. Refilled:HYDROcodone 7.5/325mg  one tablet every 6 hrs as needed # 120.Second script sent for the following month. We will continue the opioid monitoring program, this consists of regular clinic visits, examinations, urine drug screen pill counts as well as use of [place] Controlled Substance reporting System. 2. Chronic pain syndrome consistent with fibromyalgia. Continue with exercise regime, heat therapy,voltaren gel, lidocaine patches and topamax. Continue current analgesics .09/28/2019. 3. Lumbosacral Spondylosis/ Bilaterallumbar radiculopathy:Continue Elavil and Cymbalta.09/28/2019, 4. Complex Regional Pain Syndrome: Continue Cymbalta.09/28/2019.Dr. Yolanda Bonine Following  5. Type 2 diabetes.: PCPFollowing.09/28/2019. 6. Muscle Spasms: Continue Tizanidine 4 mg one tablet every 8 hours as needed for muscle spasms.09/28/2019 7. Insomnia: Continue Elavil.09/28/2019 8.Cervical Radiculopathy: Continue Elavil:09/28/2019 9. Cervicalgia/ Cervical Inflammation: RX: Cervical X_Ray. Continue current medication regime. Alternate with heat and Ice Therapy.09/28/2019 10. Bilateral Thoracic Pain:Continue HEP as Tolerated.  Continue current medication regime. Continue to Monitor.09/28/2019 11. Left Shoulder Pain:Continue with HEP as Tolerated. Continue to Alternate Ice and Heat. Continue to monitor.Ortho Following. 09/28/2019.  F/U in 2 months   30 minutes of face to face patient care time was spent during this visit. All questions were encouraged and answered.

## 2019-09-29 ENCOUNTER — Ambulatory Visit
Admission: RE | Admit: 2019-09-29 | Discharge: 2019-09-29 | Disposition: A | Discharge status: Home / Self Care | Payer: Medicare Other | Source: Ambulatory Visit | Attending: Registered Nurse | Admitting: Registered Nurse

## 2019-10-01 LAB — DRUG TOX MONITOR 1 W/CONF, ORAL FLD
Amphetamines: NEGATIVE ng/mL (ref <10)
Barbiturates: NEGATIVE ng/mL (ref <10)
Benzodiazepines: NEGATIVE ng/mL (ref <0.50)
Buprenorphine: NEGATIVE ng/mL (ref <0.10)
Cocaine: NEGATIVE ng/mL (ref <5.0)
Codeine: NEGATIVE ng/mL (ref <2.5)
Cotinine: 35.8 ng/mL — ABNORMAL HIGH (ref <5.0)
Dihydrocodeine: 9.9 ng/mL — ABNORMAL HIGH (ref <2.5)
Fentanyl: NEGATIVE ng/mL (ref <0.10)
Heroin Metabolite: NEGATIVE ng/mL (ref <1.0)
Hydrocodone: 97.9 ng/mL — ABNORMAL HIGH (ref <2.5)
Hydromorphone: NEGATIVE ng/mL (ref <2.5)
MARIJUANA: NEGATIVE ng/mL (ref <2.5)
MDMA: NEGATIVE ng/mL (ref <10)
Meprobamate: NEGATIVE ng/mL (ref <2.5)
Methadone: NEGATIVE ng/mL (ref <5.0)
Morphine: NEGATIVE ng/mL (ref <2.5)
Nicotine Metabolite: POSITIVE ng/mL — AB (ref <5.0)
Norhydrocodone: 10.3 ng/mL — ABNORMAL HIGH (ref <2.5)
Noroxycodone: NEGATIVE ng/mL (ref <2.5)
Opiates: POSITIVE ng/mL — AB (ref <2.5)
Oxycodone: NEGATIVE ng/mL (ref <2.5)
Oxymorphone: NEGATIVE ng/mL (ref <2.5)
Phencyclidine: NEGATIVE ng/mL (ref <10)
Tapentadol: NEGATIVE ng/mL (ref <5.0)
Tramadol: NEGATIVE ng/mL (ref <5.0)
Zolpidem: NEGATIVE ng/mL (ref <5.0)

## 2019-10-01 LAB — DRUG TOX ALC METAB W/CON, ORAL FLD: Alcohol Metabolite: NEGATIVE ng/mL (ref <25)

## 2019-10-11 ENCOUNTER — Telehealth: Payer: Self-pay

## 2019-10-11 NOTE — Telephone Encounter (Signed)
Oral swab drug screen was consistent for prescribed medications.  ?

## 2019-10-20 ENCOUNTER — Other Ambulatory Visit: Payer: Self-pay | Admitting: Registered Nurse

## 2019-10-20 DIAGNOSIS — M47817 Spondylosis without myelopathy or radiculopathy, lumbosacral region: Secondary | ICD-10-CM

## 2019-10-20 DIAGNOSIS — Z5181 Encounter for therapeutic drug level monitoring: Secondary | ICD-10-CM

## 2019-10-20 DIAGNOSIS — G90512 Complex regional pain syndrome I of left upper limb: Secondary | ICD-10-CM

## 2019-10-20 DIAGNOSIS — M542 Cervicalgia: Secondary | ICD-10-CM

## 2019-10-20 DIAGNOSIS — Z79899 Other long term (current) drug therapy: Secondary | ICD-10-CM

## 2019-10-20 DIAGNOSIS — G8929 Other chronic pain: Secondary | ICD-10-CM

## 2019-10-20 DIAGNOSIS — G894 Chronic pain syndrome: Secondary | ICD-10-CM

## 2019-11-30 ENCOUNTER — Encounter: Payer: Self-pay | Admitting: Registered Nurse

## 2019-11-30 ENCOUNTER — Encounter: Payer: Medicare Other | Attending: Physical Medicine & Rehabilitation | Admitting: Registered Nurse

## 2019-11-30 ENCOUNTER — Other Ambulatory Visit: Payer: Self-pay

## 2019-11-30 VITALS — BP 117/77 | HR 77 | Temp 97.7°F | Ht 63.0 in | Wt 152.0 lb

## 2019-11-30 DIAGNOSIS — I10 Essential (primary) hypertension: Secondary | ICD-10-CM | POA: Diagnosis not present

## 2019-11-30 DIAGNOSIS — M5412 Radiculopathy, cervical region: Secondary | ICD-10-CM

## 2019-11-30 DIAGNOSIS — Z79899 Other long term (current) drug therapy: Secondary | ICD-10-CM | POA: Diagnosis present

## 2019-11-30 DIAGNOSIS — M461 Sacroiliitis, not elsewhere classified: Secondary | ICD-10-CM | POA: Diagnosis not present

## 2019-11-30 DIAGNOSIS — K589 Irritable bowel syndrome without diarrhea: Secondary | ICD-10-CM | POA: Insufficient documentation

## 2019-11-30 DIAGNOSIS — Z5181 Encounter for therapeutic drug level monitoring: Secondary | ICD-10-CM | POA: Diagnosis present

## 2019-11-30 DIAGNOSIS — M797 Fibromyalgia: Secondary | ICD-10-CM | POA: Insufficient documentation

## 2019-11-30 DIAGNOSIS — G9050 Complex regional pain syndrome I, unspecified: Secondary | ICD-10-CM | POA: Insufficient documentation

## 2019-11-30 DIAGNOSIS — M546 Pain in thoracic spine: Secondary | ICD-10-CM

## 2019-11-30 DIAGNOSIS — M5010 Cervical disc disorder with radiculopathy, unspecified cervical region: Secondary | ICD-10-CM | POA: Insufficient documentation

## 2019-11-30 DIAGNOSIS — E785 Hyperlipidemia, unspecified: Secondary | ICD-10-CM | POA: Diagnosis not present

## 2019-11-30 DIAGNOSIS — Z79891 Long term (current) use of opiate analgesic: Secondary | ICD-10-CM | POA: Insufficient documentation

## 2019-11-30 DIAGNOSIS — M542 Cervicalgia: Secondary | ICD-10-CM | POA: Diagnosis present

## 2019-11-30 DIAGNOSIS — M25512 Pain in left shoulder: Secondary | ICD-10-CM | POA: Diagnosis not present

## 2019-11-30 DIAGNOSIS — E119 Type 2 diabetes mellitus without complications: Secondary | ICD-10-CM | POA: Diagnosis not present

## 2019-11-30 DIAGNOSIS — G8929 Other chronic pain: Secondary | ICD-10-CM

## 2019-11-30 DIAGNOSIS — Z87891 Personal history of nicotine dependence: Secondary | ICD-10-CM | POA: Insufficient documentation

## 2019-11-30 DIAGNOSIS — G894 Chronic pain syndrome: Secondary | ICD-10-CM | POA: Diagnosis present

## 2019-11-30 DIAGNOSIS — M47817 Spondylosis without myelopathy or radiculopathy, lumbosacral region: Secondary | ICD-10-CM | POA: Insufficient documentation

## 2019-11-30 DIAGNOSIS — Z9049 Acquired absence of other specified parts of digestive tract: Secondary | ICD-10-CM | POA: Insufficient documentation

## 2019-11-30 DIAGNOSIS — Z9889 Other specified postprocedural states: Secondary | ICD-10-CM | POA: Insufficient documentation

## 2019-11-30 DIAGNOSIS — M5416 Radiculopathy, lumbar region: Secondary | ICD-10-CM | POA: Diagnosis not present

## 2019-11-30 DIAGNOSIS — M810 Age-related osteoporosis without current pathological fracture: Secondary | ICD-10-CM | POA: Diagnosis not present

## 2019-11-30 DIAGNOSIS — Z90710 Acquired absence of both cervix and uterus: Secondary | ICD-10-CM | POA: Insufficient documentation

## 2019-11-30 DIAGNOSIS — M2550 Pain in unspecified joint: Secondary | ICD-10-CM

## 2019-11-30 DIAGNOSIS — Z809 Family history of malignant neoplasm, unspecified: Secondary | ICD-10-CM | POA: Insufficient documentation

## 2019-11-30 DIAGNOSIS — M609 Myositis, unspecified: Secondary | ICD-10-CM | POA: Diagnosis not present

## 2019-11-30 MED ORDER — HYDROCODONE-ACETAMINOPHEN 7.5-325 MG PO TABS: 1.0000 | ORAL_TABLET | Freq: Four times a day (QID) | ORAL | 0 refills | Status: DC | PRN

## 2019-11-30 NOTE — Progress Notes (Signed)
Subjective:    Patient ID: Shannon French, female    DOB: 1966-06-06, 54 y.o.   MRN: [id number]  HPI: Shannon French is a 54 y.o. female who returns for follow up appointment for chronic pain and medication refill. She states her pain is located in her neck radiating into her left shoulder and mid- lower back pain Also reports bilateral hand pain, generalized joint pain and right pinky toe pain. Her pinky toe pain her podiatrist and PCP following, no drainaged noted.  She rates her pain 7. Her current exercise regime is walking and performing stretching exercises.  Shannon French states she has a Rheumatology appointment scheduled in May/2021.   Shannon French Morphine equivalent is 30.00  MME.  Last Oral Swab was Performed on 09/28/2019, it was consistent.    Pain Inventory Average Pain 7 Pain Right Now 7 My pain is constant, sharp, burning, dull, stabbing, tingling and aching  In the last 24 hours, has pain interfered with the following? General activity 8 Relation with others 8 Enjoyment of life 8 What TIME of day is your pain at its worst? all Sleep (in general) Fair  Pain is worse with: walking, bending, sitting, inactivity, standing and some activites Pain improves with: rest, heat/ice, therapy/exercise, pacing activities and medication Relief from Meds: 5  Mobility walk without assistance how many minutes can you walk? 45 ability to climb steps?  yes do you drive?  yes Do you have any goals in this area?  yes  Function disabled: date disabled . I need assistance with the following:  meal prep, household duties and shopping Do you have any goals in this area?  yes  Neuro/Psych weakness numbness tingling spasms anxiety  Prior Studies Any changes since last visit?  no  Physicians involved in your care Any changes since last visit?  no   Family History  Problem Relation Age of Onset  . Cancer Father    Social History   Socioeconomic History  . Marital status:  Single    Spouse name: Not on file  . Number of children: Not on file  . Years of education: Not on file  . Highest education level: Not on file  Occupational History  . Not on file  Tobacco Use  . Smoking status: Former Smoker    Quit date: 10/14/2009    Years since quitting: 10.1  . Smokeless tobacco: Never Used  Substance and Sexual Activity  . Alcohol use: No  . Drug use: No  . Sexual activity: Not on file  Other Topics Concern  . Not on file  Social History Narrative  . Not on file   Social Determinants of Health   Financial Resource Strain:   . Difficulty of Paying Living Expenses:   Food Insecurity:   . Worried About Programme researcher, broadcasting/film/video in the Last Year:   . Barista in the Last Year:   Transportation Needs:   . Freight forwarder (Medical):   Marland Kitchen Lack of Transportation (Non-Medical):   Physical Activity:   . Days of Exercise per Week:   . Minutes of Exercise per Session:   Stress:   . Feeling of Stress :   Social Connections:   . Frequency of Communication with Friends and Family:   . Frequency of Social Gatherings with Friends and Family:   . Attends Religious Services:   . Active Member of Clubs or Organizations:   . Attends Banker Meetings:   Marland Kitchen Marital Status:  Past Surgical History:  Procedure Laterality Date  . ABDOMINAL HYSTERECTOMY    . ABDOMINAL SURGERY    . APPENDECTOMY    . CHOLECYSTECTOMY    . CYST EXCISION    . OVARY SURGERY     Past Medical History:  Diagnosis Date  . Arm pain    left  . Degenerative disc disease   . Diabetes mellitus   . Fibromyalgia   . Hyperlipidemia   . Hypertension   . IBS (irritable bowel syndrome)   . Osteoporosis    There were no vitals taken for this visit.  Opioid Risk Score:   Fall Risk Score:  `1  Depression screen PHQ 2/9  Depression screen [facility] 2/9 06/01/2019 11/04/2018 09/08/2018 07/08/2018 07/22/2017 11/12/2016 08/12/2016  Decreased Interest 0 0 0 0 0 0 0  Down, Depressed,  Hopeless - 0 0 0 0 0 0  PHQ - 2 Score 0 0 0 0 0 0 0  Altered sleeping - - - - - - -  Tired, decreased energy - - - - - - -  Change in appetite - - - - - - -  Feeling bad or failure about yourself  - - - - - - -  Trouble concentrating - - - - - - -  Moving slowly or fidgety/restless - - - - - - -  Suicidal thoughts - - - - - - -  PHQ-9 Score - - - - - - -    Review of Systems  HENT: Negative.   Eyes: Positive for photophobia.  Respiratory: Negative.   Cardiovascular: Positive for leg swelling.  Gastrointestinal: Negative.   Endocrine: Negative.   Genitourinary: Negative.   Musculoskeletal: Positive for arthralgias, back pain, myalgias, neck pain and neck stiffness.       Spasms   Skin: Negative.   Allergic/Immunologic: Negative.   Neurological: Positive for weakness and numbness.       Tingling   Psychiatric/Behavioral: The patient is nervous/anxious.   All other systems reviewed and are negative.      Objective:   Physical Exam Vitals and nursing note reviewed.  Constitutional:      Appearance: Normal appearance.  Neck:     Comments: Cervical Paraspinal Tenderness: C-5-C-6 Cardiovascular:     Rate and Rhythm: Normal rate and regular rhythm.     Pulses: Normal pulses.     Heart sounds: Normal heart sounds.  Pulmonary:     Effort: Pulmonary effort is normal.     Breath sounds: Normal breath sounds.  Musculoskeletal:     Cervical back: Normal range of motion and neck supple.     Comments: Normal Muscle Bulk and Muscle Testing Reveals:  Upper Extremities: Full ROM and Muscle Strength 4/5 Left AC Joint Tenderness  Thoracic Hypersensitivity Lumbar Paraspinal Tenderness: L-3-L-5 Lower Extremities: Full ROM and Muscle Strength 5/5 Arises from chair with ease  Narrow Based  Gait   Skin:    General: Skin is warm and dry.  Neurological:     Mental Status: She is alert and oriented to person, place, and time.  Psychiatric:        Mood and Affect: Mood normal.         Behavior: Behavior normal.           Assessment & Plan:  1. Bilateral sacroiliac joint dysfunction:11/30/2019. Refilled:HYDROcodone 7.5/325mg  one tablet every 6 hrs as needed # 120.Second script sent for the following month. We will continue the opioid monitoring program, this consists of regular  clinic visits, examinations, urine drug screen pill counts as well as use of [place] Controlled Substance reporting System. 2. Chronic pain syndrome consistent with fibromyalgia. Continue with exercise regime, heat therapy,voltaren gel, lidocaine patches and topamax. Continue current analgesics .11/30/2019. 3. Lumbosacral Spondylosis/ Bilaterallumbar radiculopathy:Continue Elavil and Cymbalta.11/30/2019, 4. Complex Regional Pain Syndrome: Continue Cymbalta..Dr. Yolanda Bonine Following. 11/30/2019 5. Type 2 diabetes.: PCPFollowing.11/30/2019. 6. Muscle Spasms: Continue Tizanidine 4 mg one tablet every 8 hours as needed for muscle spasms.11/30/2019 7. Insomnia: Continue Elavil.11/30/2019 8.Cervical Radiculopathy: Continue Elavil:11/30/2019 9. Cervicalgia/ Cervical Inflammation: Continue current medication regime. Alternate with heat and Ice Therapy.11/30/2019 10. Bilateral Thoracic Pain:Continue HEP as Tolerated.Continue current medication regime. Continue to Monitor.11/30/2019 11. Left Shoulder Pain:Continue with HEP as Tolerated. Continue to Alternate Ice and Heat. Continue to monitor.Ortho Following.11/30/2019.  F/U in 2 months  of face to face patient care time was spent during this visit. All questions were encouraged and answered.

## 2019-12-14 ENCOUNTER — Other Ambulatory Visit: Payer: Self-pay | Admitting: Registered Nurse

## 2020-01-15 ENCOUNTER — Other Ambulatory Visit: Payer: Self-pay | Admitting: Physical Medicine & Rehabilitation

## 2020-01-15 DIAGNOSIS — M797 Fibromyalgia: Secondary | ICD-10-CM

## 2020-01-15 DIAGNOSIS — Z5181 Encounter for therapeutic drug level monitoring: Secondary | ICD-10-CM

## 2020-01-15 DIAGNOSIS — M47817 Spondylosis without myelopathy or radiculopathy, lumbosacral region: Secondary | ICD-10-CM

## 2020-01-15 DIAGNOSIS — M25512 Pain in left shoulder: Secondary | ICD-10-CM

## 2020-01-15 DIAGNOSIS — Z79899 Other long term (current) drug therapy: Secondary | ICD-10-CM

## 2020-01-15 DIAGNOSIS — G90512 Complex regional pain syndrome I of left upper limb: Secondary | ICD-10-CM

## 2020-01-15 DIAGNOSIS — M542 Cervicalgia: Secondary | ICD-10-CM

## 2020-01-15 DIAGNOSIS — G894 Chronic pain syndrome: Secondary | ICD-10-CM

## 2020-01-23 ENCOUNTER — Other Ambulatory Visit: Payer: Self-pay | Admitting: Registered Nurse

## 2020-01-31 ENCOUNTER — Ambulatory Visit: Payer: Medicare Other | Admitting: Registered Nurse

## 2020-02-02 ENCOUNTER — Encounter: Payer: Medicare Other | Attending: Physical Medicine & Rehabilitation | Admitting: Registered Nurse

## 2020-02-02 ENCOUNTER — Other Ambulatory Visit: Payer: Self-pay

## 2020-02-02 ENCOUNTER — Encounter: Payer: Self-pay | Admitting: Registered Nurse

## 2020-02-02 VITALS — BP 117/79 | HR 73 | Temp 98.4°F | Ht 63.0 in | Wt 150.0 lb

## 2020-02-02 DIAGNOSIS — E785 Hyperlipidemia, unspecified: Secondary | ICD-10-CM | POA: Diagnosis not present

## 2020-02-02 DIAGNOSIS — M75102 Unspecified rotator cuff tear or rupture of left shoulder, not specified as traumatic: Secondary | ICD-10-CM

## 2020-02-02 DIAGNOSIS — Z5181 Encounter for therapeutic drug level monitoring: Secondary | ICD-10-CM | POA: Insufficient documentation

## 2020-02-02 DIAGNOSIS — Z79891 Long term (current) use of opiate analgesic: Secondary | ICD-10-CM | POA: Diagnosis present

## 2020-02-02 DIAGNOSIS — Z809 Family history of malignant neoplasm, unspecified: Secondary | ICD-10-CM | POA: Diagnosis not present

## 2020-02-02 DIAGNOSIS — Z79899 Other long term (current) drug therapy: Secondary | ICD-10-CM | POA: Insufficient documentation

## 2020-02-02 DIAGNOSIS — M79641 Pain in right hand: Secondary | ICD-10-CM

## 2020-02-02 DIAGNOSIS — E119 Type 2 diabetes mellitus without complications: Secondary | ICD-10-CM | POA: Insufficient documentation

## 2020-02-02 DIAGNOSIS — M542 Cervicalgia: Secondary | ICD-10-CM | POA: Insufficient documentation

## 2020-02-02 DIAGNOSIS — Z9049 Acquired absence of other specified parts of digestive tract: Secondary | ICD-10-CM | POA: Insufficient documentation

## 2020-02-02 DIAGNOSIS — G894 Chronic pain syndrome: Secondary | ICD-10-CM | POA: Diagnosis present

## 2020-02-02 DIAGNOSIS — M810 Age-related osteoporosis without current pathological fracture: Secondary | ICD-10-CM | POA: Diagnosis not present

## 2020-02-02 DIAGNOSIS — M5010 Cervical disc disorder with radiculopathy, unspecified cervical region: Secondary | ICD-10-CM | POA: Diagnosis not present

## 2020-02-02 DIAGNOSIS — I10 Essential (primary) hypertension: Secondary | ICD-10-CM | POA: Insufficient documentation

## 2020-02-02 DIAGNOSIS — M797 Fibromyalgia: Secondary | ICD-10-CM | POA: Diagnosis not present

## 2020-02-02 DIAGNOSIS — G9050 Complex regional pain syndrome I, unspecified: Secondary | ICD-10-CM | POA: Insufficient documentation

## 2020-02-02 DIAGNOSIS — M79642 Pain in left hand: Secondary | ICD-10-CM

## 2020-02-02 DIAGNOSIS — M25512 Pain in left shoulder: Secondary | ICD-10-CM

## 2020-02-02 DIAGNOSIS — Z9889 Other specified postprocedural states: Secondary | ICD-10-CM | POA: Diagnosis not present

## 2020-02-02 DIAGNOSIS — M5416 Radiculopathy, lumbar region: Secondary | ICD-10-CM | POA: Insufficient documentation

## 2020-02-02 DIAGNOSIS — M461 Sacroiliitis, not elsewhere classified: Secondary | ICD-10-CM | POA: Diagnosis not present

## 2020-02-02 DIAGNOSIS — K589 Irritable bowel syndrome without diarrhea: Secondary | ICD-10-CM | POA: Diagnosis not present

## 2020-02-02 DIAGNOSIS — G8929 Other chronic pain: Secondary | ICD-10-CM

## 2020-02-02 DIAGNOSIS — M47817 Spondylosis without myelopathy or radiculopathy, lumbosacral region: Secondary | ICD-10-CM | POA: Diagnosis present

## 2020-02-02 DIAGNOSIS — M2550 Pain in unspecified joint: Secondary | ICD-10-CM

## 2020-02-02 DIAGNOSIS — Z90710 Acquired absence of both cervix and uterus: Secondary | ICD-10-CM | POA: Insufficient documentation

## 2020-02-02 DIAGNOSIS — M609 Myositis, unspecified: Secondary | ICD-10-CM | POA: Insufficient documentation

## 2020-02-02 DIAGNOSIS — M546 Pain in thoracic spine: Secondary | ICD-10-CM

## 2020-02-02 DIAGNOSIS — Z87891 Personal history of nicotine dependence: Secondary | ICD-10-CM | POA: Insufficient documentation

## 2020-02-02 MED ORDER — HYDROCODONE-ACETAMINOPHEN 7.5-325 MG PO TABS: 1.0000 | ORAL_TABLET | Freq: Four times a day (QID) | ORAL | 0 refills | Status: DC | PRN

## 2020-02-02 NOTE — Patient Instructions (Signed)
COVID-19 Vaccine Information can be found at: [email or web address] For questions related to vaccine distribution or appointments, please email [email or web address] or call [phone].    

## 2020-02-02 NOTE — Progress Notes (Signed)
Subjective:    Patient ID: Shannon French, female    DOB: 1966/04/04, 54 y.o.   MRN: [id number]  HPI: Shannon French is a 54 y.o. female who returns for follow up appointment for chronic pain and medication refill. She states her pain is located in her neck, bilateral hands, she states she was diagnosed with Psoriasis by Dr Dierdre Forth, left shoulder pain and mid- back pain. Also reports generalized joint pain. She rates her pain 5. Her current exercise regime is walking 11/2 mile daily, walking, swimming and performing stretching exercises.  Shannon French Morphine equivalent is 30.00 MME.    Last Oral Swab was Performed on 09/28/2019, it was consistent.   Pain Inventory Average Pain 5 Pain Right Now 5 My pain is constant, sharp, burning, dull, stabbing, tingling and aching  In the last 24 hours, has pain interfered with the following? General activity 5 Relation with others 3 Enjoyment of life 4 What TIME of day is your pain at its worst? all Sleep (in general) Fair  Pain is worse with: walking, bending, sitting, inactivity, standing and some activites Pain improves with: rest, heat/ice, therapy/exercise, pacing activities and medication Relief from Meds: 5  Mobility walk without assistance how many minutes can you walk? 45 ability to climb steps?  yes do you drive?  yes  Function disabled: date disabled . I need assistance with the following:  meal prep, household duties and shopping  Neuro/Psych weakness numbness tingling spasms anxiety  Prior Studies Any changes since last visit?  no U/S and blood work  Physicians involved in your care Any changes since last visit?  no Rheumatologist Dr. Dierdre Forth Endocrinologist and GYN   Family History  Problem Relation Age of Onset  . Cancer Father    Social History   Socioeconomic History  . Marital status: Single    Spouse name: Not on file  . Number of children: Not on file  . Years of education: Not on file  . Highest  education level: Not on file  Occupational History  . Not on file  Tobacco Use  . Smoking status: Former Smoker    Quit date: 10/14/2009    Years since quitting: 10.3  . Smokeless tobacco: Never Used  Substance and Sexual Activity  . Alcohol use: No  . Drug use: No  . Sexual activity: Not on file  Other Topics Concern  . Not on file  Social History Narrative  . Not on file   Social Determinants of Health   Financial Resource Strain:   . Difficulty of Paying Living Expenses:   Food Insecurity:   . Worried About Programme researcher, broadcasting/film/video in the Last Year:   . Barista in the Last Year:   Transportation Needs:   . Freight forwarder (Medical):   Marland Kitchen Lack of Transportation (Non-Medical):   Physical Activity:   . Days of Exercise per Week:   . Minutes of Exercise per Session:   Stress:   . Feeling of Stress :   Social Connections:   . Frequency of Communication with Friends and Family:   . Frequency of Social Gatherings with Friends and Family:   . Attends Religious Services:   . Active Member of Clubs or Organizations:   . Attends Banker Meetings:   Marland Kitchen Marital Status:    Past Surgical History:  Procedure Laterality Date  . ABDOMINAL HYSTERECTOMY    . ABDOMINAL SURGERY    . APPENDECTOMY    . CHOLECYSTECTOMY    .  CYST EXCISION    . OVARY SURGERY     Past Medical History:  Diagnosis Date  . Arm pain    left  . Degenerative disc disease   . Diabetes mellitus   . Fibromyalgia   . Hyperlipidemia   . Hypertension   . IBS (irritable bowel syndrome)   . Osteoporosis    BP 117/79   Pulse 73   Temp 98.4 F (36.9 C)   Ht 5\' 3"  (1.6 m)   Wt 150 lb (68 kg)   SpO2 98%   BMI 26.57 kg/m   Opioid Risk Score:   Fall Risk Score:  `1  Depression screen PHQ 2/9  Depression screen [facility] 2/9 06/01/2019 11/04/2018 09/08/2018 07/08/2018 07/22/2017 11/12/2016 08/12/2016  Decreased Interest 0 0 0 0 0 0 0  Down, Depressed, Hopeless - 0 0 0 0 0 0  PHQ - 2 Score 0 0 0  0 0 0 0  Altered sleeping - - - - - - -  Tired, decreased energy - - - - - - -  Change in appetite - - - - - - -  Feeling bad or failure about yourself  - - - - - - -  Trouble concentrating - - - - - - -  Moving slowly or fidgety/restless - - - - - - -  Suicidal thoughts - - - - - - -  PHQ-9 Score - - - - - - -    Review of Systems  Neurological: Positive for weakness and numbness.  All other systems reviewed and are negative.      Objective:   Physical Exam Vitals and nursing note reviewed.  Constitutional:      Appearance: Normal appearance.  Neck:     Comments: Cervical Paraspinal Tenderness: C-5-C-6 Cardiovascular:     Rate and Rhythm: Normal rate and regular rhythm.     Pulses: Normal pulses.     Heart sounds: Normal heart sounds.  Pulmonary:     Effort: Pulmonary effort is normal.     Breath sounds: Normal breath sounds.  Musculoskeletal:     Cervical back: Normal range of motion and neck supple.     Comments: Normal Muscle Bulk and Muscle Testing Reveals:  Upper Extremities: Full ROM and Muscle Strength 5/5 Left AC Joint Tenderness Thoracic Paraspinal Tenderness: T-7-T-9  Lower Extremities: Full ROM and Muscle Strength 5/5  Arises from Chair with Ease Narrow Based Gait  Skin:    General: Skin is warm and dry.  Neurological:     Mental Status: She is alert and oriented to person, place, and time.  Psychiatric:        Mood and Affect: Mood normal.        Behavior: Behavior normal.           Assessment & Plan:  1. Bilateral sacroiliac joint dysfunction:02/02/2020. Refilled:HYDROcodone 7.5/325mg  one tablet every 6 hrs as needed # 120.Second script sent for the following month. We will continue the opioid monitoring program, this consists of regular clinic visits, examinations, urine drug screen pill counts as well as use of 02/04/2020 Controlled Substance reporting System. 2. Chronic pain syndrome consistent with fibromyalgia. Continue with exercise  regime, heat therapy,voltaren gel, lidocaine patches and topamax. Continue current analgesics .02/02/2020. 3. Lumbosacral Spondylosis/ Bilaterallumbar radiculopathy:Continue Elavil and Cymbalta.02/02/2020, 4. Complex Regional Pain Syndrome: Continue Cymbalta..Dr. 02/04/2020 Following. 02/02/2020 5. Type 2 diabetes.: PCPFollowing.02/02/2020. 6. Muscle Spasms: Continue Tizanidine 4 mg one tablet every 8 hours as needed for muscle spasms.02/02/2020  7. Insomnia: Continue Elavil.02/02/2020 8.Cervical Radiculopathy: Continue Elavil:02/02/2020 9. Cervicalgia/ Cervical Inflammation:Continue current medication regime. Alternate with heat and Ice Therapy.02/02/2020 10. Bilateral Thoracic Pain:Continue HEP as Tolerated.Continue current medication regime. Continue to Monitor.02/02/2020 11. Left Shoulder Pain:Continue with HEP as Tolerated. Continue to Alternate Ice and Heat. Continue to monitor.Ortho Following.02/02/2020.  F/U in 2 months  of face to face patient care time was spent during this visit. All questions were encouraged and answered.

## 2020-03-28 ENCOUNTER — Encounter: Payer: Medicare Other | Attending: Physical Medicine & Rehabilitation | Admitting: Registered Nurse

## 2020-03-28 ENCOUNTER — Other Ambulatory Visit: Payer: Self-pay

## 2020-03-28 ENCOUNTER — Encounter: Payer: Self-pay | Admitting: Registered Nurse

## 2020-03-28 VITALS — BP 125/87 | HR 87 | Temp 98.7°F | Ht 63.0 in | Wt 150.0 lb

## 2020-03-28 DIAGNOSIS — K589 Irritable bowel syndrome without diarrhea: Secondary | ICD-10-CM | POA: Insufficient documentation

## 2020-03-28 DIAGNOSIS — Z5181 Encounter for therapeutic drug level monitoring: Secondary | ICD-10-CM | POA: Insufficient documentation

## 2020-03-28 DIAGNOSIS — M542 Cervicalgia: Secondary | ICD-10-CM | POA: Diagnosis present

## 2020-03-28 DIAGNOSIS — M810 Age-related osteoporosis without current pathological fracture: Secondary | ICD-10-CM | POA: Diagnosis not present

## 2020-03-28 DIAGNOSIS — G894 Chronic pain syndrome: Secondary | ICD-10-CM

## 2020-03-28 DIAGNOSIS — Z9889 Other specified postprocedural states: Secondary | ICD-10-CM | POA: Diagnosis not present

## 2020-03-28 DIAGNOSIS — M25562 Pain in left knee: Secondary | ICD-10-CM

## 2020-03-28 DIAGNOSIS — G9050 Complex regional pain syndrome I, unspecified: Secondary | ICD-10-CM | POA: Insufficient documentation

## 2020-03-28 DIAGNOSIS — Z79891 Long term (current) use of opiate analgesic: Secondary | ICD-10-CM | POA: Insufficient documentation

## 2020-03-28 DIAGNOSIS — M546 Pain in thoracic spine: Secondary | ICD-10-CM | POA: Diagnosis not present

## 2020-03-28 DIAGNOSIS — M2550 Pain in unspecified joint: Secondary | ICD-10-CM

## 2020-03-28 DIAGNOSIS — M25561 Pain in right knee: Secondary | ICD-10-CM

## 2020-03-28 DIAGNOSIS — M5010 Cervical disc disorder with radiculopathy, unspecified cervical region: Secondary | ICD-10-CM | POA: Diagnosis not present

## 2020-03-28 DIAGNOSIS — M609 Myositis, unspecified: Secondary | ICD-10-CM | POA: Diagnosis not present

## 2020-03-28 DIAGNOSIS — Z90710 Acquired absence of both cervix and uterus: Secondary | ICD-10-CM | POA: Insufficient documentation

## 2020-03-28 DIAGNOSIS — E119 Type 2 diabetes mellitus without complications: Secondary | ICD-10-CM | POA: Diagnosis not present

## 2020-03-28 DIAGNOSIS — M461 Sacroiliitis, not elsewhere classified: Secondary | ICD-10-CM | POA: Insufficient documentation

## 2020-03-28 DIAGNOSIS — M79642 Pain in left hand: Secondary | ICD-10-CM

## 2020-03-28 DIAGNOSIS — Z87891 Personal history of nicotine dependence: Secondary | ICD-10-CM | POA: Diagnosis not present

## 2020-03-28 DIAGNOSIS — M47817 Spondylosis without myelopathy or radiculopathy, lumbosacral region: Secondary | ICD-10-CM | POA: Diagnosis not present

## 2020-03-28 DIAGNOSIS — Z809 Family history of malignant neoplasm, unspecified: Secondary | ICD-10-CM | POA: Insufficient documentation

## 2020-03-28 DIAGNOSIS — M797 Fibromyalgia: Secondary | ICD-10-CM | POA: Insufficient documentation

## 2020-03-28 DIAGNOSIS — I10 Essential (primary) hypertension: Secondary | ICD-10-CM | POA: Diagnosis not present

## 2020-03-28 DIAGNOSIS — Z79899 Other long term (current) drug therapy: Secondary | ICD-10-CM | POA: Diagnosis present

## 2020-03-28 DIAGNOSIS — M25512 Pain in left shoulder: Secondary | ICD-10-CM | POA: Diagnosis not present

## 2020-03-28 DIAGNOSIS — E785 Hyperlipidemia, unspecified: Secondary | ICD-10-CM | POA: Insufficient documentation

## 2020-03-28 DIAGNOSIS — Z9049 Acquired absence of other specified parts of digestive tract: Secondary | ICD-10-CM | POA: Diagnosis not present

## 2020-03-28 DIAGNOSIS — M79641 Pain in right hand: Secondary | ICD-10-CM

## 2020-03-28 DIAGNOSIS — G8929 Other chronic pain: Secondary | ICD-10-CM

## 2020-03-28 DIAGNOSIS — M5416 Radiculopathy, lumbar region: Secondary | ICD-10-CM | POA: Insufficient documentation

## 2020-03-28 MED ORDER — HYDROCODONE-ACETAMINOPHEN 7.5-325 MG PO TABS: 1.0000 | ORAL_TABLET | Freq: Four times a day (QID) | ORAL | 0 refills | Status: DC | PRN

## 2020-03-28 NOTE — Progress Notes (Addendum)
Subjective:    Patient ID: Shannon French, female    DOB: [birth year], 54 y.o.   MRN: [id number]  HPI: Shannon French is a 54 y.o. female who returns for follow up appointment for chronic pain and medication refill. She states her pain is located in her neck, mid- lower back pain and bilateral knee pain. Also reports generalized joint pain. She rates her pain 8. Her current exercise regime is walking and performing stretching exercises.  Shannon French Morphine equivalent is 30.00 MME.    Last Oral Swab was Performed on 09/28/2019, it was consistent.   Pain Inventory Average Pain 8 Pain Right Now 8 My pain is constant, sharp, burning, dull, stabbing, tingling and aching  In the last 24 hours, has pain interfered with the following? General activity 8 Relation with others 8 Enjoyment of life 8 What TIME of day is your pain at its worst? morning , daytime, evening and night Sleep (in general) Fair  Pain is worse with: walking, bending, sitting, inactivity, standing and some activites Pain improves with: rest, heat/ice, therapy/exercise, pacing activities and medication Relief from Meds: 7  Family History  Problem Relation Age of Onset  . Cancer Father    Social History   Socioeconomic History  . Marital status: Single    Spouse name: Not on file  . Number of children: Not on file  . Years of education: Not on file  . Highest education level: Not on file  Occupational History  . Not on file  Tobacco Use  . Smoking status: Former Smoker    Quit date: 10/14/2009    Years since quitting: 10.4  . Smokeless tobacco: Never Used  Substance and Sexual Activity  . Alcohol use: No  . Drug use: No  . Sexual activity: Not on file  Other Topics Concern  . Not on file  Social History Narrative  . Not on file   Social Determinants of Health   Financial Resource Strain:   . Difficulty of Paying Living Expenses: Not on file  Food Insecurity:   . Worried About Programme researcher, broadcasting/film/video in the  Last Year: Not on file  . Ran Out of Food in the Last Year: Not on file  Transportation Needs:   . Lack of Transportation (Medical): Not on file  . Lack of Transportation (Non-Medical): Not on file  Physical Activity:   . Days of Exercise per Week: Not on file  . Minutes of Exercise per Session: Not on file  Stress:   . Feeling of Stress : Not on file  Social Connections:   . Frequency of Communication with Friends and Family: Not on file  . Frequency of Social Gatherings with Friends and Family: Not on file  . Attends Religious Services: Not on file  . Active Member of Clubs or Organizations: Not on file  . Attends Banker Meetings: Not on file  . Marital Status: Not on file   Past Surgical History:  Procedure Laterality Date  . ABDOMINAL HYSTERECTOMY    . ABDOMINAL SURGERY    . APPENDECTOMY    . CHOLECYSTECTOMY    . CYST EXCISION    . OVARY SURGERY     Past Surgical History:  Procedure Laterality Date  . ABDOMINAL HYSTERECTOMY    . ABDOMINAL SURGERY    . APPENDECTOMY    . CHOLECYSTECTOMY    . CYST EXCISION    . OVARY SURGERY     Past Medical History:  Diagnosis Date  .  Arm pain    left  . Degenerative disc disease   . Diabetes mellitus   . Fibromyalgia   . Hyperlipidemia   . Hypertension   . IBS (irritable bowel syndrome)   . Osteoporosis    BP 125/87   Pulse 87   Temp 98.7 F (37.1 C)   Ht 5\' 3"  (1.6 m)   Wt 150 lb (68 kg)   SpO2 97%   BMI 26.57 kg/m   Opioid Risk Score:   Fall Risk Score:  `1  Depression screen PHQ 2/9  Depression screen [facility] 2/9 02/02/2020 06/01/2019 11/04/2018 09/08/2018 07/08/2018 07/22/2017 11/12/2016  Decreased Interest 0 0 0 0 0 0 0  Down, Depressed, Hopeless 0 - 0 0 0 0 0  PHQ - 2 Score 0 0 0 0 0 0 0  Altered sleeping - - - - - - -  Tired, decreased energy - - - - - - -  Change in appetite - - - - - - -  Feeling bad or failure about yourself  - - - - - - -  Trouble concentrating - - - - - - -  Moving slowly or  fidgety/restless - - - - - - -  Suicidal thoughts - - - - - - -  PHQ-9 Score - - - - - - -     Review of Systems  Musculoskeletal: Positive for arthralgias, back pain and neck pain.       Spasms  Neurological: Positive for tremors, weakness and numbness.       Tingling  Psychiatric/Behavioral: The patient is nervous/anxious.   All other systems reviewed and are negative.      Objective:   Physical Exam Vitals and nursing note reviewed.  Constitutional:      Appearance: Normal appearance.  Neck:     Comments: Cervical Paraspinal Tenderness: C-5-C-6 Cardiovascular:     Rate and Rhythm: Normal rate and regular rhythm.     Pulses: Normal pulses.     Heart sounds: Normal heart sounds.  Pulmonary:     Effort: Pulmonary effort is normal.     Breath sounds: Normal breath sounds.  Musculoskeletal:     Cervical back: Normal range of motion and neck supple.     Comments: Normal Muscle Bulk and Muscle Testing Reveals:  Upper Extremities: Full ROM and Muscle Strength 5/5 Left AC Joint Tenderness  Thoracic Hypersensitivity: T-1-T-3 T-7-T-9  Lumbar Hypersensitivity Lower Extremities: Full ROM and Muscle Strength 5/5 Arises from Chair with ease Narrow Based Gait   Skin:    General: Skin is warm and dry.  Neurological:     Mental Status: She is alert and oriented to person, place, and time.  Psychiatric:        Mood and Affect: Mood normal.        Behavior: Behavior normal.           Assessment & Plan:  1. Bilateral sacroiliac joint dysfunction:03/28/2020. Refilled:HYDROcodone 7.5/325mg  one tablet every 6 hrs as needed # 120.Second script sent for the following month. We will continue the opioid monitoring program, this consists of regular clinic visits, examinations, urine drug screen, pill counts as well as use of 03/30/2020 Controlled Substance Reporting system. A 12 month History has been reviewed on the [place] Controlled Substance Reporting System on  03/28/2020.  2. Chronic pain syndrome consistent with fibromyalgia. Continue with exercise regime, heat therapy,voltaren gel, lidocaine patches and topamax. Continue current analgesics .03/28/2020. 3. Lumbosacral Spondylosis/ Bilaterallumbar radiculopathy:Continue Elavil  and Cymbalta.03/28/2020, 4. Complex Regional Pain Syndrome: Continue Cymbalta..Dr. Yolanda Bonine Following. 03/28/2020 5. Type 2 diabetes.: PCPFollowing.03/28/2020. 6. Muscle Spasms: Continue Tizanidine 4 mg one tablet every 8 hours as needed for muscle spasms.03/28/2020 7. Insomnia: Continue Elavil.Continue to monitor. 03/28/2020 8.Cervical Radiculopathy:No complaints today.  Continue Elavil:Continue to monitor. 03/28/2020 9. Cervicalgia/ Cervical Inflammation:Continue current medication regime. Alternate with heat and Ice Therapy.03/28/2020 10. Bilateral Thoracic Pain:Continue HEP as Tolerated.Continue current medication regime. Continue to Monitor.03/28/2020 11. Left Shoulder Pain:No complaints today. Continue with HEP as Tolerated. Continue to Alternate Ice and Heat. Continue to monitor.Ortho Following.03/28/2020.  F/U in 2 months  of face to face patient care time was spent during this visit. All questions were encouraged and answered.

## 2020-04-07 DIAGNOSIS — E0590 Thyrotoxicosis, unspecified without thyrotoxic crisis or storm: Secondary | ICD-10-CM | POA: Insufficient documentation

## 2020-05-30 ENCOUNTER — Encounter: Payer: Self-pay | Admitting: Registered Nurse

## 2020-05-30 ENCOUNTER — Encounter: Payer: Medicare Other | Attending: Physical Medicine & Rehabilitation | Admitting: Registered Nurse

## 2020-05-30 ENCOUNTER — Other Ambulatory Visit: Payer: Self-pay

## 2020-05-30 VITALS — BP 120/83 | HR 90 | Temp 98.6°F | Ht 63.0 in | Wt 146.8 lb

## 2020-05-30 DIAGNOSIS — M546 Pain in thoracic spine: Secondary | ICD-10-CM | POA: Diagnosis present

## 2020-05-30 DIAGNOSIS — M47817 Spondylosis without myelopathy or radiculopathy, lumbosacral region: Secondary | ICD-10-CM | POA: Diagnosis present

## 2020-05-30 DIAGNOSIS — M79641 Pain in right hand: Secondary | ICD-10-CM | POA: Insufficient documentation

## 2020-05-30 DIAGNOSIS — Z5181 Encounter for therapeutic drug level monitoring: Secondary | ICD-10-CM | POA: Diagnosis present

## 2020-05-30 DIAGNOSIS — M2550 Pain in unspecified joint: Secondary | ICD-10-CM | POA: Insufficient documentation

## 2020-05-30 DIAGNOSIS — M542 Cervicalgia: Secondary | ICD-10-CM | POA: Diagnosis present

## 2020-05-30 DIAGNOSIS — G894 Chronic pain syndrome: Secondary | ICD-10-CM | POA: Diagnosis present

## 2020-05-30 DIAGNOSIS — M79642 Pain in left hand: Secondary | ICD-10-CM | POA: Diagnosis present

## 2020-05-30 DIAGNOSIS — Z79891 Long term (current) use of opiate analgesic: Secondary | ICD-10-CM | POA: Insufficient documentation

## 2020-05-30 MED ORDER — HYDROCODONE-ACETAMINOPHEN 7.5-325 MG PO TABS: 1.0000 | ORAL_TABLET | Freq: Four times a day (QID) | ORAL | 0 refills | Status: DC | PRN

## 2020-05-30 NOTE — Progress Notes (Signed)
Subjective:    Patient ID: Shannon French, female    DOB: 1965-12-25, 54 y.o.   MRN: [id number]  HPI: Shannon French is a 54 y.o. female who returns for follow up appointment for chronic pain and medication refill. She states her pain is located in her neck, bilateral hands and mid- back pain. She rates her pain 5. Her current exercise regime is walking and performing stretching exercises.  Ms. Rigdon Morphine equivalent is 30.00  MME.   Last Oral Swab was Performed on 09/28/2019, it was consistent.    Pain Inventory Average Pain 5 Pain Right Now 5 My pain is constant, sharp, burning, dull, stabbing, tingling and aching  In the last 24 hours, has pain interfered with the following? General activity 8 Relation with others 8 Enjoyment of life 8 What TIME of day is your pain at its worst? morning , daytime, evening and night Sleep (in general) Fair  Pain is worse with: walking, bending, sitting, inactivity, standing and some activites Pain improves with: rest, heat/ice, therapy/exercise, pacing activities and medication Relief from Meds: 5  Family History  Problem Relation Age of Onset  . Cancer Father    Social History   Socioeconomic History  . Marital status: Single    Spouse name: Not on file  . Number of children: Not on file  . Years of education: Not on file  . Highest education level: Not on file  Occupational History  . Not on file  Tobacco Use  . Smoking status: Former Smoker    Quit date: 10/14/2009    Years since quitting: 10.6  . Smokeless tobacco: Never Used  Substance and Sexual Activity  . Alcohol use: No  . Drug use: No  . Sexual activity: Not on file  Other Topics Concern  . Not on file  Social History Narrative  . Not on file   Social Determinants of Health   Financial Resource Strain:   . Difficulty of Paying Living Expenses: Not on file  Food Insecurity:   . Worried About Programme researcher, broadcasting/film/video in the Last Year: Not on file  . Ran Out of Food in  the Last Year: Not on file  Transportation Needs:   . Lack of Transportation (Medical): Not on file  . Lack of Transportation (Non-Medical): Not on file  Physical Activity:   . Days of Exercise per Week: Not on file  . Minutes of Exercise per Session: Not on file  Stress:   . Feeling of Stress : Not on file  Social Connections:   . Frequency of Communication with Friends and Family: Not on file  . Frequency of Social Gatherings with Friends and Family: Not on file  . Attends Religious Services: Not on file  . Active Member of Clubs or Organizations: Not on file  . Attends Banker Meetings: Not on file  . Marital Status: Not on file   Past Surgical History:  Procedure Laterality Date  . ABDOMINAL HYSTERECTOMY    . ABDOMINAL SURGERY    . APPENDECTOMY    . CHOLECYSTECTOMY    . CYST EXCISION    . OVARY SURGERY     Past Surgical History:  Procedure Laterality Date  . ABDOMINAL HYSTERECTOMY    . ABDOMINAL SURGERY    . APPENDECTOMY    . CHOLECYSTECTOMY    . CYST EXCISION    . OVARY SURGERY     Past Medical History:  Diagnosis Date  . Arm pain  left  . Degenerative disc disease   . Diabetes mellitus   . Fibromyalgia   . Hyperlipidemia   . Hypertension   . IBS (irritable bowel syndrome)   . Osteoporosis    BP 120/83   Pulse 90   Temp 98.6 F (37 C)   Ht 5\' 3"  (1.6 m)   Wt 146 lb 12.8 oz (66.6 kg)   SpO2 96%   BMI 26.00 kg/m   Opioid Risk Score:   Fall Risk Score:  `1  Depression screen PHQ 2/9  Depression screen [facility] 2/9 02/02/2020 06/01/2019 11/04/2018 09/08/2018 07/08/2018 07/22/2017 11/12/2016  Decreased Interest 0 0 0 0 0 0 0  Down, Depressed, Hopeless 0 - 0 0 0 0 0  PHQ - 2 Score 0 0 0 0 0 0 0  Altered sleeping - - - - - - -  Tired, decreased energy - - - - - - -  Change in appetite - - - - - - -  Feeling bad or failure about yourself  - - - - - - -  Trouble concentrating - - - - - - -  Moving slowly or fidgety/restless - - - - - - -    Suicidal thoughts - - - - - - -  PHQ-9 Score - - - - - - -    Review of Systems  Musculoskeletal: Positive for back pain.       Hand pain spasms  Neurological: Positive for tremors, weakness and numbness.       Tingling  All other systems reviewed and are negative.      Objective:   Physical Exam Vitals and nursing note reviewed.  Constitutional:      Appearance: Normal appearance.  Cardiovascular:     Rate and Rhythm: Normal rate and regular rhythm.     Pulses: Normal pulses.     Heart sounds: Normal heart sounds.  Pulmonary:     Effort: Pulmonary effort is normal.     Breath sounds: Normal breath sounds.  Musculoskeletal:     Cervical back: Normal range of motion and neck supple.     Comments: Normal Muscle Bulk and Muscle Testing Reveals:  Upper Extremities: Full ROM and Muscle Strength 5/5 Thoracic Paraspinal Tenderness: T-7-T-9 Lower Extremities: Full ROM and Muscle Strength 5/5 Arises from chair with ease Narrow Based  Gait   Skin:    General: Skin is warm and dry.  Neurological:     Mental Status: She is alert and oriented to person, place, and time.  Psychiatric:        Mood and Affect: Mood normal.        Behavior: Behavior normal.           Assessment & Plan:  1. Bilateral sacroiliac joint dysfunction:05/30/2020. Refilled:HYDROcodone 7.5/325mg  one tablet every 6 hrs as needed # 120.Second script sent for the following month. We will continue the opioid monitoring program, this consists of regular clinic visits, examinations, urine drug screen, pill counts as well as use of 06/01/2020 Controlled Substance Reporting system. A 12 month History has been reviewed on the [place] Controlled Substance Reporting System on 05/30/2020.  2. Chronic pain syndrome consistent with fibromyalgia. Continue with exercise regime, heat therapy,voltaren gel, lidocaine patches and topamax. Continue current analgesics .05/30/2020. 3. Lumbosacral Spondylosis/  Bilaterallumbar radiculopathy:Continue Elavil and Cymbalta.05/30/2020, 4. Complex Regional Pain Syndrome: Continue Cymbalta..Dr. 06/01/2020 Following. 05/30/2020 5. Type 2 diabetes.: PCPFollowing.05/30/2020. 6. Muscle Spasms: Continue Tizanidine 4 mg one tablet every 8 hours  as needed for muscle spasms.05/30/2020 7. Insomnia: Continue Elavil.Continue to monitor. 05/30/2020 8.Cervical Radiculopathy:No complaints today.  Continue Elavil:Continue to monitor. 05/30/2020 9. Cervicalgia/ Cervical Inflammation:Continue current medication regime. Alternate with heat and Ice Therapy.05/30/2020 10. Bilateral Thoracic Pain:Continue HEP as Tolerated.Continue current medication regime. Continue to Monitor.05/30/2020 11. Left Shoulder Pain:No complaints today. Continue with HEP as Tolerated. Continue to Alternate Ice and Heat. Continue to monitor.Ortho Following.05/30/2020.  F/U in 2 months  of face to face patient care time was spent during this visit. All questions were encouraged and answered.

## 2020-06-08 ENCOUNTER — Other Ambulatory Visit: Payer: Self-pay | Admitting: Registered Nurse

## 2020-07-24 ENCOUNTER — Telehealth: Payer: Self-pay

## 2020-07-24 NOTE — Telephone Encounter (Signed)
Mrs. Shannon French will need to cancel the appointment on 07/26/2020. Unless there is another options maybe video, or telephone visit.   Mrs. Shannon French daughter received a double transplant at [facility] And the family members have to limit their exposure for her immune system. So Mrs. Shannon French can not come into the office.  Call back phone [phone]. Please advise.   Thank you

## 2020-07-26 ENCOUNTER — Encounter: Payer: Medicare Other | Attending: Physical Medicine & Rehabilitation | Admitting: Registered Nurse

## 2020-07-26 ENCOUNTER — Other Ambulatory Visit: Payer: Self-pay

## 2020-07-26 ENCOUNTER — Encounter: Payer: Self-pay | Admitting: Registered Nurse

## 2020-07-26 VITALS — BP 124/87 | Temp 98.4°F | Ht 64.0 in | Wt 141.0 lb

## 2020-07-26 DIAGNOSIS — M2550 Pain in unspecified joint: Secondary | ICD-10-CM | POA: Insufficient documentation

## 2020-07-26 DIAGNOSIS — G894 Chronic pain syndrome: Secondary | ICD-10-CM | POA: Insufficient documentation

## 2020-07-26 DIAGNOSIS — Z79891 Long term (current) use of opiate analgesic: Secondary | ICD-10-CM | POA: Insufficient documentation

## 2020-07-26 DIAGNOSIS — Z5181 Encounter for therapeutic drug level monitoring: Secondary | ICD-10-CM | POA: Insufficient documentation

## 2020-07-26 DIAGNOSIS — M542 Cervicalgia: Secondary | ICD-10-CM | POA: Diagnosis not present

## 2020-07-26 DIAGNOSIS — M546 Pain in thoracic spine: Secondary | ICD-10-CM | POA: Insufficient documentation

## 2020-07-26 DIAGNOSIS — M79642 Pain in left hand: Secondary | ICD-10-CM

## 2020-07-26 DIAGNOSIS — M79641 Pain in right hand: Secondary | ICD-10-CM | POA: Insufficient documentation

## 2020-07-26 DIAGNOSIS — M47817 Spondylosis without myelopathy or radiculopathy, lumbosacral region: Secondary | ICD-10-CM | POA: Diagnosis not present

## 2020-07-26 DIAGNOSIS — G609 Hereditary and idiopathic neuropathy, unspecified: Secondary | ICD-10-CM

## 2020-07-26 MED ORDER — HYDROCODONE-ACETAMINOPHEN 7.5-325 MG PO TABS: 1.0000 | ORAL_TABLET | Freq: Four times a day (QID) | ORAL | 0 refills | Status: DC | PRN

## 2020-07-26 NOTE — Progress Notes (Signed)
Subjective:    Patient ID: Shannon French, female    DOB: [birth year], 54 y.o.   MRN: [id number]  HPI: Shannon French is a 54 y.o. female whose appointment was changed to a telephone visit, she called office stating her daughter recently  had a pancreas and kidney transplant. They are trying to limit exposure with the public.Her appointment was changed to a telephone visit, the telephone visit will also provide continuity of care. Shannon French agrees with telephone visit and verbalizes understanding. She  states her pain is located in her neck, upper- lower back and bilateral feet. She also reports she has  pain is in her bilateral hands. She rates her pain 6. Her current exercise regime is walking and performing stretching exercises.  Shannon French Morphine equivalent is 30.00 MME.  Last Oral Swab was performed on 09/28/2019, it was consistent.   Silas Sacramento CMA asked The Health and History Questions. This provider and Jamie Brookes verified we were speaking with the correct person using two identifiers.   Pain Inventory Average Pain 5 Pain Right Now 6 My pain is sharp, burning, dull, stabbing, tingling and aching  In the last 24 hours, has pain interfered with the following? General activity 0 Relation with others 0 Enjoyment of life 0 What TIME of day is your pain at its worst? morning , daytime, evening and night Sleep (in general) Fair  Pain is worse with: walking, bending, sitting, inactivity, standing and some activites Pain improves with: rest, heat/ice, therapy/exercise, pacing activities and medication Relief from Meds: 4  Family History  Problem Relation Age of Onset  . Cancer Father    Social History   Socioeconomic History  . Marital status: Single    Spouse name: Not on file  . Number of children: Not on file  . Years of education: Not on file  . Highest education level: Not on file  Occupational History  . Not on file  Tobacco Use  . Smoking status: Former Smoker    Quit  date: 10/14/2009    Years since quitting: 10.7  . Smokeless tobacco: Never Used  Substance and Sexual Activity  . Alcohol use: No  . Drug use: No  . Sexual activity: Not on file  Other Topics Concern  . Not on file  Social History Narrative  . Not on file   Social Determinants of Health   Financial Resource Strain: Not on file  Food Insecurity: Not on file  Transportation Needs: Not on file  Physical Activity: Not on file  Stress: Not on file  Social Connections: Not on file   Past Surgical History:  Procedure Laterality Date  . ABDOMINAL HYSTERECTOMY    . ABDOMINAL SURGERY    . APPENDECTOMY    . CHOLECYSTECTOMY    . CYST EXCISION    . OVARY SURGERY     Past Surgical History:  Procedure Laterality Date  . ABDOMINAL HYSTERECTOMY    . ABDOMINAL SURGERY    . APPENDECTOMY    . CHOLECYSTECTOMY    . CYST EXCISION    . OVARY SURGERY     Past Medical History:  Diagnosis Date  . Arm pain    left  . Degenerative disc disease   . Diabetes mellitus   . Fibromyalgia   . Hyperlipidemia   . Hypertension   . IBS (irritable bowel syndrome)   . Osteoporosis    BP 124/87   Temp 98.4 F (36.9 C)   Ht 5\' 4"  (1.626 m)  Wt 141 lb (64 kg)   BMI 24.20 kg/m   Opioid Risk Score:   Fall Risk Score:  `1  Depression screen PHQ 2/9  Depression screen [facility] 2/9 02/02/2020 06/01/2019 11/04/2018 09/08/2018 07/08/2018 07/22/2017 11/12/2016  Decreased Interest 0 0 0 0 0 0 0  Down, Depressed, Hopeless 0 - 0 0 0 0 0  PHQ - 2 Score 0 0 0 0 0 0 0  Altered sleeping - - - - - - -  Tired, decreased energy - - - - - - -  Change in appetite - - - - - - -  Feeling bad or failure about yourself  - - - - - - -  Trouble concentrating - - - - - - -  Moving slowly or fidgety/restless - - - - - - -  Suicidal thoughts - - - - - - -  PHQ-9 Score - - - - - - -    Review of Systems  Musculoskeletal: Positive for arthralgias, back pain, myalgias and neck pain.       Skin Hip pain  All other systems  reviewed and are negative.      Objective:   Physical Exam Vitals and nursing note reviewed.  Musculoskeletal:     Comments: No Physical Exam Performed: Telephone Visit           Assessment & Plan:  1. Bilateral sacroiliac joint dysfunction:07/26/2020. Refilled:HYDROcodone 7.5/325mg  one tablet every 6 hrs as needed # 120.Second script sent for the following month. We will continue the opioid monitoring program, this consists of regular clinic visits, examinations, urine drug screen, pill counts as well as use of [place] Controlled Substance Reporting system. A 12 month History has been reviewed on the [place] Controlled Substance Reporting Systemon 07/26/2020. 2. Chronic pain syndrome consistent with fibromyalgia. Continue with exercise regime, heat therapy,voltaren gel, lidocaine patches and topamax. Continue current analgesics .07/26/2020. 3. Lumbosacral Spondylosis/ Bilaterallumbar radiculopathy:Continue Elavil and Cymbalta.07/26/2020, 4. Complex Regional Pain Syndrome: Continue Cymbalta..Dr. Yolanda Bonine Following. 07/26/2020 5. Type 2 diabetes.: PCPFollowing.07/26/2020. 6. Muscle Spasms: Continue Tizanidine 4 mg one tablet every 8 hours as needed for muscle spasms.07/26/2020 7. Insomnia: Continue Elavil.Continue to monitor.07/26/2020 8.Cervical Radiculopathy:No complaints today.Continue Elavil:Continue to monitor.07/26/2020 9. Cervicalgia/ Cervical Inflammation:Continue current medication regime. Alternate with heat and Ice Therapy.07/26/2020 10. Bilateral Thoracic Pain:Continue HEP as Tolerated.Continue current medication regime. Continue to Monitor.07/26/2020 11. Left Shoulder Pain:No complaints today.Continue with HEP as Tolerated. Continue to Alternate Ice and Heat. Continue to monitor.Ortho Following.07/26/2020.  F/U in 2 months  Telephone Visit Established Patient Location of Patient: In her Home Location of Provider: In the  Office Total Time Spent: 12 Minutes

## 2020-09-19 ENCOUNTER — Other Ambulatory Visit: Payer: Self-pay

## 2020-09-19 ENCOUNTER — Encounter: Payer: Medicare Other | Attending: Physical Medicine & Rehabilitation | Admitting: Registered Nurse

## 2020-09-19 ENCOUNTER — Encounter: Payer: Self-pay | Admitting: Registered Nurse

## 2020-09-19 VITALS — BP 114/78 | HR 81 | Temp 98.5°F | Ht 64.0 in | Wt 142.8 lb

## 2020-09-19 DIAGNOSIS — M5412 Radiculopathy, cervical region: Secondary | ICD-10-CM | POA: Insufficient documentation

## 2020-09-19 DIAGNOSIS — M79641 Pain in right hand: Secondary | ICD-10-CM | POA: Diagnosis present

## 2020-09-19 DIAGNOSIS — G609 Hereditary and idiopathic neuropathy, unspecified: Secondary | ICD-10-CM | POA: Diagnosis present

## 2020-09-19 DIAGNOSIS — M542 Cervicalgia: Secondary | ICD-10-CM | POA: Diagnosis present

## 2020-09-19 DIAGNOSIS — M79642 Pain in left hand: Secondary | ICD-10-CM | POA: Diagnosis present

## 2020-09-19 DIAGNOSIS — M2550 Pain in unspecified joint: Secondary | ICD-10-CM | POA: Diagnosis present

## 2020-09-19 DIAGNOSIS — Z5181 Encounter for therapeutic drug level monitoring: Secondary | ICD-10-CM | POA: Diagnosis present

## 2020-09-19 DIAGNOSIS — M546 Pain in thoracic spine: Secondary | ICD-10-CM | POA: Insufficient documentation

## 2020-09-19 DIAGNOSIS — Z79891 Long term (current) use of opiate analgesic: Secondary | ICD-10-CM | POA: Diagnosis present

## 2020-09-19 DIAGNOSIS — G894 Chronic pain syndrome: Secondary | ICD-10-CM | POA: Insufficient documentation

## 2020-09-19 MED ORDER — HYDROCODONE-ACETAMINOPHEN 7.5-325 MG PO TABS: 1.0000 | ORAL_TABLET | Freq: Four times a day (QID) | ORAL | 0 refills | Status: DC | PRN

## 2020-09-19 NOTE — Progress Notes (Signed)
Subjective:    Patient ID: Shannon French, female    DOB: 1965/12/31, 55 y.o.   MRN: [id number]  HPI: Shannon French is a 55 y.o. female who returns for follow up appointment for chronic pain and medication refill. She states her  pain is located in her neck radiating into her left shoulder and Mid- back pain. Also reports bilateral hands and bilateral feet pain with tingling and numbness and generalize joint pain. She rates her pain 5. Her current exercise regime is walking and performing stretching exercises.  Shannon French reports she was diagnosed with Shingles on 09/01/2020 and Covid- 19 on 09/02/2020. PCP was following.   Shannon French French equivalent is 30.00 MME.  Oral Swab was Performed today.    Pain Inventory Average Pain 6 Pain Right Now 5 My pain is constant, sharp, burning, dull, stabbing, tingling and aching  In the last 24 hours, has pain interfered with the following? General activity 4 Relation with others 4 Enjoyment of life 4 What TIME of day is your pain at its worst? morning , daytime, evening, night and varies Sleep (in general) Fair  Pain is worse with: walking, bending, sitting, inactivity, standing and some activites Pain improves with: rest, heat/ice, therapy/exercise, pacing activities and medication Relief from Meds: 5  Family History  Problem Relation Age of Onset  . Cancer Father    Social History   Socioeconomic History  . Marital status: Single    Spouse name: Not on file  . Number of children: Not on file  . Years of education: Not on file  . Highest education level: Not on file  Occupational History  . Not on file  Tobacco Use  . Smoking status: Former Smoker    Quit date: 10/14/2009    Years since quitting: 10.9  . Smokeless tobacco: Never Used  Substance and Sexual Activity  . Alcohol use: No  . Drug use: No  . Sexual activity: Not on file  Other Topics Concern  . Not on file  Social History Narrative  . Not on file   Social  Determinants of Health   Financial Resource Strain: Not on file  Food Insecurity: Not on file  Transportation Needs: Not on file  Physical Activity: Not on file  Stress: Not on file  Social Connections: Not on file   Past Surgical History:  Procedure Laterality Date  . ABDOMINAL HYSTERECTOMY    . ABDOMINAL SURGERY    . APPENDECTOMY    . CHOLECYSTECTOMY    . CYST EXCISION    . OVARY SURGERY     Past Surgical History:  Procedure Laterality Date  . ABDOMINAL HYSTERECTOMY    . ABDOMINAL SURGERY    . APPENDECTOMY    . CHOLECYSTECTOMY    . CYST EXCISION    . OVARY SURGERY     Past Medical History:  Diagnosis Date  . Arm pain    left  . Degenerative disc disease   . Diabetes mellitus   . Fibromyalgia   . Hyperlipidemia   . Hypertension   . IBS (irritable bowel syndrome)   . Osteoporosis    There were no vitals taken for this visit.  Opioid Risk Score:   Fall Risk Score:  `1  Depression screen PHQ 2/9  Depression screen [facility] 2/9 02/02/2020 06/01/2019 11/04/2018 09/08/2018 07/08/2018 07/22/2017 11/12/2016  Decreased Interest 0 0 0 0 0 0 0  Down, Depressed, Hopeless 0 - 0 0 0 0 0  PHQ - 2 Score 0  0 0 0 0 0 0  Altered sleeping - - - - - - -  Tired, decreased energy - - - - - - -  Change in appetite - - - - - - -  Feeling bad or failure about yourself  - - - - - - -  Trouble concentrating - - - - - - -  Moving slowly or fidgety/restless - - - - - - -  Suicidal thoughts - - - - - - -  PHQ-9 Score - - - - - - -   Review of Systems  Musculoskeletal: Positive for back pain, gait problem and neck pain.       Hands, feet, left shoulder, right elbow  All other systems reviewed and are negative.      Objective:   Physical Exam Vitals and nursing note reviewed.  Constitutional:      Appearance: Normal appearance.  Neck:     Comments: Cervical Paraspinal Tenderness: C-5-C-6  Cardiovascular:     Rate and Rhythm: Normal rate and regular rhythm.     Pulses: Normal pulses.      Heart sounds: Normal heart sounds.  Pulmonary:     Effort: Pulmonary effort is normal.     Breath sounds: Normal breath sounds.  Musculoskeletal:     Cervical back: Normal range of motion and neck supple.     Comments: Normal Muscle Bulk and Muscle Testing Reveals:  Upper Extremities: Full ROM and Muscle Strength 5/5 Bilateral AC Joint Tenderness: L>R  Thoracic Paraspinal Tenderness: T-7-T-9 Lower Extremities: Full ROM and Muscle Strength 5/5 Arises from chair with ease Narrow Based  Gait   Skin:    General: Skin is warm and dry.  Neurological:     Mental Status: She is alert and oriented to person, place, and time.  Psychiatric:        Mood and Affect: Mood normal.        Behavior: Behavior normal.           Assessment & Plan:  1. Bilateral sacroiliac joint dysfunction:09/19/2020. Refilled:HYDROcodone 7.5/325mg  one tablet every 6 hrs as needed # 120.Second script sent for the following month. We will continue the opioid monitoring program, this consists of regular clinic visits, examinations, urine drug screen, pill counts as well as use of [place] Controlled Substance Reporting system. A 12 month History has been reviewed on the [place] Controlled Substance Reporting Systemon02/15/2022. 2. Chronic pain syndrome consistent with fibromyalgia. Continue with exercise regime, heat therapy,voltaren gel, lidocaine patches and topamax. Continue current analgesics .09/19/2020. 3. Lumbosacral Spondylosis/ Bilaterallumbar radiculopathy:Continue Elavil and Cymbalta.09/19/2020, 4. Complex Regional Pain Syndrome: Continue Cymbalta..Dr. Yolanda Bonine Following.09/19/2020 5. Type 2 diabetes.: PCPFollowing.09/19/2020 6. Muscle Spasms: Continue Tizanidine 4 mg one tablet every 8 hours as needed for muscle spasms.09/19/2020 7. Insomnia: Continue Elavil.Continue to monitor.07/26/2020 8.Cervical Radiculopathy:No complaints today.Continue Elavil:Continue to  monitor.09/19/2020 9. Cervicalgia/ Cervical Inflammation:Continue current medication regime. Alternate with heat and Ice Therapy.09/19/2020 10. Bilateral Thoracic Pain:Continue HEP as Tolerated.Continue current medication regime. Continue to Monitor.09/19/2020 11. Left Shoulder Pain:No complaints today.Continue with HEP as Tolerated. Continue to Alternate Ice and Heat. Continue to monitor.Ortho Following.09/19/2020. 12. Bilateral Hand Pain: Continue current medication regimen. Continue HEP as tolerated. Continue to monitor.  13. Polyarthralgia: Continue HEP as Tolerated. Continue to alternate Ice and Heat Therapy. Continue to monitor.   F/U in 2 months

## 2020-09-23 LAB — DRUG TOX MONITOR 1 W/CONF, ORAL FLD
Amphetamines: NEGATIVE ng/mL (ref <10)
Barbiturates: NEGATIVE ng/mL (ref <10)
Benzodiazepines: NEGATIVE ng/mL (ref <0.50)
Buprenorphine: NEGATIVE ng/mL (ref <0.10)
Cocaine: NEGATIVE ng/mL (ref <5.0)
Codeine: NEGATIVE ng/mL (ref <2.5)
Cotinine: 99 ng/mL — ABNORMAL HIGH (ref <5.0)
Dihydrocodeine: 6.9 ng/mL — ABNORMAL HIGH (ref <2.5)
Fentanyl: NEGATIVE ng/mL (ref <0.10)
Heroin Metabolite: NEGATIVE ng/mL (ref <1.0)
Hydrocodone: 97.1 ng/mL — ABNORMAL HIGH (ref <2.5)
Hydromorphone: NEGATIVE ng/mL (ref <2.5)
MARIJUANA: NEGATIVE ng/mL (ref <2.5)
MDMA: NEGATIVE ng/mL (ref <10)
Meprobamate: NEGATIVE ng/mL (ref <2.5)
Methadone: NEGATIVE ng/mL (ref <5.0)
Morphine: NEGATIVE ng/mL (ref <2.5)
Nicotine Metabolite: POSITIVE ng/mL — AB (ref <5.0)
Norhydrocodone: 11.4 ng/mL — ABNORMAL HIGH (ref <2.5)
Noroxycodone: NEGATIVE ng/mL (ref <2.5)
Opiates: POSITIVE ng/mL — AB (ref <2.5)
Oxycodone: NEGATIVE ng/mL (ref <2.5)
Oxymorphone: NEGATIVE ng/mL (ref <2.5)
Phencyclidine: NEGATIVE ng/mL (ref <10)
Tapentadol: NEGATIVE ng/mL (ref <5.0)
Tramadol: NEGATIVE ng/mL (ref <5.0)
Zolpidem: NEGATIVE ng/mL (ref <5.0)

## 2020-09-23 LAB — DRUG TOX ALC METAB W/CON, ORAL FLD: Alcohol Metabolite: NEGATIVE ng/mL (ref <25)

## 2020-09-27 ENCOUNTER — Telehealth: Payer: Self-pay | Admitting: *Deleted

## 2020-09-27 NOTE — Telephone Encounter (Signed)
Oral swab drug screen was consistent for prescribed medications.  ?

## 2020-11-16 ENCOUNTER — Encounter: Payer: Self-pay | Admitting: Registered Nurse

## 2020-11-16 ENCOUNTER — Encounter: Payer: Medicare Other | Attending: Physical Medicine & Rehabilitation | Admitting: Registered Nurse

## 2020-11-16 ENCOUNTER — Other Ambulatory Visit: Payer: Self-pay

## 2020-11-16 VITALS — BP 118/81 | HR 77 | Temp 98.3°F | Ht 64.0 in | Wt 138.4 lb

## 2020-11-16 DIAGNOSIS — G609 Hereditary and idiopathic neuropathy, unspecified: Secondary | ICD-10-CM | POA: Diagnosis present

## 2020-11-16 DIAGNOSIS — G894 Chronic pain syndrome: Secondary | ICD-10-CM | POA: Diagnosis present

## 2020-11-16 DIAGNOSIS — M546 Pain in thoracic spine: Secondary | ICD-10-CM | POA: Insufficient documentation

## 2020-11-16 DIAGNOSIS — Z5181 Encounter for therapeutic drug level monitoring: Secondary | ICD-10-CM | POA: Diagnosis present

## 2020-11-16 DIAGNOSIS — M2550 Pain in unspecified joint: Secondary | ICD-10-CM | POA: Diagnosis present

## 2020-11-16 DIAGNOSIS — Z79891 Long term (current) use of opiate analgesic: Secondary | ICD-10-CM | POA: Insufficient documentation

## 2020-11-16 MED ORDER — HYDROCODONE-ACETAMINOPHEN 7.5-325 MG PO TABS: 1.0000 | ORAL_TABLET | Freq: Four times a day (QID) | ORAL | 0 refills | Status: DC | PRN

## 2020-11-16 NOTE — Progress Notes (Signed)
Subjective:    Patient ID: Shannon French, female    DOB: 1966/06/14, 55 y.o.   MRN: [id number]  HPI: Shannon French is a 55 y.o. female who returns for follow up appointment for chronic pain and medication refill. She states her pain is located in her mid- back  And generalized joint pain. She rates her pain 4. Her current exercise regime is walking and performing stretching exercises.  Shannon French Morphine equivalent is 30.00 MME.  Last Oral Swab was Performed on 09/19/2020, it was consistent.   Pain Inventory Average Pain 5 Pain Right Now 4 My pain is constant, sharp, burning, dull, stabbing, tingling and aching  In the last 24 hours, has pain interfered with the following? General activity 5 Relation with others 5 Enjoyment of life 7 What TIME of day is your pain at its worst? varies Sleep (in general) NA  Pain is worse with: walking, bending, sitting, inactivity, standing and some activites Pain improves with: rest, heat/ice, therapy/exercise, pacing activities and medication Relief from Meds: 5  Family History  Problem Relation Age of Onset  . Cancer Father    Social History   Socioeconomic History  . Marital status: Single    Spouse name: Not on file  . Number of children: Not on file  . Years of education: Not on file  . Highest education level: Not on file  Occupational History  . Not on file  Tobacco Use  . Smoking status: Former Smoker    Quit date: 10/14/2009    Years since quitting: 11.0  . Smokeless tobacco: Never Used  Vaping Use  . Vaping Use: Never used  Substance and Sexual Activity  . Alcohol use: No  . Drug use: No  . Sexual activity: Not on file  Other Topics Concern  . Not on file  Social History Narrative  . Not on file   Social Determinants of Health   Financial Resource Strain: Not on file  Food Insecurity: Not on file  Transportation Needs: Not on file  Physical Activity: Not on file  Stress: Not on file  Social Connections: Not on  file   Past Surgical History:  Procedure Laterality Date  . ABDOMINAL HYSTERECTOMY    . ABDOMINAL SURGERY    . APPENDECTOMY    . CHOLECYSTECTOMY    . CYST EXCISION    . OVARY SURGERY     Past Surgical History:  Procedure Laterality Date  . ABDOMINAL HYSTERECTOMY    . ABDOMINAL SURGERY    . APPENDECTOMY    . CHOLECYSTECTOMY    . CYST EXCISION    . OVARY SURGERY     Past Medical History:  Diagnosis Date  . Arm pain    left  . Degenerative disc disease   . Diabetes mellitus   . Fibromyalgia   . Hyperlipidemia   . Hypertension   . IBS (irritable bowel syndrome)   . Osteoporosis    BP 118/81   Pulse 77   Temp 98.3 F (36.8 C)   Ht 5\' 4"  (1.626 m)   Wt 138 lb 6.4 oz (62.8 kg)   SpO2 95%   BMI 23.76 kg/m   Opioid Risk Score:   Fall Risk Score:  `1  Depression screen PHQ 2/9  Depression screen [facility] 2/9 11/16/2020 09/19/2020 02/02/2020 06/01/2019 11/04/2018 09/08/2018 07/08/2018  Decreased Interest 0 0 0 0 0 0 0  Down, Depressed, Hopeless 0 0 0 - 0 0 0  PHQ - 2 Score 0 0 0  0 0 0 0  Altered sleeping - - - - - - -  Tired, decreased energy - - - - - - -  Change in appetite - - - - - - -  Feeling bad or failure about yourself  - - - - - - -  Trouble concentrating - - - - - - -  Moving slowly or fidgety/restless - - - - - - -  Suicidal thoughts - - - - - - -  PHQ-9 Score - - - - - - -      Review of Systems  Constitutional: Negative.   HENT: Negative.   Eyes: Negative.   Respiratory: Negative.   Cardiovascular: Negative.   Gastrointestinal: Negative.   Endocrine: Negative.   Genitourinary: Negative.   Musculoskeletal: Positive for back pain, gait problem, joint swelling and neck pain.       RIGHT AND LEFT ARM PAIN  Skin: Negative.   Allergic/Immunologic: Negative.   Hematological: Negative.   Psychiatric/Behavioral: Negative.        Objective:   Physical Exam Vitals and nursing note reviewed.  Constitutional:      Appearance: Normal appearance.  Neck:      Comments: Cervical Paraspinal Tenderness: C-5-C-6 Cardiovascular:     Rate and Rhythm: Normal rate and regular rhythm.     Pulses: Normal pulses.     Heart sounds: Normal heart sounds.  Pulmonary:     Effort: Pulmonary effort is normal.     Breath sounds: Normal breath sounds.  Musculoskeletal:     Cervical back: Normal range of motion and neck supple.     Comments: Normal Muscle Bulk and Muscle Testing Reveals:  Upper Extremities: Full ROM and Muscle Strength 5/5 Right AC Joint Tenderness  Thoracic Paraspinal Tenderness: T-3-T-7  Lower Extremities: Full ROM and Muscle Strength 5/5 Arises from Table with Ease Narrow Based  Gait   Skin:    General: Skin is warm and dry.  Neurological:     Mental Status: She is alert and oriented to person, place, and time.  Psychiatric:        Mood and Affect: Mood normal.        Behavior: Behavior normal.           Assessment & Plan:  1. Bilateral sacroiliac joint dysfunction:11/16/2020. Refilled:HYDROcodone 7.5/325mg  one tablet every 6 hrs as needed # 120.Second script sent for the following month. We will continue the opioid monitoring program, this consists of regular clinic visits, examinations, urine drug screen, pill counts as well as use of [place] Controlled Substance Reporting system. A 12 month History has been reviewed on the [place] Controlled Substance Reporting Systemon04/14/2022. 2. Chronic pain syndrome consistent with fibromyalgia. Continue with exercise regime, heat therapy,voltaren gel, lidocaine patches and topamax. Continue current analgesics .11/16/2020. 3. Lumbosacral Spondylosis/ Bilaterallumbar radiculopathy:Continue Elavil and Cymbalta.11/16/2020, 4. Complex Regional Pain Syndrome: Continue Cymbalta..Dr. Yolanda Bonine Following.11/16/2020 5. Type 2 diabetes.: PCPFollowing.11/16/2020 6. Muscle Spasms: Continue Tizanidine 4 mg one tablet every 8 hours as needed for muscle spasms.11/16/2020 7.  Insomnia: Continue Elavil.Continue to monitor.11/17/2019 8.Cervical Radiculopathy:No complaints today.Continue Elavil:Continue to monitor.11/16/2020 9. Cervicalgia/ Cervical Inflammation:Continue current medication regime. Alternate with heat and Ice Therapy.11/16/2020 10. Bilateral Thoracic Pain:Continue HEP as Tolerated.Continue current medication regime. Continue to Monitor.11/16/2020 11. Left Shoulder Pain:No complaints today.Continue with HEP as Tolerated. Continue to Alternate Ice and Heat. Continue to monitor.Ortho Following.11/16/2020. 12. Bilateral Hand Pain: No complaints today. Continue current medication regimen. Continue HEP as tolerated. Continue to monitor. 11/16/2020 13. Polyarthralgia:  Continue HEP as Tolerated. Continue to alternate Ice and Heat Therapy. Continue to monitor. 11/16/2020  F/U in 2 months

## 2020-11-19 ENCOUNTER — Other Ambulatory Visit: Payer: Self-pay | Admitting: Physical Medicine & Rehabilitation

## 2020-11-19 DIAGNOSIS — M47817 Spondylosis without myelopathy or radiculopathy, lumbosacral region: Secondary | ICD-10-CM

## 2020-11-19 DIAGNOSIS — M797 Fibromyalgia: Secondary | ICD-10-CM

## 2020-12-05 ENCOUNTER — Other Ambulatory Visit: Payer: Self-pay | Admitting: Internal Medicine

## 2020-12-05 DIAGNOSIS — R7989 Other specified abnormal findings of blood chemistry: Secondary | ICD-10-CM

## 2020-12-12 ENCOUNTER — Encounter: Payer: Self-pay | Admitting: Gastroenterology

## 2020-12-14 ENCOUNTER — Other Ambulatory Visit: Payer: Self-pay | Admitting: Physical Medicine & Rehabilitation

## 2020-12-14 ENCOUNTER — Other Ambulatory Visit: Payer: Self-pay | Admitting: Registered Nurse

## 2020-12-14 DIAGNOSIS — G894 Chronic pain syndrome: Secondary | ICD-10-CM

## 2020-12-14 DIAGNOSIS — G90512 Complex regional pain syndrome I of left upper limb: Secondary | ICD-10-CM

## 2020-12-18 ENCOUNTER — Ambulatory Visit
Admission: RE | Admit: 2020-12-18 | Discharge: 2020-12-18 | Disposition: A | Discharge status: Home / Self Care | Payer: Medicare Other | Source: Ambulatory Visit | Attending: Internal Medicine | Admitting: Internal Medicine

## 2020-12-18 DIAGNOSIS — R7989 Other specified abnormal findings of blood chemistry: Secondary | ICD-10-CM

## 2020-12-27 ENCOUNTER — Other Ambulatory Visit: Payer: Self-pay | Admitting: Registered Nurse

## 2021-01-04 ENCOUNTER — Telehealth: Payer: Self-pay

## 2021-01-04 MED ORDER — DULOXETINE HCL 60 MG PO CPEP: 60.0000 mg | ORAL_CAPSULE | Freq: Every day | ORAL | 2 refills | Status: DC

## 2021-01-04 NOTE — Telephone Encounter (Signed)
rx phoned in

## 2021-01-18 ENCOUNTER — Other Ambulatory Visit: Payer: Self-pay

## 2021-01-18 ENCOUNTER — Encounter: Payer: Self-pay | Admitting: Registered Nurse

## 2021-01-18 ENCOUNTER — Encounter: Payer: Medicare Other | Attending: Physical Medicine & Rehabilitation | Admitting: Registered Nurse

## 2021-01-18 VITALS — BP 119/72 | HR 72 | Temp 98.0°F | Ht 63.0 in | Wt 129.4 lb

## 2021-01-18 DIAGNOSIS — M2550 Pain in unspecified joint: Secondary | ICD-10-CM | POA: Insufficient documentation

## 2021-01-18 DIAGNOSIS — G8929 Other chronic pain: Secondary | ICD-10-CM | POA: Insufficient documentation

## 2021-01-18 DIAGNOSIS — G894 Chronic pain syndrome: Secondary | ICD-10-CM | POA: Diagnosis present

## 2021-01-18 DIAGNOSIS — Z5181 Encounter for therapeutic drug level monitoring: Secondary | ICD-10-CM | POA: Insufficient documentation

## 2021-01-18 DIAGNOSIS — M47817 Spondylosis without myelopathy or radiculopathy, lumbosacral region: Secondary | ICD-10-CM | POA: Insufficient documentation

## 2021-01-18 DIAGNOSIS — M25511 Pain in right shoulder: Secondary | ICD-10-CM | POA: Insufficient documentation

## 2021-01-18 DIAGNOSIS — M546 Pain in thoracic spine: Secondary | ICD-10-CM | POA: Diagnosis present

## 2021-01-18 DIAGNOSIS — M25512 Pain in left shoulder: Secondary | ICD-10-CM | POA: Insufficient documentation

## 2021-01-18 DIAGNOSIS — Z79891 Long term (current) use of opiate analgesic: Secondary | ICD-10-CM | POA: Diagnosis present

## 2021-01-18 DIAGNOSIS — M542 Cervicalgia: Secondary | ICD-10-CM | POA: Insufficient documentation

## 2021-01-18 MED ORDER — HYDROCODONE-ACETAMINOPHEN 7.5-325 MG PO TABS: 1.0000 | ORAL_TABLET | Freq: Four times a day (QID) | ORAL | 0 refills | Status: DC | PRN

## 2021-01-18 NOTE — Progress Notes (Signed)
Subjective:    Patient ID: Shannon French, female    DOB: 1966/06/27, 55 y.o.   MRN: [id number]  HPI: Shannon French is a 55 y.o. female who returns for follow up appointment for chronic pain and medication refill. She states her pain is located in her neck , bilateral shoulders and mid- lower back pain. She rates her pain 6. Her current exercise regime is walking and performing stretching exercises.  Ms. Capek Morphine equivalent is 30.00  MME.   Oral Swab was Performed today.     Pain Inventory Average Pain 7 Pain Right Now 6 My pain is constant, sharp, burning, dull, stabbing, tingling, and aching  In the last 24 hours, has pain interfered with the following? General activity 5 Relation with others 5 Enjoyment of life 5 What TIME of day is your pain at its worst? morning , daytime, evening, and night Sleep (in general) Fair  Pain is worse with: walking, bending, sitting, inactivity, standing, and some activites Pain improves with: rest, heat/ice, therapy/exercise, pacing activities, and medication Relief from Meds: 5  Family History  Problem Relation Age of Onset   Cancer Father    Social History   Socioeconomic History   Marital status: Single    Spouse name: Not on file   Number of children: Not on file   Years of education: Not on file   Highest education level: Not on file  Occupational History   Not on file  Tobacco Use   Smoking status: Former    Pack years: 0.00    Types: Cigarettes    Quit date: 10/14/2009    Years since quitting: 11.2   Smokeless tobacco: Never  Vaping Use   Vaping Use: Never used  Substance and Sexual Activity   Alcohol use: No   Drug use: No   Sexual activity: Not on file  Other Topics Concern   Not on file  Social History Narrative   Not on file   Social Determinants of Health   Financial Resource Strain: Not on file  Food Insecurity: Not on file  Transportation Needs: Not on file  Physical Activity: Not on file  Stress: Not  on file  Social Connections: Not on file   Past Surgical History:  Procedure Laterality Date   ABDOMINAL HYSTERECTOMY     ABDOMINAL SURGERY     APPENDECTOMY     CHOLECYSTECTOMY     CYST EXCISION     OVARY SURGERY     Past Surgical History:  Procedure Laterality Date   ABDOMINAL HYSTERECTOMY     ABDOMINAL SURGERY     APPENDECTOMY     CHOLECYSTECTOMY     CYST EXCISION     OVARY SURGERY     Past Medical History:  Diagnosis Date   Arm pain    left   Degenerative disc disease    Diabetes mellitus    Fibromyalgia    Hyperlipidemia    Hypertension    IBS (irritable bowel syndrome)    Osteoporosis    BP 119/72 (BP Location: Left Arm, Patient Position: Sitting, Cuff Size: Normal)   Pulse 72   Temp 98 F (36.7 C) (Oral)   Ht 5\' 3"  (1.6 m)   Wt 129 lb 6.4 oz (58.7 kg)   SpO2 98%   BMI 22.92 kg/m   Opioid Risk Score:   Fall Risk Score:  `1  Depression screen [facility] 2/9  Depression screen [facility] 2/9 11/16/2020 09/19/2020 02/02/2020 06/01/2019 11/04/2018 09/08/2018 07/08/2018  Decreased Interest  0 0 0 0 0 0 0  Down, Depressed, Hopeless 0 0 0 - 0 0 0  PHQ - 2 Score 0 0 0 0 0 0 0  Altered sleeping - - - - - - -  Tired, decreased energy - - - - - - -  Change in appetite - - - - - - -  Feeling bad or failure about yourself  - - - - - - -  Trouble concentrating - - - - - - -  Moving slowly or fidgety/restless - - - - - - -  Suicidal thoughts - - - - - - -  PHQ-9 Score - - - - - - -     Review of Systems  Musculoskeletal:  Positive for arthralgias, back pain, myalgias and neck pain.       Leg pain Feet pain  All other systems reviewed and are negative.     Objective:   Physical Exam Vitals and nursing note reviewed.  Constitutional:      Appearance: Normal appearance.  Neck:     Comments: Cervical Paraspinal Tenderness: C-5-C-6 Cardiovascular:     Rate and Rhythm: Normal rate and regular rhythm.     Pulses: Normal pulses.     Heart sounds: Normal heart sounds.   Pulmonary:     Effort: Pulmonary effort is normal.     Breath sounds: Normal breath sounds.  Musculoskeletal:     Cervical back: Normal range of motion and neck supple.     Comments: Normal Muscle Bulk and Muscle Testing Reveals:  Upper Extremities: Full ROM and Muscle Strength  5/5 Bilateral AC Joint Tenderness Thoracic Paraspinal Tenderness: T-7-T-9 Lumbar Paraspinal Tenderness: L-3-L-5 Lower Extremities: Full ROM and Muscle Strength 5/5 Arises from chair with ease Narrow Based  Gait     Skin:    General: Skin is warm and dry.  Neurological:     Mental Status: She is alert and oriented to person, place, and time.  Psychiatric:        Mood and Affect: Mood normal.        Behavior: Behavior normal.         Assessment & Plan:  1. Bilateral sacroiliac joint dysfunction: 01/18/2021. Refilled: HYDROcodone 7.5/325mg  one tablet every 6 hrs as needed # 120. Second script sent for the following month. We will continue the opioid monitoring program, this consists of regular clinic visits, examinations, urine drug screen, pill counts as well as use of [place] Controlled Substance Reporting system. A 12 month History has been reviewed on the [place] Controlled Substance Reporting System on 01/18/2021.  2. Chronic pain syndrome consistent with fibromyalgia. Continue with exercise regime, heat therapy,voltaren gel, lidocaine patches and topamax. Continue current analgesics . 01/18/2021. 3. Lumbosacral Spondylosis/ Bilateral lumbar radiculopathy:Continue Elavil and Cymbalta. 01/18/2021,  4. Complex Regional Pain Syndrome: Continue Cymbalta. .Dr. Yolanda Bonine Following. 01/18/2021 5. Type 2 diabetes.: PCP Following. 01/18/2021 6. Muscle Spasms: Continue Tizanidine 4 mg one tablet every 8 hours as needed for muscle spasms. 01/18/2021 7. Insomnia: Continue Elavil. Continue to monitor. 01/19/2020 8.Cervical Radiculopathy:No complaints today.  Continue Elavil: Continue to monitor.  01/18/2021 9. Cervicalgia/ Cervical Inflammation: Continue current medication regime. Alternate with heat and Ice Therapy. 01/18/2021 10. Bilateral Thoracic Pain:Continue HEP as Tolerated.  Continue current medication regime. Continue to Monitor. 01/18/2021 11. Bilateral  Shoulder Pain: Continue with HEP as Tolerated. Continue to Alternate Ice and Heat. Continue to monitor. Ortho Following. 0616/2022.   12. Bilateral Hand Pain: No complaints today.  Continue current medication regimen. Continue HEP as tolerated. Continue to monitor. 0616/2022 13. Polyarthralgia: Continue HEP as Tolerated. Continue to alternate Ice and Heat Therapy. Continue to monitor. 0616/2022   F/U in 2 months

## 2021-01-25 LAB — DRUG TOX MONITOR 1 W/CONF, ORAL FLD
Amphetamines: NEGATIVE ng/mL (ref <10)
Barbiturates: NEGATIVE ng/mL (ref <10)
Benzodiazepines: NEGATIVE ng/mL (ref <0.50)
Buprenorphine: NEGATIVE ng/mL (ref <0.10)
Cocaine: NEGATIVE ng/mL (ref <5.0)
Codeine: NEGATIVE ng/mL (ref <2.5)
Cotinine: 143.6 ng/mL — ABNORMAL HIGH (ref <5.0)
Dihydrocodeine: 4.3 ng/mL — ABNORMAL HIGH (ref <2.5)
Fentanyl: NEGATIVE ng/mL (ref <0.10)
Heroin Metabolite: NEGATIVE ng/mL (ref <1.0)
Hydrocodone: 46.5 ng/mL — ABNORMAL HIGH (ref <2.5)
Hydromorphone: NEGATIVE ng/mL (ref <2.5)
MARIJUANA: NEGATIVE ng/mL (ref <2.5)
MDMA: NEGATIVE ng/mL (ref <10)
Meprobamate: NEGATIVE ng/mL (ref <2.5)
Methadone: NEGATIVE ng/mL (ref <5.0)
Morphine: NEGATIVE ng/mL (ref <2.5)
Nicotine Metabolite: POSITIVE ng/mL — AB (ref <5.0)
Norhydrocodone: 7.3 ng/mL — ABNORMAL HIGH (ref <2.5)
Noroxycodone: NEGATIVE ng/mL (ref <2.5)
Opiates: POSITIVE ng/mL — AB (ref <2.5)
Oxycodone: NEGATIVE ng/mL (ref <2.5)
Oxymorphone: NEGATIVE ng/mL (ref <2.5)
Phencyclidine: NEGATIVE ng/mL (ref <10)
Tapentadol: NEGATIVE ng/mL (ref <5.0)
Tramadol: NEGATIVE ng/mL (ref <5.0)
Zolpidem: NEGATIVE ng/mL (ref <5.0)

## 2021-01-25 LAB — DRUG TOX ALC METAB W/CON, ORAL FLD: Alcohol Metabolite: NEGATIVE ng/mL (ref <25)

## 2021-01-29 ENCOUNTER — Telehealth: Payer: Self-pay | Admitting: *Deleted

## 2021-01-29 NOTE — Telephone Encounter (Signed)
Oral swab drug screen was consistent for prescribed medications.  ?

## 2021-03-09 ENCOUNTER — Other Ambulatory Visit: Payer: Self-pay

## 2021-03-09 MED ORDER — HYDROCODONE-ACETAMINOPHEN 7.5-325 MG PO TABS: 1.0000 | ORAL_TABLET | Freq: Four times a day (QID) | ORAL | 0 refills | Status: DC | PRN

## 2021-03-09 NOTE — Telephone Encounter (Signed)
Patient call for refill of Rx Hydrocodone 7.5-325 MG.  PMP last filled date 02/24/2021

## 2021-03-09 NOTE — Telephone Encounter (Signed)
PMP was Reviewed:  Hydrocodone e-scribed with post date, due to her next scheduled appointment. Placed a call to Shannon French regarding the above, she verbalizes understanding.

## 2021-04-02 ENCOUNTER — Other Ambulatory Visit: Payer: Self-pay

## 2021-04-02 ENCOUNTER — Encounter: Payer: Medicare Other | Attending: Physical Medicine & Rehabilitation | Admitting: Registered Nurse

## 2021-04-02 ENCOUNTER — Encounter: Payer: Self-pay | Admitting: Registered Nurse

## 2021-04-02 VITALS — BP 111/81 | HR 80 | Temp 98.8°F | Ht 63.0 in | Wt 121.0 lb

## 2021-04-02 DIAGNOSIS — G609 Hereditary and idiopathic neuropathy, unspecified: Secondary | ICD-10-CM | POA: Insufficient documentation

## 2021-04-02 DIAGNOSIS — G894 Chronic pain syndrome: Secondary | ICD-10-CM | POA: Insufficient documentation

## 2021-04-02 DIAGNOSIS — M5412 Radiculopathy, cervical region: Secondary | ICD-10-CM | POA: Diagnosis present

## 2021-04-02 DIAGNOSIS — M542 Cervicalgia: Secondary | ICD-10-CM

## 2021-04-02 DIAGNOSIS — M25511 Pain in right shoulder: Secondary | ICD-10-CM | POA: Diagnosis present

## 2021-04-02 DIAGNOSIS — M546 Pain in thoracic spine: Secondary | ICD-10-CM | POA: Insufficient documentation

## 2021-04-02 DIAGNOSIS — Z79891 Long term (current) use of opiate analgesic: Secondary | ICD-10-CM | POA: Insufficient documentation

## 2021-04-02 DIAGNOSIS — M25512 Pain in left shoulder: Secondary | ICD-10-CM | POA: Diagnosis present

## 2021-04-02 DIAGNOSIS — G8929 Other chronic pain: Secondary | ICD-10-CM | POA: Diagnosis present

## 2021-04-02 DIAGNOSIS — M7062 Trochanteric bursitis, left hip: Secondary | ICD-10-CM | POA: Diagnosis present

## 2021-04-02 DIAGNOSIS — M7061 Trochanteric bursitis, right hip: Secondary | ICD-10-CM | POA: Insufficient documentation

## 2021-04-02 DIAGNOSIS — M47817 Spondylosis without myelopathy or radiculopathy, lumbosacral region: Secondary | ICD-10-CM | POA: Insufficient documentation

## 2021-04-02 DIAGNOSIS — Z5181 Encounter for therapeutic drug level monitoring: Secondary | ICD-10-CM | POA: Diagnosis present

## 2021-04-02 DIAGNOSIS — M2550 Pain in unspecified joint: Secondary | ICD-10-CM | POA: Diagnosis present

## 2021-04-02 MED ORDER — HYDROCODONE-ACETAMINOPHEN 7.5-325 MG PO TABS: 1.0000 | ORAL_TABLET | Freq: Four times a day (QID) | ORAL | 0 refills | Status: DC | PRN

## 2021-04-02 NOTE — Progress Notes (Signed)
Subjective:    Patient ID: Shannon French, female    DOB: 1966/01/06, 55 y.o.   MRN: [id number]  HPI: Shannon French is a 55 y.o. female who returns for follow up appointment for chronic pain and medication refill. She states her pain is located in her neck radiating into her right shoulder, and right arm with tingling and numbness and mid- lower back pain. Also reports bilateral hands and bilateral feet with tingling and numbness,bilateral hip pain and generalized joint pain. She rates her pain 6. Her current exercise regime is walking, pool therapy and performing stretching exercises.  [name] equivalent is 30.00   MME.   Last Oral Swab was Performed on 01/18/2021, it was consistent.      Pain Inventory Average Pain 7 Pain Right Now 6 My pain is constant, sharp, burning, dull, stabbing, tingling, and aching  In the last 24 hours, has pain interfered with the following? General activity 8 Relation with others 8 Enjoyment of life 7 What TIME of day is your pain at its worst? morning , daytime, evening, and night Sleep (in general) Fair  Pain is worse with: walking, bending, sitting, inactivity, standing, and some activites Pain improves with: rest, heat/ice, therapy/exercise, pacing activities, and medication Relief from Meds: 5  Family History  Problem Relation Age of Onset   Cancer Father    Social History   Socioeconomic History   Marital status: Single    Spouse name: Not on file   Number of children: Not on file   Years of education: Not on file   Highest education level: Not on file  Occupational History   Not on file  Tobacco Use   Smoking status: Former    Types: Cigarettes    Quit date: 10/14/2009    Years since quitting: 11.4   Smokeless tobacco: Never  Vaping Use   Vaping Use: Never used  Substance and Sexual Activity   Alcohol use: No   Drug use: No   Sexual activity: Not on file  Other Topics Concern   Not on file  Social History Narrative    Not on file   Social Determinants of Health   Financial Resource Strain: Not on file  Food Insecurity: Not on file  Transportation Needs: Not on file  Physical Activity: Not on file  Stress: Not on file  Social Connections: Not on file   Past Surgical History:  Procedure Laterality Date   ABDOMINAL HYSTERECTOMY     ABDOMINAL SURGERY     APPENDECTOMY     CHOLECYSTECTOMY     CYST EXCISION     OVARY SURGERY     Past Surgical History:  Procedure Laterality Date   ABDOMINAL HYSTERECTOMY     ABDOMINAL SURGERY     APPENDECTOMY     CHOLECYSTECTOMY     CYST EXCISION     OVARY SURGERY     Past Medical History:  Diagnosis Date   Arm pain    left   Degenerative disc disease    Diabetes mellitus    Fibromyalgia    Hyperlipidemia    Hypertension    IBS (irritable bowel syndrome)    Osteoporosis    BP 111/81   Pulse 80   Temp 98.8 F (37.1 C)   Ht 5\' 3"  (1.6 m)   Wt 121 lb (54.9 kg)   SpO2 97%   BMI 21.43 kg/m   Opioid Risk Score:   Fall Risk Score:  `1  Depression screen PHQ  2/9  Depression screen PHQ 2/9 11/16/2020 09/19/2020 02/02/2020 06/01/2019 11/04/2018 09/08/2018 07/08/2018  Decreased Interest 0 0 0 0 0 0 0  Down, Depressed, Hopeless 0 0 0 - 0 0 0  PHQ - 2 Score 0 0 0 0 0 0 0  Altered sleeping - - - - - - -  Tired, decreased energy - - - - - - -  Change in appetite - - - - - - -  Feeling bad or failure about yourself  - - - - - - -  Trouble concentrating - - - - - - -  Moving slowly or fidgety/restless - - - - - - -  Suicidal thoughts - - - - - - -  PHQ-9 Score - - - - - - -    Review of Systems  Constitutional: Negative.   HENT: Negative.    Eyes: Negative.   Respiratory: Negative.    Cardiovascular: Negative.   Gastrointestinal: Negative.   Endocrine: Negative.   Genitourinary: Negative.   Musculoskeletal:  Positive for arthralgias, back pain, gait problem, myalgias, neck pain and neck stiffness.  Skin: Negative.   Allergic/Immunologic: Negative.    Hematological: Negative.   All other systems reviewed and are negative.     Objective:   Physical Exam Vitals and nursing note reviewed.  Constitutional:      Appearance: Normal appearance.  Neck:     Comments: Cervical Paraspinal Tenderness: C-5-C-6 Cardiovascular:     Rate and Rhythm: Normal rate and regular rhythm.     Pulses: Normal pulses.     Heart sounds: Normal heart sounds.  Pulmonary:     Effort: Pulmonary effort is normal.     Breath sounds: Normal breath sounds.  Musculoskeletal:     Cervical back: Normal range of motion and neck supple.     Comments: Normal Muscle Bulk and Muscle Testing Reveals:  Upper Extremities: Full ROM and Muscle Strength  5/5 Bilateral AC Joint Tenderness Thoracic Paraspinal Tenderness: T-7-T-9 Lumbar Paraspinal Tenderness: L-3-L-5 Lower Extremities: Full ROM and and Muscle Strength 5/5 Arises from chair with ease Narrow Based  Gait     Skin:    General: Skin is warm and dry.  Neurological:     Mental Status: She is alert and oriented to person, place, and time.  Psychiatric:        Mood and Affect: Mood normal.        Behavior: Behavior normal.         Assessment & Plan:  1. Bilateral sacroiliac joint dysfunction: 04/02/2021. Refilled: HYDROcodone 7.5/325mg  one tablet every 6 hrs as needed # 120. Second script sent for the following month. We will continue the opioid monitoring program, this consists of regular clinic visits, examinations, urine drug screen, pill counts as well as use of [place] Controlled Substance Reporting system. A 12 month History has been reviewed on the [place] Controlled Substance Reporting System on 04/02/2021.  2. Chronic pain syndrome consistent with fibromyalgia. Continue with exercise regime, heat therapy,voltaren gel, lidocaine patches and topamax. Continue current analgesics . 04/02/2021. 3. Lumbosacral Spondylosis/ Bilateral lumbar radiculopathy:Continue Elavil and Cymbalta.  04/02/2021,  4. Complex Regional Pain Syndrome: Continue Cymbalta. .Dr. Yolanda Bonine Following. 04/02/2021 5. Type 2 diabetes.: PCP Following. 04/02/2021 6. Muscle Spasms: Continue Tizanidine 4 mg one tablet every 8 hours as needed for muscle spasms. 04/02/2021 7. Insomnia: Continue Elavil. Continue to monitor. 04/02/2020 8.Cervical Radiculopathy:No complaints today.  Continue Elavil: Continue to monitor. 04/02/2021 9. Cervicalgia/ Cervical Inflammation: Continue current  medication regime. Alternate with heat and Ice Therapy. 04/02/2021 10. Bilateral Thoracic Pain:Continue HEP as Tolerated.  Continue current medication regime. Continue to Monitor. 04/02/2021 11. Bilateral  Shoulder Pain: Continue with HEP as Tolerated. Continue to Alternate Ice and Heat. Continue to monitor. Ortho Following. 08/ 29/2022.   12. Bilateral Hand and Bilateral Feet Neuropathic Pain:  Continue current medication regimen. Continue HEP as tolerated. Continue to monitor. 04/02/2021 13. Polyarthralgia: Continue HEP as Tolerated. Continue to alternate Ice and Heat Therapy. Continue to monitor. 04/02/2021   F/U in 2 months

## 2021-05-23 ENCOUNTER — Encounter: Payer: Medicare Other | Attending: Physical Medicine & Rehabilitation | Admitting: Registered Nurse

## 2021-05-23 ENCOUNTER — Encounter: Payer: Self-pay | Admitting: Registered Nurse

## 2021-05-23 ENCOUNTER — Other Ambulatory Visit: Payer: Self-pay

## 2021-05-23 VITALS — BP 115/81 | HR 80 | Temp 98.4°F | Ht 63.0 in | Wt 119.8 lb

## 2021-05-23 DIAGNOSIS — M546 Pain in thoracic spine: Secondary | ICD-10-CM | POA: Diagnosis present

## 2021-05-23 DIAGNOSIS — G894 Chronic pain syndrome: Secondary | ICD-10-CM | POA: Diagnosis present

## 2021-05-23 DIAGNOSIS — M7061 Trochanteric bursitis, right hip: Secondary | ICD-10-CM

## 2021-05-23 DIAGNOSIS — M25511 Pain in right shoulder: Secondary | ICD-10-CM | POA: Diagnosis present

## 2021-05-23 DIAGNOSIS — M542 Cervicalgia: Secondary | ICD-10-CM

## 2021-05-23 DIAGNOSIS — M2550 Pain in unspecified joint: Secondary | ICD-10-CM | POA: Diagnosis present

## 2021-05-23 DIAGNOSIS — G8929 Other chronic pain: Secondary | ICD-10-CM | POA: Diagnosis present

## 2021-05-23 DIAGNOSIS — M7062 Trochanteric bursitis, left hip: Secondary | ICD-10-CM | POA: Diagnosis present

## 2021-05-23 DIAGNOSIS — Z5181 Encounter for therapeutic drug level monitoring: Secondary | ICD-10-CM | POA: Diagnosis present

## 2021-05-23 DIAGNOSIS — G609 Hereditary and idiopathic neuropathy, unspecified: Secondary | ICD-10-CM

## 2021-05-23 DIAGNOSIS — M47817 Spondylosis without myelopathy or radiculopathy, lumbosacral region: Secondary | ICD-10-CM

## 2021-05-23 DIAGNOSIS — M25512 Pain in left shoulder: Secondary | ICD-10-CM | POA: Diagnosis present

## 2021-05-23 MED ORDER — HYDROCODONE-ACETAMINOPHEN 7.5-325 MG PO TABS: 1.0000 | ORAL_TABLET | Freq: Four times a day (QID) | ORAL | 0 refills | Status: DC | PRN

## 2021-05-23 NOTE — Progress Notes (Signed)
Subjective:    Patient ID: Shannon French, female    DOB: [birth year], 55 y.o.   MRN: [id number]  HPI: Shannon French is a 55 y.o. female who returns for follow up appointment for chronic pain and medication refill. She states her pain is located in her neck, bilateral shoulders, mid- lower back pain , bilateral hip and generalized joint pain. She rates her pain 6. Her current exercise regime is walking and performing stretching exercises.  Ms. Stammer Morphine equivalent is 30.00 MME.   UDS ordered today.     Pain Inventory Average Pain 6 Pain Right Now 6 My pain is constant, sharp, burning, dull, stabbing, tingling, and aching  In the last 24 hours, has pain interfered with the following? General activity 5 Relation with others 5 Enjoyment of life 5 What TIME of day is your pain at its worst? morning , daytime, evening, and night Sleep (in general) Fair  Pain is worse with: walking, bending, sitting, inactivity, standing, and some activites Pain improves with: rest, heat/ice, therapy/exercise, pacing activities, and medication Relief from Meds: 5  Family History  Problem Relation Age of Onset   Cancer Father    Social History   Socioeconomic History   Marital status: Single    Spouse name: Not on file   Number of children: Not on file   Years of education: Not on file   Highest education level: Not on file  Occupational History   Not on file  Tobacco Use   Smoking status: Former    Types: Cigarettes    Quit date: 10/14/2009    Years since quitting: 11.6   Smokeless tobacco: Never  Vaping Use   Vaping Use: Never used  Substance and Sexual Activity   Alcohol use: No   Drug use: No   Sexual activity: Not on file  Other Topics Concern   Not on file  Social History Narrative   Not on file   Social Determinants of Health   Financial Resource Strain: Not on file  Food Insecurity: Not on file  Transportation Needs: Not on file  Physical Activity: Not on file  Stress:  Not on file  Social Connections: Not on file   Past Surgical History:  Procedure Laterality Date   ABDOMINAL HYSTERECTOMY     ABDOMINAL SURGERY     APPENDECTOMY     CHOLECYSTECTOMY     CYST EXCISION     OVARY SURGERY     Past Surgical History:  Procedure Laterality Date   ABDOMINAL HYSTERECTOMY     ABDOMINAL SURGERY     APPENDECTOMY     CHOLECYSTECTOMY     CYST EXCISION     OVARY SURGERY     Past Medical History:  Diagnosis Date   Arm pain    left   Degenerative disc disease    Diabetes mellitus    Fibromyalgia    Hyperlipidemia    Hypertension    IBS (irritable bowel syndrome)    Osteoporosis    Temp 98.4 F (36.9 C)   Ht 5\' 3"  (1.6 m)   Wt 119 lb 12.8 oz (54.3 kg)   BMI 21.22 kg/m   Opioid Risk Score:   Fall Risk Score:  `1  Depression screen PHQ 2/9  Depression screen [facility] 2/9 05/23/2021 11/16/2020 09/19/2020 02/02/2020 06/01/2019 11/04/2018 09/08/2018  Decreased Interest 0 0 0 0 0 0 0  Down, Depressed, Hopeless 0 0 0 0 - 0 0  PHQ - 2 Score 0 0 0 0  0 0 0  Altered sleeping - - - - - - -  Tired, decreased energy - - - - - - -  Change in appetite - - - - - - -  Feeling bad or failure about yourself  - - - - - - -  Trouble concentrating - - - - - - -  Moving slowly or fidgety/restless - - - - - - -  Suicidal thoughts - - - - - - -  PHQ-9 Score - - - - - - -     Review of Systems  Constitutional: Negative.   HENT: Negative.    Eyes: Negative.   Respiratory: Negative.    Cardiovascular: Negative.   Gastrointestinal: Negative.   Endocrine: Negative.   Genitourinary: Negative.   Musculoskeletal:  Positive for arthralgias and back pain.  Skin: Negative.   Allergic/Immunologic: Negative.   Neurological: Negative.   Hematological: Negative.   Psychiatric/Behavioral: Negative.        Objective:   Physical Exam Vitals and nursing note reviewed.  Constitutional:      Appearance: Normal appearance.  Cardiovascular:     Rate and Rhythm: Normal rate and  regular rhythm.     Pulses: Normal pulses.     Heart sounds: Normal heart sounds.  Pulmonary:     Effort: Pulmonary effort is normal.     Breath sounds: Normal breath sounds.  Musculoskeletal:     Cervical back: Normal range of motion and neck supple.     Comments: Normal Muscle Bulk and Muscle Testing Reveals:  Upper Extremities: Full ROM and Muscle Strength 5/5 Bilateral AC Joint Tenderness  Thoracic Paraspinal Tenderness: T-7-T-9 Lumbar Hypersensitivity Lower Extremities: Full ROM and Muscle Strength 5/5 Arises from Table with ease Narrow Based  Gait     Skin:    General: Skin is warm and dry.  Neurological:     Mental Status: She is alert and oriented to person, place, and time.  Psychiatric:        Mood and Affect: Mood normal.        Behavior: Behavior normal.         Assessment & Plan:  1. Bilateral sacroiliac joint dysfunction: 05/23/2021. Refilled: HYDROcodone 7.5/325mg  one tablet every 6 hrs as needed # 120. Second script sent for the following month. We will continue the opioid monitoring program, this consists of regular clinic visits, examinations, urine drug screen, pill counts as well as use of [place] Controlled Substance Reporting system. A 12 month History has been reviewed on the [place] Controlled Substance Reporting System on 05/23/2021.  2. Chronic pain syndrome consistent with fibromyalgia. Continue with exercise regime, heat therapy,voltaren gel, lidocaine patches and topamax. Continue current analgesics . 05/23/2021. 3. Lumbosacral Spondylosis/ Bilateral lumbar radiculopathy:Continue Elavil and Cymbalta. 05/23/2021,  4. Complex Regional Pain Syndrome: Continue Cymbalta. .Dr. Yolanda Bonine Following. 05/23/2021 5. Type 2 diabetes.: PCP Following. 05/23/2021 6. Muscle Spasms: Continue Tizanidine 4 mg one tablet every 8 hours as needed for muscle spasms. 05/23/2021 7. Insomnia: Continue Elavil. Continue to monitor. 05/23/2021 8.Cervical  Radiculopathy:No complaints today.  Continue Elavil: Continue to monitor. 05/23/2021 9. Cervicalgia/ Cervical Inflammation: Continue current medication regime. Alternate with heat and Ice Therapy. 05/23/2021 10. Bilateral Thoracic Pain:Continue HEP as Tolerated.  Continue current medication regime. Continue to Monitor. 05/23/2021 11. Bilateral  Shoulder Pain: Continue with HEP as Tolerated. Continue to Alternate Ice and Heat. Continue to monitor. Ortho Following. 05/23/2021.   12. Bilateral Hand and Bilateral Feet Neuropathic Pain:  Continue current  medication regimen. Continue HEP as tolerated. Continue to monitor. 05/23/2021 13. Polyarthralgia: Continue HEP as Tolerated. Continue to alternate Ice and Heat Therapy. Continue to monitor. 05/23/2021   F/U in 2 months

## 2021-05-31 ENCOUNTER — Telehealth: Payer: Self-pay | Admitting: *Deleted

## 2021-05-31 LAB — TOXASSURE SELECT,+ANTIDEPR,UR

## 2021-05-31 NOTE — Telephone Encounter (Signed)
Urine drug screen for this encounter is consistent for prescribed medication 

## 2021-06-07 ENCOUNTER — Emergency Department (HOSPITAL_COMMUNITY)
Admission: EM | Admit: 2021-06-07 | Discharge: 2021-06-07 | Disposition: A | Discharge status: Home / Self Care | Payer: Medicare Other | Attending: Emergency Medicine | Admitting: Emergency Medicine

## 2021-06-07 ENCOUNTER — Emergency Department (HOSPITAL_COMMUNITY): Payer: Medicare Other

## 2021-06-07 DIAGNOSIS — Z79899 Other long term (current) drug therapy: Secondary | ICD-10-CM | POA: Diagnosis not present

## 2021-06-07 DIAGNOSIS — R002 Palpitations: Secondary | ICD-10-CM | POA: Insufficient documentation

## 2021-06-07 DIAGNOSIS — E119 Type 2 diabetes mellitus without complications: Secondary | ICD-10-CM | POA: Diagnosis not present

## 2021-06-07 DIAGNOSIS — I10 Essential (primary) hypertension: Secondary | ICD-10-CM | POA: Insufficient documentation

## 2021-06-07 DIAGNOSIS — R0789 Other chest pain: Secondary | ICD-10-CM | POA: Insufficient documentation

## 2021-06-07 DIAGNOSIS — Z794 Long term (current) use of insulin: Secondary | ICD-10-CM | POA: Diagnosis not present

## 2021-06-07 DIAGNOSIS — R110 Nausea: Secondary | ICD-10-CM | POA: Insufficient documentation

## 2021-06-07 DIAGNOSIS — R079 Chest pain, unspecified: Secondary | ICD-10-CM

## 2021-06-07 DIAGNOSIS — Z7982 Long term (current) use of aspirin: Secondary | ICD-10-CM | POA: Insufficient documentation

## 2021-06-07 DIAGNOSIS — Z87891 Personal history of nicotine dependence: Secondary | ICD-10-CM | POA: Insufficient documentation

## 2021-06-07 LAB — CBC WITH DIFFERENTIAL/PLATELET
Abs Immature Granulocytes: 0.04 10*3/uL (ref 0.00–0.07)
Basophils Absolute: 0.1 10*3/uL (ref 0.0–0.1)
Basophils Relative: 1 %
Eosinophils Absolute: 0.1 10*3/uL (ref 0.0–0.5)
Eosinophils Relative: 1 %
HCT: 46.2 % — ABNORMAL HIGH (ref 36.0–46.0)
Hemoglobin: 14.9 g/dL (ref 12.0–15.0)
Immature Granulocytes: 0 %
Lymphocytes Relative: 33 %
Lymphs Abs: 3.2 10*3/uL (ref 0.7–4.0)
MCH: 28.3 pg (ref 26.0–34.0)
MCHC: 32.3 g/dL (ref 30.0–36.0)
MCV: 87.8 fL (ref 80.0–100.0)
Monocytes Absolute: 0.5 10*3/uL (ref 0.1–1.0)
Monocytes Relative: 5 %
Neutro Abs: 5.8 10*3/uL (ref 1.7–7.7)
Neutrophils Relative %: 60 %
Platelets: 348 10*3/uL (ref 150–400)
RBC: 5.26 MIL/uL — ABNORMAL HIGH (ref 3.87–5.11)
RDW: 12.6 % (ref 11.5–15.5)
WBC: 9.7 10*3/uL (ref 4.0–10.5)
nRBC: 0 % (ref 0.0–0.2)

## 2021-06-07 LAB — COMPREHENSIVE METABOLIC PANEL
ALT: 56 U/L — ABNORMAL HIGH (ref 0–44)
AST: 25 U/L (ref 15–41)
Albumin: 4.4 g/dL (ref 3.5–5.0)
Alkaline Phosphatase: 138 U/L — ABNORMAL HIGH (ref 38–126)
Anion gap: 10 (ref 5–15)
BUN: 14 mg/dL (ref 6–20)
CO2: 26 mmol/L (ref 22–32)
Calcium: 10.1 mg/dL (ref 8.9–10.3)
Chloride: 103 mmol/L (ref 98–111)
Creatinine, Ser: 0.65 mg/dL (ref 0.44–1.00)
GFR, Estimated: >60 mL/min (ref >60)
Glucose, Bld: 182 mg/dL — ABNORMAL HIGH (ref 70–99)
Potassium: 4 mmol/L (ref 3.5–5.1)
Sodium: 139 mmol/L (ref 135–145)
Total Bilirubin: 0.6 mg/dL (ref 0.3–1.2)
Total Protein: 7.5 g/dL (ref 6.5–8.1)

## 2021-06-07 LAB — TROPONIN I (HIGH SENSITIVITY)
Troponin I (High Sensitivity): 4 ng/L (ref <18)
Troponin I (High Sensitivity): 3 ng/L (ref <18)

## 2021-06-07 LAB — PROTIME-INR
INR: 1 (ref 0.8–1.2)
Prothrombin Time: 13 seconds (ref 11.4–15.2)

## 2021-06-07 LAB — D-DIMER, QUANTITATIVE: D-Dimer, Quant: <0.27 ug/mL-FEU (ref 0.00–0.50)

## 2021-06-07 MED ORDER — HYDROCODONE-ACETAMINOPHEN 5-325 MG PO TABS
1.0000 | ORAL_TABLET | Freq: Once | ORAL | Status: AC
  Administered 2021-06-07: 1 via ORAL
  Filled 2021-06-07: qty 1

## 2021-06-07 MED ORDER — SODIUM CHLORIDE 0.9 % IV BOLUS
1000.0000 mL | Freq: Once | INTRAVENOUS | Status: AC
  Administered 2021-06-07: 1000 mL via INTRAVENOUS

## 2021-06-07 MED ORDER — METOCLOPRAMIDE HCL 5 MG/ML IJ SOLN
5.0000 mg | Freq: Once | INTRAMUSCULAR | Status: AC
  Administered 2021-06-07: 5 mg via INTRAVENOUS
  Filled 2021-06-07: qty 2

## 2021-06-07 MED ORDER — DIPHENHYDRAMINE HCL 50 MG/ML IJ SOLN
25.0000 mg | Freq: Once | INTRAMUSCULAR | Status: AC
  Administered 2021-06-07: 25 mg via INTRAVENOUS
  Filled 2021-06-07: qty 1

## 2021-06-07 NOTE — ED Triage Notes (Signed)
Pt here via EMS. Here d/t CP X2 days intermittently with nausea.  Endorses SOB.    Pt self administered 324 ASA but vomited after.   10/68 HR 70 RR 18 SpO2 99% CBG 148

## 2021-06-07 NOTE — Discharge Instructions (Addendum)
You have been seen and discharged from the emergency department.  Your heart and lung work-up was normal.  Follow-up with your primary provider and cardiology for reevaluation and further care.  A Holter monitor can be prescribed to better evaluate your heart rhythm as an outpatient.  Take home medications as prescribed. If you have any worsening symptoms or further concerns for your health please return to an emergency department for further evaluation.

## 2021-06-07 NOTE — ED Provider Notes (Signed)
[facility] EMERGENCY DEPARTMENT Provider Note   CSN: [id number] Arrival date & time: 06/07/21  1134     History Chief Complaint  Patient presents with   Chest Pain    Shannon French is a 55 y.o. female.  This is a 55 y.o. female with significant medical history as below, including HLD, fibromyalgia who presents to the ED with complaint of abnormal heart rhythm on fit bit over the last few days (began Saturday)  Location:  heaviness, pressure to chest  Duration:  saturday Onset:  gradual Timing:  intermittent Description:  heaviness, pressure, squeezing Severity:  mild Exacerbating/Alleviating Factors:  none identified Associated Symptoms:  nausea which is chronic, palpitations Pertinent Negatives:  no fever or chills, stimulant use, ivdu, fall/loc, not lightheaded, no ha, no syncope.     The history is provided by the patient. No language interpreter was used.  Chest Pain Associated symptoms: nausea and palpitations   Associated symptoms: no abdominal pain, no cough, no dysphagia, no fever, no headache, no shortness of breath and no vomiting    HPI: A 55 year old patient with a history of treated diabetes, hypertension and hypercholesterolemia presents for evaluation of chest pain. Initial onset of pain was more than 6 hours ago. The patient's chest pain is described as heaviness/pressure/tightness, is sharp and is not worse with exertion. The patient's chest pain is not middle- or left-sided, is not well-localized and does not radiate to the arms/jaw/neck. The patient does not complain of nausea and denies diaphoresis. The patient has no history of stroke, has no history of peripheral artery disease, has not smoked in the past 90 days, has no relevant family history of coronary artery disease (first degree relative at less than age 25) and does not have an elevated BMI (>=30).   Past Medical History:  Diagnosis Date   Arm pain    left   Degenerative disc  disease    Diabetes mellitus    Fibromyalgia    Hyperlipidemia    Hypertension    IBS (irritable bowel syndrome)    Osteoporosis     Patient Active Problem List   Diagnosis Date Noted   Chronic left shoulder pain 05/11/2018   Adhesive capsulitis of left shoulder 03/09/2018   Chronic pain of both shoulders 01/05/2018   Chronic pain syndrome 01/05/2018   CRPS (complex regional pain syndrome), type I, upper 10/27/2013   Cervical disc disorder with radiculopathy of cervical region 10/27/2013   Lumbosacral spondylosis without myelopathy 12/13/2011   Muscle pain, fibromyalgia 12/13/2011   Bilateral sacroiliitis ([place]) 12/13/2011   Thoracic or lumbosacral neuritis or radiculitis, unspecified 12/13/2011    Past Surgical History:  Procedure Laterality Date   ABDOMINAL HYSTERECTOMY     ABDOMINAL SURGERY     APPENDECTOMY     CHOLECYSTECTOMY     CYST EXCISION     OVARY SURGERY       OB History   No obstetric history on file.     Family History  Problem Relation Age of Onset   Cancer Father     Social History   Tobacco Use   Smoking status: Former    Types: Cigarettes    Quit date: 10/14/2009    Years since quitting: 11.6   Smokeless tobacco: Never  Vaping Use   Vaping Use: Never used  Substance Use Topics   Alcohol use: No   Drug use: No    Home Medications Prior to Admission medications   Medication Sig Start Date End Date  Taking? Authorizing Provider  amitriptyline (ELAVIL) 50 MG tablet TAKE 1 TABLET BY MOUTH EVERYDAY AT BEDTIME 10/20/19   Bayard Hugger, NP  Apremilast (OTEZLA) 30 MG TABS Take 30 mg by mouth 2 (two) times daily.    [provider]  aspirin EC 81 MG tablet Take 81 mg by mouth daily.    [provider]  carbamazepine (TEGRETOL) 200 MG tablet TAKE 1 TABLET BY MOUTH TWICE A DAY 12/14/20   Meredith Staggers, MD  clobetasol ointment (TEMOVATE) [id number] % Apply 1 application topically 2 (two) times daily. Patient not taking: Reported on  05/23/2021    [provider]  clobetasol ointment (TEMOVATE) 0.05 % clobetasol 0.05 % topical ointment  APPLY TOPICALLY TWICE A DAY Patient not taking: Reported on 05/23/2021    [provider]  DHEA 25 MG CAPS Take 25 mg by mouth daily.    [provider]  diclofenac (VOLTAREN) 75 MG EC tablet TAKE 1 TABLET BY MOUTH TWICE A DAY Patient taking differently: 75 mg 1 day or 1 dose. One tablet daily 01/17/20   Meredith Staggers, MD  diclofenac sodium (VOLTAREN) 1 % GEL Apply 2 g topically 4 (four) times daily. 07/08/18   Meredith Staggers, MD  dicyclomine (BENTYL) 10 MG capsule Take 10 mg by mouth 4 (four) times daily.    [provider]  DULoxetine (CYMBALTA) 30 MG capsule TAKE 1 CAPSULE BY MOUTH EVERY DAY 12/14/20   Bayard Hugger, NP  DULoxetine (CYMBALTA) 60 MG capsule Take 1 capsule (60 mg total) by mouth daily. 01/04/21   Bayard Hugger, NP  folic acid (FOLVITE) 1 MG tablet Take 1 mg by mouth daily.    [provider]  HYDROcodone-acetaminophen (NORCO) 7.5-325 MG tablet Take 1 tablet by mouth every 6 (six) hours as needed for moderate pain. 05/23/21   Bayard Hugger, NP  insulin lispro (HUMALOG) 100 UNIT/ML injection Inject into the skin as needed.    [provider]  LANTUS SOLOSTAR 100 UNIT/ML Solostar Pen Inject 20 Units into the skin at bedtime.  01/26/15   [provider]  lidocaine (LIDODERM) 5 % Place 1 patch onto the skin daily. Remove & Discard patch within 12 hours or as directed by MD 07/26/19   Bayard Hugger, NP  lisinopril (PRINIVIL,ZESTRIL) 10 MG tablet Take 10 mg by mouth daily.    [provider]  methimazole (TAPAZOLE) 5 MG tablet Take 1 tablet by mouth daily. 07/13/20   [provider]  methotrexate (RHEUMATREX) 2.5 MG tablet Take 2.5 mg by mouth once a week. Caution:Chemotherapy. Protect from light. 4 tablets once a week Patient not taking: Reported on 05/23/2021    [provider]   metoprolol succinate (TOPROL-XL) 25 MG 24 hr tablet Take 12.5 mg by mouth daily. Take 1/2 tablet daily    [provider]  ONE TOUCH ULTRA TEST test strip  07/15/12   [provider]  pantoprazole (PROTONIX) 40 MG tablet Take 40 mg by mouth daily.     [provider]  PROAIR HFA 108 (90 BASE) MCG/ACT inhaler Inhale 2 puffs into the lungs 4 (four) times daily as needed. 01/19/12   Slatosky, Marshall Cork., MD  promethazine (PHENERGAN) 25 MG tablet Take 25 mg by mouth every 6 (six) hours as needed.    [provider]  risedronate (ACTONEL) 150 MG tablet Take 150 mg by mouth daily. with water on empty stomach, nothing by mouth or lie down for next  30 minutes.    [provider]  Rosuvastatin Calcium 20 MG CPSP Take 20 mg by mouth daily.    [provider]  Semaglutide,0.25 or 0.5MG /DOS, (OZEMPIC, 0.25 OR 0.5 MG/DOSE,) 2 MG/1.5ML SOPN Inject 0.25 mg into the skin. [place] weekly    [provider]  tiZANidine (ZANAFLEX) 4 MG tablet TAKE 1 TABLET (4 MG TOTAL) BY MOUTH EVERY 8 (EIGHT) HOURS AS NEEDED FOR MUSCLE SPASMS 11/20/20   Bayard Hugger, NP  valACYclovir (VALTREX) 1000 MG tablet  09/24/14   [provider]    Allergies    Gabapentin [gabapentin], Actos [pioglitazone hydrochloride], Esomeprazole magnesium, Glucophage [metformin hydrochloride], Iodinated diagnostic agents, Mepivacaine hcl, Other, and Sulfa antibiotics  Review of Systems   Review of Systems  Constitutional:  Negative for chills and fever.  HENT:  Negative for facial swelling and trouble swallowing.   Eyes:  Negative for photophobia and visual disturbance.  Respiratory:  Negative for cough and shortness of breath.   Cardiovascular:  Positive for chest pain and palpitations.  Gastrointestinal:  Positive for nausea. Negative for abdominal pain and vomiting.  Endocrine: Negative for polydipsia and polyuria.  Genitourinary:  Negative for difficulty urinating and  hematuria.  Musculoskeletal:  Negative for gait problem and joint swelling.  Skin:  Negative for pallor and rash.  Neurological:  Negative for syncope and headaches.  Psychiatric/Behavioral:  Negative for agitation and confusion.    Physical Exam Updated Vital Signs BP 108/75 (BP Location: Right Arm)   Pulse 81   Temp 97.7 F (36.5 C)   Resp (!) 30   Ht 5\' 3"  (1.6 m)   Wt 54 kg   SpO2 94%   BMI 21.09 kg/m   Physical Exam Vitals and nursing note reviewed.  Constitutional:      General: She is not in acute distress.    Appearance: Normal appearance.  HENT:     Head: Normocephalic and atraumatic.     Right Ear: External ear normal.     Left Ear: External ear normal.     Nose: Nose normal.     Mouth/Throat:     Mouth: Mucous membranes are moist.  Eyes:     General: No scleral icterus.       Right eye: No discharge.        Left eye: No discharge.  Cardiovascular:     Rate and Rhythm: Normal rate and regular rhythm.     Pulses: Normal pulses.          Radial pulses are 2+ on the right side and 2+ on the left side.       Dorsalis pedis pulses are 2+ on the right side and 2+ on the left side.     Heart sounds: Normal heart sounds. No murmur heard. Pulmonary:     Effort: Pulmonary effort is normal. No respiratory distress.     Breath sounds: Normal breath sounds.  Abdominal:     General: Abdomen is flat.     Tenderness: There is no abdominal tenderness.  Musculoskeletal:        General: Normal range of motion.     Cervical back: Normal range of motion.     Right lower leg: No edema.     Left lower leg: No edema.  Skin:    General: Skin is warm and dry.     Capillary Refill: Capillary refill takes less than 2 seconds.  Neurological:     Mental Status: She is alert and oriented to  person, place, and time.     GCS: GCS eye subscore is 4. GCS verbal subscore is 5. GCS motor subscore is 6.  Psychiatric:        Mood and Affect: Mood normal.        Behavior: Behavior  normal.    ED Results / Procedures / Treatments   Labs (all labs ordered are listed, but only abnormal results are displayed) Labs Reviewed  COMPREHENSIVE METABOLIC PANEL - Abnormal; Notable for the following components:      Result Value   Glucose, Bld 182 (*)    ALT 56 (*)    Alkaline Phosphatase 138 (*)    All other components within normal limits  CBC WITH DIFFERENTIAL/PLATELET - Abnormal; Notable for the following components:   RBC 5.26 (*)    HCT 46.2 (*)    All other components within normal limits  D-DIMER, QUANTITATIVE  PROTIME-INR  TROPONIN I (HIGH SENSITIVITY)  TROPONIN I (HIGH SENSITIVITY)    EKG EKG Interpretation  Date/Time:  Thursday June 07 2021 12:23:48 EDT Ventricular Rate:  73 PR Interval:  156 QRS Duration: 89 QT Interval:  373 QTC Calculation: 411 R Axis:   84 Text Interpretation: Sinus rhythm No prior tracing available Confirmed by Tanda Rockers ([id number]) on 06/07/2021 1:13:23 PM  Radiology DG Chest 2 View  Result Date: 06/07/2021 CLINICAL DATA:  Chest pain. EXAM: CHEST - 2 VIEW COMPARISON:  None. FINDINGS: The heart size and mediastinal contours are within normal limits. Both lungs are clear. The visualized skeletal structures are unremarkable. Negative for a pneumothorax. Evidence for cholecystectomy clips. IMPRESSION: No active cardiopulmonary disease. Electronically Signed   By: Richarda Overlie M.D.   On: 06/07/2021 12:27    Procedures Procedures   Medications Ordered in ED Medications  metoCLOPramide (REGLAN) injection 5 mg (5 mg Intravenous Given 06/07/21 1202)  diphenhydrAMINE (BENADRYL) injection 25 mg (25 mg Intravenous Given 06/07/21 1205)  sodium chloride 0.9 % bolus 1,000 mL (0 mLs Intravenous Stopped 06/07/21 1453)  HYDROcodone-acetaminophen (NORCO/VICODIN) 5-325 MG per tablet 1 tablet (1 tablet Oral Given 06/07/21 1205)    ED Course  I have reviewed the triage vital signs and the nursing notes.  Pertinent labs & imaging results that were  available during my care of the patient were reviewed by me and considered in my medical decision making (see chart for details).    MDM Rules/Calculators/A&P HEAR Score: 3                         CC: cp, fit bit rhythm abnormality   This patient complains of cp; this involves an extensive number of treatment options and is a complaint that carries with it a high risk of complications and morbidity. Vital signs were reviewed. Serious etiologies considered.  Record review:  Previous records obtained and reviewed   Work up as above, notable for:  Labs & imaging results that were available during my care of the patient were reviewed by me and considered in my medical decision making.   I ordered imaging studies which included CXR and I independently visualized and interpreted imaging which showed no acute process  Wells score is low, D-dimer is negative.  PE is unlikely.  Management: Patient with chronic nausea, and she attempted to take aspirin earlier which made her nausea worse.  Patient given IV antiemetics.  Also given home oral pain medication.   EKG with evidence acute ischemia.  Cardiac telemetry reviewed by myself, no abnormal  heart rhythm, no atrial fibrillation, evidence of heart block.  NSR.    At this time recommend patient follow-up with PCP, cardiologist for outpatient Holter/event monitor.  Strict return precautions were discussed.   The patient's chest pain is not suggestive of pulmonary embolus, cardiac ischemia, aortic dissection, pericarditis, myocarditis, pulmonary embolism, pneumothorax, pneumonia, Zoster, or esophageal perforation, or other serious etiology.  Historically not abrupt in onset, tearing or ripping, pulses symmetric. EKG nonspecific for ischemia/infarction. No dysrhythmias, brugada, WPW, prolonged QT noted. Troponin negative x2. CXR reviewed. Labs without demonstration of acute pathology unless otherwise noted above. Low HEART Score: 0-3 points  (0.9-1.7% risk of MACE). Given the extremely low risk of these diagnoses further testing and evaluation for these possibilities does not appear to be indicated at this time. Patient in no distress and overall condition improved here in the ED. Detailed discussions were had with the patient regarding current findings, and need for close f/u with PCP or on call doctor. The patient has been instructed to return immediately if the symptoms worsen in any way for re-evaluation. Patient verbalized understanding and is in agreement with current care plan. All questions answered prior to discharge.     This chart was dictated using voice recognition software.  Despite best efforts to proofread,  errors can occur which can change the documentation meaning.  Final Clinical Impression(s) / ED Diagnoses Final diagnoses:  Chest pain with low risk for cardiac etiology    Rx / DC Orders ED Discharge Orders     None        Jeanell Sparrow, DO 06/07/21 1745

## 2021-06-07 NOTE — ED Provider Notes (Signed)
Patient signed out to me by previous provider. Please refer to their note for full HPI.  Briefly this is a 55 year old female who presented to the emergency department with intermittent chest pain and her Apple Watch saying that her heart rhythm was abnormal.  So far her cardiac work-up has been reassuring, negative troponin, D-dimer negative.  We are pending repeat troponin and reevaluation. Physical Exam  BP 108/75 (BP Location: Right Arm)   Pulse 81   Temp 98.3 F (36.8 C) (Oral)   Resp (!) 30   Ht 5\' 3"  (1.6 m)   Wt 54 kg   SpO2 94%   BMI 21.09 kg/m   Physical Exam  ED Course/Procedures     Procedures  MDM   Repeat troponin is negative with no significant delta.  She is chest pain-free, has been in normal sinus rhythm the entire time that she is been monitored here in the ER.  No signs of atrial fibrillation or arrhythmia.  Plan for outpatient follow-up with her primary doctor/cardiology, recommend Holter monitoring with strict return to ED precautions.  Patient at this time appears safe and stable for discharge and will be treated as an outpatient.  Discharge plan and strict return to ED precautions discussed, patient verbalizes understanding and agreement.       , DO 06/07/21 1721

## 2021-07-23 ENCOUNTER — Encounter: Payer: Self-pay | Admitting: Registered Nurse

## 2021-07-23 ENCOUNTER — Encounter: Payer: Medicare Other | Attending: Physical Medicine & Rehabilitation | Admitting: Registered Nurse

## 2021-07-23 ENCOUNTER — Other Ambulatory Visit: Payer: Self-pay

## 2021-07-23 VITALS — BP 106/74 | HR 74 | Temp 98.4°F | Ht 63.0 in | Wt 117.4 lb

## 2021-07-23 DIAGNOSIS — M47817 Spondylosis without myelopathy or radiculopathy, lumbosacral region: Secondary | ICD-10-CM | POA: Insufficient documentation

## 2021-07-23 DIAGNOSIS — G609 Hereditary and idiopathic neuropathy, unspecified: Secondary | ICD-10-CM | POA: Insufficient documentation

## 2021-07-23 DIAGNOSIS — M546 Pain in thoracic spine: Secondary | ICD-10-CM | POA: Insufficient documentation

## 2021-07-23 DIAGNOSIS — Z5181 Encounter for therapeutic drug level monitoring: Secondary | ICD-10-CM | POA: Diagnosis present

## 2021-07-23 DIAGNOSIS — M542 Cervicalgia: Secondary | ICD-10-CM | POA: Insufficient documentation

## 2021-07-23 DIAGNOSIS — M2550 Pain in unspecified joint: Secondary | ICD-10-CM | POA: Diagnosis present

## 2021-07-23 DIAGNOSIS — G894 Chronic pain syndrome: Secondary | ICD-10-CM | POA: Diagnosis present

## 2021-07-23 DIAGNOSIS — G8929 Other chronic pain: Secondary | ICD-10-CM | POA: Insufficient documentation

## 2021-07-23 DIAGNOSIS — M25512 Pain in left shoulder: Secondary | ICD-10-CM | POA: Diagnosis present

## 2021-07-23 DIAGNOSIS — M25511 Pain in right shoulder: Secondary | ICD-10-CM | POA: Diagnosis present

## 2021-07-23 MED ORDER — DULOXETINE HCL 30 MG PO CPEP: ORAL_CAPSULE | ORAL | 1 refills | Status: DC

## 2021-07-23 MED ORDER — HYDROCODONE-ACETAMINOPHEN 7.5-325 MG PO TABS: 1.0000 | ORAL_TABLET | Freq: Four times a day (QID) | ORAL | 0 refills | Status: DC | PRN

## 2021-07-23 MED ORDER — DULOXETINE HCL 60 MG PO CPEP: 60.0000 mg | ORAL_CAPSULE | Freq: Every day | ORAL | 2 refills | Status: DC

## 2021-07-23 NOTE — Progress Notes (Signed)
Subjective:    Patient ID: Shannon French, female    DOB: March 22, 1966, 55 y.o.   MRN: [id number]  HPI: Shannon French is a 55 y.o. female who returns for follow up appointment for chronic pain and medication refill. She states her pain is located in her neck. Bilateral shoulders, mid-back and generalize joint pain. She also reports tingling and burning in her bilateral hands and bilateral feet with tingling and burning. She rates her pain 7. Her current exercise regime is walking and performing stretching exercises.  Ms. Kilcrease Morphine equivalent is 30.00 MME.   Last UDS was Performed on 05/23/2021, it was consistent.     Pain Inventory Average Pain 7 Pain Right Now 7 My pain is constant, sharp, burning, dull, stabbing, tingling, and aching  In the last 24 hours, has pain interfered with the following? General activity 8 Relation with others 8 Enjoyment of life 8 What TIME of day is your pain at its worst? morning , daytime, evening, and night Sleep (in general) Fair  Pain is worse with: walking, bending, sitting, inactivity, standing, and some activites Pain improves with: rest, heat/ice, therapy/exercise, pacing activities, and medication Relief from Meds: 4  Family History  Problem Relation Age of Onset   Cancer Father    Social History   Socioeconomic History   Marital status: Single    Spouse name: Not on file   Number of children: Not on file   Years of education: Not on file   Highest education level: Not on file  Occupational History   Not on file  Tobacco Use   Smoking status: Former    Types: Cigarettes    Quit date: 10/14/2009    Years since quitting: 11.7   Smokeless tobacco: Never  Vaping Use   Vaping Use: Never used  Substance and Sexual Activity   Alcohol use: No   Drug use: No   Sexual activity: Not on file  Other Topics Concern   Not on file  Social History Narrative   Not on file   Social Determinants of Health   Financial Resource Strain: Not  on file  Food Insecurity: Not on file  Transportation Needs: Not on file  Physical Activity: Not on file  Stress: Not on file  Social Connections: Not on file   Past Surgical History:  Procedure Laterality Date   ABDOMINAL HYSTERECTOMY     ABDOMINAL SURGERY     APPENDECTOMY     CHOLECYSTECTOMY     CYST EXCISION     OVARY SURGERY     Past Surgical History:  Procedure Laterality Date   ABDOMINAL HYSTERECTOMY     ABDOMINAL SURGERY     APPENDECTOMY     CHOLECYSTECTOMY     CYST EXCISION     OVARY SURGERY     Past Medical History:  Diagnosis Date   Arm pain    left   Degenerative disc disease    Diabetes mellitus    Fibromyalgia    Hyperlipidemia    Hypertension    IBS (irritable bowel syndrome)    Osteoporosis    BP 106/74    Pulse 74    Temp 98.4 F (36.9 C)    Ht 5\' 3"  (1.6 m)    Wt 117 lb 6.4 oz (53.3 kg)    SpO2 97%    BMI 20.80 kg/m   Opioid Risk Score:   Fall Risk Score:  `1  Depression screen PHQ 2/9  Depression screen [facility] 2/9 07/23/2021 05/23/2021  11/16/2020 09/19/2020 02/02/2020 06/01/2019 11/04/2018  Decreased Interest 0 0 0 0 0 0 0  Down, Depressed, Hopeless 0 0 0 0 0 - 0  PHQ - 2 Score 0 0 0 0 0 0 0  Altered sleeping - - - - - - -  Tired, decreased energy - - - - - - -  Change in appetite - - - - - - -  Feeling bad or failure about yourself  - - - - - - -  Trouble concentrating - - - - - - -  Moving slowly or fidgety/restless - - - - - - -  Suicidal thoughts - - - - - - -  PHQ-9 Score - - - - - - -     Review of Systems  Constitutional: Negative.   HENT: Negative.    Eyes: Negative.   Respiratory: Negative.    Cardiovascular: Negative.   Gastrointestinal: Negative.   Endocrine: Negative.   Genitourinary: Negative.   Musculoskeletal:  Positive for back pain and neck pain.  Skin: Negative.   Allergic/Immunologic: Negative.   Neurological: Negative.   Psychiatric/Behavioral: Negative.        Objective:   Physical Exam Vitals and nursing  note reviewed.  Constitutional:      Appearance: Normal appearance.  Neck:     Comments: Cervical Paraspinal Tenderness: C-5-C-6 Cardiovascular:     Rate and Rhythm: Normal rate and regular rhythm.     Pulses: Normal pulses.     Heart sounds: Normal heart sounds.  Pulmonary:     Effort: Pulmonary effort is normal.     Breath sounds: Normal breath sounds.  Musculoskeletal:     Cervical back: Normal range of motion and neck supple.     Comments: Normal Muscle Bulk and Muscle Testing Reveals:  Upper Extremities: Full ROM and Muscle Strength 5/5 Bilateral AC Joint Tenderness  Thoracic Paraspinal Tenderness: T-7-T-9  Lumbar Paraspinal Tenderness: L-4-L-5 Lower Extremities: Full ROM and Muscle Strength 5/5 Arises from Table with Ease  Narrow Based  Gait     Skin:    General: Skin is warm and dry.  Neurological:     Mental Status: She is alert and oriented to person, place, and time.  Psychiatric:        Mood and Affect: Mood normal.        Behavior: Behavior normal.         Assessment & Plan:  1. Bilateral sacroiliac joint dysfunction: 07/23/2021. Refilled: HYDROcodone 7.5/325mg  one tablet every 6 hrs as needed # 120. Second script sent for the following month. We will continue the opioid monitoring program, this consists of regular clinic visits, examinations, urine drug screen, pill counts as well as use of [place] Controlled Substance Reporting system. A 12 month History has been reviewed on the [place] Controlled Substance Reporting System on 07/23/2021.  2. Chronic pain syndrome consistent with fibromyalgia. Continue with exercise regime, heat therapy,voltaren gel, lidocaine patches and topamax. Continue current analgesics . 07/23/2021. 3. Lumbosacral Spondylosis/ Bilateral lumbar radiculopathy:Continue Elavil and Cymbalta. 07/23/2021,  4. Complex Regional Pain Syndrome: Continue Cymbalta. .Dr. Yolanda Bonine Following. 07/23/2021 5. Type 2 diabetes.: PCP Following.  07/23/2021 6. Muscle Spasms: Continue Tizanidine 4 mg one tablet every 8 hours as needed for muscle spasms. 07/23/2021 7. Insomnia: Continue Elavil. Continue to monitor. 07/23/2021 8.Cervical Radiculopathy:No complaints today.  Continue Elavil: Continue to monitor. 07/23/2021 9. Cervicalgia/ Cervical Inflammation: Continue current medication regime. Alternate with heat and Ice Therapy. 07/23/2021 10. Bilateral Thoracic Pain:Continue HEP  as Tolerated.  Continue current medication regime. Continue to Monitor. 07/23/2021 11. Bilateral  Shoulder Pain: Continue with HEP as Tolerated. Continue to Alternate Ice and Heat. Continue to monitor. Ortho Following. 07/23/2021.   12. Bilateral Hand and Bilateral Feet Neuropathic Pain:  Continue current medication regimen. Continue HEP as tolerated. Continue to monitor. 07/23/2021 13. Polyarthralgia: Continue HEP as Tolerated. Continue to alternate Ice and Heat Therapy. Continue to monitor. 07/23/2021   F/U in 2 months

## 2021-08-02 NOTE — Progress Notes (Signed)
Cardiology Office Note:   Date:  08/03/2021  NAME:  Shannon French    MRN: [id number] DOB:  10/05/1965   PCP:  Shannon Done., MD  Cardiologist:  None  Electrophysiologist:  None   Referring MD: Shannon Done., MD   Chief Complaint  Patient presents with   Chest Pain    History of Present Illness:   Shannon French is a 55 y.o. female with a hx of DM, HTN, HLD who is being seen today for the evaluation of chest pain/palpitations at the request of Shannon Done., MD. Seen in ER 06/07/2021 for chest pain and palpitations. Negative troponins, normal CXR. EKG normal. She reports she has had several episodes of chest pain.  She reports 4 episodes of left chest pain.  Describes sharp pain in her chest.  It occurs while sitting.  It can last up to 20 minutes.  She reports no triggers or alleviating factors.  She had 3 episodes that occur at night that wake her up.  She is bothered by this.  She is approximately has no exertional chest pain symptoms.  Her symptoms are not worsened by exertion or alleviated by rest.  She does feel nauseated with this.  CVD risk factors include diabetes that is well controlled with an A1c of 6.4.  She has had diabetes for over 30 years.  She was recently started back on Crestor as her values were elevated.  She does report a history of medication induced high blood pressure.  BP today in office is 96/68.  She is on lisinopril and metoprolol.  We discussed just taking low-dose lisinopril.  She reports her mother had heart disease.  Her sister and brother have heart disease as well.  Her brother died from a heart attack.  She personally has never had a heart attack or stroke.  She is a former smoker of 10 years.  She does not use drugs or drink alcohol.  She does have rheumatoid arthritis and is disabled.  Her EKG in office demonstrates normal sinus rhythm with no acute ischemic changes or evidence of infarction.  She has 1 daughter.  She is not married.  She does report CT  contrast allergy.  Apparently 25 years ago she had a bad reaction.  She has had CT scans since then and not had as severe of reactions.  She does report intermittent episodes of palpitations.  They do not appear bothersome.  She reports she notices it on her Fitbit.  She really is more bothered by her chest pain.  Problem List DM -A1c 6.4 2. HTN 3. HLD 4. RA  Past Medical History: Past Medical History:  Diagnosis Date   Arm pain    left   Degenerative disc disease    Diabetes mellitus    Fibromyalgia    Hyperlipidemia    Hypertension    IBS (irritable bowel syndrome)    Osteoporosis     Past Surgical History: Past Surgical History:  Procedure Laterality Date   ABDOMINAL HYSTERECTOMY     ABDOMINAL SURGERY     APPENDECTOMY     CHOLECYSTECTOMY     CYST EXCISION     OVARY SURGERY      Current Medications: Current Meds  Medication Sig   amitriptyline (ELAVIL) 50 MG tablet TAKE 1 TABLET BY MOUTH EVERYDAY AT BEDTIME   Apremilast (OTEZLA) 30 MG TABS Take 30 mg by mouth 2 (two) times daily.   aspirin EC 81 MG tablet Take 81 mg by  mouth daily.   carbamazepine (TEGRETOL) 200 MG tablet TAKE 1 TABLET BY MOUTH TWICE A DAY   clobetasol ointment (TEMOVATE) 0.05 % Apply 1 application topically 2 (two) times daily.   clobetasol ointment (TEMOVATE) 0.05 %    DHEA 25 MG CAPS Take 25 mg by mouth daily.   diclofenac (VOLTAREN) 75 MG EC tablet TAKE 1 TABLET BY MOUTH TWICE A DAY (Patient taking differently: 75 mg 1 day or 1 dose. One tablet daily)   diclofenac sodium (VOLTAREN) 1 % GEL Apply 2 g topically 4 (four) times daily.   dicyclomine (BENTYL) 10 MG capsule Take 10 mg by mouth 4 (four) times daily.   DULoxetine (CYMBALTA) 30 MG capsule TAKE 1 CAPSULE BY MOUTH EVERY DAY   DULoxetine (CYMBALTA) 60 MG capsule Take 1 capsule (60 mg total) by mouth daily.   folic acid (FOLVITE) 1 MG tablet Take 1 mg by mouth daily.   HYDROcodone-acetaminophen (NORCO) 7.5-325 MG tablet Take 1 tablet by  mouth every 6 (six) hours as needed for moderate pain.   insulin lispro (HUMALOG) 100 UNIT/ML injection Inject into the skin as needed.   LANTUS SOLOSTAR 100 UNIT/ML Solostar Pen Inject 18 Units into the skin at bedtime.   lidocaine (LIDODERM) 5 % Place 1 patch onto the skin daily. Remove & Discard patch within 12 hours or as directed by MD   methimazole (TAPAZOLE) 5 MG tablet Take 1 tablet by mouth daily.   metoprolol tartrate (LOPRESSOR) 50 MG tablet Take 1 tablet by mouth once for procedure.   ONE TOUCH ULTRA TEST test strip    pantoprazole (PROTONIX) 40 MG tablet Take 40 mg by mouth daily.    predniSONE (DELTASONE) 50 MG tablet Take 1 Tablet every 6 hours starting the night before the CT. Take last dose before you leave for the test.   PROAIR HFA 108 (90 BASE) MCG/ACT inhaler Inhale 2 puffs into the lungs 4 (four) times daily as needed.   promethazine (PHENERGAN) 25 MG tablet Take 25 mg by mouth every 6 (six) hours as needed.   risedronate (ACTONEL) 150 MG tablet Take 150 mg by mouth daily. with water on empty stomach, nothing by mouth or lie down for next 30 minutes.   Rosuvastatin Calcium 20 MG CPSP Take 20 mg by mouth daily.   Semaglutide,0.25 or 0.5MG /DOS, (OZEMPIC, 0.25 OR 0.5 MG/DOSE,) 2 MG/1.5ML SOPN Inject 0.25 mg into the skin. [place] weekly   tiZANidine (ZANAFLEX) 4 MG tablet TAKE 1 TABLET (4 MG TOTAL) BY MOUTH EVERY 8 (EIGHT) HOURS AS NEEDED FOR MUSCLE SPASMS   valACYclovir (VALTREX) 1000 MG tablet    [DISCONTINUED] lisinopril (PRINIVIL,ZESTRIL) 10 MG tablet Take 10 mg by mouth daily.   [DISCONTINUED] metoprolol succinate (TOPROL-XL) 25 MG 24 hr tablet Take 12.5 mg by mouth daily. Take 1/2 tablet daily     Allergies:    Gabapentin [gabapentin], Actos [pioglitazone hydrochloride], Esomeprazole magnesium, Glucophage [metformin hydrochloride], Iodinated contrast media, Mepivacaine hcl, Other, and Sulfa antibiotics   Social History: Social History   Socioeconomic History    Marital status: Significant Other    Spouse name: Not on file   Number of children: 1   Years of education: Not on file   Highest education level: Not on file  Occupational History   Occupation: Disabled  Tobacco Use   Smoking status: Former    Years: 10.00    Types: Cigarettes    Quit date: 10/14/2009    Years since quitting: 11.8   Smokeless tobacco: Never  Vaping Use   Vaping Use: Never used  Substance and Sexual Activity   Alcohol use: No   Drug use: No   Sexual activity: Not on file  Other Topics Concern   Not on file  Social History Narrative   Not on file   Social Determinants of Health   Financial Resource Strain: Not on file  Food Insecurity: Not on file  Transportation Needs: Not on file  Physical Activity: Not on file  Stress: Not on file  Social Connections: Not on file     Family History: The patient's family history includes Cancer in her father; Heart attack in her brother and mother.  ROS:   All other ROS reviewed and negative. Pertinent positives noted in the HPI.     EKGs/Labs/Other Studies Reviewed:   The following studies were personally reviewed by me today:  EKG:  EKG is ordered today.  The ekg ordered today demonstrates normal sinus rhythm heart rate 82, no acute ischemic changes or evidence of infarction, and was personally reviewed by me.   Recent Labs: 06/07/2021: ALT 56; BUN 14; Creatinine, Ser 0.65; Hemoglobin 14.9; Platelets 348; Potassium 4.0; Sodium 139   Recent Lipid Panel No results found for: CHOL, TRIG, HDL, CHOLHDL, VLDL, LDLCALC, LDLDIRECT  Physical Exam:   VS:  BP 96/68 (BP Location: Left Arm, Patient Position: Sitting, Cuff Size: Normal)    Pulse 89    Resp 20    Ht 5\' 3"  (1.6 m)    Wt 116 lb (52.6 kg)    SpO2 98%    BMI 20.55 kg/m    Wt Readings from Last 3 Encounters:  08/03/21 116 lb (52.6 kg)  07/23/21 117 lb 6.4 oz (53.3 kg)  06/07/21 119 lb 0.8 oz (54 kg)    General: Well nourished, well developed, in no acute  distress Head: Atraumatic, normal size  Eyes: PEERLA, EOMI  Neck: Supple, no JVD Endocrine: No thryomegaly Cardiac: Normal S1, S2; RRR; no murmurs, rubs, or gallops Lungs: Clear to auscultation bilaterally, no wheezing, rhonchi or rales  Abd: Soft, nontender, no hepatomegaly  Ext: No edema, pulses 2+ Musculoskeletal: No deformities, BUE and BLE strength normal and equal Skin: Warm and dry, no rashes   Neuro: Alert and oriented to person, place, time, and situation, CNII-XII grossly intact, no focal deficits  Psych: Normal mood and affect   ASSESSMENT:   Mane Consolo is a 55 y.o. female who presents for the following: 1. Precordial pain   2. Palpitations   3. Primary hypertension     PLAN:   1. Precordial pain -Several episodes of sharp chest pressure.  Occur at rest.  Not exertional.  Not alleviated by rest.  EKG in office is normal.  Recent ER visit with normal lab values including high-sensitivity troponin values.  Cardiovascular exam is normal. -No recent fevers or chills.  No stress reported. -This possibly represents cardiac etiology.  It is reassuring that her EKG is normal.  I would like for her to obtain a coronary CTA.  She will take 100 mg of metoprolol tartrate 2 hours before the scan.  She will need prednisone premedication with 50 mg 13 hours, 7 hours and 1 hour prior to the scan.  I suspect she had a bad reaction to early generation CT contrast. -I would also like to obtain an echocardiogram. - continue aspirin she will and statin medication in the interim.  2. Palpitations -Incidentally found on her Fitbit.  No symptoms from this.  We will  continue to monitor this.  3. Primary hypertension -Blood pressure is a bit low today in office.  I do not believe she needs to be on this blood pressure medication.  Would recommend she reduce her lisinopril to 2.5 mg daily.  She is on this for renal protection in setting of diabetes.  We will stop her metoprolol  succinate.  Disposition: Return if symptoms worsen or fail to improve.  Medication Adjustments/Labs and Tests Ordered: Current medicines are reviewed at length with the patient today.  Concerns regarding medicines are outlined above.  Orders Placed This Encounter  Procedures   CT CORONARY MORPH W/CTA COR W/SCORE W/CA W/CM &/OR WO/CM   Basic metabolic panel   EKG 12-Lead   ECHOCARDIOGRAM COMPLETE   Meds ordered this encounter  Medications   metoprolol tartrate (LOPRESSOR) 50 MG tablet    Sig: Take 1 tablet by mouth once for procedure.    Dispense:  1 tablet    Refill:  0   lisinopril (ZESTRIL) 2.5 MG tablet    Sig: Take 1 tablet (2.5 mg total) by mouth daily.    Dispense:  90 tablet    Refill:  1   predniSONE (DELTASONE) 50 MG tablet    Sig: Take 1 Tablet every 6 hours starting the night before the CT. Take last dose before you leave for the test.    Dispense:  3 tablet    Refill:  0    Patient Instructions  Medication Instructions:  Decrease Lisinopril to 2.5 mg daily   Take Metoprolol 100 mg two hours before CT when scheduled.   STOP Metoprolol Succinate   *If you need a refill on your cardiac medications before your next appointment, please call your pharmacy*   Lab Work: BMET today  If you have labs (blood work) drawn today and your tests are completely normal, you will receive your results only by: MyChart Message (if you have MyChart) OR A paper copy in the mail If you have any lab test that is abnormal or we need to change your treatment, we will call you to review the results.   Testing/Procedures: Echocardiogram - Your physician has requested that you have an echocardiogram. Echocardiography is a painless test that uses sound waves to create images of your heart. It provides your doctor with information about the size and shape of your heart and how well your hearts chambers and valves are working. This procedure takes approximately one hour. There are no  restrictions for this procedure. This will be performed at either our [facility] location - [address], [address]  -or- [facility] location [facility] 2nd floor.  Your physician has requested that you have cardiac CT. Cardiac computed tomography (CT) is a painless test that uses an x-ray machine to take clear, detailed pictures of your heart. For further information please visit [email or web address] Please follow instruction sheet as given.    Follow-Up: At [facility], you and your health needs are our priority.  As part of our continuing mission to provide you with exceptional heart care, we have created designated Provider Care Teams.  These Care Teams include your primary Cardiologist (physician) and Advanced Practice Providers (APPs -  Physician Assistants and Nurse Practitioners) who all work together to provide you with the care you need, when you need it.  We recommend signing up for the patient portal called "MyChart".  Sign up information is provided on this After Visit Summary.  MyChart is used to connect with  patients for Virtual Visits (Telemedicine).  Patients are able to view lab/test results, encounter notes, upcoming appointments, etc.  Non-urgent messages can be sent to your provider as well.   To learn more about what you can do with MyChart, go to [email or web address].    Your next appointment:   As needed  The format for your next appointment:   In Person  Provider:   Lennie Odor, MD    Other Instructions   Your cardiac CT will be scheduled at one of the below locations:   [facility] [address] [place], [place] [address] [phone]  If scheduled at [facility], please arrive at the [facility] main entrance (entrance A) of [facility] 30 minutes prior to test start time. You can use the FREE valet parking offered at the main entrance (encouraged to control the heart rate for the test) Proceed to the  [facility] Radiology Department (first floor) to check-in and test prep.  Please follow these instructions carefully (unless otherwise directed):  On the Night Before the Test: Be sure to Drink plenty of water. Do not consume any caffeinated/decaffeinated beverages or chocolate 12 hours prior to your test. Do not take any antihistamines 12 hours prior to your test. If the patient has contrast allergy: Patient will need a prescription for Prednisone and very clear instructions (as follows): Prednisone 50 mg - take 13 hours prior to test Take another Prednisone 50 mg 7 hours prior to test Take another Prednisone 50 mg 1 hour prior to test Take Benadryl 50 mg 1 hour prior to test Patient must complete all four doses of above prophylactic medications. Patient will need a ride after test due to Benadryl.  On the Day of the Test: Drink plenty of water until 1 hour prior to the test. Do not eat any food 4 hours prior to the test. You may take your regular medications prior to the test.  Take metoprolol (Lopressor) two hours prior to test. HOLD Furosemide/Hydrochlorothiazide morning of the test. FEMALES- please wear underwire-free bra if available, avoid dresses & tight clothing      After the Test: Drink plenty of water. After receiving IV contrast, you may experience a mild flushed feeling. This is normal. On occasion, you may experience a mild rash up to 24 hours after the test. This is not dangerous. If this occurs, you can take Benadryl 25 mg and increase your fluid intake. If you experience trouble breathing, this can be serious. If it is severe call 911 IMMEDIATELY. If it is mild, please call our office. If you take any of these medications: Glipizide/Metformin, Avandament, Glucavance, please do not take 48 hours after completing test unless otherwise instructed.  Please allow 2-4 weeks for scheduling of routine cardiac CTs. Some insurance companies require a pre-authorization which  may delay scheduling of this test.   For non-scheduling related questions, please contact the cardiac imaging nurse navigator should you have any questions/concerns: Rockwell Alexandria, Cardiac Imaging Nurse Navigator Larey Brick, Cardiac Imaging Nurse Navigator [facility] Direct Office Dial: [phone]   For scheduling needs, including cancellations and rescheduling, please call [place], [phone].      Signed, Lenna Gilford. Flora Lipps, MD, [facility]   [facility]  [address], [address] [place], [place] [address] [phone]  08/03/2021 10:56 AM

## 2021-08-03 ENCOUNTER — Other Ambulatory Visit: Payer: Self-pay | Admitting: Cardiovascular Disease

## 2021-08-03 ENCOUNTER — Other Ambulatory Visit: Payer: Self-pay

## 2021-08-03 ENCOUNTER — Ambulatory Visit: Payer: Medicare Other | Admitting: Cardiovascular Disease

## 2021-08-03 ENCOUNTER — Encounter: Payer: Self-pay | Admitting: Cardiovascular Disease

## 2021-08-03 VITALS — BP 96/68 | HR 89 | Resp 20 | Ht 63.0 in | Wt 116.0 lb

## 2021-08-03 DIAGNOSIS — R072 Precordial pain: Secondary | ICD-10-CM

## 2021-08-03 DIAGNOSIS — R002 Palpitations: Secondary | ICD-10-CM

## 2021-08-03 DIAGNOSIS — I10 Essential (primary) hypertension: Secondary | ICD-10-CM

## 2021-08-03 LAB — BASIC METABOLIC PANEL
BUN/Creatinine Ratio: 18 (ref 9–23)
BUN: 12 mg/dL (ref 6–24)
CO2: 24 mmol/L (ref 20–29)
Calcium: 9.6 mg/dL (ref 8.7–10.2)
Chloride: 103 mmol/L (ref 96–106)
Creatinine, Ser: 0.65 mg/dL (ref 0.57–1.00)
Glucose: 105 mg/dL — ABNORMAL HIGH (ref 70–99)
Potassium: 4.4 mmol/L (ref 3.5–5.2)
Sodium: 142 mmol/L (ref 134–144)
eGFR: 103 mL/min/{1.73_m2} (ref >59)

## 2021-08-03 MED ORDER — METOPROLOL TARTRATE 50 MG PO TABS: ORAL_TABLET | ORAL | 0 refills | Status: DC

## 2021-08-03 MED ORDER — PREDNISONE 50 MG PO TABS: ORAL_TABLET | ORAL | 0 refills | Status: DC

## 2021-08-03 MED ORDER — LISINOPRIL 2.5 MG PO TABS: 2.5000 mg | ORAL_TABLET | Freq: Every day | ORAL | 1 refills | Status: DC

## 2021-08-03 NOTE — Patient Instructions (Addendum)
Medication Instructions:  Decrease Lisinopril to 2.5 mg daily   Take Metoprolol 100 mg two hours before CT when scheduled.   STOP Metoprolol Succinate   *If you need a refill on your cardiac medications before your next appointment, please call your pharmacy*   Lab Work: BMET today  If you have labs (blood work) drawn today and your tests are completely normal, you will receive your results only by: MyChart Message (if you have MyChart) OR A paper copy in the mail If you have any lab test that is abnormal or we need to change your treatment, we will call you to review the results.   Testing/Procedures: Echocardiogram - Your physician has requested that you have an echocardiogram. Echocardiography is a painless test that uses sound waves to create images of your heart. It provides your doctor with information about the size and shape of your heart and how well your hearts chambers and valves are working. This procedure takes approximately one hour. There are no restrictions for this procedure. This will be performed at either our [facility] location - [address]  -or- [facility] location [facility] 2nd floor.  Your physician has requested that you have cardiac CT. Cardiac computed tomography (CT) is a painless test that uses an x-ray machine to take clear, detailed pictures of your heart. For further information please visit [email or web address] Please follow instruction sheet as given.    Follow-Up: At [facility], you and your health needs are our priority.  As part of our continuing mission to provide you with exceptional heart care, we have created designated Provider Care Teams.  These Care Teams include your primary Cardiologist (physician) and Advanced Practice Providers (APPs -  Physician Assistants and Nurse Practitioners) who all work together to provide you with the care you need, when you need it.  We recommend signing up for the patient portal  called "MyChart".  Sign up information is provided on this After Visit Summary.  MyChart is used to connect with patients for Virtual Visits (Telemedicine).  Patients are able to view lab/test results, encounter notes, upcoming appointments, etc.  Non-urgent messages can be sent to your provider as well.   To learn more about what you can do with MyChart, go to [email or web address].    Your next appointment:   As needed  The format for your next appointment:   In Person  Provider:   Lennie Odor, MD    Other Instructions   Your cardiac CT will be scheduled at one of the below locations:   [facility] [address] [place], [place] [address] [phone]  If scheduled at [facility], please arrive at the [facility] main entrance (entrance A) of [facility] 30 minutes prior to test start time. You can use the FREE valet parking offered at the main entrance (encouraged to control the heart rate for the test) Proceed to the [facility] Radiology Department (first floor) to check-in and test prep.  Please follow these instructions carefully (unless otherwise directed):  On the Night Before the Test: Be sure to Drink plenty of water. Do not consume any caffeinated/decaffeinated beverages or chocolate 12 hours prior to your test. Do not take any antihistamines 12 hours prior to your test. If the patient has contrast allergy: Patient will need a prescription for Prednisone and very clear instructions (as follows): Prednisone 50 mg - take 13 hours prior to test Take another Prednisone 50 mg 7 hours  prior to test Take another Prednisone 50 mg 1 hour prior to test Take Benadryl 50 mg 1 hour prior to test Patient must complete all four doses of above prophylactic medications. Patient will need a ride after test due to Benadryl.  On the Day of the Test: Drink plenty of water until 1 hour prior to the test. Do not eat any food 4 hours prior to the  test. You may take your regular medications prior to the test.  Take metoprolol (Lopressor) two hours prior to test. HOLD Furosemide/Hydrochlorothiazide morning of the test. FEMALES- please wear underwire-free bra if available, avoid dresses & tight clothing      After the Test: Drink plenty of water. After receiving IV contrast, you may experience a mild flushed feeling. This is normal. On occasion, you may experience a mild rash up to 24 hours after the test. This is not dangerous. If this occurs, you can take Benadryl 25 mg and increase your fluid intake. If you experience trouble breathing, this can be serious. If it is severe call 911 IMMEDIATELY. If it is mild, please call our office. If you take any of these medications: Glipizide/Metformin, Avandament, Glucavance, please do not take 48 hours after completing test unless otherwise instructed.  Please allow 2-4 weeks for scheduling of routine cardiac CTs. Some insurance companies require a pre-authorization which may delay scheduling of this test.   For non-scheduling related questions, please contact the cardiac imaging nurse navigator should you have any questions/concerns: Rockwell Alexandria, Cardiac Imaging Nurse Navigator Larey Brick, Cardiac Imaging Nurse Navigator [facility] Direct Office Dial: [phone]   For scheduling needs, including cancellations and rescheduling, please call [place], [phone].

## 2021-08-13 ENCOUNTER — Ambulatory Visit (HOSPITAL_COMMUNITY): Payer: Medicare Other | Attending: Cardiovascular Disease

## 2021-08-13 ENCOUNTER — Other Ambulatory Visit: Payer: Self-pay

## 2021-08-13 DIAGNOSIS — R072 Precordial pain: Secondary | ICD-10-CM | POA: Diagnosis present

## 2021-08-13 LAB — ECHOCARDIOGRAM COMPLETE
Area-P 1/2: 2.95 cm2
S' Lateral: 2.4 cm

## 2021-08-15 ENCOUNTER — Telehealth (HOSPITAL_COMMUNITY): Payer: Self-pay | Admitting: Emergency Medicine

## 2021-08-15 NOTE — Telephone Encounter (Signed)
Reaching out to patient to offer assistance regarding upcoming cardiac imaging study; pt verbalizes understanding of appt date/time, parking situation and where to check in, pre-test NPO status and medications ordered, and verified current allergies; name and call back number provided for further questions should they arise Shannon Bond RN Navigator Cardiac Imaging [facility] [phone] office [phone] cell  Difficult IV  13 hr prep reviewed (830p, 230a, 830a) 100mg  metoprolol tart Arrival 9a

## 2021-08-16 ENCOUNTER — Other Ambulatory Visit: Payer: Self-pay

## 2021-08-16 ENCOUNTER — Ambulatory Visit (HOSPITAL_COMMUNITY)
Admission: RE | Admit: 2021-08-16 | Discharge: 2021-08-16 | Disposition: A | Discharge status: Home / Self Care | Payer: Medicare Other | Source: Ambulatory Visit | Attending: Cardiovascular Disease | Admitting: Cardiovascular Disease

## 2021-08-16 ENCOUNTER — Other Ambulatory Visit (HOSPITAL_COMMUNITY): Payer: Self-pay | Admitting: Cardiovascular Disease

## 2021-08-16 DIAGNOSIS — Z789 Other specified health status: Secondary | ICD-10-CM | POA: Insufficient documentation

## 2021-08-16 DIAGNOSIS — R072 Precordial pain: Secondary | ICD-10-CM | POA: Insufficient documentation

## 2021-08-16 MED ORDER — NITROGLYCERIN 0.4 MG SL SUBL: 0.8000 mg | SUBLINGUAL_TABLET | Freq: Once | SUBLINGUAL | Status: AC

## 2021-08-16 MED ORDER — IOHEXOL 350 MG/ML SOLN
95.0000 mL | Freq: Once | INTRAVENOUS | Status: AC | PRN
  Administered 2021-08-16: 95 mL via INTRAVENOUS

## 2021-08-16 MED ORDER — NITROGLYCERIN 0.4 MG SL SUBL
SUBLINGUAL_TABLET | SUBLINGUAL | Status: AC
  Administered 2021-08-16: 0.8 mg via SUBLINGUAL
  Filled 2021-08-16: qty 2

## 2021-09-20 ENCOUNTER — Other Ambulatory Visit: Payer: Self-pay | Admitting: Physical Medicine & Rehabilitation

## 2021-09-20 ENCOUNTER — Other Ambulatory Visit: Payer: Self-pay | Admitting: Registered Nurse

## 2021-09-20 DIAGNOSIS — M797 Fibromyalgia: Secondary | ICD-10-CM

## 2021-09-20 DIAGNOSIS — G90512 Complex regional pain syndrome I of left upper limb: Secondary | ICD-10-CM

## 2021-09-20 DIAGNOSIS — M47817 Spondylosis without myelopathy or radiculopathy, lumbosacral region: Secondary | ICD-10-CM

## 2021-09-20 DIAGNOSIS — G894 Chronic pain syndrome: Secondary | ICD-10-CM

## 2021-09-24 ENCOUNTER — Encounter: Payer: Medicare Other | Attending: Physical Medicine & Rehabilitation | Admitting: Registered Nurse

## 2021-09-24 ENCOUNTER — Encounter: Payer: Self-pay | Admitting: Registered Nurse

## 2021-09-24 ENCOUNTER — Other Ambulatory Visit: Payer: Self-pay

## 2021-09-24 VITALS — BP 133/89 | HR 90 | Ht 63.0 in | Wt 119.4 lb

## 2021-09-24 DIAGNOSIS — M542 Cervicalgia: Secondary | ICD-10-CM | POA: Insufficient documentation

## 2021-09-24 DIAGNOSIS — M546 Pain in thoracic spine: Secondary | ICD-10-CM | POA: Diagnosis present

## 2021-09-24 DIAGNOSIS — M2550 Pain in unspecified joint: Secondary | ICD-10-CM | POA: Insufficient documentation

## 2021-09-24 DIAGNOSIS — M25511 Pain in right shoulder: Secondary | ICD-10-CM | POA: Diagnosis present

## 2021-09-24 DIAGNOSIS — M25512 Pain in left shoulder: Secondary | ICD-10-CM | POA: Insufficient documentation

## 2021-09-24 DIAGNOSIS — G609 Hereditary and idiopathic neuropathy, unspecified: Secondary | ICD-10-CM | POA: Insufficient documentation

## 2021-09-24 DIAGNOSIS — Z79891 Long term (current) use of opiate analgesic: Secondary | ICD-10-CM | POA: Insufficient documentation

## 2021-09-24 DIAGNOSIS — G894 Chronic pain syndrome: Secondary | ICD-10-CM | POA: Diagnosis present

## 2021-09-24 DIAGNOSIS — Z5181 Encounter for therapeutic drug level monitoring: Secondary | ICD-10-CM | POA: Insufficient documentation

## 2021-09-24 DIAGNOSIS — M47817 Spondylosis without myelopathy or radiculopathy, lumbosacral region: Secondary | ICD-10-CM | POA: Insufficient documentation

## 2021-09-24 DIAGNOSIS — G8929 Other chronic pain: Secondary | ICD-10-CM | POA: Diagnosis present

## 2021-09-24 DIAGNOSIS — M7061 Trochanteric bursitis, right hip: Secondary | ICD-10-CM | POA: Diagnosis present

## 2021-09-24 DIAGNOSIS — M7062 Trochanteric bursitis, left hip: Secondary | ICD-10-CM | POA: Insufficient documentation

## 2021-09-24 MED ORDER — HYDROCODONE-ACETAMINOPHEN 7.5-325 MG PO TABS: 1.0000 | ORAL_TABLET | Freq: Four times a day (QID) | ORAL | 0 refills | Status: DC | PRN

## 2021-09-24 NOTE — Progress Notes (Signed)
Subjective:    Patient ID: Shannon French, female    DOB: 22-Sep-1965, 56 y.o.   MRN: [id number]  HPI: Shannon French is a 56 y.o. female who returns for follow up appointment for chronic pain and medication refill. She states her pain is located in her neck, bilateral shoulders, mid- lower back pain and bilateral hip pain. She also reports generalized joint pain and states she is having a Fibro Flare. She rates her pain 8. Her current exercise regime is walking and performing stretching exercises.  [name] Morphine equivalent is 30.00 MME.   UDS ordered today.     Pain Inventory Average Pain 7 Pain Right Now 8 My pain is constant, sharp, burning, dull, stabbing, tingling, and aching  In the last 24 hours, has pain interfered with the following? General activity 9 Relation with others 9 Enjoyment of life 9 What TIME of day is your pain at its worst? morning , daytime, evening, and night Sleep (in general) Fair  Pain is worse with: walking, bending, sitting, inactivity, standing, and some activites Pain improves with: rest, heat/ice, therapy/exercise, pacing activities, and medication Relief from Meds: 4  Family History  Problem Relation Age of Onset   Heart attack Mother    Cancer Father    Heart attack Brother    Social History   Socioeconomic History   Marital status: Significant Other    Spouse name: Not on file   Number of children: 1   Years of education: Not on file   Highest education level: Not on file  Occupational History   Occupation: Disabled  Tobacco Use   Smoking status: Former    Years: 10.00    Types: Cigarettes    Quit date: 10/14/2009    Years since quitting: 11.9   Smokeless tobacco: Never  Vaping Use   Vaping Use: Never used  Substance and Sexual Activity   Alcohol use: No   Drug use: No   Sexual activity: Not on file  Other Topics Concern   Not on file  Social History Narrative   Not on file   Social Determinants of Health   Financial  Resource Strain: Not on file  Food Insecurity: Not on file  Transportation Needs: Not on file  Physical Activity: Not on file  Stress: Not on file  Social Connections: Not on file   Past Surgical History:  Procedure Laterality Date   ABDOMINAL HYSTERECTOMY     ABDOMINAL SURGERY     APPENDECTOMY     CHOLECYSTECTOMY     CYST EXCISION     OVARY SURGERY     Past Surgical History:  Procedure Laterality Date   ABDOMINAL HYSTERECTOMY     ABDOMINAL SURGERY     APPENDECTOMY     CHOLECYSTECTOMY     CYST EXCISION     OVARY SURGERY     Past Medical History:  Diagnosis Date   Arm pain    left   Degenerative disc disease    Diabetes mellitus    Fibromyalgia    Hyperlipidemia    Hypertension    IBS (irritable bowel syndrome)    Osteoporosis    BP 133/89    Pulse 90    Ht 5\' 3"  (1.6 m)    Wt 119 lb 6.4 oz (54.2 kg)    SpO2 98%    BMI 21.15 kg/m   Opioid Risk Score:   Fall Risk Score:  `1  Depression screen PHQ 2/9  Depression screen [facility] 2/9 09/24/2021  07/23/2021 05/23/2021 11/16/2020 09/19/2020 02/02/2020 06/01/2019  Decreased Interest 0 0 0 0 0 0 0  Down, Depressed, Hopeless 0 0 0 0 0 0 -  PHQ - 2 Score 0 0 0 0 0 0 0  Altered sleeping - - - - - - -  Tired, decreased energy - - - - - - -  Change in appetite - - - - - - -  Feeling bad or failure about yourself  - - - - - - -  Trouble concentrating - - - - - - -  Moving slowly or fidgety/restless - - - - - - -  Suicidal thoughts - - - - - - -  PHQ-9 Score - - - - - - -     Review of Systems  Constitutional: Negative.   HENT: Negative.    Eyes: Negative.   Respiratory: Negative.    Cardiovascular: Negative.   Gastrointestinal: Negative.   Endocrine: Negative.   Genitourinary: Negative.   Musculoskeletal:  Positive for back pain.  Skin: Negative.   Allergic/Immunologic: Negative.   Neurological: Negative.   Hematological: Negative.   Psychiatric/Behavioral: Negative.        Objective:   Physical Exam Vitals and  nursing note reviewed.  Constitutional:      Appearance: Normal appearance.  Neck:     Comments: Cervical Paraspinal Tenderness: C-5-C-6  Cardiovascular:     Rate and Rhythm: Normal rate and regular rhythm.     Pulses: Normal pulses.     Heart sounds: Normal heart sounds.  Pulmonary:     Effort: Pulmonary effort is normal.     Breath sounds: Normal breath sounds.  Musculoskeletal:     Cervical back: Normal range of motion and neck supple.     Comments: Normal Muscle Bulk and Muscle Testing Reveals:  Upper Extremities: Full ROM and Muscle Strength5/5 Bilateral AC Joint Tenderness  Thoracic and Lumbar Hypersensitivity Bilateral Greater Trochanter Tenderness Lower Extremities: Full ROM and Muscle Strength 5/5 Arises from Table with ease Narrow Based  Gait     Skin:    General: Skin is warm and dry.  Neurological:     Mental Status: She is alert and oriented to person, place, and time.  Psychiatric:        Mood and Affect: Mood normal.        Behavior: Behavior normal.         Assessment & Plan:  1. Bilateral sacroiliac joint dysfunction: 09/24/2021. Refilled: HYDROcodone 7.5/325mg  one tablet every 6 hrs as needed # 120. Second script sent for the following month. We will continue the opioid monitoring program, this consists of regular clinic visits, examinations, urine drug screen, pill counts as well as use of [place] Controlled Substance Reporting system. A 12 month History has been reviewed on the [place] Controlled Substance Reporting System on 09/24/2021.  2. Chronic pain syndrome consistent with fibromyalgia. Continue with exercise regime, heat therapy,voltaren gel, lidocaine patches and topamax. Continue current analgesics . 09/24/2021 3. Lumbosacral Spondylosis/ Bilateral lumbar radiculopathy:Continue Elavil and Cymbalta. 09/24/2021,  4. Complex Regional Pain Syndrome: Continue Cymbalta. .Dr. Yolanda Bonine Following. 09/24/2021 5. Type 2 diabetes.: PCP  Following. 09/24/2021 6. Muscle Spasms: Continue Tizanidine 4 mg one tablet every 8 hours as needed for muscle spasms. 02/120/2023 7. Insomnia: Continue Elavil. Continue to monitor. 09/24/2021 8.Cervical Radiculopathy:Continue Elavil: Continue to monitor. 09/24/2021 9. Cervicalgia/ Cervical Inflammation: Continue current medication regime. Alternate with heat and Ice Therapy. 09/24/2021 10. Bilateral Thoracic Pain:Continue HEP as Tolerated.  Continue  current medication regime. Continue to Monitor. 07/23/2021 11. Bilateral  Shoulder Pain: Continue with HEP as Tolerated. Continue to Alternate Ice and Heat. Continue to monitor. Ortho Following. 02/20/20223   12. Bilateral Hand and Bilateral Feet Neuropathic Pain:  Continue current medication regimen. Continue HEP as tolerated. Continue to monitor. 09/24/2021 13. Polyarthralgia: Continue HEP as Tolerated. Continue to alternate Ice and Heat Therapy. Continue to monitor. 09/24/2021   F/U in 2 months

## 2021-09-29 LAB — TOXASSURE SELECT,+ANTIDEPR,UR

## 2021-10-03 ENCOUNTER — Telehealth: Payer: Self-pay | Admitting: *Deleted

## 2021-10-03 NOTE — Telephone Encounter (Signed)
Urine drug screen for this encounter is consistent for prescribed medication 

## 2021-11-22 ENCOUNTER — Encounter: Payer: Medicare Other | Attending: Physical Medicine & Rehabilitation | Admitting: Registered Nurse

## 2021-11-22 ENCOUNTER — Encounter: Payer: Self-pay | Admitting: Registered Nurse

## 2021-11-22 VITALS — BP 148/93 | HR 80 | Ht 63.0 in | Wt 115.0 lb

## 2021-11-22 DIAGNOSIS — M546 Pain in thoracic spine: Secondary | ICD-10-CM | POA: Diagnosis present

## 2021-11-22 DIAGNOSIS — Z79891 Long term (current) use of opiate analgesic: Secondary | ICD-10-CM | POA: Insufficient documentation

## 2021-11-22 DIAGNOSIS — M2550 Pain in unspecified joint: Secondary | ICD-10-CM | POA: Insufficient documentation

## 2021-11-22 DIAGNOSIS — Z5181 Encounter for therapeutic drug level monitoring: Secondary | ICD-10-CM | POA: Insufficient documentation

## 2021-11-22 DIAGNOSIS — M542 Cervicalgia: Secondary | ICD-10-CM | POA: Insufficient documentation

## 2021-11-22 DIAGNOSIS — G609 Hereditary and idiopathic neuropathy, unspecified: Secondary | ICD-10-CM | POA: Insufficient documentation

## 2021-11-22 DIAGNOSIS — G8929 Other chronic pain: Secondary | ICD-10-CM | POA: Diagnosis present

## 2021-11-22 DIAGNOSIS — M47817 Spondylosis without myelopathy or radiculopathy, lumbosacral region: Secondary | ICD-10-CM | POA: Diagnosis present

## 2021-11-22 DIAGNOSIS — M25512 Pain in left shoulder: Secondary | ICD-10-CM | POA: Diagnosis present

## 2021-11-22 DIAGNOSIS — G894 Chronic pain syndrome: Secondary | ICD-10-CM | POA: Insufficient documentation

## 2021-11-22 DIAGNOSIS — M25511 Pain in right shoulder: Secondary | ICD-10-CM | POA: Diagnosis present

## 2021-11-22 MED ORDER — HYDROCODONE-ACETAMINOPHEN 7.5-325 MG PO TABS: 1.0000 | ORAL_TABLET | Freq: Four times a day (QID) | ORAL | 0 refills | Status: DC | PRN

## 2021-11-22 NOTE — Progress Notes (Signed)
? ?Subjective:  ? ? Patient ID: Shannon French, female    DOB: 1966-05-19, 56 y.o.   MRN: [id number] ? ?HPI: Shannon French is a 56 y.o. female who returns for follow up appointment for chronic pain and medication refill. She states her  pain is located in her neck, bilateral shoulders and mid- lower back. Also reports left hip pain and bilateral hand and bilateral feet with tingling and burning. She rates her pain 5. Her current exercise regime is walking and performing stretching exercises. ? ?Ms. Boylan Morphine equivalent is 30.00  MME.    ? ? ? ?Pain Inventory ?Average Pain 5 ?Pain Right Now 5 ?My pain is constant, sharp, burning, dull, stabbing, tingling, and aching ? ?In the last 24 hours, has pain interfered with the following? ?General activity 4 ?Relation with others 4 ?Enjoyment of life 3 ?What TIME of day is your pain at its worst? morning , daytime, evening, and night ?Sleep (in general) Fair ? ?Pain is worse with: walking, bending, sitting, inactivity, standing, and some activites ?Pain improves with: rest, heat/ice, therapy/exercise, pacing activities, and medication ?Relief from Meds: 5 ? ?Family History  ?Problem Relation Age of Onset  ? Heart attack Mother   ? Cancer Father   ? Heart attack Brother   ? ?Social History  ? ?Socioeconomic History  ? Marital status: Significant Other  ?  Spouse name: Not on file  ? Number of children: 1  ? Years of education: Not on file  ? Highest education level: Not on file  ?Occupational History  ? Occupation: Disabled  ?Tobacco Use  ? Smoking status: Former  ?  Years: 10.00  ?  Types: Cigarettes  ?  Quit date: 10/14/2009  ?  Years since quitting: 12.1  ? Smokeless tobacco: Never  ?Vaping Use  ? Vaping Use: Never used  ?Substance and Sexual Activity  ? Alcohol use: No  ? Drug use: No  ? Sexual activity: Not on file  ?Other Topics Concern  ? Not on file  ?Social History Narrative  ? Not on file  ? ?Social Determinants of Health  ? ?Financial Resource Strain: Not on file   ?Food Insecurity: Not on file  ?Transportation Needs: Not on file  ?Physical Activity: Not on file  ?Stress: Not on file  ?Social Connections: Not on file  ? ?Past Surgical History:  ?Procedure Laterality Date  ? ABDOMINAL HYSTERECTOMY    ? ABDOMINAL SURGERY    ? APPENDECTOMY    ? CHOLECYSTECTOMY    ? CYST EXCISION    ? OVARY SURGERY    ? ?Past Surgical History:  ?Procedure Laterality Date  ? ABDOMINAL HYSTERECTOMY    ? ABDOMINAL SURGERY    ? APPENDECTOMY    ? CHOLECYSTECTOMY    ? CYST EXCISION    ? OVARY SURGERY    ? ?Past Medical History:  ?Diagnosis Date  ? Arm pain   ? left  ? Degenerative disc disease   ? Diabetes mellitus   ? Fibromyalgia   ? Hyperlipidemia   ? Hypertension   ? IBS (irritable bowel syndrome)   ? Osteoporosis   ? ?BP (!) 138/102   Pulse 79   Ht 5\' 3"  (1.6 m)   Wt 115 lb (52.2 kg)   SpO2 99%   BMI 20.37 kg/m?  ? ?Opioid Risk Score:   ?Fall Risk Score:  `1 ? ?Depression screen PHQ 2/9 ? ? ?  09/24/2021  ?  9:43 AM 07/23/2021  ? 10:24 AM 05/23/2021  ?  10:21 AM 11/16/2020  ? 10:08 AM 09/19/2020  ? 10:02 AM 02/02/2020  ?  9:32 AM 06/01/2019  ?  8:58 AM  ?Depression screen PHQ 2/9  ?Decreased Interest 0 0 0 0 0 0 0  ?Down, Depressed, Hopeless 0 0 0 0 0 0   ?PHQ - 2 Score 0 0 0 0 0 0 0  ?  ? ?Review of Systems  ?Musculoskeletal:  Positive for back pain and myalgias.  ?     Bilateral shoulder, arm, foot pain ?Whole left leg pain ?Right lower leg pain in the front ?Bilateral lower leg pain in the back  ?All other systems reviewed and are negative. ? ?   ?Objective:  ? Physical Exam ? ? ? ? ?   ?Assessment & Plan:  ?1. Bilateral sacroiliac joint dysfunction: 11/22/2021. ?Refilled: HYDROcodone 7.5/325mg  one tablet every 6 hrs as needed # 120.  ?We will continue the opioid monitoring program, this consists of regular clinic visits, examinations, urine drug screen, pill counts as well as use of [place] Controlled Substance Reporting system. A 12 month History has been reviewed on the [place]  Controlled Substance Reporting System on 11/22/2021.  ?2. Chronic pain syndrome consistent with fibromyalgia. Continue with exercise regime, heat therapy,voltaren gel, lidocaine patches and topamax. Continue current analgesics . 11/22/2021 ?3. Lumbosacral Spondylosis/ Bilateral lumbar radiculopathy:Continue Elavil and Cymbalta. 11/22/2021,  ?4. Complex Regional Pain Syndrome: Continue Cymbalta. .Dr. Yolanda Bonine Following. 11/22/2021 ?5. Type 2 diabetes.: PCP Following. 11/22/2021 ?6. Muscle Spasms: Continue Tizanidine 4 mg one tablet every 8 hours as needed for muscle spasms. 11/22/2021 ?7. Insomnia: Continue Elavil. Continue to monitor. 11/22/2021 ?8.Cervical Radiculopathy:Continue Elavil: Continue to monitor. 11/22/2021 ?9. Cervicalgia/ Cervical Inflammation: Continue current medication regime. Alternate with heat and Ice Therapy. 11/22/2021 ?10. Bilateral Thoracic Pain:Continue HEP as Tolerated.  Continue current medication regime. Continue to Monitor. 11/22/2021 ?11. Bilateral  Shoulder Pain: Continue with HEP as Tolerated. Continue to Alternate Ice and Heat. Continue to monitor. Ortho Following. 04/20/20223  ? 12. Bilateral Hand and Bilateral Feet Neuropathic Pain:  Continue current medication regimen. Continue HEP as tolerated. Continue to monitor. 11/22/2021 ?13. Polyarthralgia: Continue HEP as Tolerated. Continue to alternate Ice and Heat Therapy. Continue to monitor. 11/22/2021 ?  ?F/U in 2 months   ?  ?  ? ? ? ? ? ? ? ? ? ?

## 2021-11-29 ENCOUNTER — Telehealth: Payer: Self-pay

## 2021-11-29 MED ORDER — HYDROCODONE-ACETAMINOPHEN 10-325 MG PO TABS: 1.0000 | ORAL_TABLET | Freq: Three times a day (TID) | ORAL | 0 refills | Status: DC | PRN

## 2021-11-29 NOTE — Telephone Encounter (Addendum)
Patient called and stated the pharmacy is telling her they are out of Hydrocodone 5-325 and 7.5-325 mg. She stated they only have the 10-325 mg. Previous prescription cancelled at [facility] in [place]. Confirmed with pharmacist that 10-325 mg are in stock ? ?

## 2021-12-19 ENCOUNTER — Other Ambulatory Visit: Payer: Self-pay | Admitting: Registered Nurse

## 2021-12-23 NOTE — Progress Notes (Unsigned)
Cardiology Office Note:   Date:  12/25/2021  NAME:  Shannon French    MRN: [id number] DOB:  [birth year]   PCP:  Nonnie Done., MD  Cardiologist:  None  Electrophysiologist:  None   Referring MD: Nonnie Done., MD   Chief Complaint  Patient presents with   Follow-up         History of Present Illness:   Shannon French is a 56 y.o. female with a hx of DM, HTN, HLD who presents for follow-up. Seen in December for CP. Minimal CAD on CCTA.  Denies any further episodes of chest pain or trouble breathing.  Symptoms were attributed to GI issues.  She is waiting to see a gastroenterologist.  She has had issues with statin medications.  We discussed Zetia.  She will forward me the results from her recent cholesterol check.  Denies any major symptoms in office.  Apparently she had cramping with the statin medications.  Most recent A1c 6.8.  Blood pressure 130/80.  She is on lisinopril.  Cardiovascular examination normal.  Results discussed in office today.  Overall goal is prevention.  Problem List DM -A1c 6.8 2. HTN 3. HLD 4. RA  Past Medical History: Past Medical History:  Diagnosis Date   Arm pain    left   Degenerative disc disease    Diabetes mellitus    Fibromyalgia    Hyperlipidemia    Hypertension    IBS (irritable bowel syndrome)    Osteoporosis     Past Surgical History: Past Surgical History:  Procedure Laterality Date   ABDOMINAL HYSTERECTOMY     ABDOMINAL SURGERY     APPENDECTOMY     CHOLECYSTECTOMY     CYST EXCISION     OVARY SURGERY      Current Medications: Current Meds  Medication Sig   amitriptyline (ELAVIL) 50 MG tablet TAKE 1 TABLET BY MOUTH EVERYDAY AT BEDTIME   Apremilast (OTEZLA) 30 MG TABS Take 0.25 mg by mouth 2 (two) times daily.   carbamazepine (TEGRETOL) 200 MG tablet TAKE 1 TABLET BY MOUTH TWICE A DAY   clobetasol ointment (TEMOVATE) 0.05 % Apply 1 application topically 2 (two) times daily.   clobetasol ointment (TEMOVATE) 0.05 %     DHEA 25 MG CAPS Take 25 mg by mouth daily.   diclofenac (VOLTAREN) 75 MG EC tablet TAKE 1 TABLET BY MOUTH TWICE A DAY (Patient taking differently: 75 mg 1 day or 1 dose. One tablet daily)   diclofenac sodium (VOLTAREN) 1 % GEL Apply 2 g topically 4 (four) times daily.   DULoxetine (CYMBALTA) 30 MG capsule TAKE 1 CAPSULE BY MOUTH EVERY DAY   DULoxetine (CYMBALTA) 60 MG capsule Take 1 capsule (60 mg total) by mouth daily.   ezetimibe (ZETIA) 10 MG tablet Take 1 tablet (10 mg total) by mouth daily.   folic acid (FOLVITE) 1 MG tablet Take 1 mg by mouth daily.   HYDROcodone-acetaminophen (NORCO) 10-325 MG tablet Take 1 tablet by mouth every 8 (eight) hours as needed.   insulin lispro (HUMALOG) 100 UNIT/ML injection Inject into the skin as needed.   LANTUS SOLOSTAR 100 UNIT/ML Solostar Pen Inject 20 Units into the skin every morning.   lidocaine (LIDODERM) 5 % Place 1 patch onto the skin daily. Remove & Discard patch within 12 hours or as directed by MD   lisinopril (ZESTRIL) 2.5 MG tablet Take 1 tablet (2.5 mg total) by mouth daily.   ONE TOUCH ULTRA TEST test strip  pantoprazole (PROTONIX) 40 MG tablet Take 40 mg by mouth daily.    PROAIR HFA 108 (90 BASE) MCG/ACT inhaler Inhale 2 puffs into the lungs 4 (four) times daily as needed.   promethazine (PHENERGAN) 25 MG tablet Take 25 mg by mouth every 6 (six) hours as needed.   risedronate (ACTONEL) 150 MG tablet Take 150 mg by mouth daily. with water on empty stomach, nothing by mouth or lie down for next 30 minutes.   Semaglutide,0.25 or 0.5MG /DOS, (OZEMPIC, 0.25 OR 0.5 MG/DOSE,) 2 MG/1.5ML SOPN Inject 0.25 mg into the skin. [place] weekly   tiZANidine (ZANAFLEX) 4 MG tablet TAKE 1 TABLET (4 MG TOTAL) BY MOUTH EVERY 8 (EIGHT) HOURS AS NEEDED FOR MUSCLE SPASMS   valACYclovir (VALTREX) 1000 MG tablet    [DISCONTINUED] aspirin EC 81 MG tablet Take 81 mg by mouth daily.     Allergies:    Gabapentin [gabapentin], Actos [pioglitazone hydrochloride],  Esomeprazole magnesium, Glucophage [metformin hydrochloride], Iodinated contrast media, Mepivacaine hcl, Other, and Sulfa antibiotics   Social History: Social History   Socioeconomic History   Marital status: Significant Other    Spouse name: Not on file   Number of children: 1   Years of education: Not on file   Highest education level: Not on file  Occupational History   Occupation: Disabled  Tobacco Use   Smoking status: Former    Years: 10.00    Types: Cigarettes    Quit date: 10/14/2009    Years since quitting: 12.2   Smokeless tobacco: Never  Vaping Use   Vaping Use: Never used  Substance and Sexual Activity   Alcohol use: No   Drug use: No   Sexual activity: Not on file  Other Topics Concern   Not on file  Social History Narrative   Not on file   Social Determinants of Health   Financial Resource Strain: Not on file  Food Insecurity: Not on file  Transportation Needs: Not on file  Physical Activity: Not on file  Stress: Not on file  Social Connections: Not on file     Family History: The patient's family history includes Cancer in her father; Heart attack in her brother and mother.  ROS:   All other ROS reviewed and negative. Pertinent positives noted in the HPI.     EKGs/Labs/Other Studies Reviewed:   The following studies were personally reviewed by me today:  TTE 08/13/2021  1. Left ventricular ejection fraction, by estimation, is 55 to 60%. The  left ventricle has normal function. The left ventricle has no regional  wall motion abnormalities. There is mild asymmetric left ventricular  hypertrophy of the basal-septal segment.  Left ventricular diastolic parameters are consistent with Grade I  diastolic dysfunction (impaired relaxation).   2. Right ventricular systolic function is normal. The right ventricular  size is normal. Mildly increased right ventricular wall thickness. There  is normal pulmonary artery systolic pressure.   3. The mitral valve  is normal in structure. No evidence of mitral valve  regurgitation.   4. The aortic valve is tricuspid. Aortic valve regurgitation is not  visualized. No aortic stenosis is present.   5. The inferior vena cava is normal in size with greater than 50%  respiratory variability, suggesting right atrial pressure of 3 mmHg.   CCTA 08/16/2021 IMPRESSION: 1. Coronary calcium score of 0.   2. Normal coronary origin with right dominance.   3. Minimal non-calcified plaque (<25%) in the LAD/RCA.   RECOMMENDATIONS: 1. Minimal non-obstructive CAD (  0-24%). Consider non-atherosclerotic causes of chest pain. Consider preventive therapy and risk factor modification.    Recent Labs: 06/07/2021: ALT 56; Hemoglobin 14.9; Platelets 348 08/03/2021: BUN 12; Creatinine, Ser 0.65; Potassium 4.4; Sodium 142   Recent Lipid Panel No results found for: CHOL, TRIG, HDL, CHOLHDL, VLDL, LDLCALC, LDLDIRECT  Physical Exam:   VS:  BP 130/80   Pulse 83   Ht  (1.6 m)   Wt 116 lb 12.8 oz (53 kg)   SpO2 99%   BMI 20.69 kg/m    Wt Readings from Last 3 Encounters:  12/25/21 116 lb 12.8 oz (53 kg)  11/22/21 115 lb (52.2 kg)  09/24/21 119 lb 6.4 oz (54.2 kg)    General: Well nourished, well developed, in no acute distress Head: Atraumatic, normal size  Eyes: PEERLA, EOMI  Neck: Supple, no JVD Endocrine: No thryomegaly Cardiac: Normal S1, S2; RRR; no murmurs, rubs, or gallops Lungs: Clear to auscultation bilaterally, no wheezing, rhonchi or rales  Abd: Soft, nontender, no hepatomegaly  Ext: No edema, pulses 2+ Musculoskeletal: No deformities, BUE and BLE strength normal and equal Skin: Warm and dry, no rashes   Neuro: Alert and oriented to person, place, time, and situation, CNII-XII grossly intact, no focal deficits  Psych: Normal mood and affect   ASSESSMENT:   Shannon French is a 56 y.o. female who presents for the following: 1. Precordial pain   2. Mixed hyperlipidemia     PLAN:   1. Precordial  pain -Coronary CTA with minimal CAD.  Noncardiac chest pain.  Symptoms have improved.  No need for aspirin.  We will continue with aggressive lipid-lowering therapies.  Discussion below.  2. Mixed hyperlipidemia -She is diabetic.  Goal LDL less than 70.  She will forward [place] the results of her recent lab draw by her primary care physician.  Cannot tolerate statin therapies.  Would recommend she start Zetia 10 mg daily.  Goal LDL less than 70.  She will see [place] yearly.      Disposition: Return in about 1 year (around 12/26/2022).  Medication Adjustments/Labs and Tests Ordered: Current medicines are reviewed at length with the patient today.  Concerns regarding medicines are outlined above.  No orders of the defined types were placed in this encounter.  Meds ordered this encounter  Medications   ezetimibe (ZETIA) 10 MG tablet    Sig: Take 1 tablet (10 mg total) by mouth daily.    Dispense:  90 tablet    Refill:  3    Patient Instructions  Medication Instructions:  STOP Aspirin  START Zetia 10 mg daily   *If you need a refill on your cardiac medications before your next appointment, please call your pharmacy*  Follow-Up: At [facility], you and your health needs are our priority.  As part of our continuing mission to provide you with exceptional heart care, we have created designated Provider Care Teams.  These Care Teams include your primary Cardiologist (physician) and Advanced Practice Providers (APPs -  Physician Assistants and Nurse Practitioners) who all work together to provide you with the care you need, when you need it.  We recommend signing up for the patient portal called "MyChart".  Sign up information is provided on this After Visit Summary.  MyChart is used to connect with patients for Virtual Visits (Telemedicine).  Patients are able to view lab/test results, encounter notes, upcoming appointments, etc.  Non-urgent messages can be sent to your provider as well.   To  learn  more about what you can do with MyChart, go to [email or web address].    Your next appointment:   12 month(s)  The format for your next appointment:   In Person  Provider:   Lennie OdorWesley O'Neal, MD or Marjie Skiffallie Goodrich, PA-C, or Hao Meng,PA-C             Time Spent with Patient: I have spent a total of 25 minutes with patient reviewing hospital notes, telemetry, EKGs, labs and examining the patient as well as establishing an assessment and plan that was discussed with the patient.  > 50% of time was spent in direct patient care.  Signed, Lenna GilfordWesley T. Flora Lipps'Neal, MD, [facility] [place]  [facility]  [address], [address] [place], [place] [address] [phone]  12/25/2021 2:25 PM

## 2021-12-25 ENCOUNTER — Encounter: Payer: Self-pay | Admitting: Cardiovascular Disease

## 2021-12-25 ENCOUNTER — Ambulatory Visit: Payer: Medicare Other | Admitting: Cardiovascular Disease

## 2021-12-25 VITALS — BP 130/80 | HR 83 | Ht 63.0 in | Wt 116.8 lb

## 2021-12-25 DIAGNOSIS — E782 Mixed hyperlipidemia: Secondary | ICD-10-CM

## 2021-12-25 DIAGNOSIS — R072 Precordial pain: Secondary | ICD-10-CM

## 2021-12-25 MED ORDER — EZETIMIBE 10 MG PO TABS: 10.0000 mg | ORAL_TABLET | Freq: Every day | ORAL | 3 refills | Status: DC

## 2021-12-25 NOTE — Patient Instructions (Signed)
Medication Instructions:  STOP Aspirin  START Zetia 10 mg daily   *If you need a refill on your cardiac medications before your next appointment, please call your pharmacy*  Follow-Up: At [facility], you and your health needs are our priority.  As part of our continuing mission to provide you with exceptional heart care, we have created designated Provider Care Teams.  These Care Teams include your primary Cardiologist (physician) and Advanced Practice Providers (APPs -  Physician Assistants and Nurse Practitioners) who all work together to provide you with the care you need, when you need it.  We recommend signing up for the patient portal called "MyChart".  Sign up information is provided on this After Visit Summary.  MyChart is used to connect with patients for Virtual Visits (Telemedicine).  Patients are able to view lab/test results, encounter notes, upcoming appointments, etc.  Non-urgent messages can be sent to your provider as well.   To learn more about what you can do with MyChart, go to [email or web address].    Your next appointment:   12 month(s)  The format for your next appointment:   In Person  Provider:   Lennie Odor, MD or Marjie Skiff, PA-C, or [facility]

## 2022-01-02 ENCOUNTER — Telehealth: Payer: Self-pay | Admitting: *Deleted

## 2022-01-02 MED ORDER — HYDROCODONE-ACETAMINOPHEN 10-325 MG PO TABS: 1.0000 | ORAL_TABLET | Freq: Three times a day (TID) | ORAL | 0 refills | Status: DC | PRN

## 2022-01-02 NOTE — Telephone Encounter (Signed)
PMP was Reviewed.  Hydrocodone e-scribed today, placed a call to  Ms. Kiesel regarding the above. She verbalizes understanding.

## 2022-01-02 NOTE — Telephone Encounter (Signed)
Shannon French called for a refill on her hydrocodone. She will be out on Friday.

## 2022-01-18 ENCOUNTER — Telehealth: Payer: Self-pay | Admitting: *Deleted

## 2022-01-18 NOTE — Telephone Encounter (Signed)
Shannon French called and reported she had a fall in the yard and may have a hairline fracture in her arm. She was treated at [facility] ED and will bring paperwork when she sees [place] next Wednesday. They gave her Oxycodone to take for 4 days. She knows to not continue the hydrocodone while taking the oxycodone.She wanted to report this to avoid breaking CSA with our office.

## 2022-01-23 ENCOUNTER — Telehealth: Payer: Self-pay

## 2022-01-23 ENCOUNTER — Encounter: Payer: Medicare Other | Attending: Physical Medicine & Rehabilitation | Admitting: Registered Nurse

## 2022-01-23 ENCOUNTER — Encounter: Payer: Self-pay | Admitting: Registered Nurse

## 2022-01-23 VITALS — BP 157/92 | HR 68 | Ht 63.0 in | Wt 116.4 lb

## 2022-01-23 DIAGNOSIS — M542 Cervicalgia: Secondary | ICD-10-CM | POA: Diagnosis present

## 2022-01-23 DIAGNOSIS — M47817 Spondylosis without myelopathy or radiculopathy, lumbosacral region: Secondary | ICD-10-CM | POA: Diagnosis present

## 2022-01-23 DIAGNOSIS — Z5181 Encounter for therapeutic drug level monitoring: Secondary | ICD-10-CM

## 2022-01-23 DIAGNOSIS — G609 Hereditary and idiopathic neuropathy, unspecified: Secondary | ICD-10-CM | POA: Diagnosis present

## 2022-01-23 DIAGNOSIS — M25532 Pain in left wrist: Secondary | ICD-10-CM | POA: Diagnosis present

## 2022-01-23 DIAGNOSIS — G8929 Other chronic pain: Secondary | ICD-10-CM | POA: Diagnosis present

## 2022-01-23 DIAGNOSIS — M25511 Pain in right shoulder: Secondary | ICD-10-CM | POA: Insufficient documentation

## 2022-01-23 DIAGNOSIS — M546 Pain in thoracic spine: Secondary | ICD-10-CM

## 2022-01-23 DIAGNOSIS — Z79891 Long term (current) use of opiate analgesic: Secondary | ICD-10-CM | POA: Diagnosis present

## 2022-01-23 DIAGNOSIS — G894 Chronic pain syndrome: Secondary | ICD-10-CM

## 2022-01-23 DIAGNOSIS — M79602 Pain in left arm: Secondary | ICD-10-CM | POA: Diagnosis present

## 2022-01-23 DIAGNOSIS — M25512 Pain in left shoulder: Secondary | ICD-10-CM | POA: Diagnosis present

## 2022-01-23 DIAGNOSIS — M2550 Pain in unspecified joint: Secondary | ICD-10-CM | POA: Diagnosis present

## 2022-01-23 NOTE — Progress Notes (Signed)
Subjective:    Patient ID: Shannon French, female    DOB: 1966/01/26, 56 y.o.   MRN: [id number]  HPI: Shannon French is a 56 y.o. female who returns for follow up appointment for chronic pain and medication refill. She states her pain is located in her left arm, left wrist post fall. She also reports her pain is located in her neck, bilateral shoulders L>R, mid- back, right ring toe and left ankle pain. She rates her pain 10. Her current exercise regime is walking and performing stretching exercises.  Shannon French reports she fell last week Thursday, she was gardening and was pulling on a vine and fell backwards and landed left side. She went to [facility] for left fore=arm and left wrist pain. She had a  X-ray and was given a left wrist splint. Shannon French very tearful with the way she was treated at [facility], emotional support given.   Educated on fall prevention, she verbalizes understanding.   Today she will be going to [employer], she states.   Shannon French French equivalent is 30.00 MME.   Last UDS was Performed on 09/24/2021, consistent for medication.     Pain Inventory Average Pain 10 Pain Right Now 10 My pain is constant, sharp, burning, dull, stabbing, tingling, and aching  In the last 24 hours, has pain interfered with the following? General activity 10 Relation with others 10 Enjoyment of life 10 What TIME of day is your pain at its worst? morning , daytime, evening, and night Sleep (in general) Fair  Pain is worse with: walking, bending, sitting, inactivity, standing, and some activites Pain improves with: rest, heat/ice, therapy/exercise, pacing activities, and medication Relief from Meds: 1  Family History  Problem Relation Age of Onset  . Heart attack Mother   . Cancer Father   . Heart attack Brother    Social History   Socioeconomic History  . Marital status: Significant Other    Spouse name: Not on file  . Number of children: 1  . Years of  education: Not on file  . Highest education level: Not on file  Occupational History  . Occupation: Disabled  Tobacco Use  . Smoking status: Former    Years: 10.00    Types: Cigarettes    Quit date: 10/14/2009    Years since quitting: 12.2  . Smokeless tobacco: Never  Vaping Use  . Vaping Use: Never used  Substance and Sexual Activity  . Alcohol use: No  . Drug use: No  . Sexual activity: Not on file  Other Topics Concern  . Not on file  Social History Narrative  . Not on file   Social Determinants of Health   Financial Resource Strain: Not on file  Food Insecurity: Not on file  Transportation Needs: Not on file  Physical Activity: Not on file  Stress: Not on file  Social Connections: Not on file   Past Surgical History:  Procedure Laterality Date  . ABDOMINAL HYSTERECTOMY    . ABDOMINAL SURGERY    . APPENDECTOMY    . CHOLECYSTECTOMY    . CYST EXCISION    . OVARY SURGERY     Past Surgical History:  Procedure Laterality Date  . ABDOMINAL HYSTERECTOMY    . ABDOMINAL SURGERY    . APPENDECTOMY    . CHOLECYSTECTOMY    . CYST EXCISION    . OVARY SURGERY     Past Medical History:  Diagnosis Date  . Arm pain  left  . Degenerative disc disease   . Diabetes mellitus   . Fibromyalgia   . Hyperlipidemia   . Hypertension   . IBS (irritable bowel syndrome)   . Osteoporosis    BP (!) 154/100   Pulse 76   Ht 5\' 3"  (1.6 m)   Wt 116 lb 6.4 oz (52.8 kg)   SpO2 98%   BMI 20.62 kg/m   Opioid Risk Score:   Fall Risk Score:  `1  Depression screen [facility] 2/9     11/22/2021   10:08 AM 09/24/2021    9:43 AM 07/23/2021   10:24 AM 05/23/2021   10:21 AM 11/16/2020   10:08 AM 09/19/2020   10:02 AM 02/02/2020    9:32 AM  Depression screen PHQ 2/9  Decreased Interest 0 0 0 0 0 0 0  Down, Depressed, Hopeless 0 0 0 0 0 0 0  PHQ - 2 Score 0 0 0 0 0 0 0     Review of Systems  Constitutional: Negative.   HENT: Negative.    Eyes: Negative.   Respiratory: Negative.     Cardiovascular: Negative.   Gastrointestinal: Negative.   Endocrine: Negative.   Genitourinary: Negative.   Musculoskeletal: Negative.   Skin: Negative.   Allergic/Immunologic: Negative.   Neurological: Negative.   Hematological: Negative.   Psychiatric/Behavioral: Negative.        Objective:   Physical Exam Vitals and nursing note reviewed.  Constitutional:      Appearance: Normal appearance.  Cardiovascular:     Rate and Rhythm: Normal rate and regular rhythm.     Pulses: Normal pulses.     Heart sounds: Normal heart sounds.  Pulmonary:     Effort: Pulmonary effort is normal.     Breath sounds: Normal breath sounds.  Musculoskeletal:     Cervical back: Normal range of motion and neck supple.     Comments: Normal Muscle Bulk and Muscle Testing Reveals:  Upper Extremities: Right: Full ROM and Muscle Strength  5/5 Left Upper Extremity: Decreased ROM and Muscle Strength 4/5 Thoracic, Paraspinal Tenderness: T-7-T-9 Lumbar Paraspinal Tenderness: L-3-L-5 Lower Extremities: Full ROM and Muscle Strength 5/5 Arises from Table Slowly Narrow Based  Gait     Skin:    General: Skin is warm and dry.  Neurological:     Mental Status: She is alert and oriented to person, place, and time.  Psychiatric:        Mood and Affect: Mood normal.        Behavior: Behavior normal.          Assessment & Plan:  1. Bilateral sacroiliac joint dysfunction: 06/212023. Continue : HYDROcodone 7.5/325mg  one tablet every 6 hrs as needed # 120.  We will continue the opioid monitoring program, this consists of regular clinic visits, examinations, urine drug screen, pill counts as well as use of 02/2119 Controlled Substance Reporting system. A 12 month History has been reviewed on the [place] Controlled Substance Reporting System on 01/23/2022.  2. Chronic pain syndrome consistent with fibromyalgia. Continue with exercise regime, heat therapy,voltaren gel, lidocaine patches and topamax.  Continue current analgesics . 01/23/2022 3. Lumbosacral Spondylosis/ Bilateral lumbar radiculopathy:Continue Elavil and Cymbalta. 01/23/2022,  4. Complex Regional Pain Syndrome: Continue Cymbalta. .Dr. 01/25/2022 Following. 01/23/2022 5. Type 2 diabetes.: PCP Following. 01/23/2022 6. Muscle Spasms: Continue Tizanidine 4 mg one tablet every 8 hours as needed for muscle spasms. 01/23/2022 7. Insomnia: Continue Elavil. Continue to monitor. 01/23/2022 8.Cervical Radiculopathy:Continue Elavil: Continue to monitor.  01/23/2022 9. Cervicalgia/ Cervical Inflammation: Continue current medication regime. Alternate with heat and Ice Therapy. 01/23/2022 10. Bilateral Thoracic Pain:Continue HEP as Tolerated.  Continue current medication regime. Continue to Monitor. 01/23/2022 11. Bilateral  Shoulder Pain: Continue with HEP as Tolerated. Continue to Alternate Ice and Heat. Continue to monitor. Ortho Following. 06/21/20223   12. Bilateral Hand and Bilateral Feet Neuropathic Pain:  Continue current medication regimen. Continue HEP as tolerated. Continue to monitor. 01/23/2022 13. Polyarthralgia: Continue HEP as Tolerated. Continue to alternate Ice and Heat Therapy. Continue to monitor. 01/23/2022 14. Left Fore-Arm/ Left Wrist: She has an appointment with [facility] Today.  15. Fall at Home: Educated on Falls Prevention: She verbalizes understanding.    F/U in 2 months

## 2022-01-23 NOTE — Telephone Encounter (Signed)
Shannon French forgot to inform you that her insurance company would like her to have an Rx for Narcan on hand. If granted please send to [facility] in chart.

## 2022-01-25 ENCOUNTER — Other Ambulatory Visit: Payer: Self-pay | Admitting: Cardiovascular Disease

## 2022-01-25 ENCOUNTER — Telehealth: Payer: Self-pay | Admitting: Registered Nurse

## 2022-01-25 MED ORDER — OXYCODONE HCL 5 MG PO TABS: 5.0000 mg | ORAL_TABLET | Freq: Four times a day (QID) | ORAL | 0 refills | Status: DC | PRN

## 2022-01-31 ENCOUNTER — Telehealth: Payer: Self-pay

## 2022-01-31 MED ORDER — OXYCODONE HCL 5 MG PO TABS: 5.0000 mg | ORAL_TABLET | Freq: Four times a day (QID) | ORAL | 0 refills | Status: AC | PRN

## 2022-01-31 NOTE — Telephone Encounter (Signed)
Patient has a broken wrist. She was given a 7 day supply and need a new Rx sent for added pain.   Filled  Written  ID  Drug  QTY  Days  Prescriber  RX #  Dispenser  Refill  Daily Dose*  Pymt Type  PMP  01/25/2022 01/25/2022 1  Oxycodone Hcl (Ir) 5 Mg Tablet 28.00 7 Eu Tho [id number] Nor (9825) 0/0 30.00 MME Medicare [place] 01/02/2022 01/02/2022 1  Hydrocodone-Acetamin 10-325 Mg 90.00 30 Eu Tho [id number] Nor (9825) 0/0 30.00 MME Medicare [place]

## 2022-01-31 NOTE — Telephone Encounter (Signed)
I refilled oxycodone rx today.  Hopefully her wrist will be feeling better soon. I'm sorry to hear about that

## 2022-02-25 ENCOUNTER — Telehealth: Payer: Self-pay

## 2022-02-25 NOTE — Telephone Encounter (Signed)
Patient is calling for a refill on Hydrocodone. She states she is no longer taking the Oxycodone. Per PMP, last refill was 01/02/22

## 2022-02-26 MED ORDER — HYDROCODONE-ACETAMINOPHEN 10-325 MG PO TABS: 1.0000 | ORAL_TABLET | Freq: Three times a day (TID) | ORAL | 0 refills | Status: DC | PRN

## 2022-02-26 NOTE — Telephone Encounter (Signed)
Rx written and sent to the pharmacy. Thanks!  

## 2022-02-26 NOTE — Addendum Note (Signed)
Addended by: Faith Rogue T on: 02/26/2022 01:31 PM   Modules accepted: Orders

## 2022-03-27 ENCOUNTER — Encounter: Payer: Self-pay | Admitting: Registered Nurse

## 2022-03-27 ENCOUNTER — Encounter: Payer: Medicare Other | Attending: Physical Medicine & Rehabilitation | Admitting: Registered Nurse

## 2022-03-27 VITALS — Ht 63.0 in | Wt 114.0 lb

## 2022-03-27 DIAGNOSIS — G609 Hereditary and idiopathic neuropathy, unspecified: Secondary | ICD-10-CM | POA: Diagnosis present

## 2022-03-27 DIAGNOSIS — M2550 Pain in unspecified joint: Secondary | ICD-10-CM

## 2022-03-27 DIAGNOSIS — M542 Cervicalgia: Secondary | ICD-10-CM | POA: Diagnosis present

## 2022-03-27 DIAGNOSIS — G894 Chronic pain syndrome: Secondary | ICD-10-CM

## 2022-03-27 DIAGNOSIS — M546 Pain in thoracic spine: Secondary | ICD-10-CM | POA: Diagnosis present

## 2022-03-27 DIAGNOSIS — Z79891 Long term (current) use of opiate analgesic: Secondary | ICD-10-CM | POA: Diagnosis present

## 2022-03-27 DIAGNOSIS — Z5181 Encounter for therapeutic drug level monitoring: Secondary | ICD-10-CM | POA: Diagnosis present

## 2022-03-27 DIAGNOSIS — M47817 Spondylosis without myelopathy or radiculopathy, lumbosacral region: Secondary | ICD-10-CM | POA: Diagnosis present

## 2022-03-27 DIAGNOSIS — M5412 Radiculopathy, cervical region: Secondary | ICD-10-CM | POA: Diagnosis present

## 2022-03-27 MED ORDER — HYDROCODONE-ACETAMINOPHEN 10-325 MG PO TABS: 1.0000 | ORAL_TABLET | Freq: Three times a day (TID) | ORAL | 0 refills | Status: DC | PRN

## 2022-03-27 NOTE — Patient Instructions (Addendum)
Send a My-Chart message on 04/23/2022 with update on medication change.

## 2022-03-27 NOTE — Progress Notes (Unsigned)
Subjective:    Patient ID: Shannon French, female    DOB: 1965-09-04, 56 y.o.   MRN: [id number]  HPI: Shannon French is a 56 y.o. female who returns for follow up appointment for chronic pain and medication refill. states *** pain is located in  ***. rates pain ***. current exercise regime is walking and performing stretching exercises.  [name] Morphine equivalent is *** MME.   Last UDS was Performed on 09/24/2021, see note for details.      Pain Inventory Average Pain 5 Pain Right Now 5 My pain is constant, sharp, burning, dull, stabbing, tingling, and aching  In the last 24 hours, has pain interfered with the following? General activity 5 Relation with others 3 Enjoyment of life 4 What TIME of day is your pain at its worst? morning , daytime, evening, and night Sleep (in general) Fair  Pain is worse with: walking, bending, sitting, inactivity, standing, and some activites Pain improves with: rest, heat/ice, therapy/exercise, pacing activities, and medication Relief from Meds: 5  Family History  Problem Relation Age of Onset   Heart attack Mother    Cancer Father    Heart attack Brother    Social History   Socioeconomic History   Marital status: Significant Other    Spouse name: Not on file   Number of children: 1   Years of education: Not on file   Highest education level: Not on file  Occupational History   Occupation: Disabled  Tobacco Use   Smoking status: Former    Years: 10.00    Types: Cigarettes    Quit date: 10/14/2009    Years since quitting: 12.4   Smokeless tobacco: Never  Vaping Use   Vaping Use: Never used  Substance and Sexual Activity   Alcohol use: No   Drug use: No   Sexual activity: Not on file  Other Topics Concern   Not on file  Social History Narrative   Not on file   Social Determinants of Health   Financial Resource Strain: Not on file  Food Insecurity: Not on file  Transportation Needs: Not on file  Physical Activity: Not on  file  Stress: Not on file  Social Connections: Not on file   Past Surgical History:  Procedure Laterality Date   ABDOMINAL HYSTERECTOMY     ABDOMINAL SURGERY     APPENDECTOMY     CHOLECYSTECTOMY     CYST EXCISION     OVARY SURGERY     Past Surgical History:  Procedure Laterality Date   ABDOMINAL HYSTERECTOMY     ABDOMINAL SURGERY     APPENDECTOMY     CHOLECYSTECTOMY     CYST EXCISION     OVARY SURGERY     Past Medical History:  Diagnosis Date   Arm pain    left   Degenerative disc disease    Diabetes mellitus    Fibromyalgia    Hyperlipidemia    Hypertension    IBS (irritable bowel syndrome)    Osteoporosis    Ht 5\' 3"  (1.6 m)   Wt 114 lb (51.7 kg)   BMI 20.19 kg/m   Opioid Risk Score:   Fall Risk Score:  `1  Depression screen [facility] 2/9     11/22/2021   10:08 AM 09/24/2021    9:43 AM 07/23/2021   10:24 AM 05/23/2021   10:21 AM 11/16/2020   10:08 AM 09/19/2020   10:02 AM 02/02/2020    9:32 AM  Depression screen PHQ  2/9  Decreased Interest 0 0 0 0 0 0 0  Down, Depressed, Hopeless 0 0 0 0 0 0 0  PHQ - 2 Score 0 0 0 0 0 0 0    Review of Systems  Musculoskeletal:  Positive for arthralgias and back pain.       Left shoulder pain, pain in both feet, both hands   All other systems reviewed and are negative.      Objective:   Physical Exam        Assessment & Plan:  1. Bilateral sacroiliac joint dysfunction: 06/212023. Continue : HYDROcodone 7.5/325mg  one tablet every 6 hrs as needed # 120.  We will continue the opioid monitoring program, this consists of regular clinic visits, examinations, urine drug screen, pill counts as well as use of [place] Controlled Substance Reporting system. A 12 month History has been reviewed on the [place] Controlled Substance Reporting System on 01/23/2022.  2. Chronic pain syndrome consistent with fibromyalgia. Continue with exercise regime, heat therapy,voltaren gel, lidocaine patches and topamax. Continue  current analgesics . 01/23/2022 3. Lumbosacral Spondylosis/ Bilateral lumbar radiculopathy:Continue Elavil and Cymbalta. 01/23/2022,  4. Complex Regional Pain Syndrome: Continue Cymbalta. .Dr. Yolanda Bonine Following. 01/23/2022 5. Type 2 diabetes.: PCP Following. 01/23/2022 6. Muscle Spasms: Continue Tizanidine 4 mg one tablet every 8 hours as needed for muscle spasms. 01/23/2022 7. Insomnia: Continue Elavil. Continue to monitor. 01/23/2022 8.Cervical Radiculopathy:Continue Elavil: Continue to monitor. 01/23/2022 9. Cervicalgia/ Cervical Inflammation: Continue current medication regime. Alternate with heat and Ice Therapy. 01/23/2022 10. Bilateral Thoracic Pain:Continue HEP as Tolerated.  Continue current medication regime. Continue to Monitor. 01/23/2022 11. Bilateral  Shoulder Pain: Continue with HEP as Tolerated. Continue to Alternate Ice and Heat. Continue to monitor. Ortho Following. 06/21/20223   12. Bilateral Hand and Bilateral Feet Neuropathic Pain:  Continue current medication regimen. Continue HEP as tolerated. Continue to monitor. 01/23/2022 13. Polyarthralgia: Continue HEP as Tolerated. Continue to alternate Ice and Heat Therapy. Continue to monitor. 01/23/2022 14. Left Fore-Arm/ Left Wrist: She has an appointment with [facility] Today.  15. Fall at Home: Educated on Falls Prevention: She verbalizes understanding.    F/U in 2 months

## 2022-03-28 ENCOUNTER — Encounter: Payer: Self-pay | Admitting: Registered Nurse

## 2022-04-02 ENCOUNTER — Other Ambulatory Visit: Payer: Self-pay | Admitting: Registered Nurse

## 2022-04-02 ENCOUNTER — Other Ambulatory Visit: Payer: Self-pay | Admitting: Physical Medicine & Rehabilitation

## 2022-04-02 DIAGNOSIS — M797 Fibromyalgia: Secondary | ICD-10-CM

## 2022-04-02 DIAGNOSIS — G90512 Complex regional pain syndrome I of left upper limb: Secondary | ICD-10-CM

## 2022-04-02 DIAGNOSIS — M47817 Spondylosis without myelopathy or radiculopathy, lumbosacral region: Secondary | ICD-10-CM

## 2022-04-02 DIAGNOSIS — G894 Chronic pain syndrome: Secondary | ICD-10-CM

## 2022-05-03 ENCOUNTER — Telehealth: Payer: Self-pay | Admitting: Registered Nurse

## 2022-05-03 MED ORDER — HYDROCODONE-ACETAMINOPHEN 10-325 MG PO TABS: 1.0000 | ORAL_TABLET | Freq: Three times a day (TID) | ORAL | 0 refills | Status: DC | PRN

## 2022-05-03 NOTE — Telephone Encounter (Signed)
PMP was Reviewed,  Hydrocodone e- scribed today Sent a My-chart message to Ms. Trefz regarding the above.

## 2022-05-28 ENCOUNTER — Encounter: Payer: Medicare Other | Attending: Physical Medicine & Rehabilitation | Admitting: Registered Nurse

## 2022-05-28 ENCOUNTER — Encounter: Payer: Self-pay | Admitting: Registered Nurse

## 2022-05-28 VITALS — BP 157/91 | HR 80 | Ht 63.0 in | Wt 115.4 lb

## 2022-05-28 DIAGNOSIS — G609 Hereditary and idiopathic neuropathy, unspecified: Secondary | ICD-10-CM | POA: Insufficient documentation

## 2022-05-28 DIAGNOSIS — M7062 Trochanteric bursitis, left hip: Secondary | ICD-10-CM | POA: Insufficient documentation

## 2022-05-28 DIAGNOSIS — M25512 Pain in left shoulder: Secondary | ICD-10-CM | POA: Diagnosis present

## 2022-05-28 DIAGNOSIS — Z79891 Long term (current) use of opiate analgesic: Secondary | ICD-10-CM | POA: Insufficient documentation

## 2022-05-28 DIAGNOSIS — G894 Chronic pain syndrome: Secondary | ICD-10-CM | POA: Diagnosis present

## 2022-05-28 DIAGNOSIS — M7061 Trochanteric bursitis, right hip: Secondary | ICD-10-CM | POA: Insufficient documentation

## 2022-05-28 DIAGNOSIS — M25511 Pain in right shoulder: Secondary | ICD-10-CM | POA: Insufficient documentation

## 2022-05-28 DIAGNOSIS — M542 Cervicalgia: Secondary | ICD-10-CM | POA: Diagnosis present

## 2022-05-28 DIAGNOSIS — M47817 Spondylosis without myelopathy or radiculopathy, lumbosacral region: Secondary | ICD-10-CM | POA: Diagnosis present

## 2022-05-28 DIAGNOSIS — M546 Pain in thoracic spine: Secondary | ICD-10-CM | POA: Insufficient documentation

## 2022-05-28 DIAGNOSIS — Z5181 Encounter for therapeutic drug level monitoring: Secondary | ICD-10-CM | POA: Insufficient documentation

## 2022-05-28 DIAGNOSIS — M2550 Pain in unspecified joint: Secondary | ICD-10-CM | POA: Diagnosis present

## 2022-05-28 DIAGNOSIS — G8929 Other chronic pain: Secondary | ICD-10-CM | POA: Insufficient documentation

## 2022-05-28 MED ORDER — NALOXONE HCL 4 MG/0.1ML NA LIQD: NASAL | 0 refills | Status: AC

## 2022-05-28 MED ORDER — HYDROCODONE-ACETAMINOPHEN 10-325 MG PO TABS: 1.0000 | ORAL_TABLET | Freq: Three times a day (TID) | ORAL | 0 refills | Status: DC | PRN

## 2022-05-28 NOTE — Progress Notes (Signed)
Subjective:    Patient ID: Shannon French, female    DOB: 25-Aug-1965, 56 y.o.   MRN: [id number]  HPI: Kryslyn Helbig is a 56 y.o. female who returns for follow up appointment for chronic pain and medication refill. She states her pain is located in her neck, bilateral shoulders, mid- lower back pain. She reports increase intensity of lower back pain asked about having Right Radiofrequency, last procedure was performed in June 2018 with good relief noted. She will be scheduled with Dr Letta Pate in December to discuss the procedure, she verbalizes understanding.  She also reports bilateral hands and bilateral feet pain with tingling and numbness. She rates her pain 6. Her current exercise regime is attending the [facility], walking and performing stretching exercises.  Ms. Cutbirth Morphine equivalent is 30.77 MME.   UDS ordered today.     Pain Inventory Average Pain 6 Pain Right Now 6 My pain is constant, sharp, burning, dull, stabbing, tingling, and aching  In the last 24 hours, has pain interfered with the following? General activity 5 Relation with others 5 Enjoyment of life 5 What TIME of day is your pain at its worst? morning , daytime, evening, and night Sleep (in general) Fair  Pain is worse with: walking, bending, sitting, inactivity, standing, and some activites Pain improves with: rest, heat/ice, therapy/exercise, pacing activities, and medication Relief from Meds: 5  Family History  Problem Relation Age of Onset   Heart attack Mother    Cancer Father    Heart attack Brother    Social History   Socioeconomic History   Marital status: Significant Other    Spouse name: Not on file   Number of children: 1   Years of education: Not on file   Highest education level: Not on file  Occupational History   Occupation: Disabled  Tobacco Use   Smoking status: Former    Years: 10.00    Types: Cigarettes    Quit date: 10/14/2009    Years since quitting: 12.6   Smokeless tobacco: Never   Vaping Use   Vaping Use: Never used  Substance and Sexual Activity   Alcohol use: No   Drug use: No   Sexual activity: Not on file  Other Topics Concern   Not on file  Social History Narrative   Not on file   Social Determinants of Health   Financial Resource Strain: Not on file  Food Insecurity: Not on file  Transportation Needs: Not on file  Physical Activity: Not on file  Stress: Not on file  Social Connections: Not on file   Past Surgical History:  Procedure Laterality Date   ABDOMINAL HYSTERECTOMY     ABDOMINAL SURGERY     APPENDECTOMY     CHOLECYSTECTOMY     CYST EXCISION     OVARY SURGERY     Past Surgical History:  Procedure Laterality Date   ABDOMINAL HYSTERECTOMY     ABDOMINAL SURGERY     APPENDECTOMY     CHOLECYSTECTOMY     CYST EXCISION     OVARY SURGERY     Past Medical History:  Diagnosis Date   Arm pain    left   Degenerative disc disease    Diabetes mellitus    Fibromyalgia    Hyperlipidemia    Hypertension    IBS (irritable bowel syndrome)    Osteoporosis    BP (!) 157/91   Pulse 80   Ht 5\' 3"  (1.6 m)   Wt 115 lb 6.4  oz (52.3 kg)   SpO2 98%   BMI 20.44 kg/m   Opioid Risk Score:   Fall Risk Score:  `1  Depression screen [facility] 2/9     05/28/2022    8:09 AM 03/27/2022    8:34 AM 11/22/2021   10:08 AM 09/24/2021    9:43 AM 07/23/2021   10:24 AM 05/23/2021   10:21 AM 11/16/2020   10:08 AM  Depression screen PHQ 2/9  Decreased Interest 0 0 0 0 0 0 0  Down, Depressed, Hopeless 0 0 0 0 0 0 0  PHQ - 2 Score 0 0 0 0 0 0 0     Review of Systems  Constitutional: Negative.   HENT: Negative.    Eyes: Negative.   Respiratory: Negative.    Cardiovascular: Negative.   Gastrointestinal: Negative.   Endocrine: Negative.   Genitourinary: Negative.   Musculoskeletal:  Positive for arthralgias, back pain, myalgias and neck pain.  Skin: Negative.   Allergic/Immunologic: Negative.   Neurological: Negative.   Hematological: Negative.    Psychiatric/Behavioral: Negative.    All other systems reviewed and are negative.      Objective:   Physical Exam Vitals and nursing note reviewed.  Constitutional:      Appearance: Normal appearance.  Neck:     Comments: Cervical Paraspinal Tenderness: C-5- C-6  Cardiovascular:     Rate and Rhythm: Normal rate and regular rhythm.     Pulses: Normal pulses.     Heart sounds: Normal heart sounds.  Pulmonary:     Effort: Pulmonary effort is normal.     Breath sounds: Normal breath sounds.  Musculoskeletal:     Cervical back: Normal range of motion and neck supple.     Comments: Normal Muscle Bulk and Muscle Testing Reveals:  Upper Extremities: Full ROM and Muscle Strength 5/5 Thoracic Hypersensitivity: T-3- T-7 Lumbar Hypersensitivity Bilateral Greater Trochanter Tenderness Lower Extremities: Full ROM and Muscle Strength 5/5 Arises from Chair with ease Narrow Based  Gait     Skin:    General: Skin is warm and dry.  Neurological:     Mental Status: She is alert and oriented to person, place, and time.  Psychiatric:        Mood and Affect: Mood normal.        Behavior: Behavior normal.         Assessment & Plan:  1. Bilateral sacroiliac joint dysfunction: 10/242023. Continue slow weaning : Refilled: HYDROcodone 10/325mg  one tablet every 8 hrs as needed # 80.  We will continue the opioid monitoring program, this consists of regular clinic visits, examinations, urine drug screen, pill counts as well as use of [place] Controlled Substance Reporting system. A 12 month History has been reviewed on the [place] Controlled Substance Reporting System on 05/28/2022.  2. Chronic pain syndrome consistent with fibromyalgia. Continue with exercise regime, heat therapy,voltaren gel, lidocaine patches and topamax. Continue current analgesics . 05/28/2022 3. Lumbosacral Spondylosis/ Bilateral lumbar radiculopathy:Scheduled Radiofrequency with Dr. Wynn Banker  in January, she  will be scheduled with Dr Wynn Banker in December to discussed the radiofrequency. Last Radiofrequency was in June 2018, with good relief noted. Continue Elavil and Cymbalta. 05/28/2022,  4. Complex Regional Pain Syndrome: Continue Cymbalta. .Dr. Yolanda Bonine Following. 05/28/2022 5. Type 2 diabetes.: PCP Following. 05/28/2022 6. Muscle Spasms: Continue Tizanidine 4 mg one tablet every 8 hours as needed for muscle spasms. 05/28/2022 7. Insomnia: Continue Elavil. Continue to monitor. 05/28/2022 8.Cervical Radiculopathy:No complaints today. Continue Elavil: Continue to monitor.  05/28/2022 9. Cervicalgia: Continue current medication regime. Alternate with heat and Ice Therapy. 05/28/2022 10. Bilateral Thoracic Pain:Continue HEP as Tolerated.  Continue current medication regime. Continue to Monitor. 05/28/2022 11. Left  Shoulder Pain: Continue with HEP as Tolerated. Continue to Alternate Ice and Heat. Continue to monitor. Ortho Following. 10/24/20223   12. Bilateral Hand and Bilateral Feet Neuropathic Pain:  Continue current medication regimen. Continue HEP as tolerated. Continue to monitor. 05/28/2022 13. Polyarthralgia: Continue HEP as Tolerated. Continue to alternate Ice and Heat Therapy. Continue to monitor. 05/28/2022  F/U in 2 months

## 2022-06-02 LAB — TOXASSURE SELECT,+ANTIDEPR,UR

## 2022-06-05 ENCOUNTER — Telehealth: Payer: Self-pay | Admitting: *Deleted

## 2022-06-05 NOTE — Telephone Encounter (Signed)
Urine drug screen for this encounter is consistent for prescribed medication 

## 2022-06-06 ENCOUNTER — Other Ambulatory Visit: Payer: Self-pay | Admitting: Registered Nurse

## 2022-06-06 ENCOUNTER — Telehealth: Payer: Self-pay | Admitting: Registered Nurse

## 2022-06-06 MED ORDER — HYDROCODONE-ACETAMINOPHEN 5-325 MG PO TABS: ORAL_TABLET | ORAL | 0 refills | Status: DC

## 2022-06-06 MED ORDER — DULOXETINE HCL 60 MG PO CPEP: 60.0000 mg | ORAL_CAPSULE | Freq: Every day | ORAL | 2 refills | Status: DC

## 2022-06-06 NOTE — Telephone Encounter (Signed)
Hydrocodone e-scribed today.

## 2022-06-18 ENCOUNTER — Encounter: Payer: Self-pay | Admitting: Physical Medicine & Rehabilitation

## 2022-06-18 ENCOUNTER — Encounter: Payer: Medicare Other | Attending: Physical Medicine & Rehabilitation | Admitting: Physical Medicine & Rehabilitation

## 2022-06-18 VITALS — BP 141/90 | HR 77 | Ht 63.0 in | Wt 117.8 lb

## 2022-06-18 DIAGNOSIS — M533 Sacrococcygeal disorders, not elsewhere classified: Secondary | ICD-10-CM | POA: Diagnosis present

## 2022-06-18 DIAGNOSIS — G8929 Other chronic pain: Secondary | ICD-10-CM | POA: Insufficient documentation

## 2022-06-18 MED ORDER — DIAZEPAM 5 MG PO TABS: 5.0000 mg | ORAL_TABLET | Freq: Once | ORAL | 0 refills | Status: AC

## 2022-06-18 NOTE — Patient Instructions (Signed)
May take 5mg  valium night before and ~1/2 hr prior to injection

## 2022-06-18 NOTE — Progress Notes (Signed)
Subjective:    Patient ID: Shannon French, female    DOB: 06-Mar-1966, 56 y.o.   MRN: [id number]  HPI   02/21/2017  L L4 medial branch, L L5 dorsal ramus radiofrequency ablation  Left  S1,S2,S3, neurolysis under fluoro guidance   01/10/2017    R L4 medial branch, R L5 dorsal ramus radiofrequency ablation Right S1,S2,S3, neurolysis under fluoro guidance     L>R side pain mainly below belt line. No sciatic discomfort going into the legs  No new weakness in the lower extremities. The patient remains functionally independent and takes care of domestic pets as well as farm animals  pain Inventory Average Pain 6 Pain Right Now 7 My pain is constant, sharp, burning, dull, stabbing, tingling, and aching  In the last 24 hours, has pain interfered with the following? General activity 5 Relation with others 5 Enjoyment of life 5 What TIME of day is your pain at its worst? morning  and night Sleep (in general) Fair  Pain is worse with: walking, bending, sitting, standing, and some activites Pain improves with: rest, heat/ice, therapy/exercise, pacing activities, and medication Relief from Meds: 5  Family History  Problem Relation Age of Onset   Heart attack Mother    Cancer Father    Heart attack Brother    Social History   Socioeconomic History   Marital status: Significant Other    Spouse name: Not on file   Number of children: 1   Years of education: Not on file   Highest education level: Not on file  Occupational History   Occupation: Disabled  Tobacco Use   Smoking status: Former    Years: 10.00    Types: Cigarettes    Quit date: 10/14/2009    Years since quitting: 12.6   Smokeless tobacco: Never  Vaping Use   Vaping Use: Never used  Substance and Sexual Activity   Alcohol use: No   Drug use: No   Sexual activity: Not on file  Other Topics Concern   Not on file  Social History Narrative   Not on file   Social Determinants of Health   Financial Resource  Strain: Not on file  Food Insecurity: Not on file  Transportation Needs: Not on file  Physical Activity: Not on file  Stress: Not on file  Social Connections: Not on file   Past Surgical History:  Procedure Laterality Date   ABDOMINAL HYSTERECTOMY     ABDOMINAL SURGERY     APPENDECTOMY     CHOLECYSTECTOMY     CYST EXCISION     OVARY SURGERY     Past Surgical History:  Procedure Laterality Date   ABDOMINAL HYSTERECTOMY     ABDOMINAL SURGERY     APPENDECTOMY     CHOLECYSTECTOMY     CYST EXCISION     OVARY SURGERY     Past Medical History:  Diagnosis Date   Arm pain    left   Degenerative disc disease    Diabetes mellitus    Fibromyalgia    Hyperlipidemia    Hypertension    IBS (irritable bowel syndrome)    Osteoporosis    Ht 5\' 3"  (1.6 m)   Wt 117 lb 12.8 oz (53.4 kg)   BMI 20.87 kg/m   Opioid Risk Score:   Fall Risk Score:  `1  Depression screen [facility] 2/9     05/28/2022    8:09 AM 03/27/2022    8:34 AM 11/22/2021   10:08 AM 09/24/2021  9:43 AM 07/23/2021   10:24 AM 05/23/2021   10:21 AM 11/16/2020   10:08 AM  Depression screen PHQ 2/9  Decreased Interest 0 0 0 0 0 0 0  Down, Depressed, Hopeless 0 0 0 0 0 0 0  PHQ - 2 Score 0 0 0 0 0 0 0      Review of Systems  Musculoskeletal:  Positive for back pain.  All other systems reviewed and are negative.     Objective:   Physical Exam  General no acute distress Mood and affect are appropriate Negative straight leg raising bilaterally Deep tendon reflexes 2+ bilateral knees and ankles Ambulates without assist device no evidence of toe drag or knee instability  Sacral thrust (prone) : Positive on left, lateral compression: Positive on left FABER's: Positive on left Distraction (supine): Positive bilateral Thigh thrust test: Positive on left Lumbar spine without tenderness palpation along the lower lumbar paraspinals Lumbar spine range of motion is normal in flexion extension lateral bending.     Assessment & Plan:   1.  Chronic sacroiliac pain recurrent.  She had approximately 4-year relief with radiofrequency neurotomy, gradually increasing pain over the last year left side greater than right. We will repeat left-sided radiofrequency neurotomy in 4-6 wks Consider right side repeat RF as well if this pain becomes more apparent after treating left side.

## 2022-07-05 ENCOUNTER — Ambulatory Visit: Payer: Medicare Other | Admitting: Physical Medicine & Rehabilitation

## 2022-07-16 ENCOUNTER — Other Ambulatory Visit: Payer: Self-pay | Admitting: Registered Nurse

## 2022-07-18 ENCOUNTER — Telehealth: Payer: Self-pay | Admitting: Registered Nurse

## 2022-07-18 MED ORDER — HYDROCODONE-ACETAMINOPHEN 5-325 MG PO TABS: 1.0000 | ORAL_TABLET | Freq: Four times a day (QID) | ORAL | 0 refills | Status: DC | PRN

## 2022-07-18 NOTE — Telephone Encounter (Signed)
PMP was Reviewed.  Shannon French tolerating Hydrocodone slow weaning.  Hydrocodone prescription e- scribed today.  This provider spoke with Shannon French regarding the above, she verbalizes understanding.

## 2022-07-24 ENCOUNTER — Telehealth: Payer: Self-pay

## 2022-07-24 NOTE — Telephone Encounter (Signed)
Patient is scheduled for Left Radiofrequency on 08/08/22  and has [facility] Medicare. Patient says both sides bother her but left is worse. Would you like to do both sides?

## 2022-08-08 ENCOUNTER — Encounter: Payer: Medicare Other | Attending: Physical Medicine & Rehabilitation | Admitting: Physical Medicine & Rehabilitation

## 2022-08-08 ENCOUNTER — Encounter: Payer: Self-pay | Admitting: Physical Medicine & Rehabilitation

## 2022-08-08 VITALS — BP 123/92 | HR 81 | Ht 63.0 in | Wt 117.0 lb

## 2022-08-08 DIAGNOSIS — G8929 Other chronic pain: Secondary | ICD-10-CM | POA: Insufficient documentation

## 2022-08-08 DIAGNOSIS — M533 Sacrococcygeal disorders, not elsewhere classified: Secondary | ICD-10-CM | POA: Diagnosis present

## 2022-08-08 MED ORDER — HYDROCODONE-ACETAMINOPHEN 5-325 MG PO TABS: 1.0000 | ORAL_TABLET | Freq: Four times a day (QID) | ORAL | 0 refills | Status: DC | PRN

## 2022-08-08 MED ORDER — LIDOCAINE HCL (PF) 2 % IJ SOLN
5.0000 mL | Freq: Once | INTRAMUSCULAR | Status: AC
  Administered 2022-08-08: 5 mL

## 2022-08-08 MED ORDER — LIDOCAINE HCL 1 % IJ SOLN
10.0000 mL | Freq: Once | INTRAMUSCULAR | Status: AC
  Administered 2022-08-08: 10 mL

## 2022-08-08 NOTE — Progress Notes (Signed)
  PROCEDURE RECORD [facility]   Name: Shannon French DOB:[birth year] MRN: [id number]  Date:08/08/2022  Physician: Alysia Penna, MD    Nurse/CMA: Truman Hayward, CMA  Allergies:  Allergies  Allergen Reactions   Gabapentin [Gabapentin] Swelling    Suspects allergy was to dye; capsule was yellow   Actos [Pioglitazone Hydrochloride] Other (See Comments) and Hypertension    HEADACHES   Esomeprazole Magnesium Hives   Glucophage [Metformin Hydrochloride] Hives and Swelling   Iodinated Contrast Media     Other reaction(s): Other (See Comments) Unknown   Mepivacaine Hcl Hives and Itching    carbocaine Other reaction(s): Unknown   Other Other (See Comments)    Uncoded Allergy. Allergen: BICARBOCAINE XRAY DYE - THROAT SWELLS   Pioglitazone Other (See Comments)    Other reaction(s): irregular heart rate, Unknown   Sulfa Antibiotics Hives    Other reaction(s): Unknown    Consent Signed: Yes.    Is patient diabetic? Yes.    CBG today? 95  Pregnant: No. LMP: No LMP recorded. Patient has had a hysterectomy. (age 1-55)  Anticoagulants: no Anti-inflammatory: yes (diclofenac) Antibiotics: no  Procedure: Left L4,L5,S1,2,3 Radiofrequency  Position: Prone Start Time: 9:59 am  End Time: 10:28 am  Fluoro Time: 1:19  RN/CMA Truman Hayward, CMA Brewer Hitchman, CMA    Time 9:46 am 10:45 am    BP 123/92 152/104    Pulse 78 78    Respirations 16 16    O2 Sat 78 98    S/S 6 6    Pain Level 7/10 8/10     D/C home with husband, patient A & O X 3, D/C instructions reviewed, and sits independently.

## 2022-08-08 NOTE — Patient Instructions (Signed)
You had a radio frequency procedure today This was done to alleviate joint pain in your lumbar area We injected lidocaine which is a local anesthetic.  You may experience soreness at the injection sites. You may also experienced some irritation of the nerves that were heated I'm recommending ice for 30 minutes every 2 hours as needed for the next 24-48 hours   

## 2022-08-08 NOTE — Progress Notes (Signed)
L L4 medial branch, L L5 dorsal ramus radiofrequency ablation  Left  S1,S2,S3, neurolysis under fluoro guidance   Informed consent was obtained after discussing risks and benefits of the procedure with the patient area and this includes bleeding, bruising, infection. Patient elects to proceed and has given written consent. Patient placed in a prone position low back and buttocks area on the right side marked and prepped with Betadine. Then a 25-gauge 1.5 inch needle was used to anesthetize the skin and subcutaneous tissue with 1% lidocaine, 2 cc into each of 5 areas. Then a 10 cm RF needle with a 1 cm curved active tip was inserted first targeting the junction of the sacroiliac and S1 SAP AP and lateral images confirm proper needle location followed by  motor stimulation at 2 Hz respectively confirming proper needle location then a solution containing   1% MPF lidocaine was injected times 1 cc. RF lesioning 80C 90 seconds. Then the left L5 SAP transverse process junction was targeted needle placed at junction AP and lateral images confirm proper normal location.  motor stimulation at 2 Hz confirm proper needle location followed by injection of 1 cc of the  lidocaine solution and radiofrequency lesioning 80C 90 seconds. Then the lateral aspect of the S1-S2 and S3 foramen or targeted on the leftt side needle was placed just lateral to the foramen. Sensory stimulation showed elicited paresthesias. Followed by injection of 1 cc of the lidocaine solution at each site and radiofrequency lesioning 80C 90 seconds. Patient tolerated procedure well Post procedure instructions given

## 2022-09-16 ENCOUNTER — Other Ambulatory Visit: Payer: Self-pay | Admitting: Physical Medicine & Rehabilitation

## 2022-09-16 NOTE — Telephone Encounter (Signed)
Requesting refill for Hydrocodone and diazepam for the procedure scheduled

## 2022-09-17 MED ORDER — DIAZEPAM 5 MG PO TABS: ORAL_TABLET | ORAL | 0 refills | Status: DC

## 2022-09-18 ENCOUNTER — Other Ambulatory Visit: Payer: Self-pay | Admitting: Physical Medicine & Rehabilitation

## 2022-09-18 ENCOUNTER — Telehealth: Payer: Self-pay | Admitting: Physical Medicine & Rehabilitation

## 2022-09-18 MED ORDER — DIAZEPAM 5 MG PO TABS: ORAL_TABLET | ORAL | 0 refills | Status: DC

## 2022-09-18 MED ORDER — HYDROCODONE-ACETAMINOPHEN 5-325 MG PO TABS: 1.0000 | ORAL_TABLET | Freq: Four times a day (QID) | ORAL | 0 refills | Status: DC | PRN

## 2022-09-18 NOTE — Telephone Encounter (Signed)
Patient called in states she is at the pharmacy and pharamcy has not received her medication Valium 17m , also requesting to ask if she could get a refill for Hydrocodone

## 2022-09-18 NOTE — Telephone Encounter (Signed)
LM for patient that valium rx had been sent to her pharmacy [facility]. If she had any further question or concerns please contact our off.

## 2022-09-19 ENCOUNTER — Encounter: Payer: Self-pay | Admitting: Physical Medicine & Rehabilitation

## 2022-09-19 ENCOUNTER — Encounter: Payer: Medicare Other | Attending: Physical Medicine & Rehabilitation | Admitting: Physical Medicine & Rehabilitation

## 2022-09-19 VITALS — BP 119/84 | HR 80 | Temp 98.4°F | Ht 63.0 in | Wt 121.0 lb

## 2022-09-19 DIAGNOSIS — G8929 Other chronic pain: Secondary | ICD-10-CM | POA: Diagnosis present

## 2022-09-19 DIAGNOSIS — M533 Sacrococcygeal disorders, not elsewhere classified: Secondary | ICD-10-CM | POA: Insufficient documentation

## 2022-09-19 MED ORDER — LIDOCAINE HCL (PF) 2 % IJ SOLN
5.0000 mL | Freq: Once | INTRAMUSCULAR | Status: AC
  Administered 2022-09-19: 5 mL

## 2022-09-19 MED ORDER — LIDOCAINE HCL 1 % IJ SOLN
10.0000 mL | Freq: Once | INTRAMUSCULAR | Status: AC
  Administered 2022-09-19: 10 mL

## 2022-09-19 NOTE — Progress Notes (Signed)
R L4 medial branch, R L5 dorsal ramus radiofrequency ablation Right S1,S2,S3, neurolysis under fluoro guidance  Informed consent was obtained after discussing risks and benefits of the procedure with the patient area and this includes bleeding, bruising, infection. Patient elects to proceed and has given written consent. Patient placed in a prone position low back and buttocks area on the right side marked and prepped with Betadine. Then a 25-gauge 1.5 inch needle was used to anesthetize the skin and subcutaneous tissue with 1% lidocaine, 2 cc into each of 5 areas. Then a 10 cm RF needle with a 1 cm curved active tip was inserted first targeting the junction of the sacroiliac and S1 SAP AP and lateral images confirm proper needle location followed by sensory and motor stimulation at 50 Hz and 2 Hz respectively confirming proper needle location then a solution containing 1 cc of 4 mg/cc dexamethasone and 4 cc of 1% MPF lidocaine was injected times 1 cc. RF lesioning 80C 90 seconds. Then the right L5 SAP transverse process junction was targeted needle placed at junction AP and lateral images confirm proper normal location. Sensory stimulation at 50 Hz followed by motor stimulation at 2 Hz confirm proper needle location followed by injection of 1 cc of the dexamethasone and lidocaine solution and radiofrequency lesioning 80C 90 seconds. Then the lateral aspect of the S1-S2 and S3 foramen or targeted on the right side needle was placed just lateral to the foramen. Sensory stimulation showed elicited paresthesias. Followed by injection of 1 cc of the dexamethasone lidocaine solution and radiofrequency lesioning 80C 90 seconds. Patient tolerated procedure well Post procedure instructions given

## 2022-09-19 NOTE — Patient Instructions (Signed)
You had a radio frequency procedure today This was done to alleviate joint pain in your lumbar area We injected lidocaine which is a local anesthetic.  You may experience soreness at the injection sites. You may also experienced some irritation of the nerves that were heated I'm recommending ice for 30 minutes every 2 hours as needed for the next 24-48 hours   

## 2022-09-19 NOTE — Progress Notes (Signed)
  PROCEDURE RECORD [facility]   Name: Shannon French DOB:October 23, 1965 MRN: [id number]  Date:09/19/2022  Physician: Alysia Penna, MD    Nurse/CMA: Truman Hayward, CMA  Allergies:  Allergies  Allergen Reactions   Gabapentin [Gabapentin] Swelling    Suspects allergy was to dye; capsule was yellow   Actos [Pioglitazone Hydrochloride] Other (See Comments) and Hypertension    HEADACHES   Esomeprazole Magnesium Hives   Glucophage [Metformin Hydrochloride] Hives and Swelling   Iodinated Contrast Media     Other reaction(s): Other (See Comments) Unknown   Mepivacaine Hcl Hives and Itching    carbocaine Other reaction(s): Unknown   Other Other (See Comments)    Uncoded Allergy. Allergen: BICARBOCAINE XRAY DYE - THROAT SWELLS   Pioglitazone Other (See Comments)    Other reaction(s): irregular heart rate, Unknown   Sulfa Antibiotics Hives    Other reaction(s): Unknown    Consent Signed: Yes.    Is patient diabetic? Yes.    CBG today? 129  Pregnant: No. LMP: No LMP recorded. Patient has had a hysterectomy. (age 34-55)  Anticoagulants: no Anti-inflammatory: no Antibiotics: no  Procedure: Right Sacroiliac Radiofrequency  Position: Prone Start Time: 10:12 am  End Time: 10:33 am  Fluoro Time: 91  RN/CMA Truman Hayward, CMA Tabitha Tupper, CMA    Time 9:51 am 10:38 am    BP 119/84 137/90    Pulse 80 79    Respirations 16 16    O2 Sat 97 98    S/S 6 6    Pain Level 7/10 5/10     D/C home with Alroy Dust, patient A & O X 3, D/C instructions reviewed, and sits independently.

## 2022-10-10 ENCOUNTER — Encounter: Payer: Medicare Other | Attending: Physical Medicine & Rehabilitation | Admitting: Registered Nurse

## 2022-10-10 ENCOUNTER — Encounter: Payer: Self-pay | Admitting: Registered Nurse

## 2022-10-10 VITALS — BP 132/87 | HR 81 | Ht 63.0 in | Wt 117.6 lb

## 2022-10-10 DIAGNOSIS — Z79891 Long term (current) use of opiate analgesic: Secondary | ICD-10-CM

## 2022-10-10 DIAGNOSIS — G894 Chronic pain syndrome: Secondary | ICD-10-CM

## 2022-10-10 DIAGNOSIS — Z5181 Encounter for therapeutic drug level monitoring: Secondary | ICD-10-CM

## 2022-10-10 MED ORDER — HYDROCODONE-ACETAMINOPHEN 5-325 MG PO TABS: 1.0000 | ORAL_TABLET | Freq: Four times a day (QID) | ORAL | 0 refills | Status: DC | PRN

## 2022-10-10 NOTE — Progress Notes (Signed)
Subjective:    Patient ID: Shannon French, female    DOB: 19-Nov-1965, 57 y.o.   MRN: [id number]  HPI: Shannon French is a 57 y.o. female who returns for follow up appointment for chronic pain and medication refill. She states her  pain is located in her neck radiating into her neck radiating into her left shoulder, she also reports right shoulder pain. She also reports mid- lower back pain radiating into her bilateral buttocks and bilateral lower extremities. She rates her pain 7. Her current exercise regime is walking and performing stretching exercises.  Shannon French Morphine equivalent is 20.00 MME.   UDS ordered Today.     Pain Inventory Average Pain 7 Pain Right Now 7 My pain is constant, sharp, burning, dull, stabbing, tingling, and aching  In the last 24 hours, has pain interfered with the following? General activity 7 Relation with others 7 Enjoyment of life 7 What TIME of day is your pain at its worst? morning , daytime, evening, and night Sleep (in general) Fair  Pain is worse with: walking, bending, sitting, inactivity, standing, and some activites Pain improves with: rest, heat/ice, therapy/exercise, pacing activities, and medication Relief from Meds: 4  Family History  Problem Relation Age of Onset   Heart attack Mother    Cancer Father    Heart attack Brother    Social History   Socioeconomic History   Marital status: Significant Other    Spouse name: Not on file   Number of children: 1   Years of education: Not on file   Highest education level: Not on file  Occupational History   Occupation: Disabled  Tobacco Use   Smoking status: Former    Years: 10.00    Types: Cigarettes    Quit date: 10/14/2009    Years since quitting: 12.9   Smokeless tobacco: Never  Vaping Use   Vaping Use: Never used  Substance and Sexual Activity   Alcohol use: No   Drug use: No   Sexual activity: Not on file  Other Topics Concern   Not on file  Social History Narrative   Not  on file   Social Determinants of Health   Financial Resource Strain: Not on file  Food Insecurity: Not on file  Transportation Needs: Not on file  Physical Activity: Not on file  Stress: Not on file  Social Connections: Not on file   Past Surgical History:  Procedure Laterality Date   ABDOMINAL HYSTERECTOMY     ABDOMINAL SURGERY     APPENDECTOMY     CHOLECYSTECTOMY     CYST EXCISION     OVARY SURGERY     Past Surgical History:  Procedure Laterality Date   ABDOMINAL HYSTERECTOMY     ABDOMINAL SURGERY     APPENDECTOMY     CHOLECYSTECTOMY     CYST EXCISION     OVARY SURGERY     Past Medical History:  Diagnosis Date   Arm pain    left   Degenerative disc disease    Diabetes mellitus    Fibromyalgia    Hyperlipidemia    Hypertension    IBS (irritable bowel syndrome)    Osteoporosis    There were no vitals taken for this visit.  Opioid Risk Score:   Fall Risk Score:  `1  Depression screen [facility] 2/9     10/10/2022   11:07 AM 05/28/2022    8:09 AM 03/27/2022    8:34 AM 11/22/2021   10:08 AM 09/24/2021  9:43 AM 07/23/2021   10:24 AM 05/23/2021   10:21 AM  Depression screen PHQ 2/9  Decreased Interest 0 0 0 0 0 0 0  Down, Depressed, Hopeless 0 0 0 0 0 0 0  PHQ - 2 Score 0 0 0 0 0 0 0      Review of Systems  Constitutional: Negative.   HENT: Negative.    Eyes: Negative.   Respiratory: Negative.    Gastrointestinal: Negative.   Endocrine: Negative.   Genitourinary: Negative.   Musculoskeletal:  Positive for arthralgias, back pain and myalgias.  Skin: Negative.   Allergic/Immunologic: Negative.   Neurological: Negative.   Hematological: Negative.   Psychiatric/Behavioral: Negative.    All other systems reviewed and are negative.      Objective:   Physical Exam Vitals and nursing note reviewed.  Constitutional:      Appearance: Normal appearance.  Neck:     Comments: Cervical Paraspinal Tenderness: C-5-C-6 Cardiovascular:     Rate and Rhythm:  Normal rate and regular rhythm.     Pulses: Normal pulses.     Heart sounds: Normal heart sounds.  Pulmonary:     Effort: Pulmonary effort is normal.     Breath sounds: Normal breath sounds.  Musculoskeletal:     Cervical back: Normal range of motion and neck supple.     Comments: Normal Muscle Bulk and Muscle Testing Reveals:  Upper Extremities: Full ROM and Muscle Strength 5/5 Bilateral AC Joint Tenderness Thoracic Paraspinal Tenderness: T-7-T-9 Lumbar Paraspinal Tenderness: L-3-L-5 Left Greater Trochanter Tenderness Lower Extremities: Full ROM and Muscle Strength 5/5 Arises from Table with Ease Narrow Based Gait     Skin:    General: Skin is warm and dry.  Neurological:     Mental Status: She is alert and oriented to person, place, and time.  Psychiatric:        Mood and Affect: Mood normal.        Behavior: Behavior normal.         Assessment & Plan:  1. Bilateral sacroiliac joint dysfunction: S/P  on 09/19/2022 with good relief noted.Marland Kitchen 10/10/2022 R L4 medial branch, R L5 dorsal ramus radiofrequency ablation Right S1,S2,S3, neurolysis under fluoro guidance    S/P Left L4,L5,S1,2,3 Radiofrequency : on 08/08/2022 with good relief noted.           Continue slow weaning : Refilled: HYDROcodone 5/'325mg'$  one tablet every 6 hrs as needed # 100. Second script sent for the following month. We will continue the opioid monitoring program, this consists of regular clinic visits, examinations, urine drug screen, pill counts as well as use of [place] Controlled Substance Reporting system. A 12 month History has been reviewed on the [place] on 10/10/2022.  2. Chronic pain syndrome consistent with fibromyalgia. Continue with exercise regime, heat therapy,voltaren gel, lidocaine patches and topamax. Continue current analgesics . 10/10/2022 3. Lumbosacral Spondylosis/ Bilateral lumbar radiculopathy:Continue Elavil and Cymbalta. 10/10/2022,   4. Complex Regional Pain Syndrome: Continue Cymbalta. .Dr. Isaiah Blakes Following. 10/10/2022 5. Type 2 diabetes.: PCP Following. 10/10/2022 6. Muscle Spasms: Continue Tizanidine 4 mg one tablet every 8 hours as needed for muscle spasms. 10/10/2022 7. Insomnia: Continue Elavil. Continue to monitor. 10/10/2022 8.Cervical Radiculopathy:Continue Elavil: Continue to monitor. 10/10/2022. 9. Cervicalgia/ Cervical Inflammation: Continue current medication regime. Alternate with heat and Ice Therapy. 10/10/2022 10. Bilateral Thoracic Pain:Continue HEP as Tolerated.  Continue current medication regime. Continue to Monitor. 10/10/2022 11. Bilateral Shoulder Pain L>R: Continue with HEP as  Tolerated. Continue to Alternate Ice and Heat. Continue to monitor. Ortho Following. 10/10/2022  12. Bilateral Hand and Bilateral Feet Neuropathic Pain:  Continue current medication regimen. Continue HEP as tolerated. Continue to monitor. 10/10/2022 13. Polyarthralgia: Continue HEP as Tolerated. Continue to alternate Ice and Heat Therapy. Continue to monitor. 10/10/2022      F/U in 2 months

## 2022-10-15 LAB — TOXASSURE SELECT,+ANTIDEPR,UR

## 2022-10-23 ENCOUNTER — Telehealth: Payer: Self-pay | Admitting: *Deleted

## 2022-10-23 NOTE — Telephone Encounter (Signed)
Urine drug screen for this encounter is consistent for prescribed medication 

## 2022-10-30 ENCOUNTER — Other Ambulatory Visit: Payer: Self-pay | Admitting: Cardiovascular Disease

## 2022-11-29 ENCOUNTER — Telehealth: Payer: Self-pay

## 2022-11-29 NOTE — Telephone Encounter (Signed)
Patient requesting refill on Hydrocodone said she just realized she is down to her last 4

## 2022-12-17 ENCOUNTER — Encounter: Payer: Medicare Other | Attending: Physical Medicine & Rehabilitation | Admitting: Registered Nurse

## 2022-12-17 VITALS — BP 123/83 | HR 96 | Ht 63.0 in | Wt 129.0 lb

## 2022-12-17 DIAGNOSIS — M25562 Pain in left knee: Secondary | ICD-10-CM

## 2022-12-17 DIAGNOSIS — M25511 Pain in right shoulder: Secondary | ICD-10-CM | POA: Insufficient documentation

## 2022-12-17 DIAGNOSIS — W19XXXD Unspecified fall, subsequent encounter: Secondary | ICD-10-CM

## 2022-12-17 DIAGNOSIS — M2550 Pain in unspecified joint: Secondary | ICD-10-CM | POA: Diagnosis not present

## 2022-12-17 DIAGNOSIS — Z5181 Encounter for therapeutic drug level monitoring: Secondary | ICD-10-CM

## 2022-12-17 DIAGNOSIS — W19XXXA Unspecified fall, initial encounter: Secondary | ICD-10-CM | POA: Diagnosis not present

## 2022-12-17 DIAGNOSIS — E119 Type 2 diabetes mellitus without complications: Secondary | ICD-10-CM | POA: Diagnosis not present

## 2022-12-17 DIAGNOSIS — M542 Cervicalgia: Secondary | ICD-10-CM | POA: Insufficient documentation

## 2022-12-17 DIAGNOSIS — F32A Depression, unspecified: Secondary | ICD-10-CM | POA: Diagnosis not present

## 2022-12-17 DIAGNOSIS — Z79891 Long term (current) use of opiate analgesic: Secondary | ICD-10-CM | POA: Diagnosis not present

## 2022-12-17 DIAGNOSIS — G894 Chronic pain syndrome: Secondary | ICD-10-CM | POA: Diagnosis not present

## 2022-12-17 DIAGNOSIS — M546 Pain in thoracic spine: Secondary | ICD-10-CM | POA: Insufficient documentation

## 2022-12-17 DIAGNOSIS — M4727 Other spondylosis with radiculopathy, lumbosacral region: Secondary | ICD-10-CM | POA: Insufficient documentation

## 2022-12-17 DIAGNOSIS — Y92009 Unspecified place in unspecified non-institutional (private) residence as the place of occurrence of the external cause: Secondary | ICD-10-CM | POA: Diagnosis not present

## 2022-12-17 DIAGNOSIS — G8929 Other chronic pain: Secondary | ICD-10-CM

## 2022-12-17 MED ORDER — HYDROCODONE-ACETAMINOPHEN 5-325 MG PO TABS: 1.0000 | ORAL_TABLET | Freq: Four times a day (QID) | ORAL | 0 refills | Status: DC | PRN

## 2022-12-17 NOTE — Progress Notes (Unsigned)
Subjective:    Patient ID: Shannon French, female    DOB: [birth year], 57 y.o.   MRN: [id number]  HPI: Shannon French is a 57 y.o. female who returns for follow up appointment for chronic pain and medication refill. states *** pain is located in  ***. rates pain ***. current exercise regime is walking and performing stretching exercises.  [name] equivalent is *** MME.   Last UDS was Performed on 10/10/2022, it was consistent.     Pain Inventory Average Pain 7 Pain Right Now 7 My pain is constant, sharp, burning, dull, stabbing, tingling, and aching  In the last 24 hours, has pain interfered with the following? General activity 10 Relation with others 9 Enjoyment of life 1 What TIME of day is your pain at its worst? morning , daytime, evening, and night Sleep (in general) Poor  Pain is worse with: walking, bending, sitting, inactivity, and standing Pain improves with: rest, heat/ice, therapy/exercise, pacing activities, and medication Relief from Meds: 7  Family History  Problem Relation Age of Onset   Heart attack Mother    Cancer Father    Heart attack Brother    Social History   Socioeconomic History   Marital status: Significant Other    Spouse name: Not on file   Number of children: 1   Years of education: Not on file   Highest education level: Not on file  Occupational History   Occupation: Disabled  Tobacco Use   Smoking status: Former    Years: 10    Types: Cigarettes    Quit date: 10/14/2009    Years since quitting: 13.1   Smokeless tobacco: Never  Vaping Use   Vaping Use: Never used  Substance and Sexual Activity   Alcohol use: No   Drug use: No   Sexual activity: Not on file  Other Topics Concern   Not on file  Social History Narrative   Not on file   Social Determinants of Health   Financial Resource Strain: Not on file  Food Insecurity: Not on file  Transportation Needs: Not on file  Physical Activity: Not on file  Stress: Not on  file  Social Connections: Not on file   Past Surgical History:  Procedure Laterality Date   ABDOMINAL HYSTERECTOMY     ABDOMINAL SURGERY     APPENDECTOMY     CHOLECYSTECTOMY     CYST EXCISION     OVARY SURGERY     Past Surgical History:  Procedure Laterality Date   ABDOMINAL HYSTERECTOMY     ABDOMINAL SURGERY     APPENDECTOMY     CHOLECYSTECTOMY     CYST EXCISION     OVARY SURGERY     Past Medical History:  Diagnosis Date   Arm pain    left   Degenerative disc disease    Diabetes mellitus    Fibromyalgia    Hyperlipidemia    Hypertension    IBS (irritable bowel syndrome)    Osteoporosis    BP 123/83   Pulse 96   Ht 5\' 3"  (1.6 m)   Wt 129 lb (58.5 kg)   SpO2 98%   BMI 22.85 kg/m   Opioid Risk Score:   Fall Risk Score:  `1  Depression screen [facility] 2/9     12/17/2022    9:24 AM 10/10/2022   11:07 AM 05/28/2022    8:09 AM 03/27/2022    8:34 AM 11/22/2021   10:08 AM 09/24/2021    9:43  AM 07/23/2021   10:24 AM  Depression screen PHQ 2/9  Decreased Interest 0 0 0 0 0 0 0  Down, Depressed, Hopeless 0 0 0 0 0 0 0  PHQ - 2 Score 0 0 0 0 0 0 0     Review of Systems  Musculoskeletal:  Positive for back pain.       LT leg  knee Rt trigger finger B/L shoulder pain      Objective:   Physical Exam        Assessment & Plan:  1. Bilateral sacroiliac joint dysfunction: S/P  on 09/19/2022 with good relief noted.Marland Kitchen 10/10/2022 R L4 medial branch, R L5 dorsal ramus radiofrequency ablation Right S1,S2,S3, neurolysis under fluoro guidance    S/P Left L4,L5,S1,2,3 Radiofrequency : on 08/08/2022 with good relief noted.           Continue slow weaning : Refilled: HYDROcodone 5/325mg  one tablet every 6 hrs as needed # 100. Second script sent for the following month. We will continue the opioid monitoring program, this consists of regular clinic visits, examinations, urine drug screen, pill counts as well as use of [place] Controlled Substance Reporting system. A 12  month History has been reviewed on the [place] Controlled Substance Reporting System on 10/10/2022.  2. Chronic pain syndrome consistent with fibromyalgia. Continue with exercise regime, heat therapy,voltaren gel, lidocaine patches and topamax. Continue current analgesics . 10/10/2022 3. Lumbosacral Spondylosis/ Bilateral lumbar radiculopathy:Continue Elavil and Cymbalta. 10/10/2022,  4. Complex Regional Pain Syndrome: Continue Cymbalta. .Dr. Yolanda Bonine Following. 10/10/2022 5. Type 2 diabetes.: PCP Following. 10/10/2022 6. Muscle Spasms: Continue Tizanidine 4 mg one tablet every 8 hours as needed for muscle spasms. 10/10/2022 7. Insomnia: Continue Elavil. Continue to monitor. 10/10/2022 8.Cervical Radiculopathy:Continue Elavil: Continue to monitor. 10/10/2022. 9. Cervicalgia/ Cervical Inflammation: Continue current medication regime. Alternate with heat and Ice Therapy. 10/10/2022 10. Bilateral Thoracic Pain:Continue HEP as Tolerated.  Continue current medication regime. Continue to Monitor. 10/10/2022 11. Bilateral Shoulder Pain L>R: Continue with HEP as Tolerated. Continue to Alternate Ice and Heat. Continue to monitor. Ortho Following. 10/10/2022  12. Bilateral Hand and Bilateral Feet Neuropathic Pain:  Continue current medication regimen. Continue HEP as tolerated. Continue to monitor. 10/10/2022 13. Polyarthralgia: Continue HEP as Tolerated. Continue to alternate Ice and Heat Therapy. Continue to monitor. 10/10/2022       F/U in 2 months

## 2022-12-18 ENCOUNTER — Encounter: Payer: Self-pay | Admitting: Registered Nurse

## 2022-12-25 NOTE — Telephone Encounter (Signed)
Last fill per pmp  Filled  Written  ID  Drug  QTY  Days  Prescriber  RX #  Dispenser  Refill  Daily Dose*  Pymt Type  PMP  12/02/2022 10/10/2022 1  Hydrocodone-Acetamin 5-325 Mg 100.00 25 Eu Tho [id number] Nor ([id number]) 0/0 20.00 MME Medicare [place]

## 2023-01-19 NOTE — Progress Notes (Unsigned)
Cardiology Office Note:   Date:  01/21/2023  NAME:  Shannon French    MRN: [id number] DOB:  Nov 01, 1965   PCP:  Nonnie Done., MD  Cardiologist:  None  Electrophysiologist:  None   Referring MD: Nonnie Done., MD   Chief Complaint  Patient presents with   Follow-up        History of Present Illness:   Shannon French is a 57 y.o. female with a hx of minimal CAD, DM, HLD who presents for follow-up.  We discussed results of her coronary CTA.  Minimal nonobstructive CAD.  She has no coronary calcium.  She is on Zetia.  She is statin intolerant.  She is diabetic.  We discussed that her LDL should be close to 70 as possible.  She will work on this.  She is working on her A1c.  She reports no further chest pains or trouble breathing.  Suspect this was anxiety and/or stress related.  Echo was normal.  We discussed a good preventive approach.  She is working on diet.  She is going to start exercising once her arthritic issues are resolved.  Problem List Non-obstructive CAD -CAC = 0  -<25% LAD/RCA 2. DM -A1c 6.8 3. HTN 4. HLD -statin intolerant  -T chol 212, HDL 61, LDL 119, TG 152 5. RA  Past Medical History: Past Medical History:  Diagnosis Date   Arm pain    left   Degenerative disc disease    Diabetes mellitus    Fibromyalgia    Hyperlipidemia    Hypertension    IBS (irritable bowel syndrome)    Osteoporosis     Past Surgical History: Past Surgical History:  Procedure Laterality Date   ABDOMINAL HYSTERECTOMY     ABDOMINAL SURGERY     APPENDECTOMY     CHOLECYSTECTOMY     CYST EXCISION     OVARY SURGERY      Current Medications: Current Meds  Medication Sig   ALBUTEROL IN albuterol 90 mcg-budesonide 80 mcg/actuation HFA aerosol inhaler  Inhale by inhalation route.   amitriptyline (ELAVIL) 50 MG tablet TAKE 1 TABLET BY MOUTH EVERYDAY AT BEDTIME   carbamazepine (TEGRETOL) 200 MG tablet TAKE 1 TABLET BY MOUTH TWICE A DAY   clobetasol ointment (TEMOVATE) 0.05 %  Apply 1 application topically 2 (two) times daily.   Continuous Blood Gluc Receiver (FREESTYLE LIBRE 2 READER) DEVI SCAN AS NEEDED FOR CONTINUOUS GLUCOSE MONITORING   DHEA 25 MG CAPS Take 25 mg by mouth daily.   diclofenac (VOLTAREN) 75 MG EC tablet TAKE 1 TABLET BY MOUTH TWICE A DAY (Patient taking differently: 75 mg 1 day or 1 dose. One tablet daily)   diclofenac sodium (VOLTAREN) 1 % GEL Apply 2 g topically 4 (four) times daily.   DULoxetine (CYMBALTA) 30 MG capsule TAKE 1 CAPSULE BY MOUTH EVERY DAY   DULoxetine (CYMBALTA) 60 MG capsule Take 1 capsule (60 mg total) by mouth daily.   ezetimibe (ZETIA) 10 MG tablet TAKE 1 TABLET BY MOUTH EVERY DAY   folic acid (FOLVITE) 1 MG tablet Take 1 mg by mouth daily.   GLUCAGON HCL IJ Glucagon (HCl) Emergency Kit   HYDROcodone-acetaminophen (NORCO/VICODIN) 5-325 MG tablet Take 1 tablet by mouth every 6 (six) hours as needed for moderate pain. Do Not Fill Before 01/25/2023   insulin lispro (HUMALOG) 100 UNIT/ML injection Inject into the skin as needed.   LANTUS SOLOSTAR 100 UNIT/ML Solostar Pen Inject 20 Units into the skin every morning.   lidocaine (  LIDODERM) 5 % Place 1 patch onto the skin daily. Remove & Discard patch within 12 hours or as directed by MD   lisinopril (ZESTRIL) 2.5 MG tablet TAKE 1 TABLET BY MOUTH EVERY DAY   methimazole (TAPAZOLE) 5 MG tablet Take 5 mg by mouth daily.   naloxone (NARCAN) nasal spray 4 mg/0.1 mL Lethargic or respiratory depression after opoid use   ONE TOUCH ULTRA TEST test strip    pantoprazole (PROTONIX) 40 MG tablet Take 40 mg by mouth daily.    PROAIR HFA 108 (90 BASE) MCG/ACT inhaler Inhale 2 puffs into the lungs 4 (four) times daily as needed.   Probiotic Product (PROBIOTIC-10 PO) Probiotic   promethazine (PHENERGAN) 25 MG tablet Take 25 mg by mouth every 6 (six) hours as needed.   risedronate (ACTONEL) 150 MG tablet Take 150 mg by mouth daily. with water on empty stomach, nothing by mouth or lie down for next  30 minutes.   Semaglutide,0.25 or 0.5MG /DOS, (OZEMPIC, 0.25 OR 0.5 MG/DOSE,) 2 MG/1.5ML SOPN Inject 0.25 mg into the skin. [place] weekly   tiZANidine (ZANAFLEX) 4 MG tablet TAKE 1 TABLET (4 MG TOTAL) BY MOUTH EVERY 8 (EIGHT) HOURS AS NEEDED FOR MUSCLE SPASMS   valACYclovir (VALTREX) 1000 MG tablet      Allergies:    Blue dyes (parenteral), Gabapentin [gabapentin], Actos [pioglitazone hydrochloride], Bee venom, Esomeprazole magnesium, Glucophage [metformin hydrochloride], Iodinated contrast media, Iohexol, Mepivacaine hcl, Morphine, Other, Pioglitazone, and Sulfa antibiotics   Social History: Social History   Socioeconomic History   Marital status: Significant Other    Spouse name: Not on file   Number of children: 1   Years of education: Not on file   Highest education level: Not on file  Occupational History   Occupation: Disabled  Tobacco Use   Smoking status: Former    Years: 10    Types: Cigarettes    Quit date: 10/14/2009    Years since quitting: 13.2   Smokeless tobacco: Never  Vaping Use   Vaping Use: Never used  Substance and Sexual Activity   Alcohol use: No   Drug use: No   Sexual activity: Not on file  Other Topics Concern   Not on file  Social History Narrative   Not on file   Social Determinants of Health   Financial Resource Strain: Not on file  Food Insecurity: Not on file  Transportation Needs: Not on file  Physical Activity: Not on file  Stress: Not on file  Social Connections: Not on file     Family History: The patient's family history includes Cancer in her father; Heart attack in her brother and mother.  ROS:   All other ROS reviewed and negative. Pertinent positives noted in the HPI.     EKGs/Labs/Other Studies Reviewed:   The following studies were personally reviewed by me today:  EKG:  EKG is ordered today.    EKG Interpretation  Date/Time:  Tuesday January 21 2023 09:51:04 EDT Ventricular Rate:  85 PR Interval:  130 QRS  Duration: 84 QT Interval:  370 QTC Calculation: 440 R Axis:   134 Text Interpretation: Normal sinus rhythm Right axis deviation Nonspecific ST abnormality Confirmed by Lennie Odor [phone]) on 01/21/2023 10:04:32 AM   Recent Labs: No results found for requested labs within last 365 days.   Recent Lipid Panel No results found for: "CHOL", "TRIG", "HDL", "CHOLHDL", "VLDL", "LDLCALC", "LDLDIRECT"  Physical Exam:   VS:  BP 134/82   Pulse 87   Ht 5'  3" (1.6 m)   Wt 128 lb (58.1 kg)   SpO2 99%   BMI 22.67 kg/m    Wt Readings from Last 3 Encounters:  01/21/23 128 lb (58.1 kg)  12/17/22 129 lb (58.5 kg)  10/10/22 117 lb 9.6 oz (53.3 kg)    General: Well nourished, well developed, in no acute distress Head: Atraumatic, normal size  Eyes: PEERLA, EOMI  Neck: Supple, no JVD Endocrine: No thryomegaly Cardiac: Normal S1, S2; RRR; no murmurs, rubs, or gallops Lungs: Clear to auscultation bilaterally, no wheezing, rhonchi or rales  Abd: Soft, nontender, no hepatomegaly  Ext: No edema, pulses 2+ Musculoskeletal: No deformities, BUE and BLE strength normal and equal Skin: Warm and dry, no rashes   Neuro: Alert and oriented to person, place, time, and situation, CNII-XII grossly intact, no focal deficits  Psych: Normal mood and affect   ASSESSMENT:   Shannon French is a 57 y.o. female who presents for the following: 1. Coronary artery disease involving native coronary artery of native heart without angina pectoris   2. Mixed hyperlipidemia     PLAN:   1. Coronary artery disease involving native coronary artery of native heart without angina pectoris 2. Mixed hyperlipidemia -Minimal CAD, less than 25%.  Calcium score is 0.  She is statin intolerant.  Her she is diabetic.  Her LDL should be less than 70.  She is on Zetia 10 mg daily.  She will have her repeat lipids done by her endocrinologist.  We discussed her goal today including preventive lifestyle.  She will work on diet and  exercise.  She will work on her diabetes.  We did discuss that the approach is largely preventative.  She will see [place] yearly.   Disposition: Return in about 1 year (around 01/21/2024).  Medication Adjustments/Labs and Tests Ordered: Current medicines are reviewed at length with the patient today.  Concerns regarding medicines are outlined above.  No orders of the defined types were placed in this encounter.  No orders of the defined types were placed in this encounter.   Patient Instructions  Medication Instructions:  The current medical regimen is effective;  continue present plan and medications.  *If you need a refill on your cardiac medications before your next appointment, please call your pharmacy*   Lab Work: Have labs sent to [place] from primary care provider.  If you have labs (blood work) drawn today and your tests are completely normal, you will receive your results only by: MyChart Message (if you have MyChart) OR A paper copy in the mail If you have any lab test that is abnormal or we need to change your treatment, we will call you to review the results.   Follow-Up: At [facility], you and your health needs are our priority.  As part of our continuing mission to provide you with exceptional heart care, we have created designated Provider Care Teams.  These Care Teams include your primary Cardiologist (physician) and Advanced Practice Providers (APPs -  Physician Assistants and Nurse Practitioners) who all work together to provide you with the care you need, when you need it.  We recommend signing up for the patient portal called "MyChart".  Sign up information is provided on this After Visit Summary.  MyChart is used to connect with patients for Virtual Visits (Telemedicine).  Patients are able to view lab/test results, encounter notes, upcoming appointments, etc.  Non-urgent messages can be sent to your provider as well.   To learn more about  what you can do with  MyChart, go to [email or web address].    Your next appointment:   12 month(s)  Provider:   Marjie Skiff, PA-C, or Azalee Course, PA-C       Time Spent with Patient: I have spent a total of 25 minutes with patient reviewing hospital notes, telemetry, EKGs, labs and examining the patient as well as establishing an assessment and plan that was discussed with the patient.  > 50% of time was spent in direct patient care.  Signed, Lenna Gilford. Flora Lipps, MD, [facility]  [facility]  [address], [address] [place], [place] [address] [phone]  01/21/2023 10:08 AM

## 2023-01-21 ENCOUNTER — Encounter: Payer: Self-pay | Admitting: Cardiovascular Disease

## 2023-01-21 ENCOUNTER — Ambulatory Visit: Payer: Medicare Other | Attending: Cardiovascular Disease | Admitting: Cardiovascular Disease

## 2023-01-21 ENCOUNTER — Other Ambulatory Visit: Payer: Self-pay

## 2023-01-21 VITALS — BP 134/82 | HR 87 | Ht 63.0 in | Wt 128.0 lb

## 2023-01-21 DIAGNOSIS — E782 Mixed hyperlipidemia: Secondary | ICD-10-CM

## 2023-01-21 DIAGNOSIS — I2510 Atherosclerotic heart disease of native coronary artery without angina pectoris: Secondary | ICD-10-CM

## 2023-01-21 NOTE — Patient Instructions (Signed)
Medication Instructions:  The current medical regimen is effective;  continue present plan and medications.  *If you need a refill on your cardiac medications before your next appointment, please call your pharmacy*   Lab Work: Have labs sent to [place] from primary care provider.  If you have labs (blood work) drawn today and your tests are completely normal, you will receive your results only by: MyChart Message (if you have MyChart) OR A paper copy in the mail If you have any lab test that is abnormal or we need to change your treatment, we will call you to review the results.   Follow-Up: At [facility], you and your health needs are our priority.  As part of our continuing mission to provide you with exceptional heart care, we have created designated Provider Care Teams.  These Care Teams include your primary Cardiologist (physician) and Advanced Practice Providers (APPs -  Physician Assistants and Nurse Practitioners) who all work together to provide you with the care you need, when you need it.  We recommend signing up for the patient portal called "MyChart".  Sign up information is provided on this After Visit Summary.  MyChart is used to connect with patients for Virtual Visits (Telemedicine).  Patients are able to view lab/test results, encounter notes, upcoming appointments, etc.  Non-urgent messages can be sent to your provider as well.   To learn more about what you can do with MyChart, go to [email or web address].    Your next appointment:   12 month(s)  Provider:   Marjie Skiff, PA-C, or Azalee Course, PA-C

## 2023-01-26 ENCOUNTER — Other Ambulatory Visit: Payer: Self-pay | Admitting: Cardiovascular Disease

## 2023-02-17 ENCOUNTER — Other Ambulatory Visit: Payer: Self-pay | Admitting: Registered Nurse

## 2023-02-17 DIAGNOSIS — M797 Fibromyalgia: Secondary | ICD-10-CM

## 2023-02-17 DIAGNOSIS — M47817 Spondylosis without myelopathy or radiculopathy, lumbosacral region: Secondary | ICD-10-CM

## 2023-02-24 ENCOUNTER — Encounter: Payer: Self-pay | Admitting: Registered Nurse

## 2023-02-24 ENCOUNTER — Encounter: Payer: Medicare Other | Attending: Physical Medicine & Rehabilitation | Admitting: Registered Nurse

## 2023-02-24 VITALS — BP 114/83 | HR 97 | Ht 63.0 in | Wt 132.0 lb

## 2023-02-24 DIAGNOSIS — Z5181 Encounter for therapeutic drug level monitoring: Secondary | ICD-10-CM | POA: Diagnosis present

## 2023-02-24 DIAGNOSIS — M2550 Pain in unspecified joint: Secondary | ICD-10-CM | POA: Insufficient documentation

## 2023-02-24 DIAGNOSIS — M542 Cervicalgia: Secondary | ICD-10-CM | POA: Diagnosis present

## 2023-02-24 DIAGNOSIS — G8929 Other chronic pain: Secondary | ICD-10-CM | POA: Diagnosis present

## 2023-02-24 DIAGNOSIS — M25512 Pain in left shoulder: Secondary | ICD-10-CM | POA: Diagnosis present

## 2023-02-24 DIAGNOSIS — Z79891 Long term (current) use of opiate analgesic: Secondary | ICD-10-CM | POA: Diagnosis present

## 2023-02-24 DIAGNOSIS — M47817 Spondylosis without myelopathy or radiculopathy, lumbosacral region: Secondary | ICD-10-CM | POA: Diagnosis present

## 2023-02-24 DIAGNOSIS — M797 Fibromyalgia: Secondary | ICD-10-CM | POA: Diagnosis present

## 2023-02-24 DIAGNOSIS — M546 Pain in thoracic spine: Secondary | ICD-10-CM | POA: Insufficient documentation

## 2023-02-24 DIAGNOSIS — G894 Chronic pain syndrome: Secondary | ICD-10-CM | POA: Diagnosis present

## 2023-02-24 DIAGNOSIS — M25511 Pain in right shoulder: Secondary | ICD-10-CM | POA: Insufficient documentation

## 2023-02-24 MED ORDER — HYDROCODONE-ACETAMINOPHEN 5-325 MG PO TABS: 1.0000 | ORAL_TABLET | Freq: Three times a day (TID) | ORAL | 0 refills | Status: DC | PRN

## 2023-02-24 MED ORDER — TIZANIDINE HCL 4 MG PO TABS
4.0000 mg | ORAL_TABLET | Freq: Three times a day (TID) | ORAL | 2 refills | Status: DC | PRN
Start: 2023-02-24 — End: 2023-11-28

## 2023-02-24 NOTE — Progress Notes (Unsigned)
Subjective:    Patient ID: Shannon French, female    DOB: Dec 11, 1965, 57 y.o.   MRN: [id number]  HPI: Shannon French is a 57 y.o. female who returns for follow up appointment for chronic pain and medication refill. states *** pain is located in  ***. rates pain ***. current exercise regime is walking and performing stretching exercises.  Shannon French Morphine equivalent is *** MME.   Last UDS was Performed on 10/10/2022, it was consistent.      Pain Inventory Average Pain 3 Pain Right Now 3 My pain is constant, sharp, burning, dull, stabbing, tingling, and aching  In the last 24 hours, has pain interfered with the following? General activity 3 Relation with others 3 Enjoyment of life 1 What TIME of day is your pain at its worst? morning , daytime, evening, and night Sleep (in general) Fair  Pain is worse with: walking, bending, sitting, inactivity, standing, and some activites Pain improves with: rest, heat/ice, therapy/exercise, and medication Relief from Meds: 3  Family History  Problem Relation Age of Onset   Heart attack Mother    Cancer Father    Heart attack Brother    Social History   Socioeconomic History   Marital status: Significant Other    Spouse name: Not on file   Number of children: 1   Years of education: Not on file   Highest education level: Not on file  Occupational History   Occupation: Disabled  Tobacco Use   Smoking status: Former    Current packs/day: 0.00    Types: Cigarettes    Start date: 10/15/1999    Quit date: 10/14/2009    Years since quitting: 13.3   Smokeless tobacco: Never  Vaping Use   Vaping status: Never Used  Substance and Sexual Activity   Alcohol use: No   Drug use: No   Sexual activity: Not on file  Other Topics Concern   Not on file  Social History Narrative   Not on file   Social Determinants of Health   Financial Resource Strain: Not on file  Food Insecurity: Not on file  Transportation Needs: Not on file  Physical  Activity: Not on file  Stress: Not on file  Social Connections: Not on file   Past Surgical History:  Procedure Laterality Date   ABDOMINAL HYSTERECTOMY     ABDOMINAL SURGERY     APPENDECTOMY     CHOLECYSTECTOMY     CYST EXCISION     OVARY SURGERY     Past Surgical History:  Procedure Laterality Date   ABDOMINAL HYSTERECTOMY     ABDOMINAL SURGERY     APPENDECTOMY     CHOLECYSTECTOMY     CYST EXCISION     OVARY SURGERY     Past Medical History:  Diagnosis Date   Arm pain    left   Degenerative disc disease    Diabetes mellitus    Fibromyalgia    Hyperlipidemia    Hypertension    IBS (irritable bowel syndrome)    Osteoporosis    BP 114/83   Pulse 97   Ht 5\' 3"  (1.6 m)   Wt 132 lb (59.9 kg)   SpO2 98%   BMI 23.38 kg/m   Opioid Risk Score:   Fall Risk Score:  `1  Depression screen [facility] 2/9     02/24/2023    3:06 PM 12/17/2022    9:24 AM 10/10/2022   11:07 AM 05/28/2022    8:09 AM 03/27/2022  8:34 AM 11/22/2021   10:08 AM 09/24/2021    9:43 AM  Depression screen PHQ 2/9  Decreased Interest 0 0 0 0 0 0 0  Down, Depressed, Hopeless 0 0 0 0 0 0 0  PHQ - 2 Score 0 0 0 0 0 0 0    Review of Systems  Musculoskeletal:  Positive for back pain and neck pain.       Left upper leg pain, in the left shoulder & right hand / wrist  All other systems reviewed and are negative.      Objective:   Physical Exam        Assessment & Plan:  1. Bilateral sacroiliac joint dysfunction: S/P  on 09/19/2022 with good relief noted.Marland Kitchen 12/17/2022 R L4 medial branch, R L5 dorsal ramus radiofrequency ablation Right S1,S2,S3, neurolysis under fluoro guidance    S/P Left L4,L5,S1,2,3 Radiofrequency : on 08/08/2022 with good relief noted.           Continue slow weaning : Refilled: HYDROcodone 5/325mg  one tablet every 6 hrs as needed # 100. Second script sent for the following month. We will continue the opioid monitoring program, this consists of regular clinic visits,  examinations, urine drug screen, pill counts as well as use of [place] Controlled Substance Reporting system. A 12 month History has been reviewed on the [place] Controlled Substance Reporting System on 12/17/2022.  2. Chronic pain syndrome consistent with fibromyalgia. Continue with exercise regime, heat therapy,voltaren gel, lidocaine patches and topamax. Continue current analgesics . 12/17/2022 3. Lumbosacral Spondylosis/ Bilateral lumbar radiculopathy:Continue Elavil and Cymbalta. 12/17/2022,  4. Complex Regional Pain Syndrome: Continue Cymbalta. .Dr. Yolanda Bonine Following. 12/17/2022 5. Type 2 diabetes.: PCP Following. 12/17/2022 6. Muscle Spasms: Continue Tizanidine 4 mg one tablet every 8 hours as needed for muscle spasms. 12/17/2022 7. Insomnia: Continue Elavil. Continue to monitor. 12/17/2022 8.Cervical Radiculopathy:Continue Elavil: Continue to monitor. 12/17/2022. 9. Cervicalgia/ Cervical Inflammation: Continue current medication regime. Alternate with heat and Ice Therapy. 12/17/2022 10. Bilateral Thoracic Pain:Continue HEP as Tolerated.  Continue current medication regime. Continue to Monitor. 12/17/2022 11. Bilateral Shoulder Pain L>R: Continue with HEP as Tolerated. Continue to Alternate Ice and Heat. Continue to monitor. Ortho Following. 12/17/2022  12. Bilateral Hand and Bilateral Feet Neuropathic Pain:  Continue current medication regimen. Continue HEP as tolerated. Continue to monitor. 12/17/2022 13. Polyarthralgia: Continue HEP as Tolerated. Continue to alternate Ice and Heat Therapy. Continue to monitor. 12/17/2022 14. Fall at Home: Educated on [employer].  15. Left Knee Pain: Ortho Following. Continue to Monitor.    F/U in 2 months

## 2023-03-03 ENCOUNTER — Other Ambulatory Visit: Payer: Self-pay | Admitting: Physical Medicine & Rehabilitation

## 2023-03-03 DIAGNOSIS — G90512 Complex regional pain syndrome I of left upper limb: Secondary | ICD-10-CM

## 2023-03-03 DIAGNOSIS — G894 Chronic pain syndrome: Secondary | ICD-10-CM

## 2023-03-20 ENCOUNTER — Telehealth: Payer: Self-pay | Admitting: *Deleted

## 2023-03-20 NOTE — Telephone Encounter (Signed)
[facility] back stating Hydrocodone/Acetamin 5-325 mg still on backorder.  They ask if you would please consider changing to  10-325 mg.

## 2023-03-21 MED ORDER — HYDROCODONE-ACETAMINOPHEN 10-325 MG PO TABS: ORAL_TABLET | ORAL | 0 refills | Status: DC

## 2023-03-21 NOTE — Addendum Note (Signed)
Addended by: Jones Bales on: 03/21/2023 10:44 AM   Modules accepted: Orders

## 2023-03-21 NOTE — Telephone Encounter (Signed)
Patient called back and left message [facility] [place] only has 10-325. She says she can cut the pills in half.

## 2023-03-21 NOTE — Telephone Encounter (Signed)
Call placed to Shannon French no answer left message to return the call.  Call

## 2023-03-21 NOTE — Telephone Encounter (Signed)
PMP was Reviewed.  [facility] was [facility], no answer Call placed to Ms. K[place], she returned the call. Hydrocodone e-scribed to pharmacy, she verbalizes understanding.  Hydrocodone 5/325 mg discontinued due to backorder

## 2023-04-12 ENCOUNTER — Other Ambulatory Visit: Payer: Self-pay | Admitting: Registered Nurse

## 2023-04-24 ENCOUNTER — Telehealth: Payer: Self-pay | Admitting: *Deleted

## 2023-04-24 MED ORDER — HYDROCODONE-ACETAMINOPHEN 10-325 MG PO TABS: ORAL_TABLET | ORAL | 0 refills | Status: DC

## 2023-04-24 NOTE — Telephone Encounter (Signed)
PMP was Reviewed.  Hydrocodone e-scribed to pharmacy.  Ms. Swor is aware via My-Chart

## 2023-04-24 NOTE — Telephone Encounter (Signed)
Mrs Sturdevant called to report that [facility] is still out of the 5/325 and will need the 10/325 #45 sent into the pharmacy in its place.

## 2023-04-28 ENCOUNTER — Encounter: Payer: Medicare Other | Attending: Physical Medicine & Rehabilitation | Admitting: Registered Nurse

## 2023-04-28 ENCOUNTER — Encounter: Payer: Self-pay | Admitting: Registered Nurse

## 2023-04-28 VITALS — BP 133/82 | HR 77 | Ht 63.0 in | Wt 131.0 lb

## 2023-04-28 DIAGNOSIS — Z79891 Long term (current) use of opiate analgesic: Secondary | ICD-10-CM

## 2023-04-28 DIAGNOSIS — G90512 Complex regional pain syndrome I of left upper limb: Secondary | ICD-10-CM | POA: Diagnosis present

## 2023-04-28 DIAGNOSIS — G894 Chronic pain syndrome: Secondary | ICD-10-CM

## 2023-04-28 DIAGNOSIS — M2550 Pain in unspecified joint: Secondary | ICD-10-CM | POA: Diagnosis present

## 2023-04-28 DIAGNOSIS — Z5181 Encounter for therapeutic drug level monitoring: Secondary | ICD-10-CM

## 2023-04-28 DIAGNOSIS — M47817 Spondylosis without myelopathy or radiculopathy, lumbosacral region: Secondary | ICD-10-CM

## 2023-04-28 DIAGNOSIS — M546 Pain in thoracic spine: Secondary | ICD-10-CM

## 2023-04-28 DIAGNOSIS — Z79899 Other long term (current) drug therapy: Secondary | ICD-10-CM | POA: Diagnosis present

## 2023-04-28 DIAGNOSIS — M25512 Pain in left shoulder: Secondary | ICD-10-CM | POA: Diagnosis present

## 2023-04-28 DIAGNOSIS — M62830 Muscle spasm of back: Secondary | ICD-10-CM

## 2023-04-28 DIAGNOSIS — M542 Cervicalgia: Secondary | ICD-10-CM

## 2023-04-28 DIAGNOSIS — G8929 Other chronic pain: Secondary | ICD-10-CM | POA: Diagnosis present

## 2023-04-28 MED ORDER — HYDROCODONE-ACETAMINOPHEN 10-325 MG PO TABS: ORAL_TABLET | ORAL | 0 refills | Status: DC

## 2023-04-28 MED ORDER — AMITRIPTYLINE HCL 50 MG PO TABS
50.0000 mg | ORAL_TABLET | Freq: Every day | ORAL | 2 refills | Status: DC
Start: 2023-04-28 — End: 2023-12-31

## 2023-04-28 NOTE — Progress Notes (Signed)
Subjective:    Patient ID: Shannon French, female    DOB: [birth year], 57 y.o.   MRN: [id number]  HPI: Shannon French is a 57 y.o. female who returns for follow up appointment for chronic pain and medication refill. She states her pain is located in her neck, mid- lower back and left knee pain. She also reports generalized joint pain. She  rates her pain 6. Her current exercise regime is walking and performing stretching exercises.  Ms. Lammons Morphine equivalent is 15.00 MME.   UDS ordered today.    Pain Inventory Average Pain 6 Pain Right Now 6 My pain is constant, sharp, burning, dull, stabbing, tingling, aching, and spasms  In the last 24 hours, has pain interfered with the following? General activity 5 Relation with others 5 Enjoyment of life 5 What TIME of day is your pain at its worst? morning , daytime, evening, and night Sleep (in general) Fair  Pain is worse with: walking, bending, sitting, inactivity, standing, and some activites Pain improves with: rest, heat/ice, therapy/exercise, pacing activities, and medication Relief from Meds: 5  Family History  Problem Relation Age of Onset   Heart attack Mother    Cancer Father    Heart attack Brother    Social History   Socioeconomic History   Marital status: Significant Other    Spouse name: Not on file   Number of children: 1   Years of education: Not on file   Highest education level: Not on file  Occupational History   Occupation: Disabled  Tobacco Use   Smoking status: Former    Current packs/day: 0.00    Types: Cigarettes    Start date: 10/15/1999    Quit date: 10/14/2009    Years since quitting: 13.5   Smokeless tobacco: Never  Vaping Use   Vaping status: Never Used  Substance and Sexual Activity   Alcohol use: No   Drug use: No   Sexual activity: Not on file  Other Topics Concern   Not on file  Social History Narrative   Not on file   Social Determinants of Health   Financial Resource Strain: Not on  file  Food Insecurity: Not on file  Transportation Needs: Not on file  Physical Activity: Not on file  Stress: Not on file  Social Connections: Not on file   Past Surgical History:  Procedure Laterality Date   ABDOMINAL HYSTERECTOMY     ABDOMINAL SURGERY     APPENDECTOMY     CHOLECYSTECTOMY     CYST EXCISION     OVARY SURGERY     Past Surgical History:  Procedure Laterality Date   ABDOMINAL HYSTERECTOMY     ABDOMINAL SURGERY     APPENDECTOMY     CHOLECYSTECTOMY     CYST EXCISION     OVARY SURGERY     Past Medical History:  Diagnosis Date   Arm pain    left   Degenerative disc disease    Diabetes mellitus    Fibromyalgia    Hyperlipidemia    Hypertension    IBS (irritable bowel syndrome)    Osteoporosis    Ht 5\' 3"  (1.6 m)   BMI 23.38 kg/m   Opioid Risk Score:   Fall Risk Score:  `1  Depression screen [facility] 2/9     02/24/2023    3:06 PM 12/17/2022    9:24 AM 10/10/2022   11:07 AM 05/28/2022    8:09 AM 03/27/2022    8:34 AM 11/22/2021  10:08 AM 09/24/2021    9:43 AM  Depression screen PHQ 2/9  Decreased Interest 0 0 0 0 0 0 0  Down, Depressed, Hopeless 0 0 0 0 0 0 0  PHQ - 2 Score 0 0 0 0 0 0 0      Review of Systems  Musculoskeletal:  Positive for back pain and gait problem.       B/L shoulder pain B/L hand/wrist pain  L Knee pain B/L Foot pain   All other systems reviewed and are negative.     Objective:   Physical Exam Vitals and nursing note reviewed.  Constitutional:      Appearance: Normal appearance.  Cardiovascular:     Rate and Rhythm: Normal rate and regular rhythm.     Pulses: Normal pulses.     Heart sounds: Normal heart sounds.  Pulmonary:     Effort: Pulmonary effort is normal.     Breath sounds: Normal breath sounds.  Musculoskeletal:     Cervical back: Normal range of motion and neck supple.     Comments: Normal Muscle Bulk and Muscle Testing Reveals:  Upper Extremities: Full ROM and Muscle Strength 5/5  Thoracic Paraspinal  Tenderness: T-7-T-9 Lumbar Paraspinal Tenderness: L-4-L-5 Lower Extremities: Full ROM and Muscle Strength 5/5 Arises from Chair with ease Narrow Based  Gait     Skin:    General: Skin is warm and dry.  Neurological:     Mental Status: She is alert and oriented to person, place, and time.  Psychiatric:        Mood and Affect: Mood normal.        Behavior: Behavior normal.         Assessment & Plan:  1. Bilateral sacroiliac joint dysfunction: S/P  on 09/19/2022 with good relief noted.Marland Kitchen 04/28/2023 R L4 medial branch, R L5 dorsal ramus radiofrequency ablation Right S1,S2,S3, neurolysis under fluoro guidance    S/P Left L4,L5,S1,2,3 Radiofrequency : on 08/08/2022 with good relief noted.           Continue slow weaning : Refilled: HYDROcodone 5/325mg  one tablet every 6 hrs as needed # 100. Second script sent for the following month. We will continue the opioid monitoring program, this consists of regular clinic visits, examinations, urine drug screen, pill counts as well as use of [place] Controlled Substance Reporting system. A 12 month History has been reviewed on the [place] Controlled Substance Reporting System on 04/28/2023.  2. Chronic pain syndrome consistent with fibromyalgia. Continue with exercise regime, heat therapy,voltaren gel, lidocaine patches and topamax. Continue current analgesics . 04/28/2023 3. Lumbosacral Spondylosis/ Bilateral lumbar radiculopathy:Continue Elavil and Cymbalta. 04/28/2023,  4. Complex Regional Pain Syndrome: Continue Cymbalta. .Dr. Yolanda Bonine Following. 04/28/2023 5. Type 2 diabetes.: PCP Following. 04/28/2023 6. Muscle Spasms: Continue Tizanidine 4 mg one tablet every 8 hours as needed for muscle spasms. 04/28/2023 7. Insomnia: Continue Elavil. Continue to monitor. 04/28/2023 8.Cervical Radiculopathy:Continue Elavil: Continue to monitor. 04/28/2023. 9. Cervicalgia/ Cervical Inflammation: Continue current medication regime. Alternate with  heat and Ice Therapy. 04/28/2023 10. Bilateral Thoracic Pain:Continue HEP as Tolerated.  Continue current medication regime. Continue to Monitor. 04/28/2023 11. Bilateral Shoulder Pain L>R: No complaints today. Continue with HEP as Tolerated. Continue to Alternate Ice and Heat. Continue to monitor. Ortho Following. 04/28/2023  12. Bilateral Hand and Bilateral Feet Neuropathic Pain:  Continue current medication regimen. Continue HEP as tolerated. Continue to monitor. 04/28/2023 13. Polyarthralgia: Continue HEP as Tolerated. Continue to alternate Ice and Heat Therapy. Continue  to monitor. 04/28/2023   F/U in 2 months

## 2023-04-30 LAB — TOXASSURE SELECT,+ANTIDEPR,UR

## 2023-06-01 ENCOUNTER — Other Ambulatory Visit: Payer: Self-pay | Admitting: Registered Nurse

## 2023-06-04 NOTE — Telephone Encounter (Signed)
Call placed to Shannon French, regarding her Cymbalta  No answer left message to return the call.

## 2023-06-04 NOTE — Telephone Encounter (Signed)
Refill request on Duloxetine and your last note states "Complex Regional Pain Syndrome: Continue Cymbalta. .Dr. Yolanda Bonine Following. 04/28/2023" so does that mean this is the doctor that prescribe this medication?

## 2023-06-16 ENCOUNTER — Encounter: Payer: Self-pay | Admitting: Registered Nurse

## 2023-06-16 ENCOUNTER — Encounter: Payer: Medicare Other | Attending: Physical Medicine & Rehabilitation | Admitting: Registered Nurse

## 2023-06-16 VITALS — BP 126/78 | HR 91 | Ht 63.0 in | Wt 132.0 lb

## 2023-06-16 DIAGNOSIS — Z79891 Long term (current) use of opiate analgesic: Secondary | ICD-10-CM | POA: Insufficient documentation

## 2023-06-16 DIAGNOSIS — G894 Chronic pain syndrome: Secondary | ICD-10-CM | POA: Diagnosis present

## 2023-06-16 DIAGNOSIS — G8929 Other chronic pain: Secondary | ICD-10-CM | POA: Insufficient documentation

## 2023-06-16 DIAGNOSIS — M47817 Spondylosis without myelopathy or radiculopathy, lumbosacral region: Secondary | ICD-10-CM | POA: Insufficient documentation

## 2023-06-16 DIAGNOSIS — M25511 Pain in right shoulder: Secondary | ICD-10-CM | POA: Diagnosis present

## 2023-06-16 DIAGNOSIS — M542 Cervicalgia: Secondary | ICD-10-CM | POA: Insufficient documentation

## 2023-06-16 DIAGNOSIS — M546 Pain in thoracic spine: Secondary | ICD-10-CM | POA: Diagnosis not present

## 2023-06-16 DIAGNOSIS — Z5181 Encounter for therapeutic drug level monitoring: Secondary | ICD-10-CM | POA: Insufficient documentation

## 2023-06-16 DIAGNOSIS — M25512 Pain in left shoulder: Secondary | ICD-10-CM | POA: Diagnosis present

## 2023-06-16 DIAGNOSIS — M62830 Muscle spasm of back: Secondary | ICD-10-CM | POA: Diagnosis present

## 2023-06-16 MED ORDER — DULOXETINE HCL 30 MG PO CPEP: ORAL_CAPSULE | ORAL | 2 refills | Status: DC

## 2023-06-16 MED ORDER — HYDROCODONE-ACETAMINOPHEN 10-325 MG PO TABS: ORAL_TABLET | ORAL | 0 refills | Status: DC

## 2023-06-16 NOTE — Progress Notes (Signed)
Subjective:    Patient ID: Shannon French, female    DOB: 26-Mar-1966, 57 y.o.   MRN: [id number]  HPI: Shannon French is a 57 y.o. female who returns for follow up appointment for chronic pain and medication refill. She states her pain is located in her neck, bilateral shoulders, mid- back, bilateral hips and left knee pain. She rates her pain 6. Her current exercise regime is walking, performing stretching exercises and attending [employer] two days a week for swimming. .  [name] Morphine equivalent is 15.00 MME.   Last UDS was Performed on 04/28/2023. It was consistent.    Pain Inventory Average Pain 7 Pain Right Now 6 My pain is constant, sharp, burning, dull, stabbing, tingling, and aching  In the last 24 hours, has pain interfered with the following? General activity 5 Relation with others 5 Enjoyment of life 5 What TIME of day is your pain at its worst? morning  and night Sleep (in general) Fair  Pain is worse with: walking, bending, sitting, inactivity, standing, and some activites Pain improves with: rest, heat/ice, therapy/exercise, pacing activities, and medication Relief from Meds: 6  Family History  Problem Relation Age of Onset   Heart attack Mother    Cancer Father    Heart attack Brother    Social History   Socioeconomic History   Marital status: Significant Other    Spouse name: Not on file   Number of children: 1   Years of education: Not on file   Highest education level: Not on file  Occupational History   Occupation: Disabled  Tobacco Use   Smoking status: Former    Current packs/day: 0.00    Types: Cigarettes    Start date: 10/15/1999    Quit date: 10/14/2009    Years since quitting: 13.6   Smokeless tobacco: Never  Vaping Use   Vaping status: Never Used  Substance and Sexual Activity   Alcohol use: No   Drug use: No   Sexual activity: Not on file  Other Topics Concern   Not on file  Social History Narrative   Not on file   Social Determinants  of Health   Financial Resource Strain: Not on file  Food Insecurity: Low Risk  (05/12/2023)   Received from [facility]   Hunger Vital Sign    Worried About Running Out of Food in the Last Year: Never true    Ran Out of Food in the Last Year: Never true  Transportation Needs: No Transportation Needs (05/12/2023)   Received from [facility]    In the past 12 months, has lack of reliable transportation kept you from medical appointments, meetings, work or from getting things needed for daily living? : No  Physical Activity: Not on file  Stress: Not on file  Social Connections: Not on file   Past Surgical History:  Procedure Laterality Date   ABDOMINAL HYSTERECTOMY     ABDOMINAL SURGERY     APPENDECTOMY     CHOLECYSTECTOMY     CYST EXCISION     OVARY SURGERY     Past Surgical History:  Procedure Laterality Date   ABDOMINAL HYSTERECTOMY     ABDOMINAL SURGERY     APPENDECTOMY     CHOLECYSTECTOMY     CYST EXCISION     OVARY SURGERY     Past Medical History:  Diagnosis Date   Arm pain    left   Degenerative disc disease    Diabetes mellitus  Fibromyalgia    Hyperlipidemia    Hypertension    IBS (irritable bowel syndrome)    Osteoporosis    Pulse 91   Ht 5\' 3"  (1.6 m)   Wt 132 lb (59.9 kg)   SpO2 98%   BMI 23.38 kg/m   Opioid Risk Score:   Fall Risk Score:  `1  Depression screen [facility] 2/9     06/16/2023   10:23 AM 02/24/2023    3:06 PM 12/17/2022    9:24 AM 10/10/2022   11:07 AM 05/28/2022    8:09 AM 03/27/2022    8:34 AM 11/22/2021   10:08 AM  Depression screen PHQ 2/9  Decreased Interest 0 0 0 0 0 0 0  Down, Depressed, Hopeless 0 0 0 0 0 0 0  PHQ - 2 Score 0 0 0 0 0 0 0     Review of Systems  Musculoskeletal:  Positive for back pain, gait problem and neck pain.       B/L shoulder, elbow, hip pain  All other systems reviewed and are negative.     Objective:   Physical Exam Vitals and nursing note reviewed.  Constitutional:       Appearance: Normal appearance.  Cardiovascular:     Rate and Rhythm: Normal rate and regular rhythm.     Pulses: Normal pulses.     Heart sounds: Normal heart sounds.  Pulmonary:     Effort: Pulmonary effort is normal.     Breath sounds: Normal breath sounds.  Musculoskeletal:     Comments: Normal Muscle Bulk and Muscle Testing Reveals:  Upper Extremities: Full ROM and Muscle Strength 5/5  Thoracic Paraspinal Tenderness: T-7-T-9 Lumbar Paraspinal Tenderness: L-3-L-5 Lower Extremities: Full ROM and Muscle Strength 5/5 Arises from Table with ease Narrow Based Gait     Skin:    General: Skin is warm and dry.  Neurological:     Mental Status: She is alert and oriented to person, place, and time.  Psychiatric:        Mood and Affect: Mood normal.        Behavior: Behavior normal.         Assessment & Plan:  1. Bilateral sacroiliac joint dysfunction: S/P  on 09/19/2022 with good relief noted.. 06/16/2023 R L4 medial branch, R L5 dorsal ramus radiofrequency ablation Right S1,S2,S3, neurolysis under fluoro guidance    S/P Left L4,L5,S1,2,3 Radiofrequency : on 08/08/2022 with good relief noted.           Continue slow weaning : Refilled: HYDROcodone 10/325mg  0.5 tablet  three times a day as needed for pain #45.Marland Kitchen Second script sent for the following month. We will continue the opioid monitoring program, this consists of regular clinic visits, examinations, urine drug screen, pill counts as well as use of [place] Controlled Substance Reporting system. A 12 month History has been reviewed on the [place] Controlled Substance Reporting System on 06/16/2023.  2. Chronic pain syndrome consistent with fibromyalgia. Continue with exercise regime, heat therapy,voltaren gel, lidocaine patches and topamax. Continue current analgesics . 06/16/2023 3. Lumbosacral Spondylosis/ Bilateral lumbar radiculopathy:Continue Elavil and Cymbalta. 06/16/2023,  4. Complex Regional Pain Syndrome:  Continue Cymbalta. .Dr. Yolanda Bonine Following. 06/16/2023 5. Type 2 diabetes.: PCP Following. 06/16/2023 6. Muscle Spasms: Continue Tizanidine 4 mg one tablet every 8 hours as needed for muscle spasms. 06/16/2023 7. Insomnia: Continue Elavil. Continue to monitor. 06/16/2023 8.Cervical Radiculopathy:Continue Elavil: Continue to monitor. 06/16/2023. 9. Cervicalgia/ Cervical Inflammation: Continue current medication regime. Alternate with heat  and Ice Therapy. 06/16/2023 10. Bilateral Thoracic Pain:Continue HEP as Tolerated.  Continue current medication regime. Continue to Monitor. 06/16/2023 11. Bilateral Shoulder Pain L>R: Continue with HEP as Tolerated. Continue to Alternate Ice and Heat. Continue to monitor. Ortho Following. 06/16/2023  12. Bilateral Hand and Bilateral Feet Neuropathic Pain:  Continue current medication regimen. Continue HEP as tolerated. Continue to monitor. 06/16/2023 13. Polyarthralgia: Continue HEP as Tolerated. Continue to alternate Ice and Heat Therapy. Continue to monitor. 06/16/2023   F/U in 2 months

## 2023-08-18 ENCOUNTER — Encounter: Payer: Medicare Other | Admitting: Registered Nurse

## 2023-08-20 ENCOUNTER — Encounter: Payer: Medicare Other | Attending: Physical Medicine & Rehabilitation | Admitting: Registered Nurse

## 2023-08-20 ENCOUNTER — Encounter: Payer: Self-pay | Admitting: Registered Nurse

## 2023-08-20 VITALS — BP 124/82 | HR 83 | Ht 63.0 in | Wt 137.0 lb

## 2023-08-20 DIAGNOSIS — M546 Pain in thoracic spine: Secondary | ICD-10-CM | POA: Insufficient documentation

## 2023-08-20 DIAGNOSIS — M47817 Spondylosis without myelopathy or radiculopathy, lumbosacral region: Secondary | ICD-10-CM | POA: Diagnosis present

## 2023-08-20 DIAGNOSIS — G8929 Other chronic pain: Secondary | ICD-10-CM | POA: Diagnosis present

## 2023-08-20 DIAGNOSIS — M62830 Muscle spasm of back: Secondary | ICD-10-CM | POA: Insufficient documentation

## 2023-08-20 DIAGNOSIS — M25511 Pain in right shoulder: Secondary | ICD-10-CM | POA: Diagnosis present

## 2023-08-20 DIAGNOSIS — M25512 Pain in left shoulder: Secondary | ICD-10-CM | POA: Insufficient documentation

## 2023-08-20 DIAGNOSIS — Z79891 Long term (current) use of opiate analgesic: Secondary | ICD-10-CM | POA: Diagnosis present

## 2023-08-20 DIAGNOSIS — G894 Chronic pain syndrome: Secondary | ICD-10-CM | POA: Diagnosis present

## 2023-08-20 DIAGNOSIS — M5416 Radiculopathy, lumbar region: Secondary | ICD-10-CM | POA: Insufficient documentation

## 2023-08-20 DIAGNOSIS — M542 Cervicalgia: Secondary | ICD-10-CM | POA: Insufficient documentation

## 2023-08-20 DIAGNOSIS — Z5181 Encounter for therapeutic drug level monitoring: Secondary | ICD-10-CM | POA: Diagnosis present

## 2023-08-20 DIAGNOSIS — M5412 Radiculopathy, cervical region: Secondary | ICD-10-CM | POA: Insufficient documentation

## 2023-08-20 MED ORDER — HYDROCODONE-ACETAMINOPHEN 10-325 MG PO TABS: ORAL_TABLET | ORAL | 0 refills | Status: DC

## 2023-08-20 NOTE — Progress Notes (Signed)
 Subjective:    Patient ID: Shannon French, female    DOB: 1965-12-29, 58 y.o.   MRN: [id number]  HPI: Shannon French is a 58 y.o. female who returns for follow up appointment for chronic pain and medication refill. She states her pain is located in her neck radiating into her bilateral shoulders, also reports bilateral shoulder pain,mid- lower back pain radiating into her bilateral hips and bilateral lower extremities. Also reports generalized joint pain. She rates her pain 5. Her current exercise regime is walking and performing stretching exercises.  Ms. Wykoff Morphine equivalent is 15.00 MME.   UDS ordered today.     Pain Inventory Average Pain 7 Pain Right Now 5 My pain is sharp, burning, dull, stabbing, tingling, and aching  In the last 24 hours, has pain interfered with the following? General activity 7 Relation with others 0 Enjoyment of life 3 What TIME of day is your pain at its worst? morning , daytime, evening, and night Sleep (in general) Poor  Pain is worse with: walking, bending, sitting, standing, and some activites Pain improves with: rest, heat/ice, and medication Relief from Meds: 4  Family History  Problem Relation Age of Onset   Heart attack Mother    Cancer Father    Heart attack Brother    Social History   Socioeconomic History   Marital status: Significant Other    Spouse name: Not on file   Number of children: 1   Years of education: Not on file   Highest education level: Not on file  Occupational History   Occupation: Disabled  Tobacco Use   Smoking status: Former    Current packs/day: 0.00    Types: Cigarettes    Start date: 10/15/1999    Quit date: 10/14/2009    Years since quitting: 13.8   Smokeless tobacco: Never  Vaping Use   Vaping status: Never Used  Substance and Sexual Activity   Alcohol  use: No   Drug use: No   Sexual activity: Not on file  Other Topics Concern   Not on file  Social History Narrative   Not on file   Social  Drivers of Health   Financial Resource Strain: Not on file  Food Insecurity: Low Risk  (05/12/2023)   Received from [facility]   Hunger Vital Sign    Worried About Running Out of Food in the Last Year: Never true    Ran Out of Food in the Last Year: Never true  Transportation Needs: No Transportation Needs (05/12/2023)   Received from [facility]    In the past 12 months, has lack of reliable transportation kept you from medical appointments, meetings, work or from getting things needed for daily living? : No  Physical Activity: Not on file  Stress: Not on file  Social Connections: Not on file   Past Surgical History:  Procedure Laterality Date   ABDOMINAL HYSTERECTOMY     ABDOMINAL SURGERY     APPENDECTOMY     CHOLECYSTECTOMY     CYST EXCISION     OVARY SURGERY     Past Surgical History:  Procedure Laterality Date   ABDOMINAL HYSTERECTOMY     ABDOMINAL SURGERY     APPENDECTOMY     CHOLECYSTECTOMY     CYST EXCISION     OVARY SURGERY     Past Medical History:  Diagnosis Date   Arm pain    left   Degenerative disc disease    Diabetes mellitus  Fibromyalgia    Hyperlipidemia    Hypertension    IBS (irritable bowel syndrome)    Osteoporosis    Ht 5\' 3"  (1.6 m)   Wt 137 lb (62.1 kg)   BMI 24.27 kg/m   Opioid Risk Score:   Fall Risk Score:  `1  Depression screen [facility] 2/9     08/20/2023    9:33 AM 06/16/2023   10:23 AM 02/24/2023    3:06 PM 12/17/2022    9:24 AM 10/10/2022   11:07 AM 05/28/2022    8:09 AM 03/27/2022    8:34 AM  Depression screen PHQ 2/9  Decreased Interest 0 0 0 0 0 0 0  Down, Depressed, Hopeless 0 0 0 0 0 0 0  PHQ - 2 Score 0 0 0 0 0 0 0     Review of Systems  Musculoskeletal:  Positive for back pain and gait problem.  All other systems reviewed and are negative.     Objective:   Physical Exam Vitals and nursing note reviewed.  Constitutional:      Appearance: Normal appearance.  Neck:     Comments: Cervical  Paraspinal Tenderness: C-5-C-6 Cardiovascular:     Rate and Rhythm: Normal rate and regular rhythm.     Pulses: Normal pulses.     Heart sounds: Normal heart sounds.  Pulmonary:     Effort: Pulmonary effort is normal.     Breath sounds: Normal breath sounds.  Musculoskeletal:     Comments: Normal Muscle Bulk and Muscle Testing Reveals:  Upper Extremities: Full ROM and Muscle Strength 5/5 Bilateral AC Joint Tenderness Thoracic and Lumbar Hypersensitivity Bilateral Greater Trochanter Tenderness Lower Extremities: Full ROM and Muscle Strength 5/5 Arises from Table  Slowly Narrow Based  Gait     Skin:    General: Skin is warm and dry.  Neurological:     Mental Status: She is alert and oriented to person, place, and time.  Psychiatric:        Mood and Affect: Mood normal.        Behavior: Behavior normal.         Assessment & Plan:  1. Bilateral sacroiliac joint dysfunction: S/P  on 09/19/2022 with good relief noted.Aaron Aas 08/20/2023 R L4 medial branch, R L5 dorsal ramus radiofrequency ablation Right S1,S2,S3, neurolysis under fluoro guidance    S/P Left L4,L5,S1,2,3 Radiofrequency : on 08/08/2022 with good relief noted.           Continue slow weaning : Refilled: HYDROcodone  5/325mg  one tablet every 6 hrs as needed # 100. Second script sent for the following month. We will continue the opioid monitoring program, this consists of regular clinic visits, examinations, urine drug screen, pill counts as well as use of [place]  Controlled Substance Reporting system. A 12 month History has been reviewed on the [place]  Controlled Substance Reporting System on 08/20/2023.  2. Chronic pain syndrome consistent with fibromyalgia. Continue with exercise regime, heat therapy,voltaren  gel, lidocaine  patches and topamax . Continue current analgesics . 08/20/2023 3. Lumbosacral Spondylosis/ Bilateral lumbar radiculopathy:Continue Elavil  and Cymbalta . 08/20/2023,  4. Complex Regional Pain  Syndrome: Continue Cymbalta . .Dr. Lorna Rose Following. 08/20/2023 5. Type 2 diabetes.: PCP Following. 08/20/2023 6. Muscle Spasms: Continue Tizanidine  4 mg one tablet every 8 hours as needed for muscle spasms. 08/20/2023 7. Insomnia: Continue Elavil . Continue to monitor. 08/20/2023 8.Cervical Radiculopathy:Continue Elavil : Continue to monitor. 08/20/2023. 9. Cervicalgia/ Cervical Inflammation: Continue current medication regime. Alternate with heat and Ice Therapy. 08/20/2023 10. Bilateral Thoracic Pain:Continue HEP  as Tolerated.  Continue current medication regime. Continue to Monitor. 08/20/2023 11. Bilateral Shoulder Pain L>R: No complaints today. Continue with HEP as Tolerated. Continue to Alternate Ice and Heat. Continue to monitor. Ortho Following. 08/20/2023  12. Bilateral Hand and Bilateral Feet Neuropathic Pain:  Continue current medication regimen. Continue HEP as tolerated. Continue to monitor. 08/20/2023 13. Polyarthralgia: Continue HEP as Tolerated. Continue to alternate Ice and Heat Therapy. Continue to monitor. 08/20/2023   F/U in 2 months

## 2023-08-24 LAB — TOXASSURE SELECT,+ANTIDEPR,UR

## 2023-10-20 ENCOUNTER — Encounter: Payer: Self-pay | Admitting: Registered Nurse

## 2023-10-20 ENCOUNTER — Encounter: Payer: Medicare Other | Attending: Physical Medicine & Rehabilitation | Admitting: Registered Nurse

## 2023-10-20 VITALS — BP 129/90 | HR 88 | Wt 159.0 lb

## 2023-10-20 DIAGNOSIS — Z5181 Encounter for therapeutic drug level monitoring: Secondary | ICD-10-CM

## 2023-10-20 DIAGNOSIS — G8929 Other chronic pain: Secondary | ICD-10-CM | POA: Diagnosis present

## 2023-10-20 DIAGNOSIS — M542 Cervicalgia: Secondary | ICD-10-CM

## 2023-10-20 DIAGNOSIS — M25512 Pain in left shoulder: Secondary | ICD-10-CM | POA: Insufficient documentation

## 2023-10-20 DIAGNOSIS — Z79891 Long term (current) use of opiate analgesic: Secondary | ICD-10-CM

## 2023-10-20 DIAGNOSIS — M2550 Pain in unspecified joint: Secondary | ICD-10-CM

## 2023-10-20 DIAGNOSIS — M5412 Radiculopathy, cervical region: Secondary | ICD-10-CM

## 2023-10-20 DIAGNOSIS — G894 Chronic pain syndrome: Secondary | ICD-10-CM

## 2023-10-20 DIAGNOSIS — M25511 Pain in right shoulder: Secondary | ICD-10-CM | POA: Diagnosis present

## 2023-10-20 DIAGNOSIS — M47817 Spondylosis without myelopathy or radiculopathy, lumbosacral region: Secondary | ICD-10-CM | POA: Diagnosis present

## 2023-10-20 DIAGNOSIS — M546 Pain in thoracic spine: Secondary | ICD-10-CM | POA: Diagnosis present

## 2023-10-20 DIAGNOSIS — M5416 Radiculopathy, lumbar region: Secondary | ICD-10-CM | POA: Diagnosis present

## 2023-10-20 MED ORDER — HYDROCODONE-ACETAMINOPHEN 10-325 MG PO TABS: ORAL_TABLET | ORAL | 0 refills | Status: DC

## 2023-10-20 NOTE — Progress Notes (Signed)
 Subjective:    Patient ID: Shannon French, female    DOB: 22-Oct-1965, 58 y.o.   MRN: [id number]  HPI: Shannon French is a 58 y.o. female who returns for follow up appointment for chronic pain and medication refill. She states her pain is located in her neck radiating into her bilateral shoulders L>R, mid- lower back radiating into her bilateral hips and left lower extremity. Also reports generalized joint pain. She rates her pain 5. Her current exercise regime is walking and performing stretching exercises.  Shannon French equivalent is 15.00 MME.   Last UDS was [place] on 08/20/2023, it was consistent.      Pain Inventory Average Pain 5 Pain Right Now 5 My pain is constant, sharp, burning, dull, stabbing, tingling, and aching  In the last 24 hours, has pain interfered with the following? General activity 7 Relation with others 7 Enjoyment of life 0 What TIME of day is your pain at its worst? morning , daytime, evening, and night Sleep (in general) Fair  Pain is worse with: walking, bending, sitting, inactivity, and standing Pain improves with: rest, heat/ice, therapy/exercise, pacing activities, and medication Relief from Meds: 5  Family History  Problem Relation Age of Onset   Heart attack Mother    Cancer Father    Heart attack Brother    Social History   Socioeconomic History   Marital status: Significant Other    Spouse name: Not on file   Number of children: 1   Years of education: Not on file   Highest education level: Not on file  Occupational History   Occupation: Disabled  Tobacco Use   Smoking status: Former    Current packs/day: 0.00    Types: Cigarettes    Start date: 10/15/1999    Quit date: 10/14/2009    Years since quitting: 14.0   Smokeless tobacco: Never  Vaping Use   Vaping status: Never Used  Substance and Sexual Activity   Alcohol use: No   Drug use: No   Sexual activity: Not on file  Other Topics Concern   Not on file  Social History  Narrative   Not on file   Social Drivers of Health   Financial Resource Strain: Not on file  Food Insecurity: Low Risk  (05/12/2023)   Received from [facility]   Hunger Vital Sign    Worried About Running Out of Food in the Last Year: Never true    Ran Out of Food in the Last Year: Never true  Transportation Needs: No Transportation Needs (05/12/2023)   Received from [facility]    In the past 12 months, has lack of reliable transportation kept you from medical appointments, meetings, work or from getting things needed for daily living? : No  Physical Activity: Not on file  Stress: Not on file  Social Connections: Not on file   Past Surgical History:  Procedure Laterality Date   ABDOMINAL HYSTERECTOMY     ABDOMINAL SURGERY     APPENDECTOMY     CHOLECYSTECTOMY     CYST EXCISION     OVARY SURGERY     Past Surgical History:  Procedure Laterality Date   ABDOMINAL HYSTERECTOMY     ABDOMINAL SURGERY     APPENDECTOMY     CHOLECYSTECTOMY     CYST EXCISION     OVARY SURGERY     Past Medical History:  Diagnosis Date   Arm pain    left   Degenerative disc  disease    Diabetes mellitus    Fibromyalgia    Hyperlipidemia    Hypertension    IBS (irritable bowel syndrome)    Osteoporosis    There were no vitals taken for this visit.  Opioid Risk Score:   Fall Risk Score:  `1  Depression screen [facility] 2/9     08/20/2023    9:33 AM 06/16/2023   10:23 AM 02/24/2023    3:06 PM 12/17/2022    9:24 AM 10/10/2022   11:07 AM 05/28/2022    8:09 AM 03/27/2022    8:34 AM  Depression screen PHQ 2/9  Decreased Interest 0 0 0 0 0 0 0  Down, Depressed, Hopeless 0 0 0 0 0 0 0  PHQ - 2 Score 0 0 0 0 0 0 0    Review of Systems  Musculoskeletal:  Positive for back pain and neck pain.       Pain in both hips and elbows and hand  All other systems reviewed and are negative.      Objective:   Physical Exam Vitals and nursing note reviewed.  Constitutional:       Appearance: Normal appearance.  Cardiovascular:     Rate and Rhythm: Normal rate and regular rhythm.     Pulses: Normal pulses.     Heart sounds: Normal heart sounds.  Pulmonary:     Effort: Pulmonary effort is normal.     Breath sounds: Normal breath sounds.  Musculoskeletal:     Comments: Normal Muscle Bulk and Muscle Testing Reveals:  Upper Extremities: Full ROM and Muscle Strength 5/5  Bilateral AC Joint Tenderness: L>R  Thoracic Paraspinal Tenderness: T-7-T-10 Lumbar Hypersensitivity Bilateral Greater Trochanter Tenderness Lower Extremities: Full ROM and Muscle Strength 5/5 Arises from chair with ease Narrow Based  Gait     Skin:    General: Skin is warm and dry.  Neurological:     Mental Status: She is alert and oriented to person, place, and time.  Psychiatric:        Mood and Affect: Mood normal.        Behavior: Behavior normal.         Assessment & Plan:  1. Bilateral sacroiliac joint dysfunction: S/P  on 09/19/2022 with good relief noted.Marland Kitchen 10/20/2023 R L4 medial branch, R L5 dorsal ramus radiofrequency ablation Right S1,S2,S3, neurolysis under fluoro guidance    S/P Left L4,L5,S1,2,3 Radiofrequency : on 08/08/2022 with good relief noted.           Continue slow weaning : Refilled: HYDROcodone 5/325mg  one tablet every 6 hrs as needed # 100. Second script sent for the following month. We will continue the opioid monitoring program, this consists of regular clinic visits, examinations, urine drug screen, pill counts as well as use of [place] Controlled Substance Reporting system. A 12 month History has been reviewed on the [place] Controlled Substance Reporting System on 10/20/2023.  2. Chronic pain syndrome consistent with fibromyalgia. Continue with exercise regime, heat therapy,voltaren gel, lidocaine patches and topamax. Continue current analgesics . 10/20/2023 3. Lumbosacral Spondylosis/ Bilateral lumbar radiculopathy:Continue Elavil and Cymbalta.  10/20/2023,  4. Complex Regional Pain Syndrome: Continue Cymbalta. .Dr. Yolanda Bonine Following. 10/20/2023 5. Type 2 diabetes.: PCP Following. 10/20/2023 6. Muscle Spasms: Continue Tizanidine 4 mg one tablet every 8 hours as needed for muscle spasms. 10/20/2023 7. Insomnia: Continue Elavil. Continue to monitor. 10/20/2023 8.Cervical Radiculopathy:Continue Elavil: Continue to monitor. 10/20/2023. 9. Cervicalgia/ Cervical Inflammation: Continue current medication regime. Alternate with heat and  Ice Therapy. 10/20/2023 10. Bilateral Thoracic Pain:Continue HEP as Tolerated.  Continue current medication regime. Continue to Monitor. 10/20/2023 11. Bilateral Shoulder Pain L>R: Continue with HEP as Tolerated. Continue to Alternate Ice and Heat. Continue to monitor. Ortho Following. 10/20/2023  12. Bilateral Hand and Bilateral Feet Neuropathic Pain:  Continue current medication regimen. Continue HEP as tolerated. Continue to monitor. 10/20/2023 13. Polyarthralgia: Continue HEP as Tolerated. Continue to alternate Ice and Heat Therapy. Continue to monitor. 10/20/2023   F/U in 2 months

## 2023-11-26 ENCOUNTER — Other Ambulatory Visit: Payer: Self-pay | Admitting: Registered Nurse

## 2023-11-26 DIAGNOSIS — M797 Fibromyalgia: Secondary | ICD-10-CM

## 2023-11-26 DIAGNOSIS — M47817 Spondylosis without myelopathy or radiculopathy, lumbosacral region: Secondary | ICD-10-CM

## 2023-11-29 ENCOUNTER — Other Ambulatory Visit: Payer: Self-pay | Admitting: Physical Medicine & Rehabilitation

## 2023-11-29 DIAGNOSIS — G894 Chronic pain syndrome: Secondary | ICD-10-CM

## 2023-11-29 DIAGNOSIS — G90512 Complex regional pain syndrome I of left upper limb: Secondary | ICD-10-CM

## 2023-12-09 ENCOUNTER — Other Ambulatory Visit: Payer: Self-pay | Admitting: Cardiovascular Disease

## 2023-12-24 ENCOUNTER — Encounter: Attending: Physical Medicine & Rehabilitation | Admitting: Registered Nurse

## 2023-12-24 VITALS — BP 126/82 | HR 79 | Ht 63.0 in | Wt 135.0 lb

## 2023-12-24 DIAGNOSIS — M25512 Pain in left shoulder: Secondary | ICD-10-CM | POA: Insufficient documentation

## 2023-12-24 DIAGNOSIS — Z5181 Encounter for therapeutic drug level monitoring: Secondary | ICD-10-CM | POA: Diagnosis present

## 2023-12-24 DIAGNOSIS — M25511 Pain in right shoulder: Secondary | ICD-10-CM | POA: Insufficient documentation

## 2023-12-24 DIAGNOSIS — G894 Chronic pain syndrome: Secondary | ICD-10-CM | POA: Diagnosis present

## 2023-12-24 DIAGNOSIS — M546 Pain in thoracic spine: Secondary | ICD-10-CM | POA: Diagnosis present

## 2023-12-24 DIAGNOSIS — M5416 Radiculopathy, lumbar region: Secondary | ICD-10-CM | POA: Diagnosis present

## 2023-12-24 DIAGNOSIS — Z79891 Long term (current) use of opiate analgesic: Secondary | ICD-10-CM | POA: Insufficient documentation

## 2023-12-24 DIAGNOSIS — M2550 Pain in unspecified joint: Secondary | ICD-10-CM | POA: Insufficient documentation

## 2023-12-24 DIAGNOSIS — G8929 Other chronic pain: Secondary | ICD-10-CM | POA: Diagnosis present

## 2023-12-24 DIAGNOSIS — M5412 Radiculopathy, cervical region: Secondary | ICD-10-CM | POA: Diagnosis present

## 2023-12-24 DIAGNOSIS — M542 Cervicalgia: Secondary | ICD-10-CM | POA: Insufficient documentation

## 2023-12-24 MED ORDER — HYDROCODONE-ACETAMINOPHEN 10-325 MG PO TABS: ORAL_TABLET | ORAL | 0 refills | Status: DC

## 2023-12-24 NOTE — Progress Notes (Unsigned)
 Subjective:    Patient ID: Shannon French, female    DOB: May 26, 1966, 58 y.o.   MRN: [id number]  HPI: Shannon French is a 58 y.o. female who returns for follow up appointment for chronic pain and medication refill. She  states her pain is located in her neck radiating into her bilateral shoulders, upper- lower back radiating into her buttocks, lower back radiating into her lower extremities and bilateral feet with tingling and burning. Also reports bilateral hand pain. She rates her pain 5. Her current exercise regime is walking and performing stretching exercises.  Ms. Pitter Morphine equivalent is 15.00 MME.   Oral Swab was Performed today.   Pain Inventory Average Pain 5 Pain Right Now 5 My pain is constant, sharp, burning, dull, stabbing, tingling, and aching  In the last 24 hours, has pain interfered with the following? General activity 5 Relation with others 6 Enjoyment of life 6 What TIME of day is your pain at its worst? morning , daytime, evening, and night Sleep (in general) Fair  Pain is worse with: walking, bending, sitting, inactivity, standing, and some activites Pain improves with: rest, heat/ice, therapy/exercise, pacing activities, and medication Relief from Meds: 5  Family History  Problem Relation Age of Onset   Heart attack Mother    Cancer Father    Heart attack Brother    Social History   Socioeconomic History   Marital status: Significant Other    Spouse name: Not on file   Number of children: 1   Years of education: Not on file   Highest education level: Not on file  Occupational History   Occupation: Disabled  Tobacco Use   Smoking status: Former    Current packs/day: 0.00    Types: Cigarettes    Start date: 10/15/1999    Quit date: 10/14/2009    Years since quitting: 14.2   Smokeless tobacco: Never  Vaping Use   Vaping status: Never Used  Substance and Sexual Activity   Alcohol  use: No   Drug use: No   Sexual activity: Not on file  Other  Topics Concern   Not on file  Social History Narrative   Not on file   Social Drivers of Health   Financial Resource Strain: Not on file  Food Insecurity: Low Risk  (11/11/2023)   Received from [facility]   Hunger Vital Sign    Worried About Running Out of Food in the Last Year: Never true    Ran Out of Food in the Last Year: Never true  Transportation Needs: No Transportation Needs (11/11/2023)   Received from [facility]    In the past 12 months, has lack of reliable transportation kept you from medical appointments, meetings, work or from getting things needed for daily living? : No  Physical Activity: Not on file  Stress: Not on file  Social Connections: Not on file   Past Surgical History:  Procedure Laterality Date   ABDOMINAL HYSTERECTOMY     ABDOMINAL SURGERY     APPENDECTOMY     CHOLECYSTECTOMY     CYST EXCISION     OVARY SURGERY     Past Surgical History:  Procedure Laterality Date   ABDOMINAL HYSTERECTOMY     ABDOMINAL SURGERY     APPENDECTOMY     CHOLECYSTECTOMY     CYST EXCISION     OVARY SURGERY     Past Medical History:  Diagnosis Date   Arm pain    left  Degenerative disc disease    Diabetes mellitus    Fibromyalgia    Hyperlipidemia    Hypertension    IBS (irritable bowel syndrome)    Osteoporosis    BP 126/82   Pulse 79   Ht 5\' 3"  (1.6 m)   Wt 135 lb (61.2 kg)   SpO2 96%   BMI 23.91 kg/m   Opioid Risk Score:   Fall Risk Score:  `1  Depression screen [facility] 2/9     08/20/2023    9:33 AM 06/16/2023   10:23 AM 02/24/2023    3:06 PM 12/17/2022    9:24 AM 10/10/2022   11:07 AM 05/28/2022    8:09 AM 03/27/2022    8:34 AM  Depression screen PHQ 2/9  Decreased Interest 0 0 0 0 0 0 0  Down, Depressed, Hopeless 0 0 0 0 0 0 0  PHQ - 2 Score 0 0 0 0 0 0 0     Review of Systems  Musculoskeletal:  Positive for back pain and neck pain.       Bilateral leg pain Bilateral hand pain  All other systems reviewed and are  negative.     Objective:   Physical Exam Vitals and nursing note reviewed.  Constitutional:      Appearance: Normal appearance.  Neck:     Comments: Cervical Paraspinal Tenderness: C-5-C-6 Cardiovascular:     Rate and Rhythm: Normal rate and regular rhythm.     Pulses: Normal pulses.     Heart sounds: Normal heart sounds.  Pulmonary:     Effort: Pulmonary effort is normal.     Breath sounds: Normal breath sounds.  Musculoskeletal:     Comments: Normal Muscle Bulk and Muscle Testing Reveals:  Upper Extremities: Full ROM and Muscle Strength 5/5 Bilateral AC Joint Tenderness: R>L Thoracic and Lumbar Hypersensitivity Lower Extremities: Full ROM and Muscle Strength 5/5 Arises from Table with ease Narrow Based  Gait     Skin:    General: Skin is warm and dry.  Neurological:     Mental Status: She is alert and oriented to person, place, and time.  Psychiatric:        Mood and Affect: Mood normal.        Behavior: Behavior normal.         Assessment & Plan:  1. Bilateral sacroiliac joint dysfunction: S/P  on 09/19/2022 with good relief noted.Aaron Aas 12/24/2023 R L4 medial branch, R L5 dorsal ramus radiofrequency ablation Right S1,S2,S3, neurolysis under fluoro guidance    S/P Left L4,L5,S1,2,3 Radiofrequency : on 08/08/2022 with good relief noted.           Continue slow weaning : Refilled: HYDROcodone  5/325mg  one tablet every 6 hrs as needed # 100. Second script sent for the following month. We will continue the opioid monitoring program, this consists of regular clinic visits, examinations, urine drug screen, pill counts as well as use of [place]  Controlled Substance Reporting system. A 12 month History has been reviewed on the [place]  Controlled Substance Reporting System on 12/24/2023.  2. Chronic pain syndrome consistent with fibromyalgia. Continue with exercise regime, heat therapy,voltaren  gel, lidocaine  patches and topamax . Continue current analgesics .  12/24/2023 3. Lumbosacral Spondylosis/ Bilateral lumbar radiculopathy:Continue Elavil  and Cymbalta . 12/24/2023,  4. Complex Regional Pain Syndrome: Continue Cymbalta . .Dr. Lorna Rose Following. 12/24/2023 5. Type 2 diabetes.: PCP Following. 12/24/2023 6. Muscle Spasms: Continue Tizanidine  4 mg one tablet every 8 hours as needed for muscle spasms. 12/24/2023 7. Insomnia: Continue  Elavil . Continue to monitor. 12/24/2023 8.Cervical Radiculopathy:Continue Elavil : Continue to monitor. 12/24/2023. 9. Cervicalgia/ Cervical Inflammation: Continue current medication regime. Alternate with heat and Ice Therapy. 12/24/2023 10. Bilateral Thoracic Pain:Continue HEP as Tolerated.  Continue current medication regime. Continue to Monitor. 12/24/2023 11. Bilateral Shoulder Pain L>R: Continue with HEP as Tolerated. Continue to Alternate Ice and Heat. Continue to monitor. Ortho Following. 12/24/2023  12. Bilateral Hand and Bilateral Feet Neuropathic Pain:  Continue current medication regimen. Continue HEP as tolerated. Continue to monitor. 12/24/2023 13. Polyarthralgia: Continue HEP as Tolerated. Continue to alternate Ice and Heat Therapy. Continue to monitor. 12/24/2023   F/U in 2 months

## 2023-12-28 LAB — DRUG TOX MONITOR 1 W/CONF, ORAL FLD
Amphetamines: NEGATIVE ng/mL (ref <10)
Barbiturates: NEGATIVE ng/mL (ref <10)
Benzodiazepines: NEGATIVE ng/mL (ref <0.50)
Buprenorphine: NEGATIVE ng/mL (ref <0.10)
Cocaine: NEGATIVE ng/mL (ref <5.0)
Codeine: NEGATIVE ng/mL (ref <2.5)
Cotinine: 28.3 ng/mL — ABNORMAL HIGH (ref <5.0)
Dihydrocodeine: NEGATIVE ng/mL (ref <2.5)
Fentanyl: NEGATIVE ng/mL (ref <0.10)
Heroin Metabolite: NEGATIVE ng/mL (ref <1.0)
Hydrocodone: 13.3 ng/mL — ABNORMAL HIGH (ref <2.5)
Hydromorphone: NEGATIVE ng/mL (ref <2.5)
MARIJUANA: NEGATIVE ng/mL (ref <2.5)
MDMA: NEGATIVE ng/mL (ref <10)
Meprobamate: NEGATIVE ng/mL (ref <2.5)
Methadone: NEGATIVE ng/mL (ref <5.0)
Morphine: NEGATIVE ng/mL (ref <2.5)
Nicotine Metabolite: POSITIVE ng/mL — AB (ref <5.0)
Norhydrocodone: 3.1 ng/mL — ABNORMAL HIGH (ref <2.5)
Noroxycodone: NEGATIVE ng/mL (ref <2.5)
Opiates: POSITIVE ng/mL — AB (ref <2.5)
Oxycodone: NEGATIVE ng/mL (ref <2.5)
Oxymorphone: NEGATIVE ng/mL (ref <2.5)
Phencyclidine: NEGATIVE ng/mL (ref <10)
Tapentadol: NEGATIVE ng/mL (ref <5.0)
Tramadol: NEGATIVE ng/mL (ref <5.0)
Zolpidem: NEGATIVE ng/mL (ref <5.0)

## 2023-12-28 LAB — DRUG TOX ALC METAB W/CON, ORAL FLD: Alcohol Metabolite: NEGATIVE ng/mL (ref <25)

## 2023-12-29 ENCOUNTER — Encounter: Payer: Self-pay | Admitting: Registered Nurse

## 2023-12-29 ENCOUNTER — Other Ambulatory Visit: Payer: Self-pay | Admitting: Registered Nurse

## 2023-12-29 DIAGNOSIS — Z79899 Other long term (current) drug therapy: Secondary | ICD-10-CM

## 2023-12-29 DIAGNOSIS — Z5181 Encounter for therapeutic drug level monitoring: Secondary | ICD-10-CM

## 2023-12-29 DIAGNOSIS — M542 Cervicalgia: Secondary | ICD-10-CM

## 2023-12-29 DIAGNOSIS — G894 Chronic pain syndrome: Secondary | ICD-10-CM

## 2023-12-29 DIAGNOSIS — G8929 Other chronic pain: Secondary | ICD-10-CM

## 2023-12-29 DIAGNOSIS — M47817 Spondylosis without myelopathy or radiculopathy, lumbosacral region: Secondary | ICD-10-CM

## 2023-12-29 DIAGNOSIS — G90512 Complex regional pain syndrome I of left upper limb: Secondary | ICD-10-CM

## 2024-01-27 ENCOUNTER — Other Ambulatory Visit: Payer: Self-pay | Admitting: Cardiovascular Disease

## 2024-02-18 ENCOUNTER — Encounter: Attending: Physical Medicine & Rehabilitation | Admitting: Registered Nurse

## 2024-02-18 ENCOUNTER — Encounter: Payer: Self-pay | Admitting: Registered Nurse

## 2024-02-18 VITALS — BP 142/90 | HR 90 | Ht 63.0 in | Wt 133.0 lb

## 2024-02-18 DIAGNOSIS — G894 Chronic pain syndrome: Secondary | ICD-10-CM | POA: Diagnosis present

## 2024-02-18 DIAGNOSIS — M25511 Pain in right shoulder: Secondary | ICD-10-CM | POA: Diagnosis present

## 2024-02-18 DIAGNOSIS — M25512 Pain in left shoulder: Secondary | ICD-10-CM | POA: Insufficient documentation

## 2024-02-18 DIAGNOSIS — Z5181 Encounter for therapeutic drug level monitoring: Secondary | ICD-10-CM | POA: Diagnosis present

## 2024-02-18 DIAGNOSIS — Z79891 Long term (current) use of opiate analgesic: Secondary | ICD-10-CM | POA: Diagnosis present

## 2024-02-18 DIAGNOSIS — M5416 Radiculopathy, lumbar region: Secondary | ICD-10-CM | POA: Insufficient documentation

## 2024-02-18 DIAGNOSIS — M542 Cervicalgia: Secondary | ICD-10-CM | POA: Diagnosis not present

## 2024-02-18 DIAGNOSIS — M2550 Pain in unspecified joint: Secondary | ICD-10-CM | POA: Diagnosis present

## 2024-02-18 DIAGNOSIS — G8929 Other chronic pain: Secondary | ICD-10-CM | POA: Diagnosis present

## 2024-02-18 DIAGNOSIS — M5412 Radiculopathy, cervical region: Secondary | ICD-10-CM | POA: Insufficient documentation

## 2024-02-18 DIAGNOSIS — M62830 Muscle spasm of back: Secondary | ICD-10-CM | POA: Insufficient documentation

## 2024-02-18 DIAGNOSIS — M546 Pain in thoracic spine: Secondary | ICD-10-CM | POA: Diagnosis present

## 2024-02-18 MED ORDER — HYDROCODONE-ACETAMINOPHEN 10-325 MG PO TABS: ORAL_TABLET | ORAL | 0 refills | Status: DC

## 2024-02-18 NOTE — Progress Notes (Signed)
 Subjective:    Patient ID: Shannon French, female    DOB: 1965-08-11, 58 y.o.   MRN: [id number]  HPI: Shannon French is a 58 y.o. female who returns for follow up appointment for chronic pain and medication refill. She states her  pain is located in her neck radiating into her left shoulder, bilateral shoulder pain , upper back and lower back pain radiating into her bilateral hips and bilateral lower extremities. She rates her pain 7. Her current exercise regime is walking and performing stretching exercises.  Ms. Gewirtz Morphine equivalent is 15.00 MME.   Last Oral Swab was Performed on 12/24/2023, it was consistent.    Pain Inventory Average Pain 7 Pain Right Now 7 My pain is constant, sharp, burning, dull, stabbing, tingling, and aching  In the last 24 hours, has pain interfered with the following? General activity 6 Relation with others 6 Enjoyment of life 2 What TIME of day is your pain at its worst? morning , daytime, evening, and night Sleep (in general) Fair  Pain is worse with: walking, bending, sitting, standing, and some activites Pain improves with: rest, heat/ice, therapy/exercise, pacing activities, and medication Relief from Meds: 4  Family History  Problem Relation Age of Onset   Heart attack Mother    Cancer Father    Heart attack Brother    Social History   Socioeconomic History   Marital status: Significant Other    Spouse name: Not on file   Number of children: 1   Years of education: Not on file   Highest education level: Not on file  Occupational History   Occupation: Disabled  Tobacco Use   Smoking status: Former    Current packs/day: 0.00    Types: Cigarettes    Start date: 10/15/1999    Quit date: 10/14/2009    Years since quitting: 14.3   Smokeless tobacco: Never  Vaping Use   Vaping status: Never Used  Substance and Sexual Activity   Alcohol  use: No   Drug use: No   Sexual activity: Not on file  Other Topics Concern   Not on file  Social  History Narrative   Not on file   Social Drivers of Health   Financial Resource Strain: Not on file  Food Insecurity: Low Risk  (11/11/2023)   Received from [facility]   Hunger Vital Sign    Within the past 12 months, you worried that your food would run out before you got money to buy more: Never true    Within the past 12 months, the food you bought just didn't last and you didn't have money to get more. : Never true  Transportation Needs: No Transportation Needs (11/11/2023)   Received from [facility]    In the past 12 months, has lack of reliable transportation kept you from medical appointments, meetings, work or from getting things needed for daily living? : No  Physical Activity: Not on file  Stress: Not on file  Social Connections: Not on file   Past Surgical History:  Procedure Laterality Date   ABDOMINAL HYSTERECTOMY     ABDOMINAL SURGERY     APPENDECTOMY     CHOLECYSTECTOMY     CYST EXCISION     OVARY SURGERY     Past Surgical History:  Procedure Laterality Date   ABDOMINAL HYSTERECTOMY     ABDOMINAL SURGERY     APPENDECTOMY     CHOLECYSTECTOMY     CYST EXCISION  OVARY SURGERY     Past Medical History:  Diagnosis Date   Arm pain    left   Degenerative disc disease    Diabetes mellitus    Fibromyalgia    Hyperlipidemia    Hypertension    IBS (irritable bowel syndrome)    Osteoporosis    BP (!) 142/90 (BP Location: Left Arm, Patient Position: Sitting, Cuff Size: Large) Comment: Second BP reading  Pulse 90   Ht 5' 3 (1.6 m)   Wt 133 lb (60.3 kg)   SpO2 98%   BMI 23.56 kg/m   Opioid Risk Score:   Fall Risk Score:  `1  Depression screen [facility] 2/9     02/18/2024    2:30 PM 08/20/2023    9:33 AM 06/16/2023   10:23 AM 02/24/2023    3:06 PM 12/17/2022    9:24 AM 10/10/2022   11:07 AM 05/28/2022    8:09 AM  Depression screen PHQ 2/9  Decreased Interest 0 0 0 0 0 0 0  Down, Depressed, Hopeless 0 0 0 0 0 0 0  PHQ - 2 Score 0 0 0  0 0 0 0  Altered sleeping 0        Tired, decreased energy 0        Change in appetite 0        Feeling bad or failure about yourself  0        Trouble concentrating 0        Moving slowly or fidgety/restless 0        Suicidal thoughts 0        PHQ-9 Score 0            Review of Systems  Musculoskeletal:  Positive for arthralgias, back pain and myalgias.       Widespread pain (bilateral shoulder pain, back pain  All other systems reviewed and are negative.      Objective:   Physical Exam Vitals and nursing note reviewed.  Constitutional:      Appearance: Normal appearance.  Cardiovascular:     Rate and Rhythm: Normal rate and regular rhythm.     Pulses: Normal pulses.     Heart sounds: Normal heart sounds.  Pulmonary:     Effort: Pulmonary effort is normal.     Breath sounds: Normal breath sounds.  Musculoskeletal:     Comments: Normal Muscle Bulk and Muscle Testing Reveals:  Upper Extremities: Full ROM and Muscle Strength 5/5 Bilateral AC Joint Tenderness: L>R  Thoracic Paraspinal Tenderness: T-1-T-2   T-4-T-6  Lumbar Hypersensitivity Lower Extremities : Full ROM and Muscle Strength 5/5 Arises from chair with ease Narrow Based Gait     Skin:    General: Skin is warm and dry.  Neurological:     Mental Status: She is alert and oriented to person, place, and time.  Psychiatric:        Mood and Affect: Mood normal.        Behavior: Behavior normal.          Assessment & Plan:  1. Bilateral sacroiliac joint dysfunction: S/P  on 09/19/2022 with good relief noted.SABRA 02/18/2024 R L4 medial branch, R L5 dorsal ramus radiofrequency ablation Right S1,S2,S3, neurolysis under fluoro guidance    S/P Left L4,L5,S1,2,3 Radiofrequency : on 08/08/2022 with good relief noted.           Continue slow weaning : Refilled: HYDROcodone  10/325mg  one half tablet three times a day as needed for pain #  45  We will continue the opioid monitoring program, this consists of regular  clinic visits, examinations, urine drug screen, pill counts as well as use of [place]  Controlled Substance Reporting system. A 12 month History has been reviewed on the   Controlled Substance Reporting System on 02/18/2024.  2. Chronic pain syndrome consistent with fibromyalgia. Continue with exercise regime, heat therapy,voltaren  gel, lidocaine  patches and topamax . Continue current analgesics . 02/18/2024 3. Lumbosacral Spondylosis/ Bilateral lumbar radiculopathy:Continue Elavil  and Cymbalta . 02/18/2024,  4. Complex Regional Pain Syndrome: Continue Cymbalta . .Dr. Ardeen Following. 02/18/2024 5. Type 2 diabetes.: PCP Following. 02/18/2024 6. Muscle Spasms: Continue Tizanidine  4 mg one tablet every 8 hours as needed for muscle spasms. 02/18/2024 7. Insomnia: Continue Elavil . Continue to monitor. 02/18/2024 8.Cervical Radiculopathy:Continue Elavil : Continue to monitor. 02/18/2024. 9. Cervicalgia/ Cervical Inflammation: Continue current medication regime. Alternate with heat and Ice Therapy. 02/18/2024 10. Bilateral Thoracic Pain:Continue HEP as Tolerated.  Continue current medication regime. Continue to Monitor. 02/18/2024 11. Bilateral Shoulder Pain L>R: Continue with HEP as Tolerated. Continue to Alternate Ice and Heat. Continue to monitor. Ortho Following. 02/18/2024  12. Bilateral Hand and Bilateral Feet Neuropathic Pain:  Continue current medication regimen. Continue HEP as tolerated. Continue to monitor. 02/18/2024 13. Polyarthralgia: Continue HEP as Tolerated. Continue to alternate Ice and Heat Therapy. Continue to monitor. 02/18/2024   F/U in 2 months

## 2024-02-18 NOTE — Addendum Note (Signed)
 Addended by: DEBBY FIDELA CROME on: 02/18/2024 04:22 PM   Modules accepted: Orders

## 2024-02-23 ENCOUNTER — Other Ambulatory Visit: Payer: Self-pay | Admitting: Cardiovascular Disease

## 2024-02-27 ENCOUNTER — Other Ambulatory Visit: Payer: Self-pay | Admitting: Registered Nurse

## 2024-03-07 ENCOUNTER — Other Ambulatory Visit: Payer: Self-pay | Admitting: Cardiovascular Disease

## 2024-03-08 ENCOUNTER — Other Ambulatory Visit: Payer: Self-pay | Admitting: Registered Nurse

## 2024-03-08 DIAGNOSIS — Z79899 Other long term (current) drug therapy: Secondary | ICD-10-CM

## 2024-03-08 DIAGNOSIS — M542 Cervicalgia: Secondary | ICD-10-CM

## 2024-03-08 DIAGNOSIS — G8929 Other chronic pain: Secondary | ICD-10-CM

## 2024-03-08 DIAGNOSIS — M47817 Spondylosis without myelopathy or radiculopathy, lumbosacral region: Secondary | ICD-10-CM

## 2024-03-08 DIAGNOSIS — G894 Chronic pain syndrome: Secondary | ICD-10-CM

## 2024-03-08 DIAGNOSIS — Z5181 Encounter for therapeutic drug level monitoring: Secondary | ICD-10-CM

## 2024-03-08 DIAGNOSIS — G90512 Complex regional pain syndrome I of left upper limb: Secondary | ICD-10-CM

## 2024-03-18 ENCOUNTER — Other Ambulatory Visit: Payer: Self-pay | Admitting: Cardiovascular Disease

## 2024-04-12 ENCOUNTER — Encounter: Attending: Physical Medicine & Rehabilitation | Admitting: Registered Nurse

## 2024-04-12 ENCOUNTER — Encounter: Payer: Self-pay | Admitting: Registered Nurse

## 2024-04-12 VITALS — BP 138/91 | HR 85 | Ht 63.0 in | Wt 136.0 lb

## 2024-04-12 DIAGNOSIS — G8929 Other chronic pain: Secondary | ICD-10-CM | POA: Diagnosis present

## 2024-04-12 DIAGNOSIS — M7062 Trochanteric bursitis, left hip: Secondary | ICD-10-CM | POA: Insufficient documentation

## 2024-04-12 DIAGNOSIS — M546 Pain in thoracic spine: Secondary | ICD-10-CM | POA: Diagnosis present

## 2024-04-12 DIAGNOSIS — G609 Hereditary and idiopathic neuropathy, unspecified: Secondary | ICD-10-CM | POA: Diagnosis present

## 2024-04-12 DIAGNOSIS — M542 Cervicalgia: Secondary | ICD-10-CM | POA: Diagnosis present

## 2024-04-12 DIAGNOSIS — M25511 Pain in right shoulder: Secondary | ICD-10-CM | POA: Diagnosis present

## 2024-04-12 DIAGNOSIS — G894 Chronic pain syndrome: Secondary | ICD-10-CM | POA: Insufficient documentation

## 2024-04-12 DIAGNOSIS — Z79891 Long term (current) use of opiate analgesic: Secondary | ICD-10-CM | POA: Insufficient documentation

## 2024-04-12 DIAGNOSIS — M25512 Pain in left shoulder: Secondary | ICD-10-CM | POA: Diagnosis present

## 2024-04-12 DIAGNOSIS — Z5181 Encounter for therapeutic drug level monitoring: Secondary | ICD-10-CM | POA: Insufficient documentation

## 2024-04-12 MED ORDER — HYDROCODONE-ACETAMINOPHEN 10-325 MG PO TABS: ORAL_TABLET | ORAL | 0 refills | Status: DC

## 2024-04-12 NOTE — Progress Notes (Signed)
 Subjective:    Patient ID: Shannon French, female    DOB: 1966-02-19, 58 y.o.   MRN: [id number]  HPI: Shannon French is a 58 y.o. female who returns for follow up appointment for chronic pain and medication refill. She states her pain is located in her neck, left shoulder, mid- back, and left hip. She denies any falls. She rates her pain 7.Her current exercise regime is walking, chair Piliates two days a week and performing stretching exercises.  Shannon French Morphine equivalent is 15.00 MME.   UDS ordered today.      Pain Inventory Average Pain 8 Pain Right Now 7 My pain is constant, sharp, burning, dull, stabbing, tingling, and aching  In the last 24 hours, has pain interfered with the following? General activity 7 Relation with others 4 Enjoyment of life 4 What TIME of day is your pain at its worst? morning , daytime, evening, and night Sleep (in general) Fair  Pain is worse with: walking, bending, sitting, inactivity, standing, and some activites Pain improves with: rest, heat/ice, therapy/exercise, pacing activities, and medication Relief from Meds: 4  Family History  Problem Relation Age of Onset   Heart attack Mother    Cancer Father    Heart attack Brother    Social History   Socioeconomic History   Marital status: Significant Other    Spouse name: Not on file   Number of children: 1   Years of education: Not on file   Highest education level: Not on file  Occupational History   Occupation: Disabled  Tobacco Use   Smoking status: Former    Current packs/day: 0.00    Types: Cigarettes    Start date: 10/15/1999    Quit date: 10/14/2009    Years since quitting: 14.5   Smokeless tobacco: Never  Vaping Use   Vaping status: Never Used  Substance and Sexual Activity   Alcohol  use: No   Drug use: No   Sexual activity: Not on file  Other Topics Concern   Not on file  Social History Narrative   Not on file   Social Drivers of Health   Financial Resource Strain:  Not on file  Food Insecurity: Low Risk  (11/11/2023)   Received from [facility]   Hunger Vital Sign    Within the past 12 months, you worried that your food would run out before you got money to buy more: Never true    Within the past 12 months, the food you bought just didn't last and you didn't have money to get more. : Never true  Transportation Needs: No Transportation Needs (11/11/2023)   Received from [facility]    In the past 12 months, has lack of reliable transportation kept you from medical appointments, meetings, work or from getting things needed for daily living? : No  Physical Activity: Not on file  Stress: Not on file  Social Connections: Not on file   Past Surgical History:  Procedure Laterality Date   ABDOMINAL HYSTERECTOMY     ABDOMINAL SURGERY     APPENDECTOMY     CHOLECYSTECTOMY     CYST EXCISION     OVARY SURGERY     Past Surgical History:  Procedure Laterality Date   ABDOMINAL HYSTERECTOMY     ABDOMINAL SURGERY     APPENDECTOMY     CHOLECYSTECTOMY     CYST EXCISION     OVARY SURGERY     Past Medical History:  Diagnosis Date  Arm pain    left   Degenerative disc disease    Diabetes mellitus    Fibromyalgia    Hyperlipidemia    Hypertension    IBS (irritable bowel syndrome)    Osteoporosis    There were no vitals taken for this visit.  Opioid Risk Score:   Fall Risk Score:  `1  Depression screen [facility] 2/9     02/18/2024    2:30 PM 08/20/2023    9:33 AM 06/16/2023   10:23 AM 02/24/2023    3:06 PM 12/17/2022    9:24 AM 10/10/2022   11:07 AM 05/28/2022    8:09 AM  Depression screen PHQ 2/9  Decreased Interest 0 0 0 0 0 0 0  Down, Depressed, Hopeless 0 0 0 0 0 0 0  PHQ - 2 Score 0 0 0 0 0 0 0  Altered sleeping 0        Tired, decreased energy 0        Change in appetite 0        Feeling bad or failure about yourself  0        Trouble concentrating 0        Moving slowly or fidgety/restless 0        Suicidal thoughts 0         PHQ-9 Score 0          Review of Systems  Musculoskeletal:  Positive for back pain and gait problem.       Left hip pain, left shoulder pain, pain in both legs, both elbows, both hands, both feet  All other systems reviewed and are negative.      Objective:   Physical Exam Vitals and nursing note reviewed.  Constitutional:      Appearance: Normal appearance.  Neck:     Comments: Cervical Paraspinal Tenderness: C-5-C-6 Cardiovascular:     Rate and Rhythm: Normal rate and regular rhythm.     Pulses: Normal pulses.     Heart sounds: Normal heart sounds.  Pulmonary:     Effort: Pulmonary effort is normal.     Breath sounds: Normal breath sounds.  Musculoskeletal:     Comments: Normal Muscle Bulk and Muscle Testing Reveals:  Upper Extremities: Full ROM and Muscle Strength 5/5 Left AC Joint Tenderness  Thoracic Paraspinal Tenderness: T-7-T-10  Lower Extremities: Full ROM and Muscle Strength 5/5 Arises from Chair with ease Narrow Based  Gait     Skin:    General: Skin is warm and dry.  Neurological:     Mental Status: She is alert and oriented to person, place, and time.  Psychiatric:        Mood and Affect: Mood normal.        Behavior: Behavior normal.          Assessment & Plan:  1. Bilateral sacroiliac joint dysfunction: S/P  on 09/19/2022 with good relief noted.SABRA 04/12/2024 R L4 medial branch, R L5 dorsal ramus radiofrequency ablation Right S1,S2,S3, neurolysis under fluoro guidance    S/P Left L4,L5,S1,2,3 Radiofrequency : on 08/08/2022 with good relief noted.           Continue slow weaning : Refilled: HYDROcodone  10/325mg  one half tablet three times a day as needed for pain # 45  We will continue the opioid monitoring program, this consists of regular clinic visits, examinations, urine drug screen, pill counts as well as use of [place]  Controlled Substance Reporting system. A 12 month History has been reviewed  on the   Controlled  Substance Reporting System on 04/12/2024.  2. Chronic pain syndrome consistent with fibromyalgia. Continue with exercise regime, heat therapy,voltaren  gel, lidocaine  patches and topamax . Continue current analgesics . 04/12/2024 3. Lumbosacral Spondylosis/ Bilateral lumbar radiculopathy:Continue Elavil  and Cymbalta . 04/12/2024,  4. Complex Regional Pain Syndrome: Continue Cymbalta . .Dr. Ardeen Following. 04/12/2024 5. Type 2 diabetes.: PCP Following. 04/12/2024 6. Muscle Spasms: Continue Tizanidine  4 mg one tablet every 8 hours as needed for muscle spasms. 04/12/2024 7. Insomnia: Continue Elavil . Continue to monitor. 04/12/2024 8.Cervical Radiculopathy:Continue Elavil : Continue to monitor. 04/12/2024. 9. Cervicalgia/ Cervical Inflammation: Continue current medication regime. Alternate with heat and Ice Therapy. 04/12/2024 10. Bilateral Thoracic Pain:Continue HEP as Tolerated.  Continue current medication regime. Continue to Monitor. 04/12/2024 11. Bilateral Shoulder Pain L>R: Continue with HEP as Tolerated. Continue to Alternate Ice and Heat. Continue to monitor. Ortho Following. 04/12/2024  12. Bilateral Hand and Bilateral Feet Neuropathic Pain:  Continue current medication regimen. Continue HEP as tolerated. Continue to monitor. 04/12/2024 13. Polyarthralgia: Continue HEP as Tolerated. Continue to alternate Ice and Heat Therapy. Continue to monitor. 04/12/2024   F/U in 2 months

## 2024-04-15 LAB — TOXASSURE SELECT,+ANTIDEPR,UR

## 2024-04-18 ENCOUNTER — Other Ambulatory Visit: Payer: Self-pay | Admitting: Registered Nurse

## 2024-05-02 ENCOUNTER — Other Ambulatory Visit: Payer: Self-pay | Admitting: Registered Nurse

## 2024-05-02 DIAGNOSIS — Z5181 Encounter for therapeutic drug level monitoring: Secondary | ICD-10-CM

## 2024-05-02 DIAGNOSIS — G894 Chronic pain syndrome: Secondary | ICD-10-CM

## 2024-05-02 DIAGNOSIS — M542 Cervicalgia: Secondary | ICD-10-CM

## 2024-05-02 DIAGNOSIS — M47817 Spondylosis without myelopathy or radiculopathy, lumbosacral region: Secondary | ICD-10-CM

## 2024-05-02 DIAGNOSIS — G8929 Other chronic pain: Secondary | ICD-10-CM

## 2024-05-02 DIAGNOSIS — G609 Hereditary and idiopathic neuropathy, unspecified: Secondary | ICD-10-CM

## 2024-05-02 DIAGNOSIS — G90512 Complex regional pain syndrome I of left upper limb: Secondary | ICD-10-CM

## 2024-05-02 DIAGNOSIS — Z79899 Other long term (current) drug therapy: Secondary | ICD-10-CM

## 2024-06-17 ENCOUNTER — Encounter: Payer: Self-pay | Admitting: Registered Nurse

## 2024-06-17 ENCOUNTER — Encounter: Attending: Physical Medicine & Rehabilitation | Admitting: Registered Nurse

## 2024-06-17 VITALS — BP 117/86 | HR 87 | Ht 63.0 in

## 2024-06-17 DIAGNOSIS — G609 Hereditary and idiopathic neuropathy, unspecified: Secondary | ICD-10-CM | POA: Diagnosis present

## 2024-06-17 DIAGNOSIS — M62830 Muscle spasm of back: Secondary | ICD-10-CM | POA: Diagnosis present

## 2024-06-17 DIAGNOSIS — M25511 Pain in right shoulder: Secondary | ICD-10-CM | POA: Diagnosis present

## 2024-06-17 DIAGNOSIS — M5412 Radiculopathy, cervical region: Secondary | ICD-10-CM | POA: Insufficient documentation

## 2024-06-17 DIAGNOSIS — G894 Chronic pain syndrome: Secondary | ICD-10-CM | POA: Insufficient documentation

## 2024-06-17 DIAGNOSIS — M546 Pain in thoracic spine: Secondary | ICD-10-CM | POA: Diagnosis present

## 2024-06-17 DIAGNOSIS — Z79899 Other long term (current) drug therapy: Secondary | ICD-10-CM | POA: Insufficient documentation

## 2024-06-17 DIAGNOSIS — Z5181 Encounter for therapeutic drug level monitoring: Secondary | ICD-10-CM | POA: Insufficient documentation

## 2024-06-17 DIAGNOSIS — M25512 Pain in left shoulder: Secondary | ICD-10-CM | POA: Diagnosis present

## 2024-06-17 DIAGNOSIS — G8929 Other chronic pain: Secondary | ICD-10-CM | POA: Insufficient documentation

## 2024-06-17 DIAGNOSIS — M542 Cervicalgia: Secondary | ICD-10-CM | POA: Diagnosis not present

## 2024-06-17 DIAGNOSIS — M2550 Pain in unspecified joint: Secondary | ICD-10-CM | POA: Diagnosis present

## 2024-06-17 MED ORDER — HYDROCODONE-ACETAMINOPHEN 10-325 MG PO TABS: ORAL_TABLET | ORAL | 0 refills | Status: DC

## 2024-06-17 NOTE — Progress Notes (Signed)
 Subjective:    Patient ID: Shannon French, female    DOB: [birth year], 58 y.o.   MRN: [id number]  HPI: Shannon French is a 58 y.o. female who returns for follow up appointment for chronic pain and medication refill. She states her  pain is located in her neck radiating into her left shoulder, , bilateral shoulder, .mid back and bilateral hands and bilateral feet with tingling and burning. She rates her pain 5. Her current exercise regime is walking and performing stretching exercises.  [name] Morphine equivalent is 15.00 MME.   Last UDS was Performed on 04/12/2024, it was consistent.      Pain Inventory Average Pain 5 Pain Right Now 5 My pain is constant, sharp, burning, dull, stabbing, tingling, and aching  In the last 24 hours, has pain interfered with the following? General activity 5 Relation with others 3 Enjoyment of life 1 What TIME of day is your pain at its worst? morning , daytime, evening, and night Sleep (in general) Fair  Pain is worse with: walking, bending, sitting, inactivity, standing, and some activites Pain improves with: rest, heat/ice, therapy/exercise, pacing activities, and medication Relief from Meds: na  Family History  Problem Relation Age of Onset   Heart attack Mother    Cancer Father    Heart attack Brother    Social History   Socioeconomic History   Marital status: Significant Other    Spouse name: Not on file   Number of children: 1   Years of education: Not on file   Highest education level: Not on file  Occupational History   Occupation: Disabled  Tobacco Use   Smoking status: Former    Current packs/day: 0.00    Types: Cigarettes    Start date: 10/15/1999    Quit date: 10/14/2009    Years since quitting: 14.6   Smokeless tobacco: Never  Vaping Use   Vaping status: Never Used  Substance and Sexual Activity   Alcohol  use: No   Drug use: No   Sexual activity: Not on file  Other Topics Concern   Not on file  Social History  Narrative   Not on file   Social Drivers of Health   Financial Resource Strain: Not on file  Food Insecurity: Low Risk  (11/11/2023)   Received from [facility]   Hunger Vital Sign    Within the past 12 months, you worried that your food would run out before you got money to buy more: Never true    Within the past 12 months, the food you bought just didn't last and you didn't have money to get more. : Never true  Transportation Needs: No Transportation Needs (11/11/2023)   Received from [facility]    In the past 12 months, has lack of reliable transportation kept you from medical appointments, meetings, work or from getting things needed for daily living? : No  Physical Activity: Not on file  Stress: Not on file  Social Connections: Not on file   Past Surgical History:  Procedure Laterality Date   ABDOMINAL HYSTERECTOMY     ABDOMINAL SURGERY     APPENDECTOMY     CHOLECYSTECTOMY     CYST EXCISION     OVARY SURGERY     Past Surgical History:  Procedure Laterality Date   ABDOMINAL HYSTERECTOMY     ABDOMINAL SURGERY     APPENDECTOMY     CHOLECYSTECTOMY     CYST EXCISION     OVARY  SURGERY     Past Medical History:  Diagnosis Date   Arm pain    left   Degenerative disc disease    Diabetes mellitus    Fibromyalgia    Hyperlipidemia    Hypertension    IBS (irritable bowel syndrome)    Osteoporosis    BP 117/86   Pulse 87   Ht 5' 3 (1.6 m)   SpO2 97%   BMI 24.09 kg/m   Opioid Risk Score:   Fall Risk Score:  `1  Depression screen PHQ 2/9     06/17/2024    1:23 PM 04/12/2024    9:22 AM 02/18/2024    2:30 PM 08/20/2023    9:33 AM 06/16/2023   10:23 AM 02/24/2023    3:06 PM 12/17/2022    9:24 AM  Depression screen PHQ 2/9  Decreased Interest 0 0 0 0 0 0 0  Down, Depressed, Hopeless 0 0 0 0 0 0 0  PHQ - 2 Score 0 0 0 0 0 0 0  Altered sleeping   0      Tired, decreased energy   0      Change in appetite   0      Feeling bad or failure about  yourself    0      Trouble concentrating   0      Moving slowly or fidgety/restless   0      Suicidal thoughts   0      PHQ-9 Score   0          Data saved with a previous flowsheet row definition    Review of Systems  Musculoskeletal:  Positive for myalgias.  All other systems reviewed and are negative.      Objective:   Physical Exam Vitals and nursing note reviewed.  Constitutional:      Appearance: Normal appearance.  Cardiovascular:     Rate and Rhythm: Normal rate and regular rhythm.     Pulses: Normal pulses.     Heart sounds: Normal heart sounds.  Pulmonary:     Effort: Pulmonary effort is normal.     Breath sounds: Normal breath sounds.  Musculoskeletal:     Comments: Normal Muscle Bulk and Muscle Testing Reveals:  Upper Extremities:Full  ROM and Muscle Strength 5/5 Left AC Joint Tenderness  Thoracic Paraspinal Tenderness: T-4-T-6 Lower Extremities: Full ROM and Muscle Strength 5/5 Arises from table with Ease Narrow Based  Gait     Skin:    General: Skin is warm and dry.  Neurological:     Mental Status: She is oriented to person, place, and time.  Psychiatric:        Mood and Affect: Mood normal.        Behavior: Behavior normal.          Assessment & Plan:  1. Bilateral sacroiliac joint dysfunction: S/P  on 09/19/2022 with good relief noted.. 06/17/2024 R L4 medial branch, R L5 dorsal ramus radiofrequency ablation Right S1,S2,S3, neurolysis under fluoro guidance    S/P Left L4,L5,S1,2,3 Radiofrequency : on 08/08/2022 with good relief noted.           Continue slow weaning : Refilled: HYDROcodone  10/325mg  one half tablet three times a day as needed for pain # 45  We will continue the opioid monitoring program, this consists of regular clinic visits, examinations, urine drug screen, pill counts as well as use of [place]  Controlled Substance Reporting system. A 12 month History  has been reviewed on the [place]  Controlled Substance Reporting  System on 06/17/2024.  2. Chronic pain syndrome consistent with fibromyalgia. Continue with exercise regime, heat therapy,voltaren  gel, lidocaine  patches and topamax . Continue current analgesics . 06/17/2024 3. Lumbosacral Spondylosis/ Bilateral lumbar radiculopathy:Continue Elavil  and Cymbalta . 06/17/2024,  4. Complex Regional Pain Syndrome: Continue Cymbalta . .Dr. Ardeen Following. 06/17/2024 5. Type 2 diabetes.: PCP Following. 06/17/2024 6. Muscle Spasms: Continue Tizanidine  4 mg one tablet every 8 hours as needed for muscle spasms. 06/17/2024 7. Insomnia: Continue Elavil . Continue to monitor. 06/17/2024 8.Cervical Radiculopathy:Continue Elavil : Continue to monitor. 06/17/2024. 9. Cervicalgia/ Cervical Inflammation: Continue current medication regime. Alternate with heat and Ice Therapy.06/17/2024 10. Bilateral Thoracic Pain:Continue HEP as Tolerated.  Continue current medication regime. Continue to Monitor. 06/17/2024 11. Bilateral Shoulder Pain L>R: Continue with HEP as Tolerated. Continue to Alternate Ice and Heat. Continue to monitor. Ortho Following. 06/17/2024  12. Bilateral Hand and Bilateral Feet Neuropathic Pain:  Continue current medication regimen. Continue HEP as tolerated. Continue to monitor. 06/17/2024 13. Polyarthralgia: Continue HEP as Tolerated. Continue to alternate Ice and Heat Therapy. Continue to monitor. 06/17/2024   F/U in 2 months

## 2024-06-25 ENCOUNTER — Other Ambulatory Visit: Payer: Self-pay | Admitting: Cardiovascular Disease

## 2024-07-04 ENCOUNTER — Other Ambulatory Visit: Payer: Self-pay | Admitting: Registered Nurse

## 2024-07-04 DIAGNOSIS — M542 Cervicalgia: Secondary | ICD-10-CM

## 2024-07-04 DIAGNOSIS — G894 Chronic pain syndrome: Secondary | ICD-10-CM

## 2024-07-04 DIAGNOSIS — G609 Hereditary and idiopathic neuropathy, unspecified: Secondary | ICD-10-CM

## 2024-07-09 MED ORDER — AMITRIPTYLINE HCL 50 MG PO TABS: 50.0000 mg | ORAL_TABLET | Freq: Every day | ORAL | 3 refills | Status: AC

## 2024-07-09 NOTE — Addendum Note (Signed)
 Addended by: Brok Stocking W on: 07/09/2024 01:57 PM   Modules accepted: Orders

## 2024-07-25 ENCOUNTER — Other Ambulatory Visit: Payer: Self-pay | Admitting: Cardiovascular Disease

## 2024-07-27 NOTE — Telephone Encounter (Signed)
 Pt of Dr. Barbaraann. Passed 3rd attempt. Does Dr. Barbaraann want to refill? Please advise.

## 2024-08-04 ENCOUNTER — Other Ambulatory Visit: Payer: Self-pay | Admitting: Cardiovascular Disease

## 2024-08-04 LAB — LAB REPORT - SCANNED: EGFR: 107

## 2024-08-17 NOTE — Progress Notes (Unsigned)
 "  Subjective:    Patient ID: Shannon French, female    DOB: 1966/05/10, 59 y.o.   MRN: [id number]  HPI: Shannon French is a 59 y.o. female who returns for follow up appointment for chronic pain and medication refill. She states her pain is located in her neck radiating into her left shoulder, mid- back and left hip pain. She also reports generalized joint pain and tingling and burning in her legs and bilateral feet. She  rates her pain 5.Her  current exercise regime is walking and performing stretching exercises.  Shannon French Morphine equivalent is 15.00 MME.   Oral Swab was Performed today.      Pain Inventory Average Pain 5 Pain Right Now 5 My pain is constant, sharp, burning, dull, stabbing, tingling, and aching  In the last 24 hours, has pain interfered with the following? General activity 5 Relation with others 0 Enjoyment of life 0 What TIME of day is your pain at its worst? morning , daytime, evening, and night Sleep (in general) Fair  Pain is worse with: walking, bending, sitting, inactivity, standing, and some activites Pain improves with: rest, heat/ice, therapy/exercise, pacing activities, and medication Relief from Meds: 5  Family History  Problem Relation Age of Onset   Heart attack Mother    Cancer Father    Heart attack Brother    Social History   Socioeconomic History   Marital status: Significant Other    Spouse name: Not on file   Number of children: 1   Years of education: Not on file   Highest education level: Not on file  Occupational History   Occupation: Disabled  Tobacco Use   Smoking status: Former    Current packs/day: 0.00    Types: Cigarettes    Start date: 10/15/1999    Quit date: 10/14/2009    Years since quitting: 14.8   Smokeless tobacco: Never  Vaping Use   Vaping status: Never Used  Substance and Sexual Activity   Alcohol  use: No   Drug use: No   Sexual activity: Not on file  Other Topics Concern   Not on file  Social History  Narrative   Not on file   Social Drivers of Health   Tobacco Use: Medium Risk (06/17/2024)   Patient History    Smoking Tobacco Use: Former    Smokeless Tobacco Use: Never    Passive Exposure: Not on [place] Strain: Not on file  Food Insecurity: Low Risk (11/11/2023)   Received from [facility]   Epic    Within the past 12 months, you worried that your food would run out before you got money to buy more: Never true    Within the past 12 months, the food you bought just didn't last and you didn't have money to get more. : Never true  Transportation Needs: No Transportation Needs (11/11/2023)   Received from [facility]    In the past 12 months, has lack of reliable transportation kept you from medical appointments, meetings, work or from getting things needed for daily living? : No  Physical Activity: Not on file  Stress: Not on file  Social Connections: Not on file  Depression (PHQ2-9): Low Risk (06/17/2024)   Depression (PHQ2-9)    PHQ-2 Score: 0  Alcohol  Screen: Not on file  Housing: Low Risk (11/11/2023)   Received from [facility]   Epic    What is your living situation today?: I have a steady place  to live    Think about the place you live. Do you have problems with any of the following? Choose all that apply:: None/None on this list  Utilities: Low Risk (11/11/2023)   Received from [facility]   Utilities    In the past 12 months has the electric, gas, oil, or water company threatened to shut off services in your home? : No  Health Literacy: Not on file   Past Surgical History:  Procedure Laterality Date   ABDOMINAL HYSTERECTOMY     ABDOMINAL SURGERY     APPENDECTOMY     CHOLECYSTECTOMY     CYST EXCISION     OVARY SURGERY     Past Surgical History:  Procedure Laterality Date   ABDOMINAL HYSTERECTOMY     ABDOMINAL SURGERY     APPENDECTOMY     CHOLECYSTECTOMY     CYST EXCISION     OVARY SURGERY     Past Medical History:   Diagnosis Date   Arm pain    left   Degenerative disc disease    Diabetes mellitus    Fibromyalgia    Hyperlipidemia    Hypertension    IBS (irritable bowel syndrome)    Osteoporosis    There were no vitals taken for this visit.  Opioid Risk Score:   Fall Risk Score:  `1  Depression screen PHQ 2/9     06/17/2024    1:23 PM 04/12/2024    9:22 AM 02/18/2024    2:30 PM 08/20/2023    9:33 AM 06/16/2023   10:23 AM 02/24/2023    3:06 PM 12/17/2022    9:24 AM  Depression screen PHQ 2/9  Decreased Interest 0 0 0 0 0 0 0  Down, Depressed, Hopeless 0 0 0 0 0 0 0  PHQ - 2 Score 0 0 0 0 0 0 0  Altered sleeping   0      Tired, decreased energy   0      Change in appetite   0      Feeling bad or failure about yourself    0      Trouble concentrating   0      Moving slowly or fidgety/restless   0      Suicidal thoughts   0      PHQ-9 Score   0          Data saved with a previous flowsheet row definition    Review of Systems  Musculoskeletal:  Positive for back pain.       Right hand pain, left shoulder pain,  pain in both legs  All other systems reviewed and are negative.      Objective:   Physical Exam Vitals and nursing note reviewed.  Constitutional:      Appearance: Normal appearance.  Neck:     Comments: Cervical Paraspinal Tenderness: C--5-C-6  Cardiovascular:     Rate and Rhythm: Normal rate and regular rhythm.     Pulses: Normal pulses.     Heart sounds: Normal heart sounds.  Pulmonary:     Effort: Pulmonary effort is normal.     Breath sounds: Normal breath sounds.  Musculoskeletal:     Comments: Normal Muscle Bulk and Muscle Testing Reveals:  Upper Extremities:Full ROM and Muscle Strength 5/5 Left AC Joint Tenderness  Thoracic Paraspinal Tenderness: T-4-T-6  Lumbar Hypersensitivity Lower Extremities: Full ROM and Muscle Strength 5/5 Arises from Chair with ease Narrow Based  Gait  Skin:    General: Skin is warm and dry.  Neurological:     Mental  Status: She is alert and oriented to person, place, and time.  Psychiatric:        Mood and Affect: Mood normal.        Behavior: Behavior normal.          Assessment & Plan:  1. Bilateral sacroiliac joint dysfunction: S/P  on 09/19/2022 with good relief noted.SABRA 08/18/2024 R L4 medial branch, R L5 dorsal ramus radiofrequency ablation Right S1,S2,S3, neurolysis under fluoro guidance    S/P Left L4,L5,S1,2,3 Radiofrequency : on 08/08/2022 with good relief noted.           Continue slow weaning : Refilled: HYDROcodone  10/325mg  one half tablet three times a day as needed for pain # 45  We will continue the opioid monitoring program, this consists of regular clinic visits, examinations, urine drug screen, pill counts as well as use of [place]  Controlled Substance Reporting system. A 12 month History has been reviewed on the [place]  Controlled Substance Reporting System on 08/18/2024.  2. Chronic pain syndrome consistent with fibromyalgia. Continue with exercise regime, heat therapy,voltaren  gel, lidocaine  patches and topamax . Continue current analgesics . 08/18/2024 3. Lumbosacral Spondylosis/ Bilateral lumbar radiculopathy:Continue Elavil  and Cymbalta . 08/18/2024,  4. Complex Regional Pain Syndrome: Continue Cymbalta . .Dr. Ardeen Following. 08/18/2024 5. Type 2 diabetes.: PCP Following. 08/18/2024 6. Muscle Spasms: Continue Tizanidine  4 mg one tablet every 8 hours as needed for muscle spasms. 08/18/2024 7. Insomnia: Continue Elavil . Continue to monitor.08/18/2024 8.Cervical Radiculopathy:Continue Elavil : Continue to monitor. 08/18/2024. 9. Cervicalgia: Continue current medication regime. Alternate with heat and Ice Therapy.08/18/2024 10. Bilateral Thoracic Pain:Continue HEP as Tolerated.  Continue current medication regime. Continue to Monitor. 08/18/2024 11. Left  Shoulder Pain : Continue with HEP as Tolerated. Continue to Alternate Ice and Heat. Continue to monitor. Ortho  Following. 08/18/2024  12. Bilateral Hand and Bilateral Feet Neuropathic Pain:  Continue current medication regimen. Continue HEP as tolerated. Continue to monitor. 08/18/2024 13. Polyarthralgia: Continue HEP as Tolerated. Continue to alternate Ice and Heat Therapy. Continue to monitor. 08/18/2024   F/U in 2 months    "

## 2024-08-18 ENCOUNTER — Encounter: Attending: Physical Medicine & Rehabilitation | Admitting: Registered Nurse

## 2024-08-18 ENCOUNTER — Encounter: Payer: Self-pay | Admitting: Registered Nurse

## 2024-08-18 VITALS — BP 145/85 | HR 81 | Ht 63.0 in | Wt 146.0 lb

## 2024-08-18 DIAGNOSIS — M25512 Pain in left shoulder: Secondary | ICD-10-CM | POA: Insufficient documentation

## 2024-08-18 DIAGNOSIS — G609 Hereditary and idiopathic neuropathy, unspecified: Secondary | ICD-10-CM | POA: Diagnosis not present

## 2024-08-18 DIAGNOSIS — Z5181 Encounter for therapeutic drug level monitoring: Secondary | ICD-10-CM | POA: Diagnosis not present

## 2024-08-18 DIAGNOSIS — G8929 Other chronic pain: Secondary | ICD-10-CM | POA: Diagnosis present

## 2024-08-18 DIAGNOSIS — M5412 Radiculopathy, cervical region: Secondary | ICD-10-CM | POA: Diagnosis not present

## 2024-08-18 DIAGNOSIS — M542 Cervicalgia: Secondary | ICD-10-CM | POA: Diagnosis not present

## 2024-08-18 DIAGNOSIS — M47817 Spondylosis without myelopathy or radiculopathy, lumbosacral region: Secondary | ICD-10-CM | POA: Diagnosis not present

## 2024-08-18 DIAGNOSIS — M546 Pain in thoracic spine: Secondary | ICD-10-CM | POA: Diagnosis not present

## 2024-08-18 DIAGNOSIS — M62830 Muscle spasm of back: Secondary | ICD-10-CM | POA: Insufficient documentation

## 2024-08-18 DIAGNOSIS — Z79899 Other long term (current) drug therapy: Secondary | ICD-10-CM | POA: Diagnosis not present

## 2024-08-18 DIAGNOSIS — M2550 Pain in unspecified joint: Secondary | ICD-10-CM | POA: Diagnosis not present

## 2024-08-18 DIAGNOSIS — G894 Chronic pain syndrome: Secondary | ICD-10-CM | POA: Insufficient documentation

## 2024-08-18 MED ORDER — HYDROCODONE-ACETAMINOPHEN 10-325 MG PO TABS: ORAL_TABLET | ORAL | 0 refills | Status: DC

## 2024-08-24 LAB — DRUG TOX ALC METAB W/CON, ORAL FLD: Alcohol Metabolite: NEGATIVE ng/mL

## 2024-08-24 LAB — DRUG TOX MONITOR 1 W/CONF, ORAL FLD
Amphetamines: NEGATIVE ng/mL
Barbiturates: NEGATIVE ng/mL
Benzodiazepines: NEGATIVE ng/mL
Buprenorphine: NEGATIVE ng/mL
Cocaine: NEGATIVE ng/mL
Codeine: NEGATIVE ng/mL
Cotinine: 58.8 ng/mL — ABNORMAL HIGH
Dihydrocodeine: 3.8 ng/mL — ABNORMAL HIGH
Fentanyl: NEGATIVE ng/mL
Heroin Metabolite: NEGATIVE ng/mL
Hydrocodone: 52.7 ng/mL — ABNORMAL HIGH
Hydromorphone: NEGATIVE ng/mL
MARIJUANA: NEGATIVE ng/mL
MDMA: NEGATIVE ng/mL
Meprobamate: NEGATIVE ng/mL
Methadone: NEGATIVE ng/mL
Morphine: NEGATIVE ng/mL
Nicotine Metabolite: POSITIVE ng/mL — AB
Norhydrocodone: 5.7 ng/mL — ABNORMAL HIGH
Noroxycodone: NEGATIVE ng/mL
Opiates: POSITIVE ng/mL — AB
Oxycodone: NEGATIVE ng/mL
Oxymorphone: NEGATIVE ng/mL
Phencyclidine: NEGATIVE ng/mL
Tapentadol: NEGATIVE ng/mL
Tramadol: NEGATIVE ng/mL
Zolpidem: NEGATIVE ng/mL

## 2024-08-26 ENCOUNTER — Inpatient Hospital Stay (HOSPITAL_COMMUNITY)
Admission: EM | Admit: 2024-08-26 | Discharge: 2024-08-30 | DRG: 866 | Disposition: A | Attending: Internal Medicine | Admitting: Internal Medicine

## 2024-08-26 ENCOUNTER — Encounter (HOSPITAL_COMMUNITY): Payer: Self-pay

## 2024-08-26 ENCOUNTER — Other Ambulatory Visit: Payer: Self-pay

## 2024-08-26 DIAGNOSIS — B029 Zoster without complications: Secondary | ICD-10-CM | POA: Diagnosis present

## 2024-08-26 DIAGNOSIS — L304 Erythema intertrigo: Secondary | ICD-10-CM | POA: Diagnosis present

## 2024-08-26 DIAGNOSIS — Z882 Allergy status to sulfonamides status: Secondary | ICD-10-CM | POA: Diagnosis not present

## 2024-08-26 DIAGNOSIS — Z91030 Bee allergy status: Secondary | ICD-10-CM

## 2024-08-26 DIAGNOSIS — Z888 Allergy status to other drugs, medicaments and biological substances status: Secondary | ICD-10-CM

## 2024-08-26 DIAGNOSIS — L400 Psoriasis vulgaris: Secondary | ICD-10-CM

## 2024-08-26 DIAGNOSIS — M810 Age-related osteoporosis without current pathological fracture: Secondary | ICD-10-CM | POA: Diagnosis present

## 2024-08-26 DIAGNOSIS — Z91041 Radiographic dye allergy status: Secondary | ICD-10-CM | POA: Diagnosis not present

## 2024-08-26 DIAGNOSIS — M542 Cervicalgia: Secondary | ICD-10-CM

## 2024-08-26 DIAGNOSIS — M797 Fibromyalgia: Secondary | ICD-10-CM | POA: Diagnosis present

## 2024-08-26 DIAGNOSIS — Z87891 Personal history of nicotine dependence: Secondary | ICD-10-CM | POA: Diagnosis not present

## 2024-08-26 DIAGNOSIS — K760 Fatty (change of) liver, not elsewhere classified: Secondary | ICD-10-CM | POA: Diagnosis present

## 2024-08-26 DIAGNOSIS — E039 Hypothyroidism, unspecified: Secondary | ICD-10-CM | POA: Diagnosis present

## 2024-08-26 DIAGNOSIS — E785 Hyperlipidemia, unspecified: Secondary | ICD-10-CM | POA: Diagnosis present

## 2024-08-26 DIAGNOSIS — Z7985 Long-term (current) use of injectable non-insulin antidiabetic drugs: Secondary | ICD-10-CM | POA: Diagnosis not present

## 2024-08-26 DIAGNOSIS — B027 Disseminated zoster: Principal | ICD-10-CM | POA: Diagnosis present

## 2024-08-26 DIAGNOSIS — Z8249 Family history of ischemic heart disease and other diseases of the circulatory system: Secondary | ICD-10-CM

## 2024-08-26 DIAGNOSIS — G8929 Other chronic pain: Secondary | ICD-10-CM | POA: Diagnosis present

## 2024-08-26 DIAGNOSIS — Z794 Long term (current) use of insulin: Secondary | ICD-10-CM | POA: Diagnosis not present

## 2024-08-26 DIAGNOSIS — B028 Zoster with other complications: Secondary | ICD-10-CM | POA: Diagnosis not present

## 2024-08-26 DIAGNOSIS — E1165 Type 2 diabetes mellitus with hyperglycemia: Secondary | ICD-10-CM | POA: Diagnosis not present

## 2024-08-26 DIAGNOSIS — Z79899 Other long term (current) drug therapy: Secondary | ICD-10-CM

## 2024-08-26 DIAGNOSIS — Z8619 Personal history of other infectious and parasitic diseases: Secondary | ICD-10-CM

## 2024-08-26 DIAGNOSIS — M47817 Spondylosis without myelopathy or radiculopathy, lumbosacral region: Secondary | ICD-10-CM

## 2024-08-26 DIAGNOSIS — G894 Chronic pain syndrome: Secondary | ICD-10-CM

## 2024-08-26 DIAGNOSIS — I10 Essential (primary) hypertension: Secondary | ICD-10-CM | POA: Diagnosis present

## 2024-08-26 LAB — CBC
HCT: 43.3 % (ref 36.0–46.0)
Hemoglobin: 13.7 g/dL (ref 12.0–15.0)
MCH: 26.9 pg (ref 26.0–34.0)
MCHC: 31.6 g/dL (ref 30.0–36.0)
MCV: 84.9 fL (ref 80.0–100.0)
Platelets: 315 K/uL (ref 150–400)
RBC: 5.1 MIL/uL (ref 3.87–5.11)
RDW: 13.6 % (ref 11.5–15.5)
WBC: 10.7 K/uL — ABNORMAL HIGH (ref 4.0–10.5)
nRBC: 0 % (ref 0.0–0.2)

## 2024-08-26 LAB — COMPREHENSIVE METABOLIC PANEL WITH GFR
ALT: 36 U/L (ref 0–44)
AST: 25 U/L (ref 15–41)
Albumin: 4.5 g/dL (ref 3.5–5.0)
Alkaline Phosphatase: 145 U/L — ABNORMAL HIGH (ref 38–126)
Anion gap: 14 (ref 5–15)
BUN: 15 mg/dL (ref 6–20)
CO2: 22 mmol/L (ref 22–32)
Calcium: 9.4 mg/dL (ref 8.9–10.3)
Chloride: 101 mmol/L (ref 98–111)
Creatinine, Ser: 0.73 mg/dL (ref 0.44–1.00)
GFR, Estimated: >60 mL/min
Glucose, Bld: 191 mg/dL — ABNORMAL HIGH (ref 70–99)
Potassium: 4.2 mmol/L (ref 3.5–5.1)
Sodium: 138 mmol/L (ref 135–145)
Total Bilirubin: <0.2 mg/dL (ref 0.0–1.2)
Total Protein: 7.3 g/dL (ref 6.5–8.1)

## 2024-08-26 LAB — HEMOGLOBIN A1C
Hgb A1c MFr Bld: 7.9 % — ABNORMAL HIGH (ref 4.8–5.6)
Mean Plasma Glucose: 180.03 mg/dL

## 2024-08-26 LAB — CBG MONITORING, ED: Glucose-Capillary: 126 mg/dL — ABNORMAL HIGH (ref 70–99)

## 2024-08-26 MED ORDER — DULOXETINE HCL 30 MG PO CPEP
30.0000 mg | ORAL_CAPSULE | Freq: Every day | ORAL | Status: DC
  Administered 2024-08-26 – 2024-08-29 (×4): 30 mg via ORAL
  Filled 2024-08-26 (×4): qty 1

## 2024-08-26 MED ORDER — PANTOPRAZOLE SODIUM 40 MG PO TBEC
40.0000 mg | DELAYED_RELEASE_TABLET | Freq: Every day | ORAL | Status: DC
  Administered 2024-08-27 – 2024-08-30 (×4): 40 mg via ORAL
  Filled 2024-08-26 (×4): qty 1

## 2024-08-26 MED ORDER — DIPHENHYDRAMINE HCL 25 MG PO CAPS
25.0000 mg | ORAL_CAPSULE | Freq: Once | ORAL | Status: AC
  Administered 2024-08-26: 25 mg via ORAL
  Filled 2024-08-26: qty 1

## 2024-08-26 MED ORDER — AMITRIPTYLINE HCL 50 MG PO TABS
50.0000 mg | ORAL_TABLET | Freq: Every day | ORAL | Status: DC
  Administered 2024-08-26 – 2024-08-29 (×4): 50 mg via ORAL
  Filled 2024-08-26 (×3): qty 1
  Filled 2024-08-26: qty 2

## 2024-08-26 MED ORDER — SODIUM CHLORIDE 0.9% FLUSH
3.0000 mL | Freq: Two times a day (BID) | INTRAVENOUS | Status: DC
  Administered 2024-08-27 – 2024-08-29 (×5): 3 mL via INTRAVENOUS

## 2024-08-26 MED ORDER — EZETIMIBE 10 MG PO TABS
10.0000 mg | ORAL_TABLET | Freq: Every day | ORAL | Status: DC
  Administered 2024-08-27 – 2024-08-30 (×4): 10 mg via ORAL
  Filled 2024-08-26 (×4): qty 1

## 2024-08-26 MED ORDER — INSULIN ASPART 100 UNIT/ML IJ SOLN
0.0000 [IU] | Freq: Three times a day (TID) | INTRAMUSCULAR | Status: DC
  Administered 2024-08-27: 3 [IU] via SUBCUTANEOUS
  Administered 2024-08-27 – 2024-08-28 (×4): 2 [IU] via SUBCUTANEOUS
  Administered 2024-08-29: 5 [IU] via SUBCUTANEOUS
  Administered 2024-08-29: 3 [IU] via SUBCUTANEOUS
  Filled 2024-08-26 (×3): qty 2
  Filled 2024-08-26: qty 3
  Filled 2024-08-26: qty 5
  Filled 2024-08-26: qty 2

## 2024-08-26 MED ORDER — LISINOPRIL 5 MG PO TABS
2.5000 mg | ORAL_TABLET | Freq: Every day | ORAL | Status: DC
  Administered 2024-08-27 – 2024-08-30 (×4): 2.5 mg via ORAL
  Filled 2024-08-26 (×4): qty 1

## 2024-08-26 MED ORDER — CARBAMAZEPINE 200 MG PO TABS
200.0000 mg | ORAL_TABLET | Freq: Two times a day (BID) | ORAL | Status: DC
  Administered 2024-08-27 – 2024-08-30 (×7): 200 mg via ORAL
  Filled 2024-08-26 (×8): qty 1

## 2024-08-26 MED ORDER — ACETAMINOPHEN 325 MG PO TABS: 650.0000 mg | ORAL_TABLET | Freq: Four times a day (QID) | ORAL | Status: DC | PRN

## 2024-08-26 MED ORDER — ACETAMINOPHEN 650 MG RE SUPP: 650.0000 mg | Freq: Four times a day (QID) | RECTAL | Status: DC | PRN

## 2024-08-26 MED ORDER — DEXTROSE 5 % IV SOLN
10.0000 mg/kg | Freq: Three times a day (TID) | INTRAVENOUS | Status: DC
  Administered 2024-08-26 – 2024-08-27 (×3): 650 mg via INTRAVENOUS
  Filled 2024-08-26 (×4): qty 13

## 2024-08-26 MED ORDER — HYDROCODONE-ACETAMINOPHEN 10-325 MG PO TABS
1.0000 | ORAL_TABLET | ORAL | Status: DC | PRN
  Administered 2024-08-26 – 2024-08-29 (×11): 1 via ORAL
  Filled 2024-08-26 (×11): qty 1

## 2024-08-26 MED ORDER — SODIUM CHLORIDE 0.9 % IV SOLN: INTRAVENOUS | Status: AC

## 2024-08-26 MED ORDER — DULOXETINE HCL 60 MG PO CPEP
60.0000 mg | ORAL_CAPSULE | Freq: Every day | ORAL | Status: DC
  Administered 2024-08-27 – 2024-08-30 (×4): 60 mg via ORAL
  Filled 2024-08-26: qty 1
  Filled 2024-08-26: qty 2
  Filled 2024-08-26 (×2): qty 1

## 2024-08-26 MED ORDER — HYDRALAZINE HCL 20 MG/ML IJ SOLN: 5.0000 mg | INTRAMUSCULAR | Status: DC | PRN

## 2024-08-26 MED ORDER — HEPARIN SODIUM (PORCINE) 5000 UNIT/ML IJ SOLN
5000.0000 [IU] | Freq: Two times a day (BID) | INTRAMUSCULAR | Status: DC
  Administered 2024-08-26 – 2024-08-30 (×8): 5000 [IU] via SUBCUTANEOUS
  Filled 2024-08-26 (×8): qty 1

## 2024-08-26 MED ORDER — LACTATED RINGERS IV SOLN: INTRAVENOUS | Status: DC

## 2024-08-26 MED ORDER — ALBUTEROL SULFATE (2.5 MG/3ML) 0.083% IN NEBU: 3.0000 mL | INHALATION_SOLUTION | Freq: Four times a day (QID) | RESPIRATORY_TRACT | Status: DC | PRN

## 2024-08-26 NOTE — ED Triage Notes (Signed)
 Pt sent by doctor for active uncontrolled shingles infection. Pt states she has been on medicine for 10 days but the rash is spreading. Pt states blisters are still open and have not crusted over. Pt advised to wear mask.

## 2024-08-26 NOTE — H&P (Signed)
 " History and Physical    Patient: Shannon French FMW:[id number] DOB: [birth year] DOA: 08/26/2024 DOS: the patient was seen and examined on 08/26/2024 . PCP: Sabas Norleen PARAS., MD  Patient coming from: Home Chief complaint: Chief Complaint  Patient presents with   Herpes Zoster   HPI:  Shannon French is a 59 y.o. female with past medical history  of shingles, diabetes mellitus, hypothyroidism, essential hypertension, hyperlipidemia coming in with rash affecting her neck scalp and upper back, states she knows that it is shingles and its progressed.  Patient also has bilateral inguinal rash patient has that has not improved with over-the-counter topical antifungal.  No other reports of fevers chills nausea vomiting abdominal pain headaches blurred vision speech or gait issues numbness or tingling.  Patient reports the rash started on Wednesday and she was seen by primary care on Thursday and was given Valtrex.  Patient decided to come to the hospital as it was getting worse.  ED Course:  Vital signs in the ED were notable for the following: Vitals showing elevated blood pressure but otherwise stable. Vitals:   08/26/24 1150 08/26/24 1152 08/26/24 1717  BP: (!) 174/114  (!) 167/103  Pulse: (!) 101  85  Temp: 97.7 F (36.5 C)  97.9 F (36.6 C)  Resp: 20  20  Height:  5' 3 (1.6 m)   Weight:  64.9 kg   SpO2: 100%  100%  TempSrc: Oral  Oral  BMI (Calculated):  25.34    >>ED evaluation thus far shows: CMP shows glucose 191 alk phos 145. CBC shows white count of 10.7 otherwise normal.   >>While in the ED patient received the following: Medications  acyclovir  (ZOVIRAX ) 650 mg in dextrose  5 % 100 mL IVPB (has no administration in time range)  lactated ringers  infusion (has no administration in time range)   Review of Systems  Skin:  Positive for itching and rash.       Burning  All other systems reviewed and are negative.  Past Medical History:  Diagnosis Date   Arm pain    left    Degenerative disc disease    Diabetes mellitus    Fibromyalgia    Hyperlipidemia    Hypertension    IBS (irritable bowel syndrome)    Osteoporosis    Past Surgical History:  Procedure Laterality Date   ABDOMINAL HYSTERECTOMY     ABDOMINAL SURGERY     APPENDECTOMY     CHOLECYSTECTOMY     CYST EXCISION     OVARY SURGERY      reports that she quit smoking about 14 years ago. Her smoking use included cigarettes. She started smoking about 24 years ago. She has never used smokeless tobacco. She reports that she does not drink alcohol  and does not use drugs. Allergies[1] Family History  Problem Relation Age of Onset   Heart attack Mother    Cancer Father    Heart attack Brother    Prior to Admission medications  Medication Sig Start Date End Date Taking? Authorizing Provider  ALBUTEROL  IN albuterol  90 mcg-budesonide 80 mcg/actuation HFA aerosol inhaler  Inhale by inhalation route.    [provider]  amitriptyline  (ELAVIL ) 50 MG tablet Take 1 tablet (50 mg total) by mouth at bedtime. 07/09/24   Debby Fidela CROME, NP  carbamazepine  (TEGRETOL ) 200 MG tablet TAKE 1 TABLET BY MOUTH TWICE A DAY 12/02/23   Swartz, Zachary T, MD  clobetasol ointment (TEMOVATE) 0.05 % Apply 1 application topically 2 (two) times  daily.    [provider]  Continuous Blood Gluc Receiver (FREESTYLE LIBRE 2 READER) DEVI SCAN AS NEEDED FOR CONTINUOUS GLUCOSE MONITORING 12/21/21   [provider]  DHEA 25 MG CAPS Take 25 mg by mouth daily.    [provider]  diclofenac  Sodium (VOLTAREN ) 1 % GEL Apply 2 g topically 4 (four) times daily. 01/23/23   [provider]  DULoxetine  (CYMBALTA ) 30 MG capsule TAKE 1 CAPSULE BY MOUTH EVERY DAY 04/19/24   Debby Fidela CROME, NP  DULoxetine  (CYMBALTA ) 60 MG capsule TAKE 1 CAPSULE BY MOUTH EVERY DAY 03/02/24   Debby Fidela CROME, NP  Estradiol [facility]) Insert by vaginal route for 30 days.    [provider]  ezetimibe  (ZETIA ) 10 MG  tablet TAKE 1 TABLET BY MOUTH EVERY DAY 12/09/23   O'Neal, Darryle Debby, MD  folic acid (FOLVITE) 1 MG tablet Take 1 mg by mouth daily.    [provider]  GLUCAGON HCL IJ Glucagon (HCl) Emergency Kit    [provider]  HYDROcodone -acetaminophen  (NORCO) 10-325 MG tablet Take 0.5 tablet three times a day as needed for pain 08/18/24   Debby Fidela CROME, NP  insulin  lispro (HUMALOG) 100 UNIT/ML injection Inject into the skin as needed.    [provider]  LANTUS SOLOSTAR 100 UNIT/ML Solostar Pen Inject 20 Units into the skin every morning. 01/26/15   [provider]  lidocaine  (LIDODERM ) 5 % Place 1 patch onto the skin daily. Remove & Discard patch within 12 hours or as directed by MD 07/26/19   Debby Fidela CROME, NP  lisinopril  (ZESTRIL ) 2.5 MG tablet Take 1 tablet (2.5 mg total) by mouth daily. Please schedule office visit to continue receiving refills. Thank you! Office number: [phone] 07/28/24   Barbaraann Darryle Debby, MD  methimazole (TAPAZOLE) 5 MG tablet Take 5 mg by mouth daily. 11/27/21   [provider]  naloxone  (NARCAN ) nasal spray 4 mg/0.1 mL Lethargic or respiratory depression after opoid use 05/28/22   Debby Fidela CROME, NP  ONE TOUCH ULTRA TEST test strip  07/15/12   [provider]  OZEMPIC, 0.25 OR 0.5 MG/DOSE, 2 MG/3ML SOPN SMARTSIG:0.75 Milliliter(s) SUB-Q Once a Week 02/03/23   [provider]  pantoprazole  (PROTONIX ) 40 MG tablet Take 40 mg by mouth daily.     [provider]  PROAIR  HFA 108 (90 BASE) MCG/ACT inhaler Inhale 2 puffs into the lungs 4 (four) times daily as needed. 01/19/12   Slatosky, Norleen PARAS., MD  Probiotic Product (PROBIOTIC-10 PO) Probiotic    [provider]  promethazine (PHENERGAN) 25 MG tablet Take 25 mg by mouth every 6 (six) hours as needed.    [provider]  REPATHA SURECLICK 140 MG/ML SOAJ Inject into the skin every 14 (fourteen) days. 04/23/23   [provider]   risedronate (ACTONEL) 150 MG tablet Take 150 mg by mouth daily. with water on empty stomach, nothing by mouth or lie down for next 30 minutes.    [provider]  Semaglutide,0.25 or 0.5MG /DOS, (OZEMPIC, 0.25 OR 0.5 MG/DOSE,) 2 MG/1.5ML SOPN Inject 0.25 mg into the skin. [place] weekly    [provider]  tiZANidine  (ZANAFLEX ) 4 MG tablet TAKE 1 TABLET (4 MG TOTAL) BY MOUTH EVERY 8 (EIGHT) HOURS AS NEEDED FOR MUSCLE SPASMS 11/28/23   Debby Fidela CROME, NP  valACYclovir (VALTREX) 1000 MG tablet  09/24/14   [provider]  Vitals:   08/26/24 1150 08/26/24 1152 08/26/24 1717  BP: (!) 174/114  (!) 167/103  Pulse: (!) 101  85  Resp: 20  20  Temp: 97.7 F (36.5 C)  97.9 F (36.6 C)  TempSrc: Oral  Oral  SpO2: 100%  100%  Weight:  64.9 kg   Height:  5' 3 (1.6 m)    Physical Exam Vitals reviewed.  Constitutional:      General: She is not in acute distress.    Appearance: She is not ill-appearing.  HENT:     Head: Normocephalic.  Eyes:     Extraocular Movements: Extraocular movements intact.  Cardiovascular:     Rate and Rhythm: Normal rate and regular rhythm.     Heart sounds: Normal heart sounds.  Pulmonary:     Breath sounds: Normal breath sounds.  Abdominal:     General: There is no distension.     Palpations: Abdomen is soft.     Tenderness: There is no abdominal tenderness.  Musculoskeletal:       Arms:  Skin:    Findings: Rash present.  Neurological:     General: No focal deficit present.     Mental Status: She is alert and oriented to person, place, and time.     Labs on Admission: I have personally reviewed following labs and imaging studies CBC: Recent Labs  Lab 08/26/24 1206  WBC 10.7*  HGB 13.7  HCT 43.3  MCV 84.9  PLT 315   Basic Metabolic Panel: Recent Labs  Lab 08/26/24 1206  NA 138  K 4.2  CL 101  CO2 22  GLUCOSE 191*  BUN 15   CREATININE 0.73  CALCIUM 9.4   GFR: Estimated Creatinine Clearance: 67.8 mL/min (by C-G formula based on SCr of 0.73 mg/dL). Liver Function Tests: Recent Labs  Lab 08/26/24 1206  AST 25  ALT 36  ALKPHOS 145*  BILITOT <0.2  PROT 7.3  ALBUMIN 4.5   No results for input(s): LIPASE, AMYLASE in the last 168 hours. No results for input(s): AMMONIA in the last 168 hours. Recent Labs    08/26/24 1206  BUN 15  CREATININE 0.73    Cardiac Enzymes: No results for input(s): CKTOTAL, CKMB, CKMBINDEX, TROPONINI in the last 168 hours. BNP (last 3 results) No results for input(s): PROBNP in the last 8760 hours. HbA1C: No results for input(s): HGBA1C in the last 72 hours. CBG: No results for input(s): GLUCAP in the last 168 hours. Lipid Profile: No results for input(s): CHOL, HDL, LDLCALC, TRIG, CHOLHDL, LDLDIRECT in the last 72 hours. Thyroid Function Tests: No results for input(s): TSH, T4TOTAL, FREET4, T3FREE, THYROIDAB in the last 72 hours. Anemia Panel: No results for input(s): VITAMINB12, FOLATE, FERRITIN, TIBC, IRON, RETICCTPCT in the last 72 hours. Urine analysis: No results found for: COLORURINE, APPEARANCEUR, LABSPEC, PHURINE, GLUCOSEU, HGBUR, BILIRUBINUR, KETONESUR, PROTEINUR, UROBILINOGEN, NITRITE, LEUKOCYTESUR Radiological Exams on Admission: No results found. Data Reviewed: Relevant notes from primary care and specialist visits, past discharge summaries as available in EHR, including Care Everywhere . Prior diagnostic testing as pertinent to current admission diagnoses, Updated medications and problem lists for reconciliation .ED course, including vitals, labs, imaging, treatment and response to treatment,Triage notes, nursing and pharmacy notes and ED provider's notes.Notable results as noted in HPI.Discussed case with EDMD/ ED APP/ or Specialty MD on call and as needed.  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  Bilateral distribution is concerning for early dissemination disease.  Will admit and start patient on IV antivirals with acyclovir  pharmacy consult. Currently clinically patient is stable and does not have any weakness confusion incontinence or visual complaints. Started on IV acyclovir . Pain control with oral agents. Airborne and contact precautions. Will defer to PCP for zoster vaccine.   Continue with Cymbalta . No rate IV fluids.   >> Diabetes mellitus type 2: Glycemic protocol and carb consistent diet. Continue lisinopril .  >> Hyperlipidemia: Continue Zetia .  DVT prophylaxis:  Heparin  Consults:  None  Advance Care Planning:    Code Status: Full Code   Family Communication:  None Disposition Plan:  Home Severity of Illness: The appropriate patient status for this patient is INPATIENT. Inpatient status is judged to be reasonable and necessary in order to provide the required intensity of service to ensure the patient's safety. The patient's presenting symptoms, physical exam findings, and initial radiographic and laboratory data in the context of their chronic comorbidities is felt to place them at high risk for further clinical deterioration. Furthermore, it is not anticipated that the patient will be medically stable for discharge from the hospital within 2 midnights of admission.   * I certify that at the point of admission it is my clinical judgment that the patient will require inpatient hospital care spanning beyond 2 midnights from the point of admission due to high intensity of service, high risk for further deterioration and high frequency of surveillance required.*  Unresulted Labs (From admission, onward)     Start     Ordered   08/27/24 0500  Comprehensive metabolic panel  Tomorrow morning,   R        08/26/24  1735   08/27/24 0500  CBC  Tomorrow morning,   R        08/26/24 1735   08/26/24 1733  Hemoglobin A1c  Once,   R       Comments: To assess prior glycemic control    08/26/24 1735   08/26/24 1733  HIV Antibody (routine testing w rflx)  (HIV Antibody (Routine testing w reflex) panel)  Once,   R        08/26/24 1735            Meds ordered this encounter  Medications   acyclovir  (ZOVIRAX ) 650 mg in dextrose  5 % 100 mL IVPB   lactated ringers  infusion   amitriptyline  (ELAVIL ) tablet 50 mg   carbamazepine  (TEGRETOL ) tablet 200 mg   DULoxetine  (CYMBALTA ) DR capsule 30 mg   DULoxetine  (CYMBALTA ) DR capsule 60 mg   ezetimibe  (ZETIA ) tablet 10 mg   HYDROcodone -acetaminophen  (NORCO) 10-325 MG per tablet 1 tablet    Refill:  0   lisinopril  (ZESTRIL ) tablet 2.5 mg   albuterol  (PROVENTIL ) (2.5 MG/3ML) 0.083% nebulizer solution 3 mL   pantoprazole  (PROTONIX ) EC tablet 40 mg   insulin  aspart (novoLOG ) injection 0-15 Units    Correction coverage::   Moderate (average weight, post-op)    CBG < 70::   implement hypoglycemia protocol    CBG 70 - 120::   0 units    CBG 121 - 150::   2 units    CBG 151 - 200::   3 units    CBG 201 - 250::   5 units    CBG 251 - 300::   8 units    CBG 301 - 350::   11 units    CBG 351 - 400::   15 units  CBG > 400:   call MD and obtain STAT lab verification   heparin  injection 5,000 Units   sodium chloride  flush (NS) 0.9 % injection 3 mL   0.9 %  sodium chloride  infusion   OR Linked Order Group    acetaminophen  (TYLENOL ) tablet 650 mg    acetaminophen  (TYLENOL ) suppository 650 mg   hydrALAZINE  (APRESOLINE ) injection 5 mg     Orders Placed This Encounter  Procedures   CBC   Comprehensive metabolic panel with GFR   Hemoglobin A1c   HIV Antibody (routine testing w rflx)   Comprehensive metabolic panel   CBC   Diet Carb Modified Room service appropriate? Yes   Apply Diabetes Mellitus Care Plan   STAT CBG when hypoglycemia is suspected. If treated,  recheck every 15 minutes after each treatment until CBG >/= 70 mg/dl   Refer to Hypoglycemia Protocol Sidebar Report for treatment of CBG < 70 mg/dl   No HS correction Insulin    Maintain IV access   Vital signs   Notify physician (specify)   Mobility Protocol: No Restrictions   Refer to Sidebar Report Mobility Protocol for Adult Inpatient   Initiate Adult Central Line Maintenance and Catheter Clearance Protocol for patients with central line (CVC, PICC, Port, Hemodialysis, Trialysis)   Daily weights   Intake and Output   Initiate CHG Protocol for patients in ICU/SD or any patient with a central line or foley catheter   Do not place and if present remove PureWick   Initiate Oral Care Protocol   Initiate Carrier Fluid Protocol   RN may order General Admission PRN Orders utilizing General Admission PRN medications (through manage orders) for the following patient needs: allergy symptoms (Claritin), cold sores (Carmex), cough (Robitussin DM), eye irritation (Liquifilm Tears), hemorrhoids (Tucks), indigestion (Maalox), minor skin irritation (Hydrocortisone Cream), muscle pain (Ben Gay), nose irritation (saline nasal spray) and sore throat (Chloraseptic spray).   Cardiac Monitoring Continuous x 48 hours Indications for use: Other; Other indications for use: DMII   Full code   acyclovir  (ZOVIRAX ) per pharmacy consult   Consult for [facility] Admission   Airborne / Contact [facility]) Isolation   Pulse oximetry check with vital signs   Oxygen therapy Mode or (Route): Nasal cannula; Liters Per Minute: 2; Keep O2 saturation between: greater than 92 %   Admit to Inpatient (patient's expected length of stay will be greater than 2 midnights or inpatient only procedure)   Aspiration precautions    Author: Mario LULLA Blanch, MD 12 pm- 8 pm. [facility]. 08/26/2024 6:16 PM Please note for any communication after hours contact [facility] Assigned provider on call on Amion.       [1]   Allergies Allergen Reactions   Blue Dyes (Parenteral) Anaphylaxis   Gabapentin  [Gabapentin ] Swelling    Suspects allergy was to dye; capsule was yellow   Actos [Pioglitazone Hydrochloride] Other (See Comments) and Hypertension    HEADACHES   Bee Venom Other (See Comments)   Esomeprazole Magnesium Hives   Glucophage [Metformin Hydrochloride] Hives and Swelling   Iodinated Contrast Media     Other reaction(s): Other (See Comments) Unknown   Iohexol  Other (See Comments)   Mepivacaine Hcl Hives and Itching    carbocaine Other reaction(s): Unknown   Morphine Other (See Comments)   Other Other (See Comments)    Uncoded Allergy. Allergen: BICARBOCAINE XRAY DYE - THROAT SWELLS   Pioglitazone Other (See Comments)    Other reaction(s): irregular heart rate, Unknown   Sulfa Antibiotics Hives  Other reaction(s): Unknown   "

## 2024-08-26 NOTE — ED Notes (Signed)
 Followed up with ED pharmacy to ask about ordered acyclovir , as it has not been received from main pharmacy yet. Still awaiting medication.

## 2024-08-26 NOTE — ED Triage Notes (Signed)
 C/O rash on right side back since lat Thursday. Pt states rash has spread to lower back, thighs, and bikini line. Hx of shingles. Pt states PCP told her to come here bc it has spread. Axox4. Pt states been taking Maltrex and Voltaren  gel for 2 days.

## 2024-08-26 NOTE — Progress Notes (Signed)
 Pharmacy Antibiotic Note  Shannon French is a 59 y.o. female for which pharmacy has been consulted for acyclovir  dosing for herpes zoster.  Estimated Creatinine Clearance: 67.8 mL/min (by C-G formula based on SCr of 0.73 mg/dL).   Plan: Acyclovir  10 mg/kg q8h --LR at 125 ml/hr while on IV acyclovir  Monitor WBC, fever, renal function, cultures De-escalate when able  Height: 5' 3 (160 cm) Weight: 64.9 kg (143 lb) IBW/kg (Calculated) : 52.4  Temp (24hrs), Avg:97.7 F (36.5 C), Min:97.7 F (36.5 C), Max:97.7 F (36.5 C)  Recent Labs  Lab 08/26/24 1206  WBC 10.7*  CREATININE 0.73    Estimated Creatinine Clearance: 67.8 mL/min (by C-G formula based on SCr of 0.73 mg/dL).    Allergies[1]  Microbiology results: Pending  Thank you for allowing pharmacy to be a part of this patients care.  Dorn Buttner, PharmD, BCPS 08/26/2024 4:36 PM ED Clinical Pharmacist -  [phone]      [1]  Allergies Allergen Reactions   Blue Dyes (Parenteral) Anaphylaxis   Gabapentin  [Gabapentin ] Swelling    Suspects allergy was to dye; capsule was yellow   Actos [Pioglitazone Hydrochloride] Other (See Comments) and Hypertension    HEADACHES   Bee Venom Other (See Comments)   Esomeprazole Magnesium Hives   Glucophage [Metformin Hydrochloride] Hives and Swelling   Iodinated Contrast Media     Other reaction(s): Other (See Comments) Unknown   Iohexol  Other (See Comments)   Mepivacaine Hcl Hives and Itching    carbocaine Other reaction(s): Unknown   Morphine Other (See Comments)   Other Other (See Comments)    Uncoded Allergy. Allergen: BICARBOCAINE XRAY DYE - THROAT SWELLS   Pioglitazone Other (See Comments)    Other reaction(s): irregular heart rate, Unknown   Sulfa Antibiotics Hives    Other reaction(s): Unknown

## 2024-08-26 NOTE — Progress Notes (Signed)
" ° °  Brief Progress Note   _____________________________________________________________________________________________________________  Patient Name: Shannon French Patient DOB: 1966/04/03 Date: 08/26/24      Data: Patient being admitted with Shingles.    Action: Reached out to Dr Tobie to place precaution order.     Response:  Dr Tobie placed Airborne and Contact precautions order.  _____________________________________________________________________________________________________________  [facility] RN Expeditor Hayzlee Mcsorley Please contact us  directly via secure chat (search for [facility]) or by calling us  at [phone] [facility]).  "

## 2024-08-26 NOTE — ED Notes (Signed)
 ED Provider at bedside.

## 2024-08-26 NOTE — ED Notes (Signed)
 Lenor, MD notified of unsuccessful attempt/difficulty at obtaining IV. Patient not wanting IV start attempt on right arm and states it blows every time so I really don't want it on this side.

## 2024-08-26 NOTE — ED Provider Triage Note (Signed)
 Emergency Medicine Provider Triage Evaluation Note  Shannon French , a 59 y.o. female  was evaluated in triage.  Pt complains of painful to rash to bilateral upper back as well as rash to bil groin creases. Pcp put on valtrex 1 week ago but not getting any better. No other new meds. No change in any other home or personal products. No change in foods. Hx shingles, feels similar.   Review of Systems  Positive: Rash. Negative: Fever,chills.   Physical Exam  BP (!) 174/114 (BP Location: Right Arm)   Pulse (!) 101   Temp 97.7 F (36.5 C) (Oral)   Resp 20   Ht 1.6 m (5' 3)   Wt 64.9 kg   SpO2 100%   BMI 25.33 kg/m  Gen:   Awake, no distress   Resp:  Normal effort  skin:   Sparse mild erythematous rash to upper back. A few vesicular lesions noted. No cellulitis. Rash to bil groin creases appears c/w fungal dermatitis.    Medical Decision Making  Medically screening exam initiated at 12:02 PM.  Appropriate orders placed.  Shannon French was informed that the remainder of the evaluation will be completed by another provider, this initial triage assessment does not replace that evaluation, and the importance of remaining in the ED until their evaluation is complete.  Labs sent.    Bernard Drivers, MD 08/26/24 1204

## 2024-08-26 NOTE — ED Provider Notes (Signed)
 " [place] [facility] Provider Note   CSN: [id number] Arrival date & time: 08/26/24  1145     Patient presents with: Herpes Zoster   Shannon French is a 59 y.o. female who presents today for evaluation of disseminated herpes zoster.  She has a past medical history most notable for diabetes, hypothyroidism, hypertension and hyperlipidemia.  Initially developed symptoms last Wednesday with hyperesthesias along the left neck, trapezius, shoulder.  Subsequent developed an associated rash and presented to PCP, diagnosed with zoster.  Started on Valtrex and topical Voltaren  gel.  Patient has had progression of her symptoms now involving the left side along a similar distribution.  Has also developed bilateral inguinal rashes.  Has been using a topical antifungal without significant improvement.  Patient describes symptoms as a burning stabbing sensation.  Otherwise without any systemic symptoms such as fevers, chills, abdominal pain, nausea or vomiting.  Denies any mucosal involvement or visual changes.  HPI     Prior to Admission medications  Medication Sig Start Date End Date Taking? Authorizing Provider  ALBUTEROL  IN albuterol  90 mcg-budesonide 80 mcg/actuation HFA aerosol inhaler  Inhale by inhalation route.    [provider]  amitriptyline  (ELAVIL ) 50 MG tablet Take 1 tablet (50 mg total) by mouth at bedtime. 07/09/24   Debby Fidela CROME, NP  carbamazepine  (TEGRETOL ) 200 MG tablet TAKE 1 TABLET BY MOUTH TWICE A DAY 12/02/23   Swartz, Zachary T, MD  clobetasol ointment (TEMOVATE) 0.05 % Apply 1 application topically 2 (two) times daily.    [provider]  Continuous Blood Gluc Receiver (FREESTYLE LIBRE 2 READER) DEVI SCAN AS NEEDED FOR CONTINUOUS GLUCOSE MONITORING 12/21/21   [provider]  DHEA 25 MG CAPS Take 25 mg by mouth daily.    [provider]  diclofenac  Sodium (VOLTAREN ) 1 % GEL Apply 2 g topically 4 (four) times  daily. 01/23/23   [provider]  DULoxetine  (CYMBALTA ) 30 MG capsule TAKE 1 CAPSULE BY MOUTH EVERY DAY 04/19/24   Debby Fidela CROME, NP  DULoxetine  (CYMBALTA ) 60 MG capsule TAKE 1 CAPSULE BY MOUTH EVERY DAY 03/02/24   Debby Fidela CROME, NP  Estradiol [facility]) Insert by vaginal route for 30 days.    [provider]  ezetimibe  (ZETIA ) 10 MG tablet TAKE 1 TABLET BY MOUTH EVERY DAY 12/09/23   O'Neal, Darryle Debby, MD  folic acid (FOLVITE) 1 MG tablet Take 1 mg by mouth daily.    [provider]  GLUCAGON HCL IJ Glucagon (HCl) Emergency Kit    [provider]  HYDROcodone -acetaminophen  (NORCO) 10-325 MG tablet Take 0.5 tablet three times a day as needed for pain 08/18/24   Debby Fidela CROME, NP  insulin  lispro (HUMALOG) 100 UNIT/ML injection Inject into the skin as needed.    [provider]  LANTUS SOLOSTAR 100 UNIT/ML Solostar Pen Inject 20 Units into the skin every morning. 01/26/15   [provider]  lidocaine  (LIDODERM ) 5 % Place 1 patch onto the skin daily. Remove & Discard patch within 12 hours or as directed by MD 07/26/19   Debby Fidela CROME, NP  lisinopril  (ZESTRIL ) 2.5 MG tablet Take 1 tablet (2.5 mg total) by mouth daily. Please schedule office visit to continue receiving refills. Thank you! Office number: [phone] 07/28/24   Barbaraann Darryle Debby, MD  methimazole (TAPAZOLE) 5 MG tablet Take 5 mg by mouth daily. 11/27/21   [provider]  naloxone  (NARCAN ) nasal spray 4 mg/0.1 mL Lethargic  or respiratory depression after opoid use 05/28/22   Debby Fidela CROME, NP  ONE TOUCH ULTRA TEST test strip  07/15/12   [provider]  OZEMPIC, 0.25 OR 0.5 MG/DOSE, 2 MG/3ML SOPN SMARTSIG:0.75 Milliliter(s) SUB-Q Once a Week 02/03/23   [provider]  pantoprazole  (PROTONIX ) 40 MG tablet Take 40 mg by mouth daily.     [provider]  PROAIR  HFA 108 (90 BASE) MCG/ACT inhaler Inhale 2 puffs into the lungs 4 (four) times  daily as needed. 01/19/12   Slatosky, Norleen PARAS., MD  Probiotic Product (PROBIOTIC-10 PO) Probiotic    [provider]  promethazine (PHENERGAN) 25 MG tablet Take 25 mg by mouth every 6 (six) hours as needed.    [provider]  REPATHA SURECLICK 140 MG/ML SOAJ Inject into the skin every 14 (fourteen) days. 04/23/23   [provider]  risedronate (ACTONEL) 150 MG tablet Take 150 mg by mouth daily. with water on empty stomach, nothing by mouth or lie down for next 30 minutes.    [provider]  Semaglutide,0.25 or 0.5MG /DOS, (OZEMPIC, 0.25 OR 0.5 MG/DOSE,) 2 MG/1.5ML SOPN Inject 0.25 mg into the skin. [place] weekly    [provider]  tiZANidine  (ZANAFLEX ) 4 MG tablet TAKE 1 TABLET (4 MG TOTAL) BY MOUTH EVERY 8 (EIGHT) HOURS AS NEEDED FOR MUSCLE SPASMS 11/28/23   Debby Fidela CROME, NP  valACYclovir (VALTREX) 1000 MG tablet  09/24/14   [provider]    Allergies: Blue dyes (parenteral), Gabapentin  [gabapentin ], Actos [pioglitazone hydrochloride], Bee venom, Esomeprazole magnesium, Glucophage [metformin hydrochloride], Iodinated contrast media, Iohexol , Mepivacaine hcl, Morphine, Other, Pioglitazone, and Sulfa antibiotics    Review of Systems  Updated Vital Signs BP (!) 174/114 (BP Location: Right Arm)   Pulse (!) 101   Temp 97.7 F (36.5 C) (Oral)   Resp 20   Ht 5' 3 (1.6 m)   Wt 64.9 kg   SpO2 100%   BMI 25.33 kg/m   Physical Exam HENT:     Head: Normocephalic and atraumatic.     Right Ear: External ear normal.     Left Ear: External ear normal.     Nose: Nose normal.     Mouth/Throat:     Mouth: Mucous membranes are moist.  Eyes:     Extraocular Movements: Extraocular movements intact.     Conjunctiva/sclera: Conjunctivae normal.     Pupils: Pupils are equal, round, and reactive to light.  Cardiovascular:     Rate and Rhythm: Regular rhythm. Tachycardia present.     Pulses: Normal pulses.  Pulmonary:     Effort: Pulmonary  effort is normal.  Abdominal:     General: Abdomen is flat. There is no distension.     Palpations: Abdomen is soft.     Tenderness: There is no abdominal tenderness.  Genitourinary:    General: Normal vulva.  Musculoskeletal:        General: Normal range of motion.     Cervical back: Normal range of motion.  Skin:    General: Skin is warm.     Capillary Refill: Capillary refill takes less than 2 seconds.     Findings: Rash present.  Neurological:     General: No focal deficit present.     Mental Status: She is alert and oriented to person, place, and time.     Cranial Nerves: No cranial nerve deficit.     Motor: No weakness.  Psychiatric:        Mood  and Affect: Mood normal.     (all labs ordered are listed, but only abnormal results are displayed) Labs Reviewed  CBC - Abnormal; Notable for the following components:      Result Value   WBC 10.7 (*)    All other components within normal limits  COMPREHENSIVE METABOLIC PANEL WITH GFR - Abnormal; Notable for the following components:   Glucose, Bld 191 (*)    Alkaline Phosphatase 145 (*)    All other components within normal limits    EKG: None  Radiology: No results found.  Procedures   Medications Ordered in the ED  acyclovir  (ZOVIRAX ) 650 mg in dextrose  5 % 100 mL IVPB (has no administration in time range)  lactated ringers  infusion (has no administration in time range)                                  Medical Decision Making Risk Prescription drug management. Decision regarding hospitalization.   Patient is a 59 year old female who presents today for concerns of possible disseminated zoster infection.  On initial assessment patient was noted be mildly tachycardic and hypertensive.  Otherwise afebrile.  Patient is nontoxic-appearing on my initial assessment.  Physical examination notable for erythematous rash with associated hyperesthesias involving bilateral C6-C7 distribution.  There are no clear vesicles  in this area.  No obvious eye or ear involvement.  No mucosal lesions.  Patient also has an erythematous rash involving bilateral inguinal creases.  There is a small area on the right lateral inguinal crease that looks to have some small vesicles.  Otherwise nonfocal neurologic examination.  Patient already had laboratory evaluation at the time my assessment was notable for a mild leukocytosis otherwise unremarkable metabolic panel.  Given that patient has been on outpatient oral therapy and is now having progression of symptoms with rash involving bilateral and multiple dermatomal distributions, will need admission for IV antivirals and concerns for disseminated zoster.  Rash is otherwise not consistent with SJS, TN.  No concerns for anaphylaxis at this point in time.  Patient otherwise is clinically well-appearing and no concerns for CNS involvement.  Patient was provided IV acyclovir  here in the emergency department and admitted to the hospital service for further treatment.   Final diagnoses:  Disseminated herpes zoster    ED Discharge Orders     None          Laurita Sieving, MD 08/26/24 1659    Lenor Hollering, MD 08/26/24 2322  "

## 2024-08-26 NOTE — ED Notes (Signed)
 IV team at bedside

## 2024-08-27 DIAGNOSIS — L400 Psoriasis vulgaris: Secondary | ICD-10-CM

## 2024-08-27 DIAGNOSIS — B028 Zoster with other complications: Secondary | ICD-10-CM | POA: Diagnosis not present

## 2024-08-27 DIAGNOSIS — L304 Erythema intertrigo: Secondary | ICD-10-CM | POA: Diagnosis not present

## 2024-08-27 DIAGNOSIS — G8929 Other chronic pain: Secondary | ICD-10-CM

## 2024-08-27 LAB — GLUCOSE, CAPILLARY
Glucose-Capillary: 149 mg/dL — ABNORMAL HIGH (ref 70–99)
Glucose-Capillary: 182 mg/dL — ABNORMAL HIGH (ref 70–99)

## 2024-08-27 LAB — COMPREHENSIVE METABOLIC PANEL WITH GFR
ALT: 159 U/L — ABNORMAL HIGH (ref 0–44)
AST: 183 U/L — ABNORMAL HIGH (ref 15–41)
Albumin: 4 g/dL (ref 3.5–5.0)
Alkaline Phosphatase: 143 U/L — ABNORMAL HIGH (ref 38–126)
Anion gap: 10 (ref 5–15)
BUN: 12 mg/dL (ref 6–20)
CO2: 24 mmol/L (ref 22–32)
Calcium: 8.8 mg/dL — ABNORMAL LOW (ref 8.9–10.3)
Chloride: 107 mmol/L (ref 98–111)
Creatinine, Ser: 0.67 mg/dL (ref 0.44–1.00)
GFR, Estimated: >60 mL/min
Glucose, Bld: 121 mg/dL — ABNORMAL HIGH (ref 70–99)
Potassium: 3.8 mmol/L (ref 3.5–5.1)
Sodium: 141 mmol/L (ref 135–145)
Total Bilirubin: 0.2 mg/dL (ref 0.0–1.2)
Total Protein: 6 g/dL — ABNORMAL LOW (ref 6.5–8.1)

## 2024-08-27 LAB — CBC
HCT: 38.3 % (ref 36.0–46.0)
Hemoglobin: 12 g/dL (ref 12.0–15.0)
MCH: 26.4 pg (ref 26.0–34.0)
MCHC: 31.3 g/dL (ref 30.0–36.0)
MCV: 84.4 fL (ref 80.0–100.0)
Platelets: 264 K/uL (ref 150–400)
RBC: 4.54 MIL/uL (ref 3.87–5.11)
RDW: 13.9 % (ref 11.5–15.5)
WBC: 8 K/uL (ref 4.0–10.5)
nRBC: 0 % (ref 0.0–0.2)

## 2024-08-27 LAB — HIV ANTIBODY (ROUTINE TESTING W REFLEX): HIV Screen 4th Generation wRfx: NONREACTIVE

## 2024-08-27 LAB — CBG MONITORING, ED
Glucose-Capillary: 117 mg/dL — ABNORMAL HIGH (ref 70–99)
Glucose-Capillary: 179 mg/dL — ABNORMAL HIGH (ref 70–99)

## 2024-08-27 MED ORDER — FLUCONAZOLE 100 MG PO TABS
200.0000 mg | ORAL_TABLET | Freq: Every day | ORAL | Status: DC
  Administered 2024-08-27 – 2024-08-30 (×4): 200 mg via ORAL
  Filled 2024-08-27 (×4): qty 2

## 2024-08-27 MED ORDER — NYSTATIN 100000 UNIT/GM EX POWD
Freq: Three times a day (TID) | CUTANEOUS | Status: DC
  Filled 2024-08-27: qty 15

## 2024-08-27 MED ORDER — HYDROXYZINE HCL 10 MG PO TABS
10.0000 mg | ORAL_TABLET | Freq: Three times a day (TID) | ORAL | Status: DC | PRN
  Administered 2024-08-27 – 2024-08-29 (×6): 10 mg via ORAL
  Filled 2024-08-27 (×6): qty 1

## 2024-08-27 MED ORDER — TIZANIDINE HCL 4 MG PO TABS: 4.0000 mg | ORAL_TABLET | Freq: Four times a day (QID) | ORAL | Status: DC | PRN

## 2024-08-27 MED ORDER — TERBINAFINE HCL 1 % EX CREA
1.0000 | TOPICAL_CREAM | Freq: Two times a day (BID) | CUTANEOUS | Status: DC
  Administered 2024-08-27 – 2024-08-28 (×2): 1 via TOPICAL
  Filled 2024-08-27: qty 12

## 2024-08-27 MED ORDER — DIPHENHYDRAMINE HCL 25 MG PO CAPS
25.0000 mg | ORAL_CAPSULE | Freq: Once | ORAL | Status: AC
  Administered 2024-08-27: 25 mg via ORAL
  Filled 2024-08-27: qty 1

## 2024-08-27 NOTE — Consult Note (Signed)
 "       Date of Admission:  08/26/2024          Reason for Consult: Zoster    Referring Provider: Mennie Lamy, MD   Assessment:  Herpetiform rash in cervical dermatome with intense pain consistent with varicella-zoster, shingles eruption, with possible autoinoculation with 1 lesion on her thigh Intertrigo History of plaque psoriasis not on immunosuppressive therapy Chronic pain   Plan:  Back to Valtrex 1 g 3 times daily to complete 10day of therapy total I have maintained her airborne precautions for now but these can likely be discontinued very soon as there do not appear to be any vesicular lesions and scabbing already appears to be occurring Fluconazole 200 mg daily and topical nystatin  I will sign off for now.  Please call with further questions.    HPI: Shannon French 58 year old woman with a history of diabetes mellitus hypothyroidism hypertension hyperlipidemia plaque psoriasis with prior episode of zoster who developed an herpetiform rash posteriorly starting on the right shoulder spreading to neck into posterior occiput as well as right arm and spreading early across the left back.  He had excruciating pain quite similar to what she experienced with her last episode of zoster which was in the last year.  She was prescribed Valtrex empirically 1 g 3 times daily.  She was directed to be admitted by her primary care physician because she perceived that some of these areas had worsened.   She does have a lesion on her thigh which I expect was autoinoculation from scratching the lesions on her back.  Currently she does not have any vesicles that I could see to unroofed and sent for PCR but her clinical story sounds consistent with zoster.  She clearly has some intertrigo now and this is causing some of her distress as well her pain management also seems to be a chief concern.  I do not think that she needs IV acyclovir  at this point and that we can switch back to  Valtrex.  We will give her systemic fluconazole as well as topical nystatin for the intertrigo.  Pain management will be critical.  It is conceivable that she does not have zoster but a different diagnosis of the pattern and then what level of pain seems consistent with it.  Should her rash worsen I would think this is another pathology.  In that situation ideally she should be seen by a dermatologist.       I personally spent a total of 80 minutes in the care of the patient today including preparing to see the patient, getting/reviewing separately obtained history, performing a medically appropriate exam/evaluation, counseling and educating, placing orders, referring and communicating with other health care professionals, documenting clinical information in the EHR, independently interpreting results, communicating results, and coordinating care.   Evaluation of the patient requires complex antimicrobial therapy evaluation, counseling , isolation needs to reduce disease transmission and risk assessment and mitigation.     Review of Systems: Review of Systems  Constitutional:  Positive for chills. Negative for fever, malaise/fatigue and weight loss.  HENT:  Negative for congestion and sore throat.   Eyes:  Negative for blurred vision and photophobia.  Respiratory:  Negative for cough, shortness of breath and wheezing.   Cardiovascular:  Negative for chest pain, palpitations and leg swelling.  Gastrointestinal:  Negative for abdominal pain, blood in stool, constipation, diarrhea, heartburn, melena, nausea and vomiting.  Genitourinary:  Negative for dysuria, flank pain and hematuria.  Musculoskeletal:  Positive for myalgias. Negative for back pain, falls and joint pain.  Skin:  Negative for itching and rash.  Neurological:  Negative for dizziness, focal weakness, loss of consciousness, weakness and headaches.  Endo/Heme/Allergies:  Does not bruise/bleed easily.  Psychiatric/Behavioral:   Negative for depression and suicidal ideas. The patient does not have insomnia.     Past Medical History:  Diagnosis Date   Arm pain    left   Degenerative disc disease    Diabetes mellitus    Fibromyalgia    Hyperlipidemia    Hypertension    IBS (irritable bowel syndrome)    Osteoporosis     Social History[1]  Family History  Problem Relation Age of Onset   Heart attack Mother    Cancer Father    Heart attack Brother    Allergies[2]  OBJECTIVE: Blood pressure (!) 173/95, pulse 73, temperature (!) 97.5 F (36.4 C), resp. rate 18, height 5' 3 (1.6 m), weight 64.9 kg, SpO2 98%.  Physical Exam Exam conducted with a chaperone present.  Constitutional:      General: She is not in acute distress.    Appearance: Normal appearance. She is well-developed. She is not ill-appearing or diaphoretic.  HENT:     Head: Normocephalic and atraumatic.     Right Ear: Hearing and external ear normal.     Left Ear: Hearing and external ear normal.     Nose: No nasal deformity or rhinorrhea.  Eyes:     General: No scleral icterus.    Conjunctiva/sclera: Conjunctivae normal.     Right eye: Right conjunctiva is not injected.     Left eye: Left conjunctiva is not injected.     Pupils: Pupils are equal, round, and reactive to light.  Neck:     Vascular: No JVD.  Cardiovascular:     Rate and Rhythm: Normal rate and regular rhythm.     Heart sounds: S1 normal and S2 normal.  Pulmonary:     Effort: No respiratory distress.  Abdominal:     General: There is no distension.     Palpations: Abdomen is soft.  Musculoskeletal:        General: Normal range of motion.     Right shoulder: Normal.     Left shoulder: Normal.     Cervical back: Normal range of motion and neck supple.     Right hip: Normal.     Left hip: Normal.     Right knee: Normal.     Left knee: Normal.  Lymphadenopathy:     Head:     Right side of head: No submandibular, preauricular or posterior auricular adenopathy.      Left side of head: No submandibular, preauricular or posterior auricular adenopathy.     Cervical: No cervical adenopathy.     Right cervical: No superficial or deep cervical adenopathy.    Left cervical: No superficial or deep cervical adenopathy.  Skin:    General: Skin is warm and dry.     Coloration: Skin is not pale.     Findings: No abrasion, bruising, ecchymosis, erythema, lesion or rash.     Nails: There is no clubbing.  Neurological:     Mental Status: She is alert and oriented to person, place, and time.     Sensory: No sensory deficit.     Coordination: Coordination normal.     Gait: Gait normal.  Psychiatric:        Attention and Perception: Attention and perception normal. She  is attentive.        Mood and Affect: Mood is anxious and depressed.        Speech: Speech normal.        Behavior: Behavior normal. Behavior is cooperative.        Thought Content: Thought content normal.        Judgment: Judgment normal.    Rash on back, neck         Groin    Lab Results Lab Results  Component Value Date   WBC 8.0 08/27/2024   HGB 12.0 08/27/2024   HCT 38.3 08/27/2024   MCV 84.4 08/27/2024   PLT 264 08/27/2024    Lab Results  Component Value Date   CREATININE 0.67 08/27/2024   BUN 12 08/27/2024   NA 141 08/27/2024   K 3.8 08/27/2024   CL 107 08/27/2024   CO2 24 08/27/2024    Lab Results  Component Value Date   ALT 159 (H) 08/27/2024   AST 183 (H) 08/27/2024   ALKPHOS 143 (H) 08/27/2024   BILITOT 0.2 08/27/2024     Microbiology: No results found for this or any previous visit (from the past 240 hours).  Jomarie Fleeta Rothman, MD [facility] for Infectious Disease [facility] [phone] pager  08/27/2024, 4:17 PM      [1]  Social History Tobacco Use   Smoking status: Former    Current packs/day: 0.00    Types: Cigarettes    Start date: 10/15/1999    Quit date: 10/14/2009    Years since quitting: 14.8   Smokeless  tobacco: Never  Vaping Use   Vaping status: Never Used  Substance Use Topics   Alcohol  use: No   Drug use: No  [2]  Allergies Allergen Reactions   Blue Dyes (Parenteral) Anaphylaxis   Gabapentin  [Gabapentin ] Swelling    Suspects allergy was to dye; capsule was yellow   Actos [Pioglitazone Hydrochloride] Other (See Comments) and Hypertension    HEADACHES   Bee Venom Other (See Comments)   Esomeprazole Magnesium Hives   Glucophage [Metformin Hydrochloride] Hives and Swelling   Iodinated Contrast Media     Other reaction(s): Other (See Comments) Unknown   Iohexol  Other (See Comments)   Mepivacaine Hcl Hives and Itching    carbocaine Other reaction(s): Unknown   Morphine Other (See Comments)   Other Other (See Comments)    Uncoded Allergy. Allergen: BICARBOCAINE XRAY DYE - THROAT SWELLS   Pioglitazone Other (See Comments)    Other reaction(s): irregular heart rate, Unknown   Sulfa Antibiotics Hives    Other reaction(s): Unknown   "

## 2024-08-27 NOTE — Progress Notes (Signed)
 [facility] night cross cover note:   I was notified by the patient's RN that the patient is complaining of pruritus, and is not yet eligible for her next dose of prn Atarax .  I subsequently placed a one-time order for oral Benadryl  to help bridge the gap until she is eligible for her next dose of Atarax .    Eva Pore, DO Hospitalist

## 2024-08-27 NOTE — ED Notes (Signed)
 Ice pack and cup of ice given to pt

## 2024-08-27 NOTE — Hospital Course (Addendum)
 Shannon French is a 59 y.o. female with PMH of shingles, diabetes,hypothyroidism, essential hypertension, hyperlipidemia presenting with worsening rash despite starting Valtrex  from PCP.  Reports rash started on Wednesday and was seen by PCP on Thursday and given Valtrex  . Her rash is affecting her neck scalp and upper back. She felt this as her shingles and it has progressed. Patient also has bilateral inguinal rash patient has that has not improved with over-the-counter topical antifungal.  No other reports of fevers chills nausea vomiting abdominal pain headaches blurred vision speech or gait issues numbness or tingling.  Patient reports the rash started on Wednesday and she was seen by primary care on Thursday and was given Valtrex   Patient decided to come to the hospital as it was getting worse. In the ED vitals stable, hypertensive, stable electrolytes normal LFTs, A1c 7.9. Patient was felt to have multi dermatomal herpes zoster, placed in airborne contact precaution, IV acyclovir  and admitted for further management. Further examination in the morning shows crusting improving rash seen by ID and subsequently transition IV acyclovir  to oral Valtrex .  Subjective: Seen and examined Complains of ongoing itching and burning and pain Rash crusting, Overnight remains afebrile BP stable on room air Labs overall stable AST downtrending183>150, ALT 159>270 further up, T. bili normal alk phos 180, CBC stable  Assessment and plan:  Herpetiform rash since cervical dermatome with intense pain consistent with varicella-zoster possible autoinoculation with 1 lesion on her thigh:  Rash appears crusting improving, ID input appreciated advised to complete Valtrex  1 g 3 times daily x 10 days Continue pain management, continue airborne precaution for now but can likely be discontinued soon Continue multimodal pain management, Cymbalta , continue, standard precautions  Intertrigo: Added Diflucan  daily and topical  nystatin   T2DM W/ Uncontrolled hyperglycemia: A1c stable.  PTA on Lantus  30 units and SSI, Ozempic, currently well-controlled, continue sliding scale insulin   Recent Labs  Lab 08/26/24 1206 08/26/24 2233 08/27/24 1120 08/27/24 1711 08/27/24 2058 08/28/24 0721 08/28/24 1115  GLUCAP  --    < > 179* 149* 182* 140* 142*  HGBA1C 7.9*  --   --   --   --   --   --    < > = values in this interval not displayed.   Transaminitis: LFTs bumped since admission, T. bili normal ALT remains elevated although AST slightly better, checking acute viral diabetes panel and right upper quadrant ultrasound.  History of cholecystectomy.Patient with no obvious right upper quadrant tenderness and not on a statin. Trend labs. Recent Labs  Lab 08/26/24 1206 08/27/24 0433 08/28/24 0657  AST 25 183* 150*  ALT 36 159* 270*  ALKPHOS 145* 143* 180*  BILITOT <0.2 0.2 <0.2  PROT 7.3 6.0* 6.1*  ALBUMIN 4.5 4.0 3.9  PLT 315 264 252     Computed MELD 3.0 unavailable. One or more values for this score either were not found within the given timeframe or did not fit some other criterion. Computed MELD-Na unavailable. One or more values for this score either were not found within the given timeframe or did not fit some other criterion.  History of plaque psoriasis: not on immunosuppression.  Hypertension : BP fairly stable continue lisinopril .    Hyperlipidemia: Continue Zetia .  On Repatha outpatient.  Chronic pain PTA on on amitriptyline , Tegretol  Cymbalta , tizanidine . cont same  DVT prophylaxis: heparin  injection 5,000 Units Start: 08/26/24 2200 Code Status:   Code Status: Full Code Family Communication: plan of care discussed with patient and her husband at  bedside. Patient status is: Remains hospitalized because of severity of illness Level of care: Telemetry   Dispo: The patient is from: home            Anticipated disposition: TBD Objective: Vitals last 24 hrs: Vitals:   08/27/24 2029  08/28/24 0434 08/28/24 0500 08/28/24 0719  BP: (!) 167/78 (!) 142/98  (!) 156/93  Pulse: 79 72  75  Resp: 19 18  16   Temp: 97.7 F (36.5 C) 97.9 F (36.6 C)  97.9 F (36.6 C)  TempSrc:      SpO2: 98% 99%  98%  Weight:   65.3 kg   Height:        Physical Examination: General exam: AAOX3 HEENT:Oral mucosa moist, Ear/Nose WNL grossly Respiratory system: Bilaterally clear BS,no use of accessory muscle Cardiovascular system: S1 & S2 +, No JVD. Gastrointestinal system: Abdomen soft,NT,ND, BS+ Nervous System: Alert, awake, moving all extremities,and following commands. Extremities: extremities warm, leg edema Skin: Warm, crusting vesicular rash on the bilateral upper cervical area and right thigh, intertrigo in bilateral groin MSK: Normal muscle bulk,tone, power   Medications reviewed:  Scheduled Meds:  amitriptyline   50 mg Oral QHS   carbamazepine   200 mg Oral BID   DULoxetine   30 mg Oral QHS   DULoxetine   60 mg Oral Daily   ezetimibe   10 mg Oral Daily   fluconazole   200 mg Oral Daily   heparin   5,000 Units Subcutaneous Q12H   insulin  aspart  0-15 Units Subcutaneous TID WC   lisinopril   2.5 mg Oral Daily   nystatin    Topical TID   pantoprazole   40 mg Oral Daily   sodium chloride  flush  3 mL Intravenous Q12H   valACYclovir   1,000 mg Oral TID   Continuous Infusions:  lactated ringers  125 mL/hr at 08/28/24 0256   Diet: Diet Order             Diet Carb Modified Room service appropriate? Yes  Diet effective now

## 2024-08-27 NOTE — Progress Notes (Signed)
 Pt is c/o itching and it is too soon for Atarax . RN messaged MD on call to see if there is anything pt can have for in between doses.   Shannon French

## 2024-08-27 NOTE — Progress Notes (Signed)
 " PROGRESS NOTE Shannon French  FMW:[id number] DOB: Oct 01, 1965 DOA: 08/26/2024 PCP: Sabas Norleen PARAS., MD  Brief Narrative/Hospital Course: Shannon French is a 59 y.o. female with PMH of shingles, diabetes,hypothyroidism, essential hypertension, hyperlipidemia presenting with worsening rash despite starting Valtrex  from PCP.  Reports rash started on Wednesday and was seen by PCP on Thursday and given Valtrex  . Her rash is affecting her neck scalp and upper back. She felt this as her shingles and it has progressed. Patient also has bilateral inguinal rash patient has that has not improved with over-the-counter topical antifungal.  No other reports of fevers chills nausea vomiting abdominal pain headaches blurred vision speech or gait issues numbness or tingling.  Patient reports the rash started on Wednesday and she was seen by primary care on Thursday and was given Valtrex   Patient decided to come to the hospital as it was getting worse. In the ED vitals stable, hypertensive, stable electrolytes normal LFTs, A1c 7.9. Patient was felt to have disseminated herpes zoster, placed in airborne contact precaution, IV acyclovir  and admitted for further management.  Subjective: Seen and examined today Patient has been burning sensation on upper back and lower back She has a vesicular rash that seems to have been crusting on bilateral upper cervical area, some on scalp Overnight  stable vitals,afebrile, VSS, Labs transaminits  Assessment and plan:  Worsening rash with history of shingles,suspecting multi dermatomal herpes zoster on admission: Patient was recently seen by PCP placed on Valtrex  without improvement. Rash involving bilateral upper cervical dermatomes scalp-crusting.  Unusual for shingles to bilateral. Will consult infectious disease, Cont on IV acyclovir  with IV fluid hydration.   Continue multimodal pain management, Cymbalta , continue, standard precautions  Type 2 diabetes mellitus with  uncontrolled hyperglycemia: A1c stable.  PTA on Lantus  30 units ** and SSI, Ozempic continue sliding scale insulin   Recent Labs  Lab 08/26/24 1206 08/26/24 2233 08/27/24 0752 08/27/24 1120  GLUCAP  --  126* 117* 179*  HGBA1C 7.9*  --   --   --    Bilateral groin rash likely fungal infection: Add antifungal agent.  Hypertension : BP hypertensive continue lisinopril .    Hyperlipidemia: Continue Zetia .  On Repatha outpatient.  PTA on on amitriptyline , Tegretol  Cymbalta : cont same  DVT prophylaxis: heparin  injection 5,000 Units Start: 08/26/24 2200 Code Status:   Code Status: Full Code Family Communication: plan of care discussed with patient and her husband at bedside. Patient status is: Remains hospitalized because of severity of illness Level of care: Telemetry   Dispo: The patient is from: home            Anticipated disposition: TBD Objective: Vitals last 24 hrs: Vitals:   08/27/24 0632 08/27/24 0900 08/27/24 1000 08/27/24 1020  BP: 131/82 (!) 151/91 (!) 148/104   Pulse: 75 80 85   Resp: 18 14 11    Temp: 97.8 F (36.6 C)   98.7 F (37.1 C)  TempSrc: Oral   Oral  SpO2: 97% 93% 95%   Weight:      Height:        Physical Examination: General exam: alert awake, oriented HEENT:Oral mucosa moist, Ear/Nose WNL grossly Respiratory system: Bilaterally clear BS,no use of accessory muscle Cardiovascular system: S1 & S2 +, No JVD. Gastrointestinal system: Abdomen soft,NT,ND, BS+ Nervous System: Alert, awake, moving all extremities,and following commands. Extremities: extremities warm, leg edema Skin: Warm, vesicular rash that seems to have been crusting on bilateral upper cervical area, some on scalp, bilateral rash on the groin erythematous  hyperpigmented no vesicles. MSK: Normal muscle bulk,tone, power   Medications reviewed:  Scheduled Meds:  amitriptyline   50 mg Oral QHS   carbamazepine   200 mg Oral BID   DULoxetine   30 mg Oral QHS   DULoxetine   60 mg Oral  Daily   ezetimibe   10 mg Oral Daily   heparin   5,000 Units Subcutaneous Q12H   insulin  aspart  0-15 Units Subcutaneous TID WC   lisinopril   2.5 mg Oral Daily   pantoprazole   40 mg Oral Daily   sodium chloride  flush  3 mL Intravenous Q12H   Continuous Infusions:  sodium chloride      acyclovir  Stopped (08/27/24 0925)   lactated ringers  125 mL/hr at 08/27/24 9677   Diet: Diet Order             Diet Carb Modified Room service appropriate? Yes  Diet effective now                    Unresulted Labs (From admission, onward)     Start     Ordered   08/26/24 1733  HIV Antibody (routine testing w rflx)  (HIV Antibody (Routine testing w reflex) panel)  Once,   R        08/26/24 1735           Data Reviewed: I have personally reviewed following labs and imaging studies ( see epic result tab) CBC: Recent Labs  Lab 08/26/24 1206 08/27/24 0433  WBC 10.7* 8.0  HGB 13.7 12.0  HCT 43.3 38.3  MCV 84.9 84.4  PLT 315 264   CMP: Recent Labs  Lab 08/26/24 1206 08/27/24 0433  NA 138 141  K 4.2 3.8  CL 101 107  CO2 22 24  GLUCOSE 191* 121*  BUN 15 12  CREATININE 0.73 0.67  CALCIUM 9.4 8.8*   GFR: Estimated Creatinine Clearance: 67.8 mL/min (by C-G formula based on SCr of 0.67 mg/dL). Recent Labs  Lab 08/26/24 1206 08/27/24 0433  AST 25 183*  ALT 36 159*  ALKPHOS 145* 143*  BILITOT <0.2 0.2  PROT 7.3 6.0*  ALBUMIN 4.5 4.0   No results for input(s): LIPASE, AMYLASE in the last 168 hours. No results for input(s): AMMONIA in the last 168 hours. Coagulation Profile: No results for input(s): INR, PROTIME in the last 168 hours. Antimicrobials/Microbiology: Anti-infectives (From admission, onward)    Start     Dose/Rate Route Frequency Ordered Stop   08/26/24 1700  acyclovir  (ZOVIRAX ) 650 mg in dextrose  5 % 100 mL IVPB        10 mg/kg  64.9 kg 113 mL/hr over 60 Minutes Intravenous Every 8 hours 08/26/24 1635        No results found for: SDES,  SPECREQUEST, CULT, REPTSTATUS  Procedures:    Mennie LAMY, MD [facility] 08/27/2024, 11:32 AM   "

## 2024-08-28 ENCOUNTER — Inpatient Hospital Stay (HOSPITAL_COMMUNITY)

## 2024-08-28 DIAGNOSIS — B028 Zoster with other complications: Secondary | ICD-10-CM | POA: Diagnosis not present

## 2024-08-28 LAB — CBC
HCT: 38.2 % (ref 36.0–46.0)
Hemoglobin: 12.1 g/dL (ref 12.0–15.0)
MCH: 26.7 pg (ref 26.0–34.0)
MCHC: 31.7 g/dL (ref 30.0–36.0)
MCV: 84.1 fL (ref 80.0–100.0)
Platelets: 252 10*3/uL (ref 150–400)
RBC: 4.54 MIL/uL (ref 3.87–5.11)
RDW: 13.9 % (ref 11.5–15.5)
WBC: 5.2 10*3/uL (ref 4.0–10.5)
nRBC: 0 % (ref 0.0–0.2)

## 2024-08-28 LAB — COMPREHENSIVE METABOLIC PANEL WITH GFR
ALT: 270 U/L — ABNORMAL HIGH (ref 0–44)
AST: 150 U/L — ABNORMAL HIGH (ref 15–41)
Albumin: 3.9 g/dL (ref 3.5–5.0)
Alkaline Phosphatase: 180 U/L — ABNORMAL HIGH (ref 38–126)
Anion gap: 8 (ref 5–15)
BUN: 8 mg/dL (ref 6–20)
CO2: 25 mmol/L (ref 22–32)
Calcium: 9 mg/dL (ref 8.9–10.3)
Chloride: 106 mmol/L (ref 98–111)
Creatinine, Ser: 0.61 mg/dL (ref 0.44–1.00)
GFR, Estimated: >60 mL/min
Glucose, Bld: 119 mg/dL — ABNORMAL HIGH (ref 70–99)
Potassium: 4 mmol/L (ref 3.5–5.1)
Sodium: 140 mmol/L (ref 135–145)
Total Bilirubin: <0.2 mg/dL (ref 0.0–1.2)
Total Protein: 6.1 g/dL — ABNORMAL LOW (ref 6.5–8.1)

## 2024-08-28 LAB — GLUCOSE, CAPILLARY
Glucose-Capillary: 140 mg/dL — ABNORMAL HIGH (ref 70–99)
Glucose-Capillary: 142 mg/dL — ABNORMAL HIGH (ref 70–99)
Glucose-Capillary: 144 mg/dL — ABNORMAL HIGH (ref 70–99)
Glucose-Capillary: 187 mg/dL — ABNORMAL HIGH (ref 70–99)

## 2024-08-28 LAB — HEPATITIS PANEL, ACUTE
HCV Ab: NONREACTIVE
Hep A IgM: NONREACTIVE
Hep B C IgM: NONREACTIVE
Hepatitis B Surface Ag: NONREACTIVE

## 2024-08-28 MED ORDER — INSULIN GLARGINE 100 UNIT/ML ~~LOC~~ SOLN
30.0000 [IU] | Freq: Every day | SUBCUTANEOUS | Status: DC
  Administered 2024-08-28 – 2024-08-29 (×2): 30 [IU] via SUBCUTANEOUS
  Filled 2024-08-28 (×3): qty 0.3

## 2024-08-28 MED ORDER — INSULIN GLARGINE 100 UNIT/ML ~~LOC~~ SOLN
5.0000 [IU] | Freq: Every day | SUBCUTANEOUS | Status: DC
  Filled 2024-08-28: qty 0.05

## 2024-08-28 MED ORDER — DIPHENHYDRAMINE HCL 25 MG PO CAPS
25.0000 mg | ORAL_CAPSULE | Freq: Once | ORAL | Status: AC
  Administered 2024-08-28: 25 mg via ORAL
  Filled 2024-08-28: qty 1

## 2024-08-28 MED ORDER — VALACYCLOVIR HCL 500 MG PO TABS
1000.0000 mg | ORAL_TABLET | Freq: Three times a day (TID) | ORAL | Status: DC
  Administered 2024-08-28 – 2024-08-30 (×7): 1000 mg via ORAL
  Filled 2024-08-28 (×7): qty 2

## 2024-08-28 MED ORDER — DIPHENHYDRAMINE HCL 25 MG PO CAPS
25.0000 mg | ORAL_CAPSULE | Freq: Four times a day (QID) | ORAL | Status: DC | PRN
  Administered 2024-08-28 – 2024-08-29 (×3): 25 mg via ORAL
  Filled 2024-08-28 (×3): qty 1

## 2024-08-28 NOTE — Progress Notes (Signed)
 [facility] night cross cover note:   I was notified by the patient's RN that the patient is refusing her current order for Lantus  5 units subcu daily, with the patient instead requesting resumption of her home Lantus  30 units SQ daily.  This is in the setting of a history of type 2 diabetes mellitus, with most recent hemoglobin A1c 7.9% when checked on 08/26/2024, corresponding to an average blood sugar of nearly 200 while on the 30 daily units of Lantus . Relative to this baseline she cites concern for relative hyperglycemia due to physiologic stress from her presenting varicella zoster rash.  It is noted that she is not on any systemic corticosteroids for management of the latter at this time.  Per brief chart review, her CBG results throughout the day have been in the range of the 140s to 180s, with most recent CBG 187 this evening.  Additionally, per chart review, no documentation of hypoglycemic CBG results throughout her hospital course thus far.  Per patient's request, I subsequently discontinued existing order for Lantus  5 units subcu daily and resumed her home Lantus  30 units SQ daily, with next dose to occur now. Will closely monitor ensuing glycemic trend, including close monitoring for ensuing development of fasting morning hypoglycemia.      Eva Pore, DO Hospitalist

## 2024-08-28 NOTE — Progress Notes (Signed)
 [facility] night cross cover note:  Per patient's request, I have added an order for as needed Benadryl  for pruritus refractory to hydroxyzine .    Eva Pore, DO Hospitalist

## 2024-08-28 NOTE — Progress Notes (Signed)
 " PROGRESS NOTE Shannon French  FMW:[id number] DOB: October 01, 1965 DOA: 08/26/2024 PCP: Sabas Norleen PARAS., MD  Brief Narrative/Hospital Course: Shannon French is a 59 y.o. female with PMH of shingles, diabetes,hypothyroidism, essential hypertension, hyperlipidemia presenting with worsening rash despite starting Valtrex  from PCP.  Reports rash started on Wednesday and was seen by PCP on Thursday and given Valtrex  . Her rash is affecting her neck scalp and upper back. She felt this as her shingles and it has progressed. Patient also has bilateral inguinal rash patient has that has not improved with over-the-counter topical antifungal.  No other reports of fevers chills nausea vomiting abdominal pain headaches blurred vision speech or gait issues numbness or tingling.  Patient reports the rash started on Wednesday and she was seen by primary care on Thursday and was given Valtrex   Patient decided to come to the hospital as it was getting worse. In the ED vitals stable, hypertensive, stable electrolytes normal LFTs, A1c 7.9. Patient was felt to have multi dermatomal herpes zoster, placed in airborne contact precaution, IV acyclovir  and admitted for further management. Further examination in the morning shows crusting improving rash seen by ID and subsequently transition IV acyclovir  to oral Valtrex .  Subjective: Seen and examined Complains of ongoing itching and burning and pain Rash crusting, Overnight remains afebrile BP stable on room air Labs overall stable AST downtrending183>150, ALT 159>270 further up, T. bili normal alk phos 180, CBC stable  Assessment and plan:  Herpetiform rash since cervical dermatome with intense pain consistent with varicella-zoster possible autoinoculation with 1 lesion on her thigh:  Rash appears crusting improving, ID input appreciated advised to complete Valtrex  1 g 3 times daily x 10 days Continue pain management, continue airborne precaution for now but can likely be  discontinued soon Continue multimodal pain management, Cymbalta , continue, standard precautions  Intertrigo: Added Diflucan  daily and topical nystatin   T2DM W/ Uncontrolled hyperglycemia: A1c stable.  PTA on Lantus  30 units and SSI, Ozempic, currently well-controlled, continue sliding scale insulin   Recent Labs  Lab 08/26/24 1206 08/26/24 2233 08/27/24 1120 08/27/24 1711 08/27/24 2058 08/28/24 0721 08/28/24 1115  GLUCAP  --    < > 179* 149* 182* 140* 142*  HGBA1C 7.9*  --   --   --   --   --   --    < > = values in this interval not displayed.   Transaminitis: LFTs bumped since admission, T. bili normal ALT remains elevated although AST slightly better, checking acute viral diabetes panel and right upper quadrant ultrasound.  History of cholecystectomy.Patient with no obvious right upper quadrant tenderness and not on a statin. Trend labs. Recent Labs  Lab 08/26/24 1206 08/27/24 0433 08/28/24 0657  AST 25 183* 150*  ALT 36 159* 270*  ALKPHOS 145* 143* 180*  BILITOT <0.2 0.2 <0.2  PROT 7.3 6.0* 6.1*  ALBUMIN 4.5 4.0 3.9  PLT 315 264 252     Computed MELD 3.0 unavailable. One or more values for this score either were not found within the given timeframe or did not fit some other criterion. Computed MELD-Na unavailable. One or more values for this score either were not found within the given timeframe or did not fit some other criterion.  History of plaque psoriasis: not on immunosuppression.  Hypertension : BP fairly stable continue lisinopril .    Hyperlipidemia: Continue Zetia .  On Repatha outpatient.  Chronic pain PTA on on amitriptyline , Tegretol  Cymbalta , tizanidine . cont same  DVT prophylaxis: heparin  injection 5,000 Units Start: 08/26/24 2200  Code Status:   Code Status: Full Code Family Communication: plan of care discussed with patient and her husband at bedside. Patient status is: Remains hospitalized because of severity of illness Level of care:  Telemetry   Dispo: The patient is from: home            Anticipated disposition: TBD Objective: Vitals last 24 hrs: Vitals:   08/27/24 2029 08/28/24 0434 08/28/24 0500 08/28/24 0719  BP: (!) 167/78 (!) 142/98  (!) 156/93  Pulse: 79 72  75  Resp: 19 18  16   Temp: 97.7 F (36.5 C) 97.9 F (36.6 C)  97.9 F (36.6 C)  TempSrc:      SpO2: 98% 99%  98%  Weight:   65.3 kg   Height:        Physical Examination: General exam: AAOX3 HEENT:Oral mucosa moist, Ear/Nose WNL grossly Respiratory system: Bilaterally clear BS,no use of accessory muscle Cardiovascular system: S1 & S2 +, No JVD. Gastrointestinal system: Abdomen soft,NT,ND, BS+ Nervous System: Alert, awake, moving all extremities,and following commands. Extremities: extremities warm, leg edema Skin: Warm, crusting vesicular rash on the bilateral upper cervical area and right thigh, intertrigo in bilateral groin MSK: Normal muscle bulk,tone, power   Medications reviewed:  Scheduled Meds:  amitriptyline   50 mg Oral QHS   carbamazepine   200 mg Oral BID   DULoxetine   30 mg Oral QHS   DULoxetine   60 mg Oral Daily   ezetimibe   10 mg Oral Daily   fluconazole   200 mg Oral Daily   heparin   5,000 Units Subcutaneous Q12H   insulin  aspart  0-15 Units Subcutaneous TID WC   lisinopril   2.5 mg Oral Daily   nystatin    Topical TID   pantoprazole   40 mg Oral Daily   sodium chloride  flush  3 mL Intravenous Q12H   valACYclovir   1,000 mg Oral TID   Continuous Infusions:  lactated ringers  125 mL/hr at 08/28/24 0256   Diet: Diet Order             Diet Carb Modified Room service appropriate? Yes  Diet effective now                    Unresulted Labs (From admission, onward)     Start     Ordered   08/29/24 0500  Comprehensive metabolic panel with GFR  Tomorrow morning,   R       Question:  Specimen collection method  Answer:  Lab=Lab collect   08/28/24 0918   08/28/24 0914  Hepatitis panel, acute  Add-on,   AD        Question:  Specimen collection method  Answer:  Lab=Lab collect   08/28/24 0913           Data Reviewed: I have personally reviewed following labs and imaging studies ( see epic result tab) CBC: Recent Labs  Lab 08/26/24 1206 08/27/24 0433 08/28/24 0657  WBC 10.7* 8.0 5.2  HGB 13.7 12.0 12.1  HCT 43.3 38.3 38.2  MCV 84.9 84.4 84.1  PLT 315 264 252   CMP: Recent Labs  Lab 08/26/24 1206 08/27/24 0433 08/28/24 0657  NA 138 141 140  K 4.2 3.8 4.0  CL 101 107 106  CO2 22 24 25   GLUCOSE 191* 121* 119*  BUN 15 12 8   CREATININE 0.73 0.67 0.61  CALCIUM 9.4 8.8* 9.0   GFR: Estimated Creatinine Clearance: 68 mL/min (by C-G formula based on SCr of 0.61 mg/dL). Recent Labs  Lab 08/26/24 1206 08/27/24 0433 08/28/24 0657  AST 25 183* 150*  ALT 36 159* 270*  ALKPHOS 145* 143* 180*  BILITOT <0.2 0.2 <0.2  PROT 7.3 6.0* 6.1*  ALBUMIN 4.5 4.0 3.9   No results for input(s): LIPASE, AMYLASE in the last 168 hours. No results for input(s): AMMONIA in the last 168 hours. Coagulation Profile: No results for input(s): INR, PROTIME in the last 168 hours. Antimicrobials/Microbiology: Anti-infectives (From admission, onward)    Start     Dose/Rate Route Frequency Ordered Stop   08/28/24 1015  valACYclovir  (VALTREX ) tablet 1,000 mg        1,000 mg Oral 3 times daily 08/28/24 0916 09/07/24 0959   08/27/24 1515  fluconazole  (DIFLUCAN ) tablet 200 mg        200 mg Oral Daily 08/27/24 1419     08/26/24 1700  acyclovir  (ZOVIRAX ) 650 mg in dextrose  5 % 100 mL IVPB  Status:  Discontinued        10 mg/kg  64.9 kg 113 mL/hr over 60 Minutes Intravenous Every 8 hours 08/26/24 1635 08/27/24 1351      No results found for: SDES, SPECREQUEST, CULT, REPTSTATUS  Procedures:    Mennie LAMY, MD [facility] 08/28/2024, 11:18 AM   "

## 2024-08-28 NOTE — Plan of Care (Signed)

## 2024-08-29 ENCOUNTER — Other Ambulatory Visit (HOSPITAL_COMMUNITY): Payer: Self-pay

## 2024-08-29 DIAGNOSIS — B028 Zoster with other complications: Secondary | ICD-10-CM | POA: Diagnosis not present

## 2024-08-29 LAB — COMPREHENSIVE METABOLIC PANEL WITH GFR
ALT: 205 U/L — ABNORMAL HIGH (ref 0–44)
AST: 65 U/L — ABNORMAL HIGH (ref 15–41)
Albumin: 4 g/dL (ref 3.5–5.0)
Alkaline Phosphatase: 187 U/L — ABNORMAL HIGH (ref 38–126)
Anion gap: 10 (ref 5–15)
BUN: 17 mg/dL (ref 6–20)
CO2: 24 mmol/L (ref 22–32)
Calcium: 9.2 mg/dL (ref 8.9–10.3)
Chloride: 104 mmol/L (ref 98–111)
Creatinine, Ser: 0.67 mg/dL (ref 0.44–1.00)
GFR, Estimated: >60 mL/min
Glucose, Bld: 171 mg/dL — ABNORMAL HIGH (ref 70–99)
Potassium: 4.6 mmol/L (ref 3.5–5.1)
Sodium: 138 mmol/L (ref 135–145)
Total Bilirubin: <0.2 mg/dL (ref 0.0–1.2)
Total Protein: 6.5 g/dL (ref 6.5–8.1)

## 2024-08-29 LAB — GLUCOSE, CAPILLARY
Glucose-Capillary: 221 mg/dL — ABNORMAL HIGH (ref 70–99)
Glucose-Capillary: 158 mg/dL — ABNORMAL HIGH (ref 70–99)
Glucose-Capillary: 90 mg/dL (ref 70–99)
Glucose-Capillary: 134 mg/dL — ABNORMAL HIGH (ref 70–99)

## 2024-08-29 MED ORDER — FLUCONAZOLE 200 MG PO TABS
200.0000 mg | ORAL_TABLET | Freq: Every day | ORAL | 0 refills | Status: AC
  Filled 2024-08-29: qty 5, 5d supply, fill #0

## 2024-08-29 MED ORDER — HYDROCODONE-ACETAMINOPHEN 10-325 MG PO TABS: ORAL_TABLET | ORAL | Status: AC

## 2024-08-29 MED ORDER — HYDROXYZINE HCL 10 MG PO TABS
10.0000 mg | ORAL_TABLET | Freq: Three times a day (TID) | ORAL | 0 refills | Status: AC | PRN
  Filled 2024-08-29: qty 30, 10d supply, fill #0

## 2024-08-29 NOTE — Progress Notes (Signed)
 Patient's husband is not going to come so he couldn't bring Ozempic which patient takes every Sunday.  Patient said she will take it tomorrow.

## 2024-08-29 NOTE — Progress Notes (Signed)
 TOC pharmacy medications given to the patient.

## 2024-08-29 NOTE — Progress Notes (Signed)
 " PROGRESS NOTE Shannon French  FMW:[id number] DOB: 08-May-1966 DOA: 08/26/2024 PCP: Sabas Norleen PARAS., MD  Brief Narrative/Hospital Course: Shannon French is a 59 y.o. female with PMH of shingles, diabetes,hypothyroidism, essential hypertension, hyperlipidemia presenting with worsening rash despite starting Valtrex  from PCP.  Reports rash started on Wednesday and was seen by PCP on Thursday and given Valtrex  . Her rash is affecting her neck scalp and upper back. She felt this as her shingles and it has progressed. Patient also has bilateral inguinal rash patient has that has not improved with over-the-counter topical antifungal.  No other reports of fevers chills nausea vomiting abdominal pain headaches blurred vision speech or gait issues numbness or tingling.  Patient reports the rash started on Wednesday and she was seen by primary care on Thursday and was given Valtrex   Patient decided to come to the hospital as it was getting worse. In the ED vitals stable, hypertensive, stable electrolytes normal LFTs, A1c 7.9. Patient was felt to have multi dermatomal herpes zoster, placed in airborne contact precaution, IV acyclovir  and admitted for further management. Overall rash improving and crusting, seen by ID and advised to complete Valtrex   PO At this time.  For discharge home  Subjective: Seen and examined Overall having ongoing pain issues but no new complaints Overnight afebrile, vital stable on room air Labs overall stable AST/ALT much better at 65/205 from 25/36> 183/159> 150/270 TB normal. ALP 187 CBC stable  Assessment and plan:   Herpetiform rash in cervical dermatome with intense pain consistent with varicella-zoster Possible autoinoculation with 1 lesion in thigh:  Rash appears improving although continues to have pain and sensitivity.ID input appreciated-complete Valtrex  x10 days total. Total continue pain management-opiates known opiates.  She is chronically on pain management w/ norco, muscle  relaxant Continue Atarax  for symptomatic relief. Advised to continue with 10-25 p.o. hydrocodone  for pain relief for 2 days then go back to home dose of 5-325mg   Intertrigo: Cont Diflucan  daily and topical nystatin   T2DM W/ Uncontrolled hyperglycemia: A1c stable.  PTA on Lantus  30 units and SSI, Ozempic, currently fairly controlled on sliding scale insulin , and lantus  Recent Labs  Lab 08/26/24 1206 08/26/24 2233 08/28/24 0721 08/28/24 1115 08/28/24 1658 08/28/24 1947 08/29/24 0817  GLUCAP  --    < > 140* 142* 144* 187* 221*  HGBA1C 7.9*  --   --   --   --   --   --    < > = values in this interval not displayed.   Transaminitis: LFTs bumped since admission, AST/ALT much better at 65/205 from 25/36> 183/159> 150/270 TB normal. ALP 187 Acute viral hepatitis panel negative, ultrasound shows hepatic steatosis no focal liver lesion, gallbladder surgically absent. Follow-up outpatient with PCP avoid hepatotoxic medication alcohol  Recent Labs  Lab 08/26/24 1206 08/27/24 0433 08/28/24 0657 08/29/24 0642  AST 25 183* 150* 65*  ALT 36 159* 270* 205*  ALKPHOS 145* 143* 180* 187*  BILITOT <0.2 0.2 <0.2 <0.2  PROT 7.3 6.0* 6.1* 6.5  ALBUMIN 4.5 4.0 3.9 4.0  PLT 315 264 252  --    History of plaque psoriasis: not on immunosuppression.  Hypertension : BP stable continue lisinopril .    Hyperlipidemia: Continue Zetia .On Repatha outpatient.  Chronic pain PTA on on amitriptyline , Tegretol  Cymbalta , tizanidine . cont same  DVT prophylaxis: heparin  injection 5,000 Units Start: 08/26/24 2200 Code Status:   Code Status: Full Code Family Communication: plan of care discussed with patient and her husband at bedside. Patient status is: Remains hospitalized  because of severity of illness Level of care: Telemetry   Dispo: The patient is from: home            Anticipated disposition: home soon.  She is unable to get from home today due to bad weather Objective: Vitals last 24  hrs: Vitals:   08/28/24 0719 08/28/24 1906 08/29/24 0553 08/29/24 0820  BP: (!) 156/93 134/82 108/79 116/83  Pulse: 75 78 78 80  Resp: 16 17 18 18   Temp: 97.9 F (36.6 C) 98.5 F (36.9 C) 97.8 F (36.6 C)   TempSrc:  Oral    SpO2: 98% 95% 95% 94%  Weight:      Height:        Physical Examination: General exam: AAOX3.NAD HEENT:Oral mucosa moist, Ear/Nose WNL grossly Respiratory system: Bilaterally clear BS,no use of accessory muscle Cardiovascular system: S1 & S2 +, No JVD. Gastrointestinal system: Abdomen soft,NT,ND, BS+ Nervous System: Alert, awake, moving all extremities,and following commands. Extremities: extremities warm, leg edema Skin: Warm, healing rash on uppre neck posterior/scalp, bilateral groin rash improving MSK: Normal muscle bulk,tone, power   Unresulted Labs (From admission, onward)    None      Data Reviewed: I have personally reviewed following labs and imaging studies ( see epic result tab) CBC: Recent Labs  Lab 08/26/24 1206 08/27/24 0433 08/28/24 0657  WBC 10.7* 8.0 5.2  HGB 13.7 12.0 12.1  HCT 43.3 38.3 38.2  MCV 84.9 84.4 84.1  PLT 315 264 252   CMP: Recent Labs  Lab 08/26/24 1206 08/27/24 0433 08/28/24 0657 08/29/24 0642  NA 138 141 140 138  K 4.2 3.8 4.0 4.6  CL 101 107 106 104  CO2 22 24 25 24   GLUCOSE 191* 121* 119* 171*  BUN 15 12 8 17   CREATININE 0.73 0.67 0.61 0.67  CALCIUM 9.4 8.8* 9.0 9.2   GFR: Estimated Creatinine Clearance: 68 mL/min (by C-G formula based on SCr of 0.67 mg/dL). Recent Labs  Lab 08/26/24 1206 08/27/24 0433 08/28/24 0657 08/29/24 0642  AST 25 183* 150* 65*  ALT 36 159* 270* 205*  ALKPHOS 145* 143* 180* 187*  BILITOT <0.2 0.2 <0.2 <0.2  PROT 7.3 6.0* 6.1* 6.5  ALBUMIN 4.5 4.0 3.9 4.0   No results for input(s): LIPASE, AMYLASE in the last 168 hours. No results for input(s): AMMONIA in the last 168 hours. Coagulation Profile: No results for input(s): INR, PROTIME in the last 168  hours. Antimicrobials/Microbiology: Anti-infectives (From admission, onward)    Start     Dose/Rate Route Frequency Ordered Stop   08/29/24 0000  fluconazole  (DIFLUCAN ) 200 MG tablet        200 mg Oral Daily 08/29/24 1027 09/03/24 2359   08/28/24 1015  valACYclovir  (VALTREX ) tablet 1,000 mg        1,000 mg Oral 3 times daily 08/28/24 0916 09/07/24 0959   08/27/24 1515  fluconazole  (DIFLUCAN ) tablet 200 mg        200 mg Oral Daily 08/27/24 1419     08/26/24 1700  acyclovir  (ZOVIRAX ) 650 mg in dextrose  5 % 100 mL IVPB  Status:  Discontinued        10 mg/kg  64.9 kg 113 mL/hr over 60 Minutes Intravenous Every 8 hours 08/26/24 1635 08/27/24 1351      No results found for: SDES, SPECREQUEST, CULT, REPTSTATUS  Procedures:    Mennie LAMY, MD [facility] 08/29/2024, 10:55 AM   "

## 2024-08-30 ENCOUNTER — Other Ambulatory Visit (HOSPITAL_COMMUNITY): Payer: Self-pay

## 2024-08-30 ENCOUNTER — Encounter (HOSPITAL_COMMUNITY): Payer: Self-pay | Admitting: Internal Medicine

## 2024-08-30 LAB — GLUCOSE, CAPILLARY: Glucose-Capillary: 97 mg/dL (ref 70–99)

## 2024-08-30 MED ORDER — ORAL CARE MOUTH RINSE: 15.0000 mL | OROMUCOSAL | Status: DC | PRN

## 2024-08-30 NOTE — Discharge Summary (Signed)
 Physician Discharge Summary  Shannon French FMW:[id number] DOB: 1965-09-21 DOA: 08/26/2024  PCP: Shannon Norleen PARAS., MD  Admit date: 08/26/2024 Discharge date: 08/30/2024 Recommendations for Outpatient Follow-up:  Follow up with PCP in 1 weeks-call for appointment Please obtain BMP/CBC in one week  Discharge Dispo: home Discharge Condition: Stable Code Status:   Code Status: Full Code Diet recommendation:  Diet Order             Diet Carb Modified Room service appropriate? Yes  Diet effective now                    Brief/Interim Summary: Shannon French is a 59 y.o. female with PMH of shingles, diabetes,hypothyroidism, essential hypertension, hyperlipidemia presenting with worsening rash despite starting Valtrex  from PCP.  Reports rash started on Wednesday and was seen by PCP on Thursday and given Valtrex  . Her rash is affecting her neck scalp and upper back. She felt this as her shingles and it has progressed. Patient also has bilateral inguinal rash patient has that has not improved with over-the-counter topical antifungal.  No other reports of fevers chills nausea vomiting abdominal pain headaches blurred vision speech or gait issues numbness or tingling.  Patient reports the rash started on Wednesday and she was seen by primary care on Thursday and was given Valtrex   Patient decided to come to the hospital as it was getting worse. In the ED vitals stable, hypertensive, stable electrolytes normal LFTs, A1c 7.9. Patient was felt to have multi dermatomal herpes zoster, placed in airborne contact precaution, IV acyclovir  and admitted for further management. Overall rash improving and crusting, seen by ID and advised to complete Valtrex   PO   Subjective: Seen and examined Feels well pain much improved. Open remains afebrile vital stable blood sugar controlled  Discharge Diagnoses:   Herpetiform rash in cervical dermatome with intense pain consistent with varicella-zoster Possible  autoinoculation with 1 lesion in thigh:  Rash appears improving although continues to have pain and sensitivity.ID input appreciated-complete Valtrex  x10 days total. Total continue pain management-opiates known opiates.  She is chronically on pain management w/ norco, muscle relaxant Continue Atarax  for symptomatic relief. Advised to continue with 10-25 p.o. hydrocodone  for pain relief for 2 days then go back to home dose of 5-325mg -up with her pain management clinic  Intertrigo: Cont Diflucan  daily and topical nystatin  and complete the course  T2DM W/ Uncontrolled hyperglycemia: A1c stable.  PTA on Lantus  30 units and SSI, Ozempic, blood sugar remains well-controlled on current regimen and ssi. Recent Labs  Lab 08/26/24 1206 08/26/24 2233 08/29/24 0817 08/29/24 1155 08/29/24 1537 08/29/24 2029 08/30/24 0740  GLUCAP  --    < > 221* 158* 90 134* 97  HGBA1C 7.9*  --   --   --   --   --   --    < > = values in this interval not displayed.   Transaminitis Fatty liver: LFTs bumped since admission, AST/ALT much better at 65/205 from 25/36> 183/159> 150/270 TB normal. ALP 187 Acute viral hepatitis panel negative, ultrasound shows hepatic steatosis no focal liver lesion, gallbladder surgically absent. Follow-up outpatient with PCP avoid hepatotoxic medication alcohol  Recent Labs  Lab 08/26/24 1206 08/27/24 0433 08/28/24 0657 08/29/24 0642  AST 25 183* 150* 65*  ALT 36 159* 270* 205*  ALKPHOS 145* 143* 180* 187*  BILITOT <0.2 0.2 <0.2 <0.2  PROT 7.3 6.0* 6.1* 6.5  ALBUMIN 4.5 4.0 3.9 4.0  PLT 315 264 252  --  History of plaque psoriasis: not on immunosuppression.  Hypertension : BP stable continue lisinopril .    Hyperlipidemia: Continue Zetia .On Repatha outpatient.  Chronic pain PTA on on amitriptyline , Tegretol  Cymbalta , tizanidine . cont same  DVT prophylaxis: heparin  injection 5,000 Units Start: 08/26/24 2200 Code Status:   Code Status: Full Code Family  Communication: plan of care discussed with patient and her husband at bedside. Patient status is: Remains hospitalized because of severity of illness Level of care: Med-Surg   Dispo: The patient is from: home            Anticipated disposition: home today  Objective: Vitals last 24 hrs: Vitals:   08/29/24 0553 08/29/24 0820 08/29/24 2025 08/30/24 0807  BP: 108/79 116/83 (!) 132/90 123/86  Pulse: 78 80 79 86  Resp: 18 18 17 18   Temp: 97.8 F (36.6 C)  98.2 F (36.8 C)   TempSrc:   Oral   SpO2: 95% 94% 94% 96%  Weight:      Height:       Physical Examination: General exam: AAOX3.NAD HEENT:Oral mucosa moist, Ear/Nose WNL grossly Respiratory system: Bilaterally clear BS,no use of accessory muscle Cardiovascular system: S1 & S2 +, No JVD. Gastrointestinal system: Abdomen soft,NT,ND, BS+ Nervous System: Alert, awake, moving all extremities,and following commands. Extremities: extremities warm, leg edema Skin: Warm, rash improved on groin and upper cervical area posteriorly  MSK: Normal muscle bulk,tone, power     Consultation: See note.  Discharge Instructions  Discharge Instructions     Discharge instructions   Complete by: As directed    follow up w/ liver function test with PCP I n1 week  Please call call MD or return to ER for similar or worsening recurring problem that brought you to hospital or if any fever,nausea/vomiting,abdominal pain, uncontrolled pain, chest pain,  shortness of breath or any other alarming symptoms.  Please follow-up your doctor as instructed in a week time and call the office for appointment.  Please avoid alcohol , smoking, or any other illicit substance and maintain healthy habits including taking your regular medications as prescribed.  You were cared for by a hospitalist during your hospital stay. If you have any questions about your discharge medications or the care you received while you were in the hospital after you are discharged, you  can call the unit and ask to speak with the hospitalist on call if the hospitalist that took care of you is not available.  Once you are discharged, your primary care physician will handle any further medical issues. Please note that NO REFILLS for any discharge medications will be authorized once you are discharged, as it is imperative that you return to your primary care physician (or establish a relationship with a primary care physician if you do not have one) for your aftercare needs so that they can reassess your need for medications and monitor your lab values   Increase activity slowly   Complete by: As directed       Allergies as of 08/30/2024       Reactions   Blue Dyes (parenteral) Anaphylaxis   Gabapentin  [gabapentin ] Swelling   Suspects allergy was to dye; capsule was yellow   Actos [pioglitazone Hydrochloride] Other (See Comments), Hypertension   HEADACHES   Bee Venom Other (See Comments)   Esomeprazole Magnesium Hives   Glucophage [metformin Hydrochloride] Hives, Swelling   Iodinated Contrast Media    Other reaction(s): Other (See Comments) Unknown   Iohexol  Other (See Comments)   Mepivacaine Hcl Hives, Itching  carbocaine Other reaction(s): Unknown   Morphine Other (See Comments)   Other Other (See Comments)   Uncoded Allergy. Allergen: BICARBOCAINE XRAY DYE - THROAT SWELLS   Pioglitazone Other (See Comments)   Other reaction(s): irregular heart rate, Unknown   Sulfa Antibiotics Hives   Other reaction(s): Unknown        Medication List     STOP taking these medications    folic acid 1 MG tablet Commonly known as: FOLVITE   methimazole 5 MG tablet Commonly known as: TAPAZOLE       TAKE these medications    ALBUTEROL  IN albuterol  90 mcg-budesonide 80 mcg/actuation HFA aerosol inhaler  Inhale by inhalation route.   amitriptyline  50 MG tablet Commonly known as: ELAVIL  Take 1 tablet (50 mg total) by mouth at bedtime.   carbamazepine  200 MG  tablet Commonly known as: TEGRETOL  TAKE 1 TABLET BY MOUTH TWICE A DAY   clobetasol ointment 0.05 % Commonly known as: TEMOVATE Apply 1 application topically 2 (two) times daily.   DHEA 25 MG Caps Take 25 mg by mouth daily.   diclofenac  75 MG EC tablet Commonly known as: VOLTAREN  Take 75 mg by mouth daily.   diclofenac  Sodium 1 % Gel Commonly known as: VOLTAREN  Apply 2 g topically daily as needed (pain and itching).   diphenhydrAMINE  25 mg capsule Commonly known as: BENADRYL  Take 25 mg by mouth every 8 (eight) hours as needed for itching.   DULoxetine  60 MG capsule Commonly known as: CYMBALTA  TAKE 1 CAPSULE BY MOUTH EVERY DAY What changed:  how much to take additional instructions   DULoxetine  30 MG capsule Commonly known as: CYMBALTA  TAKE 1 CAPSULE BY MOUTH EVERY DAY What changed:  how much to take when to take this   ezetimibe  10 MG tablet Commonly known as: ZETIA  TAKE 1 TABLET BY MOUTH EVERY DAY   fluconazole  200 MG tablet Commonly known as: DIFLUCAN  Take 1 tablet (200 mg total) by mouth daily for 5 days.   GLUCAGON HCL IJ as needed.   HYDROcodone -acetaminophen  10-325 MG tablet Commonly known as: NORCO CAN TAKE 10-325 MG TID PRN X 2 DAYS then go back to 0.5 mg home dose What changed: additional instructions   hydrOXYzine  10 MG tablet Commonly known as: ATARAX  Take 1 tablet (10 mg total) by mouth 3 (three) times daily as needed for itching.   IMVEXXY VA Place 1 Insert vaginally once a week. Used once a week.   insulin  lispro 100 UNIT/ML injection Commonly known as: HUMALOG Inject 3-4 Units into the skin daily as needed for high blood sugar.   ketoconazole 2 % cream Commonly known as: NIZORAL Apply 1 Application topically 2 (two) times daily.   Lantus  SoloStar 100 UNIT/ML Solostar Pen Generic drug: insulin  glargine Inject 30 Units into the skin every morning.   lidocaine  5 % Commonly known as: LIDODERM  Place 1 patch onto the skin daily. Remove  & Discard patch within 12 hours or as directed by MD   lisinopril  2.5 MG tablet Commonly known as: ZESTRIL  Take 1 tablet (2.5 mg total) by mouth daily. Please schedule office visit to continue receiving refills. Thank you! Office number: [phone]   naloxone  4 MG/0.1ML Liqd nasal spray kit Commonly known as: NARCAN  Lethargic or respiratory depression after opoid use What changed:  how much to take how to take this when to take this reasons to take this   Ozempic (0.25 or 0.5 MG/DOSE) 2 MG/3ML Sopn Generic drug: Semaglutide(0.25 or 0.5MG /DOS) Inject 1 mg into  the muscle once a week. What changed: Another medication with the same name was removed. Continue taking this medication, and follow the directions you see here.   pantoprazole  40 MG tablet Commonly known as: PROTONIX  Take 40 mg by mouth daily.   ProAir  HFA 108 (90 Base) MCG/ACT inhaler Generic drug: albuterol  Inhale 2 puffs into the lungs 4 (four) times daily as needed.   PROBIOTIC-10 PO Take 1 tablet by mouth daily.   promethazine 25 MG tablet Commonly known as: PHENERGAN Take 25 mg by mouth every 6 (six) hours as needed.   Repatha SureClick 140 MG/ML Soaj Generic drug: Evolocumab Inject into the skin every 14 (fourteen) days.   risedronate 150 MG tablet Commonly known as: ACTONEL Take 150 mg by mouth every 30 (thirty) days. with water on empty stomach, nothing by mouth or lie down for next 30 minutes.   SUNSCREEN SPF30 EX Apply 1 application  topically daily.   tiZANidine  4 MG tablet Commonly known as: ZANAFLEX  TAKE 1 TABLET (4 MG TOTAL) BY MOUTH EVERY 8 (EIGHT) HOURS AS NEEDED FOR MUSCLE SPASMS What changed: when to take this   valACYclovir  1000 MG tablet Commonly known as: VALTREX  Take 1,000 mg by mouth 3 (three) times daily.        Follow-up Information     Slatosky, Norleen PARAS., MD Follow up in 1 week(s).   Specialty: Family Medicine Contact information: [address] [place]  [id number] [phone]                Allergies[1]  The results of significant diagnostics from this hospitalization (including imaging, microbiology, ancillary and laboratory) are listed below for reference.    Microbiology: No results found for this or any previous visit (from the past 240 hours).  Procedures/Studies: US  ABDOMEN LIMITED RUQ (LIVER/GB) Result Date: 08/28/2024 EXAM: Right Upper Quadrant Abdominal Ultrasound 08/28/2024 08:43:02 PM TECHNIQUE: Real-time ultrasonography of the right upper quadrant of the abdomen was performed. COMPARISON: Comparison with 12/18/2020. CLINICAL HISTORY: Transaminitis. FINDINGS: LIVER: Diffusely increased liver parenchymal echotexture suggesting diffuse fatty infiltration. No focal liver lesions identified. No intrahepatic biliary ductal dilatation. Hepatopetal flow in the portal vein. BILIARY SYSTEM: Gallbladder is surgically absent. Normal caliber extrahepatic bile ducts with common bile duct diameter measuring 6 mm. RIGHT KIDNEY: Limited images of the right kidney demonstrate no hydronephrosis. No echogenic calculi. No mass. PANCREAS: The pancreas is obscured by overlying bowel gas. OTHER: No right upper quadrant ascites. IMPRESSION: 1. Hepatic steatosis. No focal liver lesions identified. 2. Gallbladder surgically absent. Electronically signed by: Elsie Gravely MD 08/28/2024 08:47 PM EST RP Workstation: [id number]    Labs: BNP (last 3 results) No results for input(s): BNP in the last 8760 hours. Basic Metabolic Panel: Recent Labs  Lab 08/26/24 1206 08/27/24 0433 08/28/24 0657 08/29/24 0642  NA 138 141 140 138  K 4.2 3.8 4.0 4.6  CL 101 107 106 104  CO2 22 24 25 24   GLUCOSE 191* 121* 119* 171*  BUN 15 12 8 17   CREATININE 0.73 0.67 0.61 0.67  CALCIUM 9.4 8.8* 9.0 9.2   Liver Function Tests: Recent Labs  Lab 08/26/24 1206 08/27/24 0433 08/28/24 0657 08/29/24 0642  AST 25 183* 150* 65*  ALT 36 159* 270* 205*  ALKPHOS 145*  143* 180* 187*  BILITOT <0.2 0.2 <0.2 <0.2  PROT 7.3 6.0* 6.1* 6.5  ALBUMIN 4.5 4.0 3.9 4.0   No results for input(s): LIPASE, AMYLASE in the last 168 hours. No results for input(s): AMMONIA in  the last 168 hours. CBC: Recent Labs  Lab 08/26/24 1206 08/27/24 0433 08/28/24 0657  WBC 10.7* 8.0 5.2  HGB 13.7 12.0 12.1  HCT 43.3 38.3 38.2  MCV 84.9 84.4 84.1  PLT 315 264 252   CBG: Recent Labs  Lab 08/29/24 0817 08/29/24 1155 08/29/24 1537 08/29/24 2029 08/30/24 0740  GLUCAP 221* 158* 90 134* 97   Hgb A1c No results for input(s): HGBA1C in the last 72 hours. Anemia work up No results for input(s): VITAMINB12, FOLATE, FERRITIN, TIBC, IRON, RETICCTPCT in the last 72 hours.  Cardiac Enzymes: No results for input(s): CKTOTAL, CKMB, CKMBINDEX, TROPONINI in the last 168 hours. BNP: Invalid input(s): POCBNP D-Dimer No results for input(s): DDIMER in the last 72 hours. Lipid Profile No results for input(s): CHOL, HDL, LDLCALC, TRIG, CHOLHDL, LDLDIRECT in the last 72 hours. Thyroid function studies No results for input(s): TSH, T4TOTAL, T3FREE, THYROIDAB in the last 72 hours.  Invalid input(s): FREET3 Urinalysis No results found for: COLORURINE, APPEARANCEUR, LABSPEC, PHURINE, GLUCOSEU, HGBUR, BILIRUBINUR, KETONESUR, PROTEINUR, UROBILINOGEN, NITRITE, LEUKOCYTESUR Sepsis Labs Recent Labs  Lab 08/26/24 1206 08/27/24 0433 08/28/24 0657  WBC 10.7* 8.0 5.2   Microbiology No results found for this or any previous visit (from the past 240 hours).   Time coordinating discharge: 35 minutes  SIGNED: Mennie LAMY, MD  [facility] 08/30/2024, 9:34 AM  If 7PM-7AM, please contact night-coverage [email or web address]       [1]  Allergies Allergen Reactions   Blue Dyes (Parenteral) Anaphylaxis   Gabapentin  [Gabapentin ] Swelling    Suspects allergy was to dye; capsule was yellow   Actos  [Pioglitazone Hydrochloride] Other (See Comments) and Hypertension    HEADACHES   Bee Venom Other (See Comments)   Esomeprazole Magnesium Hives   Glucophage [Metformin Hydrochloride] Hives and Swelling   Iodinated Contrast Media     Other reaction(s): Other (See Comments) Unknown   Iohexol  Other (See Comments)   Mepivacaine Hcl Hives and Itching    carbocaine Other reaction(s): Unknown   Morphine Other (See Comments)   Other Other (See Comments)    Uncoded Allergy. Allergen: BICARBOCAINE XRAY DYE - THROAT SWELLS   Pioglitazone Other (See Comments)    Other reaction(s): irregular heart rate, Unknown   Sulfa Antibiotics Hives    Other reaction(s): Unknown

## 2024-08-30 NOTE — TOC Transition Note (Signed)
 Transition of Care [facility]) - Discharge Note   Patient Details  Name: Shannon French MRN: [id number] Date of Birth: 17-Jun-1966  [facility] CM/SW Contact:  Tom-Johnson, Yee Gangi Daphne, RN Phone Number: 08/30/2024, 9:52 AM   Clinical Narrative:     Patient is scheduled for discharge today.  Readmission Risk Assessment done. Hospital f/u and discharge instructions on AVS. Prescriptions sent to [facility] pharmacy and patient will receive meds prior discharge. No ICM needs or recommendations noted. Husband, Merilee will transport at discharge.  No further ICM needs noted.       Final next level of care: Home/Self Care Barriers to Discharge: Barriers Resolved   Patient Goals and CMS Choice Patient states their goals for this hospitalization and ongoing recovery are:: To return home CMS Medicare.gov Compare Post Acute Care list provided to:: Patient Choice offered to / list presented to : NA      Discharge Placement                Patient to be transferred to facility by: Husband Name of family member notified: [facility] Additional resources added to the After Visit Summary for                  DME Arranged: N/A DME Agency: NA       HH Arranged: NA HH Agency: NA        Social Drivers of Health (SDOH) Interventions SDOH Screenings   Food Insecurity: No Food Insecurity (08/27/2024)  Housing: Unknown (08/27/2024)  Transportation Needs: No Transportation Needs (08/27/2024)  Utilities: Not At Risk (08/27/2024)  Depression (PHQ2-9): Low Risk (08/18/2024)  Tobacco Use: Medium Risk (08/30/2024)     Readmission Risk Interventions    08/30/2024    9:51 AM  Readmission Risk Prevention Plan  Post Dischage Appt Complete  Medication Screening Complete  Transportation Screening Complete

## 2024-09-03 ENCOUNTER — Telehealth: Payer: Self-pay | Admitting: Cardiovascular Disease

## 2024-09-03 NOTE — Telephone Encounter (Signed)
" °*  STAT* If patient is at the pharmacy, call can be transferred to refill team.   1. Which medications need to be refilled? (please list name of each medication and dose if known) lisinopril  (ZESTRIL ) 2.5 MG tablet    2. Would you like to learn more about the convenience, safety, & potential cost savings by using the [facility]? No    3. Are you open to using the [facility] (Type [facility].  ). No   4. Which pharmacy/location (including street and city if local pharmacy) is medication to be sent to?[id number] - [place], [place] - [address]    5. Do they need a 30 day or 90 day supply? 90   Pt is currently out and has an upcoming appt  "

## 2024-09-06 MED ORDER — LISINOPRIL 2.5 MG PO TABS: 2.5000 mg | ORAL_TABLET | Freq: Every day | ORAL | 0 refills | Status: AC

## 2024-09-06 NOTE — Telephone Encounter (Signed)
 Pt scheduled 11/04/24, will send in enough until appointment.

## 2024-10-18 ENCOUNTER — Encounter: Admitting: Registered Nurse

## 2024-11-04 ENCOUNTER — Ambulatory Visit: Admitting: Cardiovascular Disease
